# Patient Record
Sex: Female | Born: 1955 | Race: White | Hispanic: No | Marital: Single
Health system: Midwestern US, Community
[De-identification: ages and names within clinical notes are randomized; demographics above are authoritative.]

## PROBLEM LIST (undated history)

## (undated) ENCOUNTER — Emergency Department (HOSPITAL_COMMUNITY)
Admission: EM | Discharge status: Absent without leave | Payer: Self-pay | Attending: Emergency Medicine | Admitting: Emergency Medicine

## (undated) DIAGNOSIS — Z9889 Other specified postprocedural states: Secondary | ICD-10-CM

## (undated) DIAGNOSIS — I8390 Asymptomatic varicose veins of unspecified lower extremity: Secondary | ICD-10-CM

## (undated) DIAGNOSIS — I2699 Other pulmonary embolism without acute cor pulmonale: Secondary | ICD-10-CM

## (undated) DIAGNOSIS — Z8719 Personal history of other diseases of the digestive system: Secondary | ICD-10-CM

## (undated) DIAGNOSIS — M1990 Unspecified osteoarthritis, unspecified site: Secondary | ICD-10-CM

## (undated) DIAGNOSIS — E559 Vitamin D deficiency, unspecified: Secondary | ICD-10-CM

## (undated) DIAGNOSIS — D6862 Lupus anticoagulant syndrome: Secondary | ICD-10-CM

## (undated) DIAGNOSIS — J45909 Unspecified asthma, uncomplicated: Secondary | ICD-10-CM

## (undated) DIAGNOSIS — I10 Essential (primary) hypertension: Secondary | ICD-10-CM

## (undated) DIAGNOSIS — E079 Disorder of thyroid, unspecified: Secondary | ICD-10-CM

## (undated) DIAGNOSIS — I82409 Acute embolism and thrombosis of unspecified deep veins of unspecified lower extremity: Secondary | ICD-10-CM

## (undated) DIAGNOSIS — D649 Anemia, unspecified: Secondary | ICD-10-CM

## (undated) DIAGNOSIS — K219 Gastro-esophageal reflux disease without esophagitis: Secondary | ICD-10-CM

## (undated) DIAGNOSIS — I739 Peripheral vascular disease, unspecified: Secondary | ICD-10-CM

## (undated) DIAGNOSIS — G709 Myoneural disorder, unspecified: Secondary | ICD-10-CM

## (undated) DIAGNOSIS — M533 Sacrococcygeal disorders, not elsewhere classified: Secondary | ICD-10-CM

## (undated) DIAGNOSIS — K439 Ventral hernia without obstruction or gangrene: Secondary | ICD-10-CM

## (undated) DIAGNOSIS — E039 Hypothyroidism, unspecified: Secondary | ICD-10-CM

## (undated) DIAGNOSIS — F419 Anxiety disorder, unspecified: Secondary | ICD-10-CM

## (undated) DIAGNOSIS — G8929 Other chronic pain: Secondary | ICD-10-CM

## (undated) DIAGNOSIS — G4730 Sleep apnea, unspecified: Secondary | ICD-10-CM

## (undated) DIAGNOSIS — M549 Dorsalgia, unspecified: Secondary | ICD-10-CM

## (undated) DIAGNOSIS — C55 Malignant neoplasm of uterus, part unspecified: Secondary | ICD-10-CM

## (undated) DIAGNOSIS — R928 Other abnormal and inconclusive findings on diagnostic imaging of breast: Secondary | ICD-10-CM

## (undated) DIAGNOSIS — R921 Mammographic calcification found on diagnostic imaging of breast: Secondary | ICD-10-CM

## (undated) DIAGNOSIS — Z1239 Encounter for other screening for malignant neoplasm of breast: Secondary | ICD-10-CM

## (undated) HISTORY — PX: ESOPHAGOGASTRODUODENOSCOPY (EGD) WITH ESOPHAGEAL DILATION: SHX5812

## (undated) HISTORY — PX: APPENDECTOMY: SHX54

## (undated) HISTORY — DX: Asymptomatic varicose veins of unspecified lower extremity: I83.90

## (undated) HISTORY — PX: JOINT REPLACEMENT: SHX530

## (undated) HISTORY — PX: DILATION AND CURETTAGE OF UTERUS: SHX78

## (undated) HISTORY — PX: BUNIONECTOMY WITH HAMMERTOE RECONSTRUCTION: SHX5600

## (undated) HISTORY — PX: TONSILLECTOMY: SUR1361

---

## 2003-03-23 ENCOUNTER — Encounter: Admission: RE | Admit: 2003-03-23 | Discharge: 2003-03-23 | Payer: Self-pay | Admitting: Family Medicine

## 2003-03-23 ENCOUNTER — Encounter: Payer: Self-pay | Admitting: Family Medicine

## 2003-05-13 ENCOUNTER — Ambulatory Visit (HOSPITAL_BASED_OUTPATIENT_CLINIC_OR_DEPARTMENT_OTHER): Admission: RE | Admit: 2003-05-13 | Discharge: 2003-05-13 | Payer: Self-pay | Admitting: Otolaryngology

## 2005-01-08 ENCOUNTER — Encounter: Admission: RE | Admit: 2005-01-08 | Discharge: 2005-04-08 | Payer: Self-pay | Admitting: Family Medicine

## 2005-01-13 ENCOUNTER — Ambulatory Visit (HOSPITAL_COMMUNITY): Admission: RE | Admit: 2005-01-13 | Discharge: 2005-01-13 | Payer: Self-pay | Admitting: Family Medicine

## 2005-02-12 ENCOUNTER — Ambulatory Visit: Payer: Self-pay | Admitting: Gastroenterology

## 2005-03-09 ENCOUNTER — Ambulatory Visit: Payer: Self-pay | Admitting: Gastroenterology

## 2005-03-13 ENCOUNTER — Ambulatory Visit: Payer: Self-pay | Admitting: Gastroenterology

## 2005-09-29 ENCOUNTER — Emergency Department (HOSPITAL_COMMUNITY): Admission: EM | Admit: 2005-09-29 | Discharge: 2005-09-29 | Payer: Self-pay | Admitting: Emergency Medicine

## 2006-03-18 ENCOUNTER — Ambulatory Visit (HOSPITAL_COMMUNITY): Admission: RE | Admit: 2006-03-18 | Discharge: 2006-03-18 | Payer: Self-pay | Admitting: Family Medicine

## 2006-03-19 ENCOUNTER — Ambulatory Visit (HOSPITAL_COMMUNITY): Admission: RE | Admit: 2006-03-19 | Discharge: 2006-03-19 | Payer: Self-pay | Admitting: Family Medicine

## 2007-04-28 ENCOUNTER — Encounter: Admission: RE | Admit: 2007-04-28 | Discharge: 2007-04-28 | Payer: Self-pay | Admitting: Family Medicine

## 2010-05-24 ENCOUNTER — Encounter

## 2010-07-03 LAB — COMPREHENSIVE METABOLIC PANEL
ALT: 26 U/L (ref 4–40)
AST: 24 U/L (ref 8–36)
Albumin/Globulin Ratio: 1.8 (ref 1.0–2.7)
Albumin: 4.8 g/dL (ref 3.4–4.8)
Alkaline Phosphatase: 79 U/L (ref 25–100)
Anion Gap: 15 mmol/L (ref 8–16)
BUN: 11 mg/dL (ref 6–20)
CO2: 26 mmol/L (ref 20–31)
Calcium: 9.6 mg/dL (ref 8.6–10.4)
Chloride: 105 mmol/L (ref 98–110)
Creatinine: 0.8 mg/dL (ref 0.4–1.0)
GFR African American: >60 mL/min (ref >60)
GFR Non-African American: >60 mL/min (ref >60)
Glucose: 84 mg/dL (ref 74–106)
Potassium: 4.1 mmol/L (ref 3.5–5.1)
Protein, Total: 7.4 g/dL (ref 6.4–8.3)
Sodium: 141 mmol/L (ref 136–145)
Total Bilirubin: 0.26 mg/dL — ABNORMAL LOW (ref 0.3–1.2)

## 2010-07-03 LAB — CBC
Hematocrit: 41.6 % (ref 36–46)
Hemoglobin: 14 g/dL (ref 12.0–16.0)
MCH: 29 pg (ref 26–34)
MCHC: 33.6 g/dL (ref 31–37)
MCV: 86.3 fL (ref 80–100)
MPV: 9.1 fL (ref 6.0–12.0)
Platelet Count: 388 10*3/uL (ref 140–450)
RBC: 4.82 m/uL (ref 4.0–5.2)
RDW: 16 % — ABNORMAL HIGH (ref 12.5–15.4)
WBC: 9.7 10*3/uL (ref 3.5–11.0)

## 2010-07-03 LAB — HEMOGLOBIN A1C
Estimated Avg Glucose: 134 mg/dL
Hemoglobin A1C: 6.3 % — ABNORMAL HIGH (ref 4.0–6.0)

## 2010-07-03 LAB — STREP A CULTURE, THROAT: Culture: NEGATIVE

## 2010-07-03 LAB — LIPID PANEL
Chol/HDL Ratio: 5.1 — ABNORMAL HIGH (ref <5.0)
Cholesterol: 228 mg/dL — ABNORMAL HIGH (ref <200)
HDL: 45 mg/dL (ref >40)
LDL Cholesterol: 146 mg/dL — ABNORMAL HIGH (ref <100)
Triglycerides: 183 mg/dL — ABNORMAL HIGH (ref <150)

## 2010-07-21 ENCOUNTER — Encounter

## 2010-07-21 MED ORDER — HYDROCODONE-ACETAMINOPHEN 5-500 MG PO TABS
5-500 MG | ORAL_TABLET | ORAL | Status: DC
Start: 2010-07-21 — End: 2010-08-04

## 2010-07-22 NOTE — Telephone Encounter (Signed)
Nurse tried to  Phone  Pt  Home  Phone  And  Number was  D"c    Pt  Has no  Other  Number  Listed   To  Contact  Rx  For  vicodin  Faxed to  [facility]  [phone]

## 2010-08-14 NOTE — Telephone Encounter (Signed)
Paper chart requested check wire basket next appt  08-19-10

## 2010-08-19 MED ORDER — HYDROCODONE-ACETAMINOPHEN 5-500 MG PO TABS
5-500 MG | ORAL_TABLET | ORAL | Status: AC
Start: 2010-08-19 — End: ?

## 2010-08-19 MED ORDER — CLONAZEPAM 1 MG PO TABS
1 MG | ORAL_TABLET | Freq: Every evening | ORAL | Status: AC | PRN
Start: 2010-08-19 — End: 2011-08-14

## 2010-08-21 NOTE — Telephone Encounter (Signed)
Rx konopin written by Dr Otis Dials and called to [facility].

## 2010-08-22 NOTE — Telephone Encounter (Signed)
Rx vicodin written by Dr Otis Dials and called to [facility] message line.

## 2011-06-15 ENCOUNTER — Inpatient Hospital Stay (INDEPENDENT_AMBULATORY_CARE_PROVIDER_SITE_OTHER)
Admission: RE | Admit: 2011-06-15 | Discharge: 2011-06-15 | Disposition: A | Discharge status: Home / Self Care | Payer: Self-pay | Source: Ambulatory Visit | Attending: Family Medicine | Admitting: Family Medicine

## 2011-06-15 DIAGNOSIS — E039 Hypothyroidism, unspecified: Secondary | ICD-10-CM

## 2011-09-12 ENCOUNTER — Emergency Department (INDEPENDENT_AMBULATORY_CARE_PROVIDER_SITE_OTHER)
Admission: EM | Admit: 2011-09-12 | Discharge: 2011-09-12 | Disposition: A | Discharge status: Home / Self Care | Payer: Self-pay | Source: Home / Self Care | Attending: Family Medicine | Admitting: Family Medicine

## 2011-09-12 ENCOUNTER — Encounter: Payer: Self-pay | Admitting: Emergency Medicine

## 2011-09-12 DIAGNOSIS — E039 Hypothyroidism, unspecified: Secondary | ICD-10-CM

## 2011-09-12 HISTORY — DX: Disorder of thyroid, unspecified: E07.9

## 2011-09-12 MED ORDER — LEVOTHYROXINE SODIUM 50 MCG PO TABS: 50.0000 ug | ORAL_TABLET | Freq: Every day | ORAL | Status: DC

## 2011-09-12 NOTE — ED Notes (Signed)
Patient reports no insurance , needing refill of thyroid medicine.

## 2011-09-12 NOTE — ED Notes (Signed)
No pcp, last pcp was [facility]

## 2011-09-12 NOTE — ED Provider Notes (Signed)
History     CSN: [id number]  Arrival date & time 09/12/11  1223   First MD Initiated Contact with Patient 09/12/11 1508      Chief Complaint  Patient presents with  . Medication Refill    (Consider location/radiation/quality/duration/timing/severity/associated sxs/prior treatment) HPI Comments: Pt here for refill of synthroid, ,last seen in oct, states has no ins or doctor for follow-up, works PT.  The history is provided by the patient.    Past Medical History  Diagnosis Date  . Thyroid disease     Past Surgical History  Procedure Date  . Bunionectomy     No family history on file.  History  Substance Use Topics  . Smoking status: Never Smoker   . Smokeless tobacco: Not on file  . Alcohol Use: No    OB History    Grav Para Term Preterm Abortions TAB SAB Ect Mult Living                  Review of Systems  Constitutional: Negative.   HENT: Negative for neck pain and neck stiffness.   Cardiovascular: Negative for palpitations.    Allergies  Food and Penicillins  Home Medications   Current Outpatient Rx  Name Route Sig Dispense Refill  . LEVOTHYROXINE SODIUM 50 MCG PO TABS Oral Take 50 mcg by mouth daily.      Marland Kitchen LEVOTHYROXINE SODIUM 50 MCG PO TABS Oral Take 1 tablet (50 mcg total) by mouth daily. 90 tablet 0    BP 151/83  Pulse 78  Temp(Src) 97.7 F (36.5 C) (Oral)  Resp 18  SpO2 98%  Physical Exam  Nursing note and vitals reviewed. Constitutional: She is oriented to person, place, and time. She appears well-developed and well-nourished.  HENT:  Head: Normocephalic.  Neck: Normal range of motion. Neck supple. No thyromegaly present.  Neurological: She is alert and oriented to person, place, and time.  Skin: Skin is warm and dry.    ED Course  Procedures (including critical care time)  Labs Reviewed - No data to display No results found.   1. Hypothyroidism       MDM  tsh in oct was wnl. For same dose.        Barkley Bruns, MD 09/12/11 [phone]

## 2013-06-13 LAB — COMPREHENSIVE METABOLIC PANEL
ALT: 12 U/L (ref 5–33)
AST: 12 U/L (ref <32)
Albumin/Globulin Ratio: 1.6 (ref 1.0–2.5)
Albumin: 4.6 g/dL (ref 3.5–5.2)
Alkaline Phosphatase: 73 U/L (ref 35–104)
Anion Gap: 13 mmol/L (ref 8–16)
BUN: 14 mg/dL (ref 6–20)
CO2: 28 mmol/L (ref 20–31)
Calcium: 9.4 mg/dL (ref 8.6–10.4)
Chloride: 101 mmol/L (ref 98–107)
Creatinine: 0.73 mg/dL (ref 0.50–0.90)
GFR African American: >60 mL/min (ref >60)
GFR Non-African American: >60 mL/min (ref >60)
Glucose: 91 mg/dL (ref 70–99)
Potassium: 3.3 mmol/L — ABNORMAL LOW (ref 3.5–5.1)
Sodium: 139 mmol/L (ref 133–142)
Total Bilirubin: 0.2 mg/dL — ABNORMAL LOW (ref 0.3–1.2)
Total Protein: 7.5 g/dL (ref 6.4–8.3)

## 2013-06-13 LAB — CBC
Hematocrit: 41.9 % (ref 36–46)
Hemoglobin: 14.4 g/dL (ref 12.0–16.0)
MCH: 30.1 pg (ref 26–34)
MCHC: 34.4 g/dL (ref 31–37)
MCV: 87.5 fL (ref 80–100)
MPV: 9.7 fL (ref 6.0–12.0)
Platelets: 291 10*3/uL (ref 140–450)
RBC: 4.79 m/uL (ref 4.0–5.2)
RDW: 14 % (ref 12.5–15.4)
WBC: 10.8 10*3/uL (ref 3.5–11.0)

## 2013-06-13 LAB — CBC WITH AUTO DIFFERENTIAL
Absolute Eos #: 0.2 10*3/uL (ref 0.0–0.4)
Absolute Lymph #: 4.1 10*3/uL (ref 1.0–4.8)
Absolute Mono #: 0.7 10*3/uL (ref 0.1–1.2)
Basophils Absolute: 0.2 10*3/uL (ref 0.0–0.2)
Basophils: 2 % (ref 0–2)
Eosinophils %: 2 % (ref 1–4)
Hematocrit: 41.9 % (ref 36–46)
Hemoglobin: 14.4 g/dL (ref 12.0–16.0)
Lymphocytes: 38 % (ref 24–44)
MCH: 30.1 pg (ref 26–34)
MCHC: 34.4 g/dL (ref 31–37)
MCV: 87.5 fL (ref 80–100)
MPV: 9.7 fL (ref 6.0–12.0)
Monocytes: 7 % (ref 2–11)
Platelets: 291 10*3/uL (ref 140–450)
RBC: 4.79 m/uL (ref 4.0–5.2)
RDW: 14 % (ref 12.5–15.4)
Seg Neutrophils: 51 % (ref 36–66)
Segs Absolute: 5.6 10*3/uL (ref 1.8–7.7)
WBC: 10.8 10*3/uL (ref 3.5–11.0)

## 2013-06-13 LAB — TSH: TSH: 2.42 mIU/L (ref 0.30–5.00)

## 2013-06-13 LAB — MICROSCOPIC URINALYSIS: WBC, UA: 0 /HPF (ref 0–5)

## 2013-06-13 LAB — LIPID PANEL
Chol/HDL Ratio: 2.8 (ref <5)
Cholesterol: 220 mg/dL — ABNORMAL HIGH (ref <200)
HDL: 80 mg/dL (ref >40)
LDL Cholesterol: 116 mg/dL (ref 0–130)
Triglycerides: 120 mg/dL (ref <150)

## 2013-06-13 LAB — URINALYSIS
Bilirubin Urine: NEGATIVE
Glucose, Ur: NEGATIVE
Ketones, Urine: NEGATIVE
Nitrite, Urine: NEGATIVE
Protein, UA: NEGATIVE
Specific Gravity, UA: 1.022 (ref 1.005–1.030)
Urine Hgb: NEGATIVE
Urobilinogen, Urine: NORMAL
pH, UA: 5.5 (ref 5.0–8.0)

## 2013-06-13 LAB — HEMOGLOBIN A1C
Estimated Avg Glucose: 117 mg/dL
Hemoglobin A1C: 5.7 % (ref 4.0–6.0)

## 2013-06-14 LAB — HIV SCREEN: HIV 1/2 Antibody: NONREACTIVE

## 2013-07-04 ENCOUNTER — Other Ambulatory Visit: Payer: Self-pay | Admitting: Family Medicine

## 2013-07-04 ENCOUNTER — Other Ambulatory Visit (HOSPITAL_COMMUNITY)
Admission: RE | Admit: 2013-07-04 | Discharge: 2013-07-04 | Disposition: A | Discharge status: Home / Self Care | Payer: BC Managed Care – PPO | Source: Ambulatory Visit | Attending: Family Medicine | Admitting: Family Medicine

## 2013-07-04 DIAGNOSIS — Z124 Encounter for screening for malignant neoplasm of cervix: Secondary | ICD-10-CM | POA: Insufficient documentation

## 2013-07-20 NOTE — Discharge Instructions (Signed)
PRE-PROCEDURE INSTRUCTIONS         PROCEDURE _Lumbar Epidural steroid injection x3 2 weeks apart           1.) ON ________________________ ARRIVE _________________        2.) ON ________________________ ARRIVE __________________        3.) ON ________________________ ARRIVE __________________                     FOLLOW UP ________________________ ARRIVE ___________________      1. Shower or bathe day of procedure and wear loose fitting clothing.      2. Do not eat or drink anything for ___4__ hours prior to procedure.      3. Unless otherwise instructed, take medications with a sip of water                                                                          4. If you are a diabetic, check your blood sugar prior to arrival, do not  take diabetic medications but please bring them with you.      5. You must be off the following blood thinners prior to procedure date: Please do not hold any blood thinners until we have directed you to do so.          a. Plavix  7 days ___ Coumadin  5 days ___  Platel  7 days___    b. Adult aspirin 325 mg for 14 days ___    c. Baby aspirin 81 mg for 7 days ___    d. Any anti-inflammatory medications like Ibuprofen, Aleve, Voltaren, and Mobic for 7 days x___    e. Other blood thinner____________        6. You must be in the lab for pre-procedure lab work on _______ at______    Please bring order slip  with you.      7. You must arrange for someone to drive you home after your procedure. The procedure will not be performed unless this arrangement is made.      8. Additional instructions_____________________________________       If any problems call [phone], after hours call [phone] and request Dr Nat Christen be paged through the answering service.    Order given to pt. For physical therapy, behavorial therapy.  Must sign release of information to [facility] for behavioral therapy.

## 2013-07-20 NOTE — Progress Notes (Signed)
[name]  Patient Pain Assessment  Consultation - [name], MD    Primary Care Physician: Joelene Millin, DO    Chief complaint:   Chief Complaint   Patient presents with   ??? Back Pain     bilat. legs   .    HISTORY OF PRESENT ILLNESS:  Sandra Gonzalez is 57 y.o. female with    Back Pain  This is a chronic problem. The current episode started more than 1 year ago. The problem occurs daily. The problem has been gradually worsening since onset. The pain is present in the lumbar spine. The quality of the pain is described as shooting and stabbing. The pain radiates to the right foot and left foot. The pain is at a severity of 10/10. The pain is severe. The symptoms are aggravated by twisting. Associated symptoms include headaches. Pertinent negatives include no bowel incontinence, chest pain, tingling or weakness. Risk factors include sedentary lifestyle and lack of exercise. She has tried analgesics, NSAIDs and home exercises for the symptoms. The treatment provided mild relief.         OARRS compliant? yes  Concern for prescription abuse? Not applicable     Current Pain Assement  Pain Assessment  Pain Assessment: 0-10  Pain Level: 4  Pain Type: Chronic pain  Pain Location: Back, Leg  Pain Orientation: Lower  Pain Radiating Towards:  (bilat. legs, right foot worse)  Pain Descriptors: Constant, Numbness, Dull, Sharp, Shooting (right foot numbness)  Pain Frequency: Continuous  Clinical Progression: Gradually worsening  Patient's Stated Pain Goal: 3  Pain Intervention(s): Medication (see eMar), Rest, Repositioned       Past Medical History      Diagnosis Date   ??? Arthritis    ??? Cancer (HCC)      skin, basal cell above lip   ??? Headache        Surgical History  Past Surgical History   Procedure Laterality Date   ??? Cesarean section     ??? Foot fracture surgery     ??? Tonsillectomy         Medications  Current Outpatient Prescriptions   Medication Sig Dispense Refill   ???  HYDROcodone-acetaminophen (NORCO) 7.5-325 MG per tablet Take 2 tablets by mouth every 8 hours as needed for Pain.       ??? carisoprodol (SOMA) 350 MG tablet Take 350 mg by mouth 4 times daily as needed for Muscle spasms.       ??? clonazepam (KLONOPIN) 1 MG tablet Take 1 mg by mouth 2 times daily as needed.         ??? trazodone (DESYREL) 100 MG tablet Take 100 mg by mouth nightly. May still be using 50 mg        ??? ibuprofen (ADVIL;MOTRIN) 800 MG tablet Take 800 mg by mouth every 8 hours as needed. Not sure if using        ??? hydrocodone-acetaminophen (VICODIN) 5-500 MG per tablet TAKE 1 TABLET BY MOUTH TWICE DAILY AS NEEDED FOR PAIN  50 tablet  0   ??? clonazepam (KLONOPIN) 1 MG tablet Take 1 tablet by mouth nightly as needed for Anxiety.  60 tablet  1   ??? cyclobenzaprine (FLEXERIL) 10 MG tablet Take 10 mg by mouth daily.         ??? verapamil (CALAN) 40 MG tablet Take 40 mg by mouth 3 times daily.         ??? cetirizine (  ZYRTEC) 10 MG tablet Take 10 mg by mouth daily.         ??? hydrochlorothiazide (HYDRODIURIL) 25 MG tablet Take 25 mg by mouth daily. Not sure if using        ??? metformin (GLUCOPHAGE) 850 MG tablet Take 850 mg by mouth 2 times daily (with meals).         ??? omeprazole (PRILOSEC) 20 MG capsule Take 20 mg by mouth daily. Not sure if taking        ??? triamcinolone (KENALOG) 0.1 % cream Apply  topically 2 times daily. Not sure if using        ??? dicyclomine (BENTYL) 10 MG capsule Take 10 mg by mouth 3 times daily as needed.           No current facility-administered medications for this encounter.       Allergies  Review of patient's allergies indicates no known allergies.    Family History  family history includes Heart Attack in her mother.    Social History  History     Social History   ??? Marital Status: Single     Spouse Name: N/A     Number of Children: N/A   ??? Years of Education: N/A     Occupational History   ??? disability      Social History Main Topics   ??? Smoking status: Never Smoker    ??? Smokeless tobacco:  Never Used   ??? Alcohol Use: No   ??? Drug Use: No   ??? Sexual Activity: None     Other Topics Concern   ??? None     Social History Narrative      reports that she does not use illicit drugs.         ADVERSE MEDICATION EFFECTS:   Constipation: yes  Bowel Regimen: Yes  Diet: common adult  Sedation:  no  Urinary Retention: no    FOCUSED PAIN SCALE:  Highest : 10  Lowest :4  Average: Range-7    ACTIVITY/SOCIAL/EMOTIONAL:  Sleep Pattern: 4 hours per night. nightime awakenings and difficulty falling back asleep if awakened  Home Exercises: stretching  daily, uses inversion table  Currently attending Physical Therapy:  No  When and What  was your last procedure:    N/A  Was your procedure effective:  not applicable  Emotional Issues: anxiety/ nervousness and tearfulness.   Currently seeing a Psychiatrist or Psychologist:  No  Appetite:  loss of appetite loss over 70 lbs. Since 2011  Energy Level:  Tired/Fatigued  Mobility:painful w/walking less than 1 block    Mood: depressed, tearful and flat affect, depressed for several months, only receives $189.00 a month to live    ABERRANT BEHAVIORS SINCE LAST VISIT:  Have you ever been treated in another Pain Clinic no  Refills for prescriptions appropriate: yes  Lost rx/pills: no  Taking more medication than prescribed:  Yes, soma occ.  Are you receiving PAIN medications from  other doctors: yes, Norco on evaluation day  Urine Drug Screen or pill count compliant:  not applicable  Brought pill bottles in :not applicable  Recent ER visits: No    REVIEW OF SYSTEMS:  Review of Systems   Constitutional: Negative.    HENT: Positive for sore throat.    Eyes: Positive for blurred vision.   Respiratory: Negative.    Cardiovascular: Negative.  Negative for chest pain.   Gastrointestinal: Negative.  Negative for bowel incontinence.   Musculoskeletal: Positive for back  pain. Negative for falls.   Skin: Positive for itching.   Neurological: Positive for headaches. Negative for dizziness, tingling,  seizures and weakness.   Endo/Heme/Allergies: Negative.    Psychiatric/Behavioral: Positive for depression. Negative for suicidal ideas. The patient is nervous/anxious and has insomnia.             GENERAL PHYSICAL EXAM:  Vitals: Pulse 76   Temp(Src) 97.9 ??F (36.6 ??C) (Oral)   Resp 17   Ht 5\' 6"  (1.676 m)   Wt 140 lb (63.504 kg)   BMI 22.61 kg/m2   SpO2 98%, Body mass index is 22.61 kg/(m^2).  GENERAL APPEARANCE: Appears well nourished and in no acute distress and not in severe pain at this time.   SKIN: Warm, dry, no cyanosis or jaundice.   HEAD: Normocephalic, atraumatic, no swelling or tenderness.   EARS: No discharge, no marked hearing loss.   NOSE: No rhinorrhea, epistaxis.   NECK: No stiffness, trachea central. No palpable masses or L.N.   CHEST: Symmetrical and equal on expansion.   HEART:  No audible murmurs or gallops.   LUNGS: Equal on expansion, normal breath sounds. No adventitious sounds.   ABDOMEN: Soft on palpation. No localized tenderness. No guarding or rigidity.   LYMPHATICS: No palpable cervical lymphadenopathy.   LOCOMOTOR, BACK/ SPINE and EXTREMITIES: Left lumbar facet tenderness, worse with facet loading, unchanged with spine flexion and extension.  Left SI pain with Patrick's test.  Positive straight leg raise bilaterally.  NEUROLOGICAL:  Sensory and motor intact in all extremities.  Romberg negative.  patient gait is minimally antalgic her spinous processes are in the midline without gross deviations alignment of the shoulder scapula and iliac crests are normal.        DATA  No relevant labs.  Urine screen pending.    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  L4/L5:  Broad based disc bulge with mild central canal stenosis and mild right neural foraminal narrowing.    ASSESSMENT  CHARNETTE YOUNKIN is a 57 y.o. female with  1. Lumbar radiculitis    2.  Sacroiliitis (HCC)    3. Depression    4. Chronic pain    5. Lumbar spondylosis    6. Lumbar facet joint pain    7. Low back pain    8. Anxiety state         PLAN  Physical and aquatic therapy ordered.  History of THC use and depression, behavioral health evaluation for potential narcotic therapy.     Patient's   []  x-ray    []  CT scan    [x]  MRI  Were/was  Reviewed.These findings are consistent with the patient's symptoms and physical examination.      []  No x-ray reports are available at this time    [x]  Patient's findings on the MRI were explained to the patient using a bone modal.    Other reports reviewed include    []  Bone scan   []  EMG and nerve conduction studies   []  Referral reports-  I also discussed with him the following treatment options Including advantages and disadvantages of each:    [x]  Physical therapy    [x]  Interventional pain treatment    [x]  Medication management    [x]  Surgical options    Patient's OARRS were reviewed. It is acceptable and appears patient is not receiving prescriptions from multiple prescribers.  Patient is forthcoming regarding prescriptions for pain medication in the past    The following screens were also reviewed  SOAPP- the score is 5. (Values: patient is  <33minimal potential  4-7 Moderate potential  >7 High potential  for drug addiction    The following treatment plan was developed after discussion with patient:    We discussed Lumbar Epidural steroid Injections x 3  at L4 - L5.    Patient tried and failed NSAIDS,Home exercises, Physical Therapy without relief.    Patient has not had prior Lumbar Surgery.    Patient will consider injections and call to schedule an appointment if she elects to proceed with the LESIs.  If she does, we will see the patient in 2 weeks after the procedures and re-evaluate symptoms.  If she declines LESIs, we will reevaluate her after her behavioral health evaluation.       Electronically signed by Landis Gandy, MD on 07/20/2013 at  3:15 PM

## 2013-07-21 LAB — URINE DRUG SCREEN
Amphetamine Screen, Ur: NEGATIVE
Barbiturate Screen, Ur: NEGATIVE
Benzodiazepine Screen, Urine: POSITIVE — AB
Cannabinoid Scrn, Ur: NEGATIVE
Cocaine Metabolite, Urine: NEGATIVE
Methadone Screen, Urine: NEGATIVE
Opiates, Urine: POSITIVE — AB
Oxycodone Screen, Ur: NEGATIVE
Phencyclidine, Urine: NEGATIVE

## 2013-07-24 LAB — OPIATE, QUANTITATIVE, URINE
Codeine, Urine: <20 ng/mL
Hydrocodone, Urine Confirmation: >4000 ng/mL
Hydromorphone, Urine Confirmation: 31 ng/mL
Morphine, Urine Confirmation: <20 ng/mL
Norhydrocodone, Urine: >4000 ng/mL
Noroxycodone, Urine: <20 ng/mL
Noroxymorphone, Urine: <20 ng/mL
Opiate, 6-AM Urine: <20 ng/mL
Opiate, Urine Interpretation: POSITIVE
Oxycodone, Urine Confirmation: <20 ng/mL
Oxymorphone, Urine Confirmation: <20 ng/mL

## 2013-07-25 ENCOUNTER — Encounter (HOSPITAL_COMMUNITY): Payer: Self-pay | Admitting: *Deleted

## 2013-07-25 ENCOUNTER — Encounter (HOSPITAL_COMMUNITY): Payer: Self-pay | Admitting: Pharmacy Technician

## 2013-07-25 LAB — BENZODIAZEPINE, QUANTITATIVE, URINE
2-OHethylfluraz, Urine: <20 ng/mL
Alprazolam: <5 ng/mL
Benzodiazepine Conf Ur: POSITIVE
Chlordiazepoxide: <20 ng/mL
Clonazepam, Urine Screen: 31 ng/mL
DESALKYLFLURAZEPAM: <20 ng/mL
Diazepam UR Quant: <20 ng/mL
Lorazepam: <20 ng/mL
Midazolam, Urine Screen: <20 ng/mL
Midazolam, Urine Screen: <20 ng/mL
Nordiazepam Urine Screen: <20 ng/mL
OH-Alprazolam Screen: <5 ng/mL
Oxazepam Urine Screen: <20 ng/mL
Prazepam,Ur: <20 ng/mL
Temazepam, Urine Screen: <20 ng/mL
Triazolam, Urine Screen: <20 ng/mL
Urine 7-Aminoclonazepam: 997 ng/mL
Zolpidem Level: <20 ng/mL

## 2013-07-25 LAB — CANNABINOID, URINE, CONFIRMATION: Marijuana Confirmation Urine: 12 ng/mL

## 2013-08-01 ENCOUNTER — Other Ambulatory Visit: Payer: Self-pay | Admitting: Gastroenterology

## 2013-08-01 NOTE — Addendum Note (Signed)
Addended by: Willis Modena on: 08/01/2013 10:29 AM   Modules accepted: Orders

## 2013-08-01 NOTE — Telephone Encounter (Signed)
Result of urine drug test collected at last office visit not as anticipated.  Letter from Dr Nat Christen sent certified mail to pt. [employer] fund[employer]

## 2013-08-02 ENCOUNTER — Encounter (HOSPITAL_COMMUNITY): Payer: BC Managed Care – PPO | Admitting: Anesthesiology

## 2013-08-02 ENCOUNTER — Encounter (HOSPITAL_COMMUNITY): Payer: Self-pay | Admitting: Anesthesiology

## 2013-08-02 ENCOUNTER — Encounter (HOSPITAL_COMMUNITY)
Admission: RE | Disposition: A | Discharge status: Home / Self Care | Payer: Self-pay | Source: Ambulatory Visit | Attending: Gastroenterology

## 2013-08-02 ENCOUNTER — Ambulatory Visit (HOSPITAL_COMMUNITY): Payer: BC Managed Care – PPO | Admitting: Anesthesiology

## 2013-08-02 ENCOUNTER — Ambulatory Visit (HOSPITAL_COMMUNITY)
Admission: RE | Admit: 2013-08-02 | Discharge: 2013-08-02 | Disposition: A | Discharge status: Home / Self Care | Payer: BC Managed Care – PPO | Source: Ambulatory Visit | Attending: Gastroenterology | Admitting: Gastroenterology

## 2013-08-02 DIAGNOSIS — G4730 Sleep apnea, unspecified: Secondary | ICD-10-CM | POA: Insufficient documentation

## 2013-08-02 DIAGNOSIS — R195 Other fecal abnormalities: Secondary | ICD-10-CM | POA: Insufficient documentation

## 2013-08-02 DIAGNOSIS — K449 Diaphragmatic hernia without obstruction or gangrene: Secondary | ICD-10-CM | POA: Insufficient documentation

## 2013-08-02 DIAGNOSIS — K5900 Constipation, unspecified: Secondary | ICD-10-CM | POA: Insufficient documentation

## 2013-08-02 DIAGNOSIS — R1310 Dysphagia, unspecified: Secondary | ICD-10-CM | POA: Insufficient documentation

## 2013-08-02 DIAGNOSIS — E039 Hypothyroidism, unspecified: Secondary | ICD-10-CM | POA: Insufficient documentation

## 2013-08-02 DIAGNOSIS — K219 Gastro-esophageal reflux disease without esophagitis: Secondary | ICD-10-CM | POA: Insufficient documentation

## 2013-08-02 DIAGNOSIS — D126 Benign neoplasm of colon, unspecified: Secondary | ICD-10-CM | POA: Insufficient documentation

## 2013-08-02 DIAGNOSIS — K644 Residual hemorrhoidal skin tags: Secondary | ICD-10-CM | POA: Insufficient documentation

## 2013-08-02 DIAGNOSIS — K222 Esophageal obstruction: Secondary | ICD-10-CM | POA: Insufficient documentation

## 2013-08-02 DIAGNOSIS — K648 Other hemorrhoids: Secondary | ICD-10-CM | POA: Insufficient documentation

## 2013-08-02 HISTORY — DX: Gastro-esophageal reflux disease without esophagitis: K21.9

## 2013-08-02 HISTORY — DX: Personal history of other diseases of the digestive system: Z87.19

## 2013-08-02 HISTORY — DX: Sleep apnea, unspecified: G47.30

## 2013-08-02 HISTORY — PX: ESOPHAGOGASTRODUODENOSCOPY (EGD) WITH PROPOFOL: SHX5813

## 2013-08-02 HISTORY — DX: Hypothyroidism, unspecified: E03.9

## 2013-08-02 HISTORY — PX: BALLOON DILATION: SHX5330

## 2013-08-02 HISTORY — PX: COLONOSCOPY WITH PROPOFOL: SHX5780

## 2013-08-02 SURGERY — ESOPHAGOGASTRODUODENOSCOPY (EGD) WITH PROPOFOL
Anesthesia: Monitor Anesthesia Care

## 2013-08-02 MED ORDER — LIDOCAINE HCL (CARDIAC) 20 MG/ML IV SOLN
INTRAVENOUS | Status: AC
  Filled 2013-08-02: qty 5

## 2013-08-02 MED ORDER — PROPOFOL INFUSION 10 MG/ML OPTIME
INTRAVENOUS | Status: DC | PRN
  Administered 2013-08-02: 140 ug/kg/min via INTRAVENOUS

## 2013-08-02 MED ORDER — BUTAMBEN-TETRACAINE-BENZOCAINE 2-2-14 % EX AERO
INHALATION_SPRAY | CUTANEOUS | Status: DC | PRN
  Administered 2013-08-02: 2 via TOPICAL

## 2013-08-02 MED ORDER — FENTANYL CITRATE 0.05 MG/ML IJ SOLN
INTRAMUSCULAR | Status: AC
  Filled 2013-08-02: qty 2

## 2013-08-02 MED ORDER — FENTANYL CITRATE 0.05 MG/ML IJ SOLN
INTRAMUSCULAR | Status: DC | PRN
  Administered 2013-08-02: 50 ug via INTRAVENOUS

## 2013-08-02 MED ORDER — MIDAZOLAM HCL 5 MG/5ML IJ SOLN
INTRAMUSCULAR | Status: DC | PRN
  Administered 2013-08-02 (×2): 1 mg via INTRAVENOUS

## 2013-08-02 MED ORDER — SODIUM CHLORIDE 0.9 % IV SOLN: INTRAVENOUS | Status: DC

## 2013-08-02 MED ORDER — GLYCOPYRROLATE 0.2 MG/ML IJ SOLN
INTRAMUSCULAR | Status: DC | PRN
  Administered 2013-08-02: .2 mg via INTRAVENOUS

## 2013-08-02 MED ORDER — PROPOFOL 10 MG/ML IV BOLUS
INTRAVENOUS | Status: AC
  Filled 2013-08-02: qty 20

## 2013-08-02 MED ORDER — KETAMINE HCL 10 MG/ML IJ SOLN
INTRAMUSCULAR | Status: DC | PRN
  Administered 2013-08-02: 25 mg via INTRAVENOUS

## 2013-08-02 MED ORDER — MIDAZOLAM HCL 2 MG/2ML IJ SOLN
INTRAMUSCULAR | Status: AC
  Filled 2013-08-02: qty 2

## 2013-08-02 MED ORDER — LIDOCAINE HCL (CARDIAC) 20 MG/ML IV SOLN
INTRAVENOUS | Status: DC | PRN
  Administered 2013-08-02: 100 mg via INTRAVENOUS

## 2013-08-02 MED ORDER — KETAMINE HCL 50 MG/ML IJ SOLN
INTRAMUSCULAR | Status: AC
  Filled 2013-08-02: qty 10

## 2013-08-02 MED ORDER — GLYCOPYRROLATE 0.2 MG/ML IJ SOLN
INTRAMUSCULAR | Status: AC
  Filled 2013-08-02: qty 1

## 2013-08-02 MED ORDER — LACTATED RINGERS IV SOLN
INTRAVENOUS | Status: DC
  Administered 2013-08-02: 1000 mL via INTRAVENOUS

## 2013-08-02 SURGICAL SUPPLY — 24 items

## 2013-08-02 NOTE — Transfer of Care (Signed)
Immediate Anesthesia Transfer of Care Note  Patient: Erin Vasquez  Procedure(s) Performed: Procedure(s): ESOPHAGOGASTRODUODENOSCOPY (EGD) WITH PROPOFOL (N/A) BALLOON DILATION (N/A) COLONOSCOPY WITH PROPOFOL (N/A)  Patient Location: PACU  Anesthesia Type:MAC  Level of Consciousness: awake, alert  and oriented  Airway & Oxygen Therapy: Patient Spontanous Breathing and Patient connected to nasal cannula oxygen  Post-op Assessment: Report given to PACU RN and Post -op Vital signs reviewed and stable  Post vital signs: Reviewed and stable  Complications: No apparent anesthesia complications

## 2013-08-02 NOTE — Op Note (Signed)
[facility] [address] [place], [address]   ENDOSCOPY PROCEDURE REPORT  PATIENT: Erin Vasquez, Erin Vasquez  MR#: [id number] BIRTHDATE: 01/06/1956 , 57  yrs. old GENDER: Female ENDOSCOPIST: Willis Modena, MD REFERRED BY:  Juluis Rainier, M.D. PROCEDURE DATE:  08/02/2013 PROCEDURE:  Esophagogastroduodenoscopy with balloon dilation of esophagus ASA CLASS:     Class III INDICATIONS:  GERD, dysphagia. MEDICATIONS: MAC sedation, administered by CRNA TOPICAL ANESTHETIC: Cetacaine Spray  DESCRIPTION OF PROCEDURE: After the risks benefits and alternatives of the procedure were thoroughly explained, informed consent was obtained.  The Pentax Gastroscope [id number] endoscope was introduced through the mouth and advanced to the second portion of the duodenum. Without limitations.  The instrument was slowly withdrawn as the mucosa was fully examined.     Findings: 5cm hiatal hernia.  Schatzki's ring seen, estimated diameter 15mm, dilated serialy from 16.34mm to 18.109mm with TTS balloon dilating catheter; there was mild bloody show post dilatation.  Remainder of endoscopy to second portion of duodenum was normal.        The scope was then withdrawn from the patient and the procedure completed.  ENDOSCOPIC IMPRESSION:     As above.  RECOMMENDATIONS:     1.  Watch for potential complications of procedure. 2.  Continue current medications. 3.  Follow clinical response to dilation. 4.  Proceed with colonoscopy.  eSigned:  Willis Modena, MD 08/02/2013 9:27 AM   CC:

## 2013-08-02 NOTE — H&P (Signed)
Patient interval history reviewed.  Patient examined again.  There has been no change from documented H/P dated 07/21/13 (scanned into chart from our office) except as documented above.  Assessment:  1.  Dysphagia. 2.  GERD. 3.  Constipation. 4.  Hemoccult-positive stool.  Plan:  1.  Endoscopy with possible esophageal balloon dilatation. 2.  Risks (bleeding, infection, bowel perforation that could require surgery, sedation-related changes in cardiopulmonary systems), benefits (identification and possible treatment of source of symptoms, exclusion of certain causes of symptoms), and alternatives (watchful waiting, radiographic imaging studies, empiric medical treatment) of upper endoscopy (EGD) were explained to patient/family in detail and patient wishes to proceed. 3.  Colonoscopy. 4.  Risks (bleeding, infection, bowel perforation that could require surgery, sedation-related changes in cardiopulmonary systems), benefits (identification and possible treatment of source of symptoms, exclusion of certain causes of symptoms), and alternatives (watchful waiting, radiographic imaging studies, empiric medical treatment) of colonoscopy were explained to patient/family in detail and patient wishes to proceed.

## 2013-08-02 NOTE — Anesthesia Preprocedure Evaluation (Addendum)
Anesthesia Evaluation  Patient identified by MRN, date of birth, ID band Patient awake    Reviewed: Allergy & Precautions, H&P , NPO status , Patient's Chart, lab work & pertinent test results  Airway Mallampati: II TM Distance: <3 FB Neck ROM: Full    Dental no notable dental hx.    Pulmonary neg pulmonary ROS, sleep apnea ,  breath sounds clear to auscultation  Pulmonary exam normal       Cardiovascular negative cardio ROS  Rhythm:Regular Rate:Normal     Neuro/Psych negative neurological ROS  negative psych ROS   GI/Hepatic negative GI ROS, Neg liver ROS, GERD-  ,  Endo/Other  negative endocrine ROSHypothyroidism   Renal/GU negative Renal ROS  negative genitourinary   Musculoskeletal negative musculoskeletal ROS (+)   Abdominal   Peds negative pediatric ROS (+)  Hematology negative hematology ROS (+)   Anesthesia Other Findings   Reproductive/Obstetrics negative OB ROS                          Anesthesia Physical Anesthesia Plan  ASA: III  Anesthesia Plan: MAC   Post-op Pain Management:    Induction: Intravenous  Airway Management Planned: Nasal Cannula  Additional Equipment:   Intra-op Plan:   Post-operative Plan:   Informed Consent: I have reviewed the patients History and Physical, chart, labs and discussed the procedure including the risks, benefits and alternatives for the proposed anesthesia with the patient or authorized representative who has indicated his/her understanding and acceptance.   Dental advisory given  Plan Discussed with: CRNA and Surgeon  Anesthesia Plan Comments:         Anesthesia Quick Evaluation

## 2013-08-02 NOTE — Preoperative (Signed)
Beta Blockers   Reason not to administer Beta Blockers:Not Applicable 

## 2013-08-02 NOTE — Op Note (Signed)
[facility] [address] [place], [address]   COLONOSCOPY PROCEDURE REPORT  PATIENT: Erin Vasquez, Erin Vasquez  MR#: [id number] BIRTHDATE: [birth year] , 57  yrs. old GENDER: Female ENDOSCOPIST: Willis Modena, MD REFERRED RS:[id number] Zachery Dauer, M.D. PROCEDURE DATE:  08/02/2013 PROCEDURE:   Colonoscopy with cold biopsy polypectomy ASA CLASS:   Class III INDICATIONS:hemoccult-positive stool, constipation. MEDICATIONS: MAC sedation, administered by CRNA  DESCRIPTION OF PROCEDURE:   After the risks benefits and alternatives of the procedure were thoroughly explained, informed consent was obtained.  A digital rectal exam revealed external hemorrhoids.   The Pentax Adult Colonscope [id number]  endoscope was introduced through the anus and advanced to the terminal ileum which was intubated for a short distance. No adverse events experienced.   The quality of the prep was good.  The instrument was then slowly withdrawn as the colon was fully examined.     Findings:  External hemorrhoids, otherwise normal digital rectal exam.  Prep quality was good.  No diverticula evident.  3mm transverse colon polyp and 4mm sigmoid colon polyp, both removed with cold biopsy forceps.  Distal 5cm of terminal ileum was normal. No other polyps, masses, vascular ectasias, or inflammatory changes.  Retroflexed view of rectum showed mild internal hemorrhoids, otherwise normal.  .  The scope was withdrawn and the procedure completed.  ENDOSCOPIC IMPRESSION:     As above.  Hemorrhoids highly likely source of hemoccult-positive stool.  RECOMMENDATIONS:     1.  Watch for potential complications of procedure. 2.  Await biopsy results. 3.  High fiber diet. 4.  Repeat colonoscopy in 5-10 years, pending polypectomy results. 5.  Follow-up with [facility] in 6-8 weeks for ongoing management of her dysphagia, GERD and constipation.  eSigned:  Willis Modena, MD 08/02/2013 9:34 AM   cc:

## 2013-08-03 ENCOUNTER — Encounter (HOSPITAL_COMMUNITY): Payer: Self-pay | Admitting: Gastroenterology

## 2013-08-06 NOTE — Anesthesia Postprocedure Evaluation (Signed)
  Anesthesia Post-op Note  Patient: Erin Vasquez  Procedure(s) Performed: Procedure(s) (LRB): ESOPHAGOGASTRODUODENOSCOPY (EGD) WITH PROPOFOL (N/A) BALLOON DILATION (N/A) COLONOSCOPY WITH PROPOFOL (N/A)  Patient Location: PACU  Anesthesia Type: General  Level of Consciousness: awake and alert   Airway and Oxygen Therapy: Patient Spontanous Breathing  Post-op Pain: mild  Post-op Assessment: Post-op Vital signs reviewed, Patient's Cardiovascular Status Stable, Respiratory Function Stable, Patent Airway and No signs of Nausea or vomiting  Last Vitals:  Filed Vitals:   08/02/13 1000  BP: 141/97  Pulse:   Temp:   Resp: 17    Post-op Vital Signs: stable   Complications: No apparent anesthesia complications

## 2013-12-13 ENCOUNTER — Other Ambulatory Visit (HOSPITAL_COMMUNITY)
Admission: RE | Admit: 2013-12-13 | Discharge: 2013-12-13 | Disposition: A | Discharge status: Home / Self Care | Payer: No Typology Code available for payment source | Source: Ambulatory Visit | Attending: Family Medicine | Admitting: Family Medicine

## 2013-12-13 ENCOUNTER — Other Ambulatory Visit: Payer: Self-pay | Admitting: Family Medicine

## 2013-12-13 DIAGNOSIS — Z124 Encounter for screening for malignant neoplasm of cervix: Secondary | ICD-10-CM | POA: Insufficient documentation

## 2013-12-22 ENCOUNTER — Other Ambulatory Visit: Payer: Self-pay | Admitting: Obstetrics & Gynecology

## 2014-01-12 DIAGNOSIS — C55 Malignant neoplasm of uterus, part unspecified: Secondary | ICD-10-CM

## 2014-01-12 HISTORY — DX: Malignant neoplasm of uterus, part unspecified: C55

## 2014-01-12 HISTORY — PX: ABDOMINAL HYSTERECTOMY: SHX81

## 2014-01-23 HISTORY — PX: ROBOTIC ASSISTED TOTAL HYSTERECTOMY WITH BILATERAL SALPINGO OOPHERECTOMY: SHX6086

## 2014-03-06 LAB — URINE DRUG SCREEN
Amphetamine Screen, Ur: NEGATIVE
Barbiturate Screen, Ur: NEGATIVE
Benzodiazepine Screen, Urine: POSITIVE — AB
Cannabinoid Scrn, Ur: NEGATIVE
Cocaine Metabolite, Urine: NEGATIVE
Methadone Screen, Urine: NEGATIVE
Opiates, Urine: POSITIVE — AB
Oxycodone Screen, Ur: NEGATIVE
Phencyclidine, Urine: NEGATIVE

## 2014-03-06 LAB — COMPREHENSIVE METABOLIC PANEL
ALT: 13 U/L (ref 5–33)
AST: 18 U/L (ref <32)
Albumin/Globulin Ratio: 1.6 (ref 1.0–2.5)
Albumin: 4.6 g/dL (ref 3.5–5.2)
Alkaline Phosphatase: 68 U/L (ref 35–104)
Anion Gap: 19 mmol/L — ABNORMAL HIGH (ref 8–16)
BUN: 14 mg/dL (ref 6–20)
CO2: 23 mmol/L (ref 20–31)
Calcium: 9.7 mg/dL (ref 8.6–10.4)
Chloride: 101 mmol/L (ref 98–107)
Creatinine: 0.64 mg/dL (ref 0.50–0.90)
GFR African American: >60 mL/min (ref >60)
GFR Non-African American: >60 mL/min (ref >60)
Glucose: 92 mg/dL (ref 70–99)
Potassium: 4.1 mmol/L (ref 3.7–5.3)
Sodium: 139 mmol/L (ref 135–144)
Total Bilirubin: 0.29 mg/dL — ABNORMAL LOW (ref 0.3–1.2)
Total Protein: 7.5 g/dL (ref 6.4–8.3)

## 2014-03-06 LAB — CBC WITH AUTO DIFFERENTIAL
Absolute Eos #: 0.2 10*3/uL (ref 0.0–0.4)
Absolute Lymph #: 4.3 10*3/uL (ref 1.0–4.8)
Absolute Mono #: 0.6 10*3/uL (ref 0.1–1.2)
Basophils Absolute: 0.2 10*3/uL (ref 0.0–0.2)
Basophils: 2 % (ref 0–2)
Eosinophils %: 2 % (ref 1–4)
Hematocrit: 41.8 % (ref 36–46)
Hemoglobin: 13.6 g/dL (ref 12.0–16.0)
Lymphocytes: 44 % (ref 24–44)
MCH: 28 pg (ref 26–34)
MCHC: 32.5 g/dL (ref 31–37)
MCV: 85.9 fL (ref 80–100)
MPV: 8.6 fL (ref 6.0–12.0)
Monocytes: 6 % (ref 2–11)
Platelets: 321 10*3/uL (ref 140–450)
RBC: 4.87 m/uL (ref 4.0–5.2)
RDW: 14.8 % (ref 12.5–15.4)
Seg Neutrophils: 46 % (ref 36–66)
Segs Absolute: 4.6 10*3/uL (ref 1.8–7.7)
WBC: 9.9 10*3/uL (ref 3.5–11.0)

## 2014-03-06 LAB — LIPID PANEL
Chol/HDL Ratio: 3.4 (ref <5)
Cholesterol: 253 mg/dL — ABNORMAL HIGH (ref <200)
HDL: 75 mg/dL (ref >40)
LDL Cholesterol: 158 mg/dL — ABNORMAL HIGH (ref 0–130)
Triglycerides: 98 mg/dL (ref <150)

## 2014-06-05 DIAGNOSIS — G4733 Obstructive sleep apnea (adult) (pediatric): Secondary | ICD-10-CM | POA: Insufficient documentation

## 2014-06-14 DIAGNOSIS — D6859 Other primary thrombophilia: Secondary | ICD-10-CM | POA: Insufficient documentation

## 2014-06-14 DIAGNOSIS — E559 Vitamin D deficiency, unspecified: Secondary | ICD-10-CM | POA: Insufficient documentation

## 2014-06-27 ENCOUNTER — Encounter

## 2014-09-04 ENCOUNTER — Encounter (HOSPITAL_COMMUNITY): Payer: Self-pay

## 2014-09-04 ENCOUNTER — Other Ambulatory Visit (HOSPITAL_COMMUNITY): Payer: Self-pay | Admitting: *Deleted

## 2014-09-04 ENCOUNTER — Encounter (HOSPITAL_COMMUNITY)
Admission: RE | Admit: 2014-09-04 | Discharge: 2014-09-04 | Disposition: A | Discharge status: Home / Self Care | Payer: No Typology Code available for payment source | Source: Ambulatory Visit | Attending: Orthopedic Surgery | Admitting: Orthopedic Surgery

## 2014-09-04 DIAGNOSIS — Z01812 Encounter for preprocedural laboratory examination: Secondary | ICD-10-CM | POA: Diagnosis present

## 2014-09-04 HISTORY — DX: Vitamin D deficiency, unspecified: E55.9

## 2014-09-04 HISTORY — DX: Anemia, unspecified: D64.9

## 2014-09-04 HISTORY — DX: Unspecified osteoarthritis, unspecified site: M19.90

## 2014-09-04 LAB — CBC
HCT: 44.6 % (ref 36.0–46.0)
Hemoglobin: 14.5 g/dL (ref 12.0–15.0)
MCH: 29.2 pg (ref 26.0–34.0)
MCHC: 32.5 g/dL (ref 30.0–36.0)
MCV: 89.7 fL (ref 78.0–100.0)
Platelets: 278 10*3/uL (ref 150–400)
RBC: 4.97 MIL/uL (ref 3.87–5.11)
RDW: 14.2 % (ref 11.5–15.5)
WBC: 6.1 10*3/uL (ref 4.0–10.5)

## 2014-09-04 LAB — BASIC METABOLIC PANEL
Anion gap: 11 (ref 5–15)
BUN: 13 mg/dL (ref 6–23)
CALCIUM: 9.4 mg/dL (ref 8.4–10.5)
CO2: 27 mmol/L (ref 19–32)
Chloride: 105 mEq/L (ref 96–112)
Creatinine, Ser: 0.81 mg/dL (ref 0.50–1.10)
GFR calc Af Amer: >90 mL/min (ref >90)
GFR calc non Af Amer: 78 mL/min — ABNORMAL LOW (ref >90)
GLUCOSE: 125 mg/dL — AB (ref 70–99)
Potassium: 3.6 mmol/L (ref 3.5–5.1)
Sodium: 143 mmol/L (ref 135–145)

## 2014-09-04 NOTE — Progress Notes (Signed)
No pre-op orders in EPIC. Called Dr. Debroah Loop office to request orders, spoke with Judeen Hammans.

## 2014-09-04 NOTE — Pre-Procedure Instructions (Signed)
Erin Vasquez  09/04/2014   Your procedure is scheduled on:  Tuesday, September 11, 2014 at 1:00 PM.   Report to [facility] Entrance "A" Admitting Office at 11:00 AM.   Call this number if you have problems the morning of surgery: [phone]               Any questions prior to day of surgery, please call [phone] between 8 & 4 PM.   Remember:   Do not eat food or drink liquids after midnight Monday, 09/10/14.   Take these medicines the morning of surgery with A SIP OF WATER: levothyroxine (SYNTHROID, LEVOTHROID)   Do not wear jewelry, make-up or nail polish.  Do not wear lotions, powders, or perfumes. You may wear deodorant.  Do not shave 48 hours prior to surgery.   Do not bring valuables to the hospital.  [facility] is not responsible                  for any belongings or valuables.               Contacts, dentures or bridgework may not be worn into surgery.  Leave suitcase in the car. After surgery it may be brought to your room.  For patients admitted to the hospital, discharge time is determined by your                treatment team.              Special Instructions: [facility] - Preparing for Surgery  Before surgery, you can play an important role.  Because skin is not sterile, your skin needs to be as free of germs as possible.  You can reduce the number of germs on you skin by washing with CHG (chlorahexidine gluconate) soap before surgery.  CHG is an antiseptic cleaner which kills germs and bonds with the skin to continue killing germs even after washing.  Please DO NOT use if you have an allergy to CHG or antibacterial soaps.  If your skin becomes reddened/irritated stop using the CHG and inform your nurse when you arrive at Short Stay.  Do not shave (including legs and underarms) for at least 48 hours prior to the first CHG shower.  You may shave your face.  Please follow these instructions carefully:   1.  Shower with CHG Soap the night before  surgery and the                                morning of Surgery.  2.  If you choose to wash your hair, wash your hair first as usual with your       normal shampoo.  3.  After you shampoo, rinse your hair and body thoroughly to remove the                      Shampoo.  4.  Use CHG as you would any other liquid soap.  You can apply chg directly       to the skin and wash gently with scrungie or a clean washcloth.  5.  Apply the CHG Soap to your body ONLY FROM THE NECK DOWN.        Do not use on open wounds or open sores.  Avoid contact with your eyes, ears, mouth and genitals (private parts).  Wash genitals (private parts) with your normal  soap.  6.  Wash thoroughly, paying special attention to the area where your surgery        will be performed.  7.  Thoroughly rinse your body with warm water from the neck down.  8.  DO NOT shower/wash with your normal soap after using and rinsing off       the CHG Soap.  9.  Pat yourself dry with a clean towel.            10.  Wear clean pajamas.            11.  Place clean sheets on your bed the night of your first shower and do not        sleep with pets.  Day of Surgery  Do not apply any lotions the morning of surgery.  Please wear clean clothes to the hospital.     Please read over the following fact sheets that you were given: Pain Booklet, Coughing and Deep Breathing and Surgical Site Infection Prevention

## 2014-09-04 NOTE — Sleep Study (Signed)
Requested sleep study results from [facility]. Pt is planning to have Bariatric Surgery in the new year. All results are in [facility]. Pt denies chest pain, sob or any cardiac history. Pt's PCP is Dr. Edwin Dada with [facility].

## 2014-09-10 MED ORDER — CEFAZOLIN SODIUM 10 G IJ SOLR
3.0000 g | INTRAMUSCULAR | Status: AC
  Administered 2014-09-11: 3 g via INTRAVENOUS
  Filled 2014-09-10: qty 3000

## 2014-09-11 ENCOUNTER — Encounter (HOSPITAL_COMMUNITY): Payer: Self-pay | Admitting: *Deleted

## 2014-09-11 ENCOUNTER — Ambulatory Visit (HOSPITAL_COMMUNITY)
Admission: RE | Admit: 2014-09-11 | Discharge: 2014-09-11 | Disposition: A | Discharge status: Home / Self Care | Payer: No Typology Code available for payment source | Source: Ambulatory Visit | Attending: Orthopedic Surgery | Admitting: Orthopedic Surgery

## 2014-09-11 ENCOUNTER — Ambulatory Visit (HOSPITAL_COMMUNITY): Payer: No Typology Code available for payment source | Admitting: Anesthesiology

## 2014-09-11 ENCOUNTER — Encounter (HOSPITAL_COMMUNITY)
Admission: RE | Disposition: A | Discharge status: Home / Self Care | Payer: Self-pay | Source: Ambulatory Visit | Attending: Orthopedic Surgery

## 2014-09-11 DIAGNOSIS — K219 Gastro-esophageal reflux disease without esophagitis: Secondary | ICD-10-CM | POA: Insufficient documentation

## 2014-09-11 DIAGNOSIS — J45909 Unspecified asthma, uncomplicated: Secondary | ICD-10-CM | POA: Diagnosis not present

## 2014-09-11 DIAGNOSIS — Z91030 Bee allergy status: Secondary | ICD-10-CM | POA: Diagnosis not present

## 2014-09-11 DIAGNOSIS — M75102 Unspecified rotator cuff tear or rupture of left shoulder, not specified as traumatic: Secondary | ICD-10-CM | POA: Insufficient documentation

## 2014-09-11 DIAGNOSIS — Z91018 Allergy to other foods: Secondary | ICD-10-CM | POA: Diagnosis not present

## 2014-09-11 DIAGNOSIS — Z79899 Other long term (current) drug therapy: Secondary | ICD-10-CM | POA: Diagnosis not present

## 2014-09-11 DIAGNOSIS — E039 Hypothyroidism, unspecified: Secondary | ICD-10-CM | POA: Insufficient documentation

## 2014-09-11 DIAGNOSIS — G4730 Sleep apnea, unspecified: Secondary | ICD-10-CM | POA: Insufficient documentation

## 2014-09-11 DIAGNOSIS — Z880 Allergy status to penicillin: Secondary | ICD-10-CM | POA: Insufficient documentation

## 2014-09-11 DIAGNOSIS — E559 Vitamin D deficiency, unspecified: Secondary | ICD-10-CM | POA: Insufficient documentation

## 2014-09-11 DIAGNOSIS — Z8542 Personal history of malignant neoplasm of other parts of uterus: Secondary | ICD-10-CM | POA: Insufficient documentation

## 2014-09-11 HISTORY — PX: SHOULDER ARTHROSCOPY WITH DEBRIDEMENT AND BICEP TENDON REPAIR: SHX5690

## 2014-09-11 HISTORY — PX: SHOULDER ARTHROSCOPY WITH SUBACROMIAL DECOMPRESSION: SHX5684

## 2014-09-11 HISTORY — PX: SHOULDER ARTHROSCOPY WITH DISTAL CLAVICLE RESECTION: SHX5675

## 2014-09-11 SURGERY — SHOULDER ARTHROSCOPY WITH DEBRIDEMENT AND BICEP TENDON REPAIR
Anesthesia: General | Site: Shoulder | Laterality: Left

## 2014-09-11 MED ORDER — PROMETHAZINE HCL 25 MG/ML IJ SOLN: 6.2500 mg | INTRAMUSCULAR | Status: DC | PRN

## 2014-09-11 MED ORDER — METHYLPREDNISOLONE ACETATE 80 MG/ML IJ SUSP
INTRAMUSCULAR | Status: AC
  Filled 2014-09-11: qty 1

## 2014-09-11 MED ORDER — SODIUM CHLORIDE 0.9 % IV SOLN
10.0000 mg | INTRAVENOUS | Status: DC | PRN
  Administered 2014-09-11: 40 ug/min via INTRAVENOUS

## 2014-09-11 MED ORDER — MIDAZOLAM HCL 5 MG/5ML IJ SOLN
INTRAMUSCULAR | Status: DC | PRN
  Administered 2014-09-11: 1 mg via INTRAVENOUS

## 2014-09-11 MED ORDER — MEPERIDINE HCL 25 MG/ML IJ SOLN: 6.2500 mg | INTRAMUSCULAR | Status: DC | PRN

## 2014-09-11 MED ORDER — FENTANYL CITRATE 0.05 MG/ML IJ SOLN
INTRAMUSCULAR | Status: DC | PRN
  Administered 2014-09-11 (×3): 50 ug via INTRAVENOUS

## 2014-09-11 MED ORDER — PHENYLEPHRINE HCL 10 MG/ML IJ SOLN
INTRAMUSCULAR | Status: AC
  Filled 2014-09-11: qty 1

## 2014-09-11 MED ORDER — SODIUM CHLORIDE 0.9 % IR SOLN
Status: DC | PRN
  Administered 2014-09-11: 6000 mL

## 2014-09-11 MED ORDER — PHENYLEPHRINE HCL 10 MG/ML IJ SOLN
INTRAMUSCULAR | Status: DC | PRN
  Administered 2014-09-11 (×2): 160 ug via INTRAVENOUS

## 2014-09-11 MED ORDER — FENTANYL CITRATE 0.05 MG/ML IJ SOLN
INTRAMUSCULAR | Status: AC
  Administered 2014-09-11: 50 ug via INTRAVENOUS
  Filled 2014-09-11: qty 2

## 2014-09-11 MED ORDER — SUCCINYLCHOLINE CHLORIDE 20 MG/ML IJ SOLN
INTRAMUSCULAR | Status: DC | PRN
  Administered 2014-09-11: 120 mg via INTRAVENOUS

## 2014-09-11 MED ORDER — FENTANYL CITRATE 0.05 MG/ML IJ SOLN
INTRAMUSCULAR | Status: AC
Start: 2014-09-11 — End: 2014-09-11
  Filled 2014-09-11: qty 5

## 2014-09-11 MED ORDER — SUCCINYLCHOLINE CHLORIDE 20 MG/ML IJ SOLN
INTRAMUSCULAR | Status: AC
  Filled 2014-09-11: qty 1

## 2014-09-11 MED ORDER — PROPOFOL 10 MG/ML IV BOLUS
INTRAVENOUS | Status: DC | PRN
  Administered 2014-09-11: 150 mg via INTRAVENOUS

## 2014-09-11 MED ORDER — LIDOCAINE HCL (CARDIAC) 20 MG/ML IV SOLN
INTRAVENOUS | Status: DC | PRN
  Administered 2014-09-11: 50 mg via INTRAVENOUS

## 2014-09-11 MED ORDER — ONDANSETRON HCL 4 MG PO TABS: 4.0000 mg | ORAL_TABLET | Freq: Three times a day (TID) | ORAL | Status: DC | PRN

## 2014-09-11 MED ORDER — MIDAZOLAM HCL 2 MG/2ML IJ SOLN
1.0000 mg | INTRAMUSCULAR | Status: DC | PRN
  Administered 2014-09-11: 2 mg via INTRAVENOUS

## 2014-09-11 MED ORDER — ACETAMINOPHEN 500 MG PO TABS
1000.0000 mg | ORAL_TABLET | Freq: Once | ORAL | Status: AC
  Administered 2014-09-11: 1000 mg via ORAL

## 2014-09-11 MED ORDER — BUPIVACAINE-EPINEPHRINE (PF) 0.5% -1:200000 IJ SOLN
INTRAMUSCULAR | Status: DC | PRN
  Administered 2014-09-11: 25 mL via PERINEURAL

## 2014-09-11 MED ORDER — FENTANYL CITRATE 0.05 MG/ML IJ SOLN
INTRAMUSCULAR | Status: AC
  Administered 2014-09-11: 100 ug via INTRAVENOUS
  Filled 2014-09-11: qty 2

## 2014-09-11 MED ORDER — DOCUSATE SODIUM 100 MG PO CAPS: 100.0000 mg | ORAL_CAPSULE | Freq: Two times a day (BID) | ORAL | Status: DC

## 2014-09-11 MED ORDER — ARTIFICIAL TEARS OP OINT
TOPICAL_OINTMENT | OPHTHALMIC | Status: DC | PRN
  Administered 2014-09-11: 1 via OPHTHALMIC

## 2014-09-11 MED ORDER — PHENYLEPHRINE 40 MCG/ML (10ML) SYRINGE FOR IV PUSH (FOR BLOOD PRESSURE SUPPORT)
PREFILLED_SYRINGE | INTRAVENOUS | Status: AC
  Filled 2014-09-11: qty 20

## 2014-09-11 MED ORDER — OXYCODONE HCL 5 MG PO TABS
ORAL_TABLET | ORAL | Status: AC
  Filled 2014-09-11: qty 1

## 2014-09-11 MED ORDER — METHYLPREDNISOLONE ACETATE 80 MG/ML IJ SUSP
INTRAMUSCULAR | Status: DC | PRN
  Administered 2014-09-11: 80 mg

## 2014-09-11 MED ORDER — OXYCODONE-ACETAMINOPHEN 5-325 MG PO TABS: 2.0000 | ORAL_TABLET | ORAL | Status: DC | PRN

## 2014-09-11 MED ORDER — FENTANYL CITRATE 0.05 MG/ML IJ SOLN: 25.0000 ug | INTRAMUSCULAR | Status: DC | PRN

## 2014-09-11 MED ORDER — MIDAZOLAM HCL 2 MG/2ML IJ SOLN
INTRAMUSCULAR | Status: AC
  Administered 2014-09-11: 2 mg via INTRAVENOUS
  Filled 2014-09-11: qty 2

## 2014-09-11 MED ORDER — ONDANSETRON HCL 4 MG/2ML IJ SOLN
INTRAMUSCULAR | Status: DC | PRN
  Administered 2014-09-11: 4 mg via INTRAVENOUS

## 2014-09-11 MED ORDER — NEOSTIGMINE METHYLSULFATE 10 MG/10ML IV SOLN
INTRAVENOUS | Status: DC | PRN
  Administered 2014-09-11: 3 mg via INTRAVENOUS

## 2014-09-11 MED ORDER — LACTATED RINGERS IV SOLN
INTRAVENOUS | Status: DC | PRN
  Administered 2014-09-11 (×2): via INTRAVENOUS

## 2014-09-11 MED ORDER — GLYCOPYRROLATE 0.2 MG/ML IJ SOLN
INTRAMUSCULAR | Status: DC | PRN
  Administered 2014-09-11: 0.4 mg via INTRAVENOUS

## 2014-09-11 MED ORDER — DEXTROSE-NACL 5-0.45 % IV SOLN: 100.0000 mL/h | INTRAVENOUS | Status: DC

## 2014-09-11 MED ORDER — LACTATED RINGERS IV SOLN
500.0000 mL | INTRAVENOUS | Status: DC
  Administered 2014-09-11: 1000 mL via INTRAVENOUS

## 2014-09-11 MED ORDER — ACETAMINOPHEN 500 MG PO TABS
ORAL_TABLET | ORAL | Status: AC
  Administered 2014-09-11: 1000 mg via ORAL
  Filled 2014-09-11: qty 2

## 2014-09-11 MED ORDER — DEXTROSE 5 % IV SOLN
3.0000 g | INTRAVENOUS | Status: DC | PRN
  Administered 2014-09-11: 3 g via INTRAVENOUS

## 2014-09-11 MED ORDER — FENTANYL CITRATE 0.05 MG/ML IJ SOLN
50.0000 ug | INTRAMUSCULAR | Status: DC | PRN
  Administered 2014-09-11: 50 ug via INTRAVENOUS
  Administered 2014-09-11: 100 ug via INTRAVENOUS

## 2014-09-11 MED ORDER — MIDAZOLAM HCL 2 MG/2ML IJ SOLN
INTRAMUSCULAR | Status: AC
  Filled 2014-09-11: qty 2

## 2014-09-11 MED ORDER — OXYCODONE HCL 5 MG PO TABS
5.0000 mg | ORAL_TABLET | Freq: Once | ORAL | Status: AC
  Administered 2014-09-11: 5 mg via ORAL

## 2014-09-11 MED ORDER — ROCURONIUM BROMIDE 100 MG/10ML IV SOLN
INTRAVENOUS | Status: DC | PRN
  Administered 2014-09-11 (×2): 10 mg via INTRAVENOUS

## 2014-09-11 SURGICAL SUPPLY — 49 items
BENZOIN TINCTURE PRP APPL 2/3 (GAUZE/BANDAGES/DRESSINGS) IMPLANT
BLADE CUTTER GATOR 3.5 (BLADE) ×3 IMPLANT
BLADE SURG 11 STRL SS (BLADE) ×3 IMPLANT
BUR OVAL 4.0 (BURR) IMPLANT
CANNULA 5.75X71 LONG (CANNULA) ×3 IMPLANT
CANNULA TWIST IN 8.25X7CM (CANNULA) ×3 IMPLANT
CHLORAPREP W/TINT 26ML (MISCELLANEOUS) ×3 IMPLANT
DRAPE STERI 35X30 U-POUCH (DRAPES) ×3 IMPLANT
DRAPE U-SHAPE 47X51 STRL (DRAPES) ×3 IMPLANT
DRSG EMULSION OIL 3X3 NADH (GAUZE/BANDAGES/DRESSINGS) ×3 IMPLANT
ELECT REM PT RETURN 9FT ADLT (ELECTROSURGICAL) ×3
ELECTRODE REM PT RTRN 9FT ADLT (ELECTROSURGICAL) ×2 IMPLANT
GAUZE SPONGE 4X4 12PLY STRL (GAUZE/BANDAGES/DRESSINGS) ×6 IMPLANT
GAUZE XEROFORM 5X9 LF (GAUZE/BANDAGES/DRESSINGS) ×3 IMPLANT
GLOVE BIO SURGEON STRL SZ7.5 (GLOVE) ×3 IMPLANT
GLOVE BIOGEL PI IND STRL 8 (GLOVE) ×4 IMPLANT
GLOVE BIOGEL PI INDICATOR 8 (GLOVE) ×2
GLOVE BIOGEL PI ORTHO PRO SZ7 (GLOVE) ×1
GLOVE BIOGEL PI ORTHO PRO SZ8 (GLOVE) ×1
GLOVE PI ORTHO PRO STRL SZ7 (GLOVE) ×2 IMPLANT
GLOVE PI ORTHO PRO STRL SZ8 (GLOVE) ×2 IMPLANT
GOWN STRL REUS W/ TWL LRG LVL3 (GOWN DISPOSABLE) ×4 IMPLANT
GOWN STRL REUS W/ TWL XL LVL3 (GOWN DISPOSABLE) ×2 IMPLANT
GOWN STRL REUS W/TWL LRG LVL3 (GOWN DISPOSABLE) ×2
GOWN STRL REUS W/TWL XL LVL3 (GOWN DISPOSABLE) ×1
KIT BASIN OR (CUSTOM PROCEDURE TRAY) ×3 IMPLANT
MANIFOLD NEPTUNE II (INSTRUMENTS) ×3 IMPLANT
NEEDLE SCORPION MULTI FIRE (NEEDLE) IMPLANT
NS IRRIG 1000ML POUR BTL (IV SOLUTION) IMPLANT
PACK ARTHROSCOPY DSU (CUSTOM PROCEDURE TRAY) ×3 IMPLANT
PACK SHOULDER (CUSTOM PROCEDURE TRAY) ×6 IMPLANT
PAD ABD 8X10 STRL (GAUZE/BANDAGES/DRESSINGS) ×3 IMPLANT
SET ARTHROSCOPY TUBING (MISCELLANEOUS) ×1
SET ARTHROSCOPY TUBING LN (MISCELLANEOUS) ×2 IMPLANT
SLING ARM IMMOBILIZER LRG (SOFTGOODS) IMPLANT
SLING ARM IMMOBILIZER MED (SOFTGOODS) IMPLANT
SLING ARM LRG ADULT FOAM STRAP (SOFTGOODS) IMPLANT
SLING ARM MED ADULT FOAM STRAP (SOFTGOODS) IMPLANT
SLING ARM XL FOAM STRAP (SOFTGOODS) IMPLANT
SPONGE GAUZE 4X4 12PLY STER LF (GAUZE/BANDAGES/DRESSINGS) ×3 IMPLANT
SPONGE LAP 4X18 X RAY DECT (DISPOSABLE) IMPLANT
STRIP CLOSURE SKIN 1/2X4 (GAUZE/BANDAGES/DRESSINGS) IMPLANT
SUT ETHILON 3 0 PS 1 (SUTURE) ×3 IMPLANT
SUT TIGER TAPE 7 IN WHITE (SUTURE) IMPLANT
TAPE CLOTH SURG 4X10 WHT LF (GAUZE/BANDAGES/DRESSINGS) ×3 IMPLANT
TAPE FIBER 2MM 7IN #2 BLUE (SUTURE) IMPLANT
TOWEL OR 17X24 6PK STRL BLUE (TOWEL DISPOSABLE) ×3 IMPLANT
WAND HAND CNTRL MULTIVAC 90 (MISCELLANEOUS) ×3 IMPLANT
WATER STERILE IRR 1000ML POUR (IV SOLUTION) ×3 IMPLANT

## 2014-09-11 NOTE — Anesthesia Postprocedure Evaluation (Signed)
  Anesthesia Post-op Note  Patient: Erin Vasquez  Procedure(s) Performed: Procedure(s): SHOULDER ARTHROSCOPY WITH DEBRIDEMENT AND BICEP TENDON REPAIR (Left) SHOULDER ARTHROSCOPY WITH DISTAL CLAVICLE RESECTION (Left) SHOULDER ARTHROSCOPY WITH SUBACROMIAL DECOMPRESSION (Left)  Patient Location: PACU  Anesthesia Type:General and GA combined with regional for post-op pain  Level of Consciousness: awake, alert  and oriented  Airway and Oxygen Therapy: Patient Spontanous Breathing and Patient connected to nasal cannula oxygen  Post-op Pain: none  Post-op Assessment: Post-op Vital signs reviewed, Patient's Cardiovascular Status Stable, Respiratory Function Stable, Patent Airway, No signs of Nausea or vomiting and Pain level controlled  Post-op Vital Signs: stable  Last Vitals:  Filed Vitals:   09/11/14 1700  BP: 131/82  Pulse: 77  Temp:   Resp: 15    Complications: No apparent anesthesia complications

## 2014-09-11 NOTE — Op Note (Signed)
09/11/2014  2:35 PM  PATIENT:  Erin Vasquez    PRE-OPERATIVE DIAGNOSIS:  LEFT SHOULDER ROTATOR CUFF TEAR  POST-OPERATIVE DIAGNOSIS:  Same  PROCEDURE:  SHOULDER ARTHROSCOPY WITH DEBRIDEMENT AND BICEP TENDON REPAIR, SHOULDER ARTHROSCOPY WITH DISTAL CLAVICLE RESECTION, SHOULDER ARTHROSCOPY WITH SUBACROMIAL DECOMPRESSION  SURGEON:  Edmonia Lynch, D, MD  ASSISTANT: Joya Gaskins, OPA. He was vital and necessary throughout the case.  ANESTHESIA:   General  PREOPERATIVE INDICATIONS:  Erin Vasquez is a  58 y.o. female with a diagnosis of Erin Vasquez who failed conservative measures and elected for surgical management.    The risks benefits and alternatives were discussed with the patient preoperatively including but not limited to the risks of infection, bleeding, nerve injury, cardiopulmonary complications, the need for revision surgery, among others, and the patient was willing to proceed.  OPERATIVE IMPLANTS: none  OPERATIVE FINDINGS: irreparable cuff  BLOOD LOSS: minimal  COMPLICATIONS: none  OPERATIVE PROCEDURE:  Patient was identified in the preoperative holding area and site was marked by me He was transported to the operating theater and placed on the table in beach chair position taking care to pad all bony prominences. After a preincinduction time out anesthesia was induced. The left upper extremity was prepped and draped in normal sterile fashion and a pre-incision timeout was performed. Erin Vasquez received ancef for preoperative antibiotics.   Initially made a posterior arthroscopic portal and inserted the arthroscope into the glenohumeral joint. tour of the joint demonstrated the above operative findings  I created an anterior portal just lateral to the coracoid under direct visualization using a spinal needle.  I performed an extensive debridement of the scarred synovial tissue and remaining structures  At this point I examine the rotator  cuff and it was retracted past the superior labrum with very little mobility. The biceps tendon was significantly frayed with synovial inflammation around the sling.   I used a combination of biter and shaver to release the biceps tendon from the superior labrum and then used the shaver to debride the superior labrum to a smooth rim.  I then introduced the arthroscope into the subacromial space and brought the shaver into the anterior portal. I debrided the bursa for appropriate visualization.  I then performed a subacromial decompression using combination of the shaver ArthroCare and burr using a cutting block technique. As happy with the final elevation of the subacromial space on multiple portal views.  Next I turned my attention to the distal clavicle and through the anterior portal using the bur and shaver I was able to perform a distal clavicle excision. I then switched portals and inserted the arthroscope into the anterior portal and was happy with an appropriate resection of the distal clavicle.   I debrided the rotator cuff tear and again examined its mobility. Again it was very poor tissue with little to no mobility.  Next I removed all arthroscopic equipment expressed all fluid and closed the portals with a nylon stitch. A sterile dressing was applied the patient was taken the PACU in stable condition.  POST OPERATIVE PLAN: The patient will be in a sling full-time and keep the dressings clean dry and intact. DVT prophylaxis will consist of early ambulation  This note was generated using a template and dragon dictation system. In light of that, I have reviewed the note and all aspects of it are applicable to this case. Any dictation errors are due to the computerized dictation system.

## 2014-09-11 NOTE — Interval H&P Note (Signed)
History and Physical Interval Note:  09/11/2014 8:06 AM  Erin Vasquez  has presented today for surgery, with the diagnosis of LEFT SHOULDER ROTATOR CUFF TEAR  The various methods of treatment have been discussed with the patient and family. After consideration of risks, benefits and other options for treatment, the patient has consented to  Procedure(s): SHOULDER ARTHROSCOPY WITH ROTATOR CUFF REPAIR AND SUBACROMIAL DECOMPRESSION (Left) SHOULDER ARTHROSCOPY WITH DEBRIDEMENT AND BICEP TENDON REPAIR (Left) SHOULDER ARTHROSCOPY WITH DISTAL CLAVICLE RESECTION (Left) as a surgical intervention .  The patient's history has been reviewed, patient examined, no change in status, stable for surgery.  I have reviewed the patient's chart and labs.  Questions were answered to the patient's satisfaction.     Shaneka Efaw, D

## 2014-09-11 NOTE — Anesthesia Preprocedure Evaluation (Addendum)
Anesthesia Evaluation  Patient identified by MRN, date of birth, ID band Patient awake    Reviewed: Allergy & Precautions, H&P , NPO status , Patient's Chart, lab work & pertinent test results, reviewed documented beta blocker date and time   Airway Mallampati: II  TM Distance: >3 FB Neck ROM: Full    Dental  (+) Teeth Intact, Dental Advisory Given, Caps,    Pulmonary sleep apnea ,  breath sounds clear to auscultation        Cardiovascular negative cardio ROS  Rhythm:Regular     Neuro/Psych    GI/Hepatic Neg liver ROS, GERD-  Medicated,  Endo/Other  Hypothyroidism (replacement)   Renal/GU negative Renal ROS     Musculoskeletal   Abdominal   Peds  Hematology H/H 14/44   Anesthesia Other Findings   Reproductive/Obstetrics                           Anesthesia Physical Anesthesia Plan  ASA: III  Anesthesia Plan: General   Post-op Pain Management: MAC Combined w/ Regional for Post-op pain   Induction: Intravenous  Airway Management Planned: Oral ETT  Additional Equipment:   Intra-op Plan:   Post-operative Plan: Extubation in OR  Informed Consent: I have reviewed the patients History and Physical, chart, labs and discussed the procedure including the risks, benefits and alternatives for the proposed anesthesia with the patient or authorized representative who has indicated his/her understanding and acceptance.   Dental advisory given  Plan Discussed with:   Anesthesia Plan Comments:        Anesthesia Quick Evaluation

## 2014-09-11 NOTE — Transfer of Care (Signed)
Immediate Anesthesia Transfer of Care Note  Patient: Erin Vasquez  Procedure(s) Performed: Procedure(s): SHOULDER ARTHROSCOPY WITH DEBRIDEMENT AND BICEP TENDON REPAIR (Left) SHOULDER ARTHROSCOPY WITH DISTAL CLAVICLE RESECTION (Left) SHOULDER ARTHROSCOPY WITH SUBACROMIAL DECOMPRESSION (Left)  Patient Location: PACU  Anesthesia Type:GA combined with regional for post-op pain  Level of Consciousness: awake  Airway & Oxygen Therapy: Patient Spontanous Breathing and Patient connected to nasal cannula oxygen  Post-op Assessment: Report given to PACU RN and Post -op Vital signs reviewed and stable  Post vital signs: Reviewed and stable  Complications: No apparent anesthesia complications

## 2014-09-11 NOTE — Discharge Instructions (Signed)
Use your sling for comfort but it is ok to be out of the sling if not hurting  You may remove your dressing and shower on Saturday then cover your incisions with bandaids.   What to eat:  For your first meals, you should eat lightly; only small meals initially.  If you do not have nausea, you may eat larger meals.  Avoid spicy, greasy and heavy food.    General Anesthesia, Adult, Care After  Refer to this sheet in the next few weeks. These instructions provide you with information on caring for yourself after your procedure. Your health care provider may also give you more specific instructions. Your treatment has been planned according to current medical practices, but problems sometimes occur. Call your health care provider if you have any problems or questions after your procedure.  WHAT TO EXPECT AFTER THE PROCEDURE  After the procedure, it is typical to experience:  Sleepiness.  Nausea and vomiting. HOME CARE INSTRUCTIONS  For the first 24 hours after general anesthesia:  Have a responsible person with you.  Do not drive a car. If you are alone, do not take public transportation.  Do not drink alcohol.  Do not take medicine that has not been prescribed by your health care provider.  Do not sign important papers or make important decisions.  You may resume a normal diet and activities as directed by your health care provider.  Change bandages (dressings) as directed.  If you have questions or problems that seem related to general anesthesia, call the hospital and ask for the anesthetist or anesthesiologist on call. SEEK MEDICAL CARE IF:  You have nausea and vomiting that continue the day after anesthesia.  You develop a rash. SEEK IMMEDIATE MEDICAL CARE IF:  You have difficulty breathing.  You have chest pain.  You have any allergic problems. Document Released: 12/07/2000 Document Revised: 05/03/2013 Document Reviewed: 03/16/2013  [facility] Patient Information 2014 [place], [place].

## 2014-09-11 NOTE — H&P (Signed)
ORTHOPAEDIC CONSULTATION  REQUESTING PHYSICIAN: Renette Butters, MD  Chief Complaint: left shoulder pain   HPI: Erin Vasquez is a 58 y.o. female who complains of  Chronic left shoulder pain  Past Medical History  Diagnosis Date  . Thyroid disease   . Hypothyroidism   . Asthma     as child- none recently  . GERD (gastroesophageal reflux disease)     tx. Tums  . H/O hiatal hernia     hx. esophageal dilation x1  . Sleep apnea     no cpap- unable to tolerate mask  . Cancer     uterine cancer  . Arthritis     shoulders, hands, hip  . Vitamin D deficiency   . Anemia     during pregnancy   Past Surgical History  Procedure Laterality Date  . Bunionectomy Left   . Tonsillectomy    . Esophagogastroduodenoscopy (egd) with propofol N/A 08/02/2013    Procedure: ESOPHAGOGASTRODUODENOSCOPY (EGD) WITH PROPOFOL;  Surgeon: Arta Silence, MD;  Location: [facility] ENDOSCOPY;  Service: Endoscopy;  Laterality: N/A;  . Balloon dilation N/A 08/02/2013    Procedure: BALLOON DILATION;  Surgeon: Arta Silence, MD;  Location: [facility] ENDOSCOPY;  Service: Endoscopy;  Laterality: N/A;  . Colonoscopy with propofol N/A 08/02/2013    Procedure: COLONOSCOPY WITH PROPOFOL;  Surgeon: Arta Silence, MD;  Location: [facility] ENDOSCOPY;  Service: Endoscopy;  Laterality: N/A;  . Abdominal hysterectomy      robotic surgery for uterine cancer  . Dilation and curettage of uterus     History   Social History  . Marital Status: Divorced    Spouse Name: N/A    Number of Children: N/A  . Years of Education: N/A   Social History Main Topics  . Smoking status: Never Smoker   . Smokeless tobacco: Former Systems developer    Types: Chew  . Alcohol Use: No  . Drug Use: No  . Sexual Activity: Yes    Birth Control/ Protection: None   Other Topics Concern  . Not on file   Social History Narrative   Family History  Problem Relation Age of Onset  . Transient ischemic attack Mother   . Heart attack Father   . Diabetes  type II Other   . Lung cancer Maternal Grandmother    Allergies  Allergen Reactions  . Food Anaphylaxis    Tangerines  . Other Anaphylaxis    [place] smoke  . Bee Venom Swelling  . Penicillins     UNKNOWN: CHILDHOOD ALLERGY    Prior to Admission medications   Medication Sig Start Date End Date Taking? Authorizing Provider  calcium carbonate (TUMS - DOSED IN MG ELEMENTAL CALCIUM) 500 MG chewable tablet Chew 3 tablets by mouth daily as needed for indigestion or heartburn.    Yes Historical Provider, MD  levothyroxine (SYNTHROID, LEVOTHROID) 50 MCG tablet Take 50 mcg by mouth daily before breakfast.    Yes Historical Provider, MD  Cholecalciferol (VITAMIN D3) 2000 UNITS TABS Take 1 capsule by mouth daily.    Historical Provider, MD   No results found.  Positive ROS: All other systems have been reviewed and were otherwise negative with the exception of those mentioned in the HPI and as above.  Labs cbc No results for input(s): WBC, HGB, HCT, PLT in the last 72 hours.  Labs inflam No results for input(s): CRP in the last 72 hours.  Invalid input(s): ESR  Labs coag No results for input(s): INR, PTT in  the last 72 hours.  Invalid input(s): PT  No results for input(s): NA, K, CL, CO2, GLUCOSE, BUN, CREATININE, CALCIUM in the last 72 hours.  Physical Exam: There were no vitals filed for this visit. General: Alert, no acute distress Cardiovascular: No pedal edema Respiratory: No cyanosis, no use of accessory musculature GI: No organomegaly, abdomen is soft and non-tender Skin: No lesions in the area of chief complaint other than those listed below in MSK exam.  Neurologic: Sensation intact distally Psychiatric: Patient is competent for consent with normal mood and affect Lymphatic: No axillary or cervical lymphadenopathy  MUSCULOSKELETAL:  LUE: pain and weekness Other extremities are atraumatic with painless ROM and NVI.  Assessment: Left shoulder pathology  Plan: Left  shoulder scope and possible cuff repair with DCE, SAD, Bi tenotomy, debridement   Edmonia Lynch, D, MD Cell [phone]   09/11/2014 8:04 AM

## 2014-09-11 NOTE — Anesthesia Procedure Notes (Addendum)
Anesthesia Regional Block:  Interscalene brachial plexus block  Pre-Anesthetic Checklist: ,, timeout performed, Correct Patient, Correct Site, Correct Laterality, Correct Procedure, Correct Position, site marked, Risks and benefits discussed,  Surgical consent,  Pre-op evaluation,  At surgeon's request and post-op pain management  Laterality: Left  Prep: Maximum Sterile Barrier Precautions used and chloraprep       Needles:  Injection technique: Single-shot  Needle Type: Echogenic Stimulator Needle     Needle Length: 10cm 10 cm Needle Gauge: 21 and 21 G    Additional Needles:  Procedures: ultrasound guided (picture in chart) and nerve stimulator Interscalene brachial plexus block  Nerve Stimulator or Paresthesia:  Response: 0.5 mA,   Additional Responses:   Narrative:  Anesthesiologist: MANNY, THEODORE  Additional Notes: L IS  Block, 16ml .5% marcaine with epi, multiple asp, talked to patient throughout, no complications, NS down to .16ma   Procedure Name: Intubation Date/Time: 09/11/2014 1:17 PM Performed by: Maude Leriche D Pre-anesthesia Checklist: Patient identified, Emergency Drugs available, Suction available, Patient being monitored and Timeout performed Patient Re-evaluated:Patient Re-evaluated prior to inductionOxygen Delivery Method: Circle system utilized Preoxygenation: Pre-oxygenation with 100% oxygen Intubation Type: IV induction Ventilation: Mask ventilation without difficulty Laryngoscope Size: Mac and 3 Grade View: Grade I Tube type: Oral Tube size: 7.5 mm Number of attempts: 1 Airway Equipment and Method: Stylet Placement Confirmation: ETT inserted through vocal cords under direct vision,  positive ETCO2 and breath sounds checked- equal and bilateral Secured at: 21 cm Tube secured with: Tape Dental Injury: Teeth and Oropharynx as per pre-operative assessment

## 2014-09-12 ENCOUNTER — Encounter (HOSPITAL_COMMUNITY): Payer: Self-pay | Admitting: Orthopedic Surgery

## 2014-10-23 LAB — COMPREHENSIVE METABOLIC PANEL
ALT: 11 U/L (ref 5–33)
AST: 16 U/L (ref <32)
Albumin/Globulin Ratio: 1.6 (ref 1.0–2.5)
Albumin: 4.7 g/dL (ref 3.5–5.2)
Alkaline Phosphatase: 76 U/L (ref 35–104)
Anion Gap: 14 mmol/L (ref 9–17)
BUN: 5 mg/dL — ABNORMAL LOW (ref 6–20)
CO2: 23 mmol/L (ref 20–31)
Calcium: 9.8 mg/dL (ref 8.6–10.4)
Chloride: 103 mmol/L (ref 98–107)
Creatinine: 0.63 mg/dL (ref 0.50–0.90)
GFR African American: >60 mL/min (ref >60)
GFR Non-African American: >60 mL/min (ref >60)
Glucose: 93 mg/dL (ref 70–99)
Potassium: 4.3 mmol/L (ref 3.7–5.3)
Sodium: 140 mmol/L (ref 135–144)
Total Bilirubin: 0.34 mg/dL (ref 0.3–1.2)
Total Protein: 7.7 g/dL (ref 6.4–8.3)

## 2014-10-23 LAB — CBC WITH AUTO DIFFERENTIAL
Absolute Eos #: 0.3 10*3/uL (ref 0.0–0.4)
Absolute Lymph #: 4.4 10*3/uL (ref 1.0–4.8)
Absolute Mono #: 0.5 10*3/uL (ref 0.1–1.2)
Basophils Absolute: 0.1 10*3/uL (ref 0.0–0.2)
Basophils: 1 % (ref 0–2)
Eosinophils %: 2 % (ref 1–4)
Hematocrit: 42.4 % (ref 36–46)
Hemoglobin: 14.1 g/dL (ref 12.0–16.0)
Lymphocytes: 40 % (ref 24–44)
MCH: 28.5 pg (ref 26–34)
MCHC: 33.3 g/dL (ref 31–37)
MCV: 85.7 fL (ref 80–100)
MPV: 9.5 fL (ref 6.0–12.0)
Monocytes: 5 % (ref 2–11)
Platelets: 304 10*3/uL (ref 140–450)
RBC: 4.96 m/uL (ref 4.0–5.2)
RDW: 14.1 % (ref 12.5–15.4)
Seg Neutrophils: 52 % (ref 36–66)
Segs Absolute: 5.6 10*3/uL (ref 1.8–7.7)
WBC: 10.8 10*3/uL (ref 3.5–11.0)

## 2014-10-23 LAB — LIPID PANEL
Chol/HDL Ratio: 3.3 (ref <5)
Cholesterol: 249 mg/dL — ABNORMAL HIGH (ref <200)
HDL: 76 mg/dL (ref >40)
LDL Cholesterol: 156 mg/dL — ABNORMAL HIGH (ref 0–130)
Triglycerides: 84 mg/dL (ref <150)

## 2014-10-25 LAB — DRUGS OF ABUSE, URINE
Alcohol, Urine: NEGATIVE mg/dL
Amphetamine Screen, Ur: NEGATIVE ng/mL
Barbiturate Screen, Ur: NEGATIVE ng/mL
Benzodiazepine Screen, Urine: NEGATIVE ng/mL
Cannabinoid Scrn, Ur: NEGATIVE ng/mL
Cocaine(Metab.)Screen, Urine: NEGATIVE ng/mL
Creatinine, Ur: 37.3 mg/dL (ref 20.0–400.0)
Methadone Screen, Urine: NEGATIVE ng/mL
Opiates, Urine: NEGATIVE ng/mL
Phencyclidine, Urine: NEGATIVE ng/mL
Propoxyphene, Urine: NEGATIVE ng/mL

## 2014-11-22 LAB — URINE DRUG SCREEN
Amphetamine Screen, Ur: NEGATIVE
Barbiturate Screen, Ur: NEGATIVE
Benzodiazepine Screen, Urine: NEGATIVE
Cannabinoid Scrn, Ur: NEGATIVE
Cocaine Metabolite, Urine: NEGATIVE
Methadone Screen, Urine: NEGATIVE
Opiates, Urine: NEGATIVE
Oxycodone Screen, Ur: NEGATIVE
Phencyclidine, Urine: NEGATIVE

## 2014-12-07 LAB — URINE DRUG SCREEN
Amphetamine Screen, Ur: NEGATIVE
Barbiturate Screen, Ur: NEGATIVE
Benzodiazepine Screen, Urine: NEGATIVE
Cannabinoid Scrn, Ur: NEGATIVE
Cocaine Metabolite, Urine: NEGATIVE
Methadone Screen, Urine: NEGATIVE
Opiates, Urine: NEGATIVE
Oxycodone Screen, Ur: NEGATIVE
Phencyclidine, Urine: NEGATIVE

## 2015-10-28 ENCOUNTER — Other Ambulatory Visit (HOSPITAL_COMMUNITY): Payer: Self-pay | Admitting: Orthopedic Surgery

## 2015-10-28 DIAGNOSIS — M7989 Other specified soft tissue disorders: Principal | ICD-10-CM

## 2015-10-28 DIAGNOSIS — M79662 Pain in left lower leg: Secondary | ICD-10-CM

## 2015-10-29 ENCOUNTER — Encounter (HOSPITAL_COMMUNITY): Payer: Self-pay | Admitting: Emergency Medicine

## 2015-10-29 ENCOUNTER — Inpatient Hospital Stay (HOSPITAL_COMMUNITY)
Admission: EM | Admit: 2015-10-29 | Discharge: 2015-10-30 | DRG: 299 | Disposition: A | Discharge status: Home / Self Care | Payer: Medicaid Other | Attending: Family Medicine | Admitting: Family Medicine

## 2015-10-29 ENCOUNTER — Ambulatory Visit (HOSPITAL_BASED_OUTPATIENT_CLINIC_OR_DEPARTMENT_OTHER)
Admission: RE | Admit: 2015-10-29 | Discharge: 2015-10-29 | Disposition: A | Discharge status: Home / Self Care | Payer: Medicaid Other | Source: Ambulatory Visit | Attending: Nurse Practitioner | Admitting: Nurse Practitioner

## 2015-10-29 ENCOUNTER — Emergency Department (HOSPITAL_COMMUNITY): Payer: Medicaid Other

## 2015-10-29 DIAGNOSIS — E559 Vitamin D deficiency, unspecified: Secondary | ICD-10-CM | POA: Diagnosis present

## 2015-10-29 DIAGNOSIS — I2699 Other pulmonary embolism without acute cor pulmonale: Secondary | ICD-10-CM

## 2015-10-29 DIAGNOSIS — Z880 Allergy status to penicillin: Secondary | ICD-10-CM

## 2015-10-29 DIAGNOSIS — I82402 Acute embolism and thrombosis of unspecified deep veins of left lower extremity: Secondary | ICD-10-CM

## 2015-10-29 DIAGNOSIS — I82442 Acute embolism and thrombosis of left tibial vein: Secondary | ICD-10-CM | POA: Diagnosis present

## 2015-10-29 DIAGNOSIS — Z8542 Personal history of malignant neoplasm of other parts of uterus: Secondary | ICD-10-CM | POA: Diagnosis not present

## 2015-10-29 DIAGNOSIS — M19012 Primary osteoarthritis, left shoulder: Secondary | ICD-10-CM | POA: Diagnosis present

## 2015-10-29 DIAGNOSIS — Z90710 Acquired absence of both cervix and uterus: Secondary | ICD-10-CM | POA: Diagnosis not present

## 2015-10-29 DIAGNOSIS — M1990 Unspecified osteoarthritis, unspecified site: Secondary | ICD-10-CM

## 2015-10-29 DIAGNOSIS — M7989 Other specified soft tissue disorders: Secondary | ICD-10-CM | POA: Insufficient documentation

## 2015-10-29 DIAGNOSIS — E669 Obesity, unspecified: Secondary | ICD-10-CM | POA: Diagnosis not present

## 2015-10-29 DIAGNOSIS — Z6839 Body mass index (BMI) 39.0-39.9, adult: Secondary | ICD-10-CM

## 2015-10-29 DIAGNOSIS — M79605 Pain in left leg: Secondary | ICD-10-CM | POA: Diagnosis not present

## 2015-10-29 DIAGNOSIS — G4730 Sleep apnea, unspecified: Secondary | ICD-10-CM | POA: Diagnosis present

## 2015-10-29 DIAGNOSIS — I82409 Acute embolism and thrombosis of unspecified deep veins of unspecified lower extremity: Secondary | ICD-10-CM

## 2015-10-29 DIAGNOSIS — M79662 Pain in left lower leg: Secondary | ICD-10-CM

## 2015-10-29 DIAGNOSIS — E039 Hypothyroidism, unspecified: Secondary | ICD-10-CM

## 2015-10-29 DIAGNOSIS — M25512 Pain in left shoulder: Secondary | ICD-10-CM

## 2015-10-29 DIAGNOSIS — K219 Gastro-esophageal reflux disease without esophagitis: Secondary | ICD-10-CM | POA: Diagnosis present

## 2015-10-29 HISTORY — DX: Unspecified asthma, uncomplicated: J45.909

## 2015-10-29 HISTORY — DX: Other chronic pain: G89.29

## 2015-10-29 HISTORY — DX: Malignant neoplasm of uterus, part unspecified: C55

## 2015-10-29 HISTORY — DX: Acute embolism and thrombosis of unspecified deep veins of unspecified lower extremity: I82.409

## 2015-10-29 HISTORY — DX: Other pulmonary embolism without acute cor pulmonale: I26.99

## 2015-10-29 HISTORY — DX: Other specified postprocedural states: Z98.890

## 2015-10-29 HISTORY — DX: Dorsalgia, unspecified: M54.9

## 2015-10-29 LAB — COMPREHENSIVE METABOLIC PANEL
ALT: 15 U/L (ref 14–54)
AST: 18 U/L (ref 15–41)
Albumin: 3.8 g/dL (ref 3.5–5.0)
Alkaline Phosphatase: 63 U/L (ref 38–126)
Anion gap: 13 (ref 5–15)
BUN: 14 mg/dL (ref 6–20)
CALCIUM: 9.2 mg/dL (ref 8.9–10.3)
CHLORIDE: 107 mmol/L (ref 101–111)
CO2: 20 mmol/L — ABNORMAL LOW (ref 22–32)
CREATININE: 0.83 mg/dL (ref 0.44–1.00)
GFR calc non Af Amer: >60 mL/min (ref >60)
Glucose, Bld: 86 mg/dL (ref 65–99)
Potassium: 4.1 mmol/L (ref 3.5–5.1)
Sodium: 140 mmol/L (ref 135–145)
Total Bilirubin: 0.6 mg/dL (ref 0.3–1.2)
Total Protein: 7.2 g/dL (ref 6.5–8.1)

## 2015-10-29 LAB — CBC WITH DIFFERENTIAL/PLATELET
BASOS ABS: 0 10*3/uL (ref 0.0–0.1)
Basophils Relative: 1 %
EOS PCT: 6 %
Eosinophils Absolute: 0.4 10*3/uL (ref 0.0–0.7)
HCT: 42.1 % (ref 36.0–46.0)
Hemoglobin: 13.5 g/dL (ref 12.0–15.0)
Lymphocytes Relative: 15 %
Lymphs Abs: 0.9 10*3/uL (ref 0.7–4.0)
MCH: 28.5 pg (ref 26.0–34.0)
MCHC: 32.1 g/dL (ref 30.0–36.0)
MCV: 88.8 fL (ref 78.0–100.0)
Monocytes Absolute: 0.5 10*3/uL (ref 0.1–1.0)
Monocytes Relative: 9 %
Neutro Abs: 4.2 10*3/uL (ref 1.7–7.7)
Neutrophils Relative %: 69 %
PLATELETS: 243 10*3/uL (ref 150–400)
RBC: 4.74 MIL/uL (ref 3.87–5.11)
RDW: 13.5 % (ref 11.5–15.5)
WBC: 6.1 10*3/uL (ref 4.0–10.5)

## 2015-10-29 MED ORDER — RANITIDINE HCL 150 MG PO TABS
150.0000 mg | ORAL_TABLET | Freq: Two times a day (BID) | ORAL | Status: DC
  Administered 2015-10-30: 150 mg via ORAL
  Filled 2015-10-29 (×3): qty 1

## 2015-10-29 MED ORDER — HEPARIN (PORCINE) IN NACL 100-0.45 UNIT/ML-% IJ SOLN
1600.0000 [IU]/h | INTRAMUSCULAR | Status: DC
  Administered 2015-10-29: 1600 [IU]/h via INTRAVENOUS
  Filled 2015-10-29: qty 250

## 2015-10-29 MED ORDER — FAMOTIDINE 20 MG PO TABS
20.0000 mg | ORAL_TABLET | Freq: Two times a day (BID) | ORAL | Status: DC
  Filled 2015-10-29 (×2): qty 1

## 2015-10-29 MED ORDER — ACETAMINOPHEN 650 MG RE SUPP: 650.0000 mg | Freq: Four times a day (QID) | RECTAL | Status: DC | PRN

## 2015-10-29 MED ORDER — HEPARIN BOLUS VIA INFUSION
5000.0000 [IU] | Freq: Once | INTRAVENOUS | Status: AC
  Administered 2015-10-29: 5000 [IU] via INTRAVENOUS
  Filled 2015-10-29: qty 5000

## 2015-10-29 MED ORDER — LEVOTHYROXINE SODIUM 50 MCG PO TABS
50.0000 ug | ORAL_TABLET | Freq: Every day | ORAL | Status: DC
  Administered 2015-10-30: 50 ug via ORAL
  Filled 2015-10-29: qty 1

## 2015-10-29 MED ORDER — IOHEXOL 350 MG/ML SOLN
100.0000 mL | Freq: Once | INTRAVENOUS | Status: AC | PRN
  Administered 2015-10-29: 100 mL via INTRAVENOUS

## 2015-10-29 MED ORDER — VITAMIN D 1000 UNITS PO TABS
5000.0000 [IU] | ORAL_TABLET | Freq: Every day | ORAL | Status: DC
  Administered 2015-10-30: 5000 [IU] via ORAL
  Filled 2015-10-29: qty 5

## 2015-10-29 MED ORDER — ACETAMINOPHEN 325 MG PO TABS: 650.0000 mg | ORAL_TABLET | Freq: Four times a day (QID) | ORAL | Status: DC | PRN

## 2015-10-29 MED ORDER — ENOXAPARIN SODIUM 120 MG/0.8ML ~~LOC~~ SOLN
115.0000 mg | Freq: Two times a day (BID) | SUBCUTANEOUS | Status: DC
  Administered 2015-10-29 – 2015-10-30 (×2): 115 mg via SUBCUTANEOUS
  Filled 2015-10-29 (×5): qty 0.8

## 2015-10-29 MED ORDER — MELOXICAM 7.5 MG PO TABS
15.0000 mg | ORAL_TABLET | Freq: Every day | ORAL | Status: DC
  Administered 2015-10-29 – 2015-10-30 (×2): 15 mg via ORAL
  Filled 2015-10-29 (×2): qty 2

## 2015-10-29 NOTE — ED Provider Notes (Signed)
CSN: [id number]     Arrival date & time 10/29/15  [id number] History   First MD Initiated Contact with Patient 10/29/15 [phone]     Chief Complaint  Patient presents with  . Thrombosis    Thrombosis     (Consider location/radiation/quality/duration/timing/severity/associated sxs/prior Treatment) The history is provided by the patient and medical records. No language interpreter was used.     Erin Vasquez is a 60 y.o. female  with a hx of hypothyroid, asthma, GERD, uterine cancer with total hysterectomy in May 2015 (no chemo or radiation), arthritis, presents to the Emergency Department complaining of gradual, persistent, progressively worsening left calf pain onset 1 month described as a pulling sensation in the back of her left leg when walking.  She developed associated medial thigh discomfort in the last 24 hours. She has associated erythema from ankle to mid thigh without pitting edema.  He denies exogenous estrogen, recent immobilization, recent surgery or history of DVT.   Denies night sweats or weight loss.    PCP: [name]  Past Medical History  Diagnosis Date  . Thyroid disease   . Hypothyroidism   . Asthma     as child- none recently  . GERD (gastroesophageal reflux disease)     tx. Tums  . H/O hiatal hernia     hx. esophageal dilation x1  . Sleep apnea     no cpap- unable to tolerate mask  . Cancer [facility])     uterine cancer  . Arthritis     shoulders, hands, hip  . Vitamin D deficiency   . Anemia     during pregnancy   Past Surgical History  Procedure Laterality Date  . Bunionectomy Left   . Tonsillectomy    . Esophagogastroduodenoscopy (egd) with propofol N/A 08/02/2013    Procedure: ESOPHAGOGASTRODUODENOSCOPY (EGD) WITH PROPOFOL;  Surgeon: Arta Silence, MD;  Location: [facility];  Service: Endoscopy;  Laterality: N/A;  . Balloon dilation N/A 08/02/2013    Procedure: BALLOON DILATION;  Surgeon: Arta Silence, MD;  Location: [facility];  Service:  Endoscopy;  Laterality: N/A;  . Colonoscopy with propofol N/A 08/02/2013    Procedure: COLONOSCOPY WITH PROPOFOL;  Surgeon: Arta Silence, MD;  Location: [facility];  Service: Endoscopy;  Laterality: N/A;  . Abdominal hysterectomy      robotic surgery for uterine cancer  . Dilation and curettage of uterus    . Shoulder arthroscopy with debridement and bicep tendon repair Left 09/11/2014    Procedure: SHOULDER ARTHROSCOPY WITH DEBRIDEMENT AND BICEP TENDON REPAIR;  Surgeon: Renette Butters, MD;  Location: [place];  Service: Orthopedics;  Laterality: Left;  . Shoulder arthroscopy with distal clavicle resection Left 09/11/2014    Procedure: SHOULDER ARTHROSCOPY WITH DISTAL CLAVICLE RESECTION;  Surgeon: Renette Butters, MD;  Location: [place];  Service: Orthopedics;  Laterality: Left;  . Shoulder arthroscopy with subacromial decompression Left 09/11/2014    Procedure: SHOULDER ARTHROSCOPY WITH SUBACROMIAL DECOMPRESSION;  Surgeon: Renette Butters, MD;  Location: [place];  Service: Orthopedics;  Laterality: Left;   Family History  Problem Relation Age of Onset  . Transient ischemic attack Mother   . Heart attack Father   . Diabetes type II Other   . Lung cancer Maternal Grandmother    Social History  Substance Use Topics  . Smoking status: Never Smoker   . Smokeless tobacco: Former Systems developer    Types: Chew  . Alcohol Use: No   OB History    No data available  Review of Systems  Constitutional: Negative for fever, diaphoresis, appetite change, fatigue and unexpected weight change.  HENT: Negative for mouth sores.   Eyes: Negative for visual disturbance.  Respiratory: Negative for cough, chest tightness, shortness of breath and wheezing.   Cardiovascular: Negative for chest pain.  Gastrointestinal: Negative for nausea, vomiting, abdominal pain, diarrhea and constipation.  Endocrine: Negative for polydipsia, polyphagia and polyuria.  Genitourinary: Negative for dysuria, urgency, frequency  and hematuria.  Musculoskeletal: Positive for myalgias. Negative for back pain and neck stiffness.  Skin: Positive for color change. Negative for rash.  Allergic/Immunologic: Negative for immunocompromised state.  Neurological: Negative for syncope, light-headedness and headaches.  Hematological: Does not bruise/bleed easily.  Psychiatric/Behavioral: Negative for sleep disturbance. The patient is not nervous/anxious.       Allergies  Food; Other; Bee venom; and Penicillins  Home Medications   Prior to Admission medications   Medication Sig Start Date End Date Taking? Authorizing Provider  calcium carbonate (TUMS - DOSED IN MG ELEMENTAL CALCIUM) 500 MG chewable tablet Chew 3 tablets by mouth daily as needed for indigestion or heartburn.    Yes Historical Provider, MD  Cholecalciferol (VITAMIN D3) 5000 units TABS Take 5,000 Units by mouth daily.   Yes Historical Provider, MD  docusate sodium (COLACE) 100 MG capsule Take 1 capsule (100 mg total) by mouth 2 (two) times daily. Continue this while taking narcotics to help with bowel movements 09/11/14  Yes Renette Butters, MD  ibuprofen (ADVIL,MOTRIN) 200 MG tablet Take 200 mg by mouth every 6 (six) hours as needed.   Yes Historical Provider, MD  levothyroxine (SYNTHROID, LEVOTHROID) 50 MCG tablet Take 50 mcg by mouth daily before breakfast.    Yes Historical Provider, MD  meloxicam (MOBIC) 15 MG tablet Take 15 mg by mouth daily.   Yes Historical Provider, MD  oxyCODONE-acetaminophen (ROXICET) 5-325 MG per tablet Take 2 tablets by mouth every 4 (four) hours as needed. 09/11/14  Yes Renette Butters, MD  ranitidine (ZANTAC) 150 MG tablet Take 150 mg by mouth 2 (two) times daily.   Yes Historical Provider, MD  Cholecalciferol (VITAMIN D3) 2000 UNITS TABS Take 1 capsule by mouth daily.    Historical Provider, MD  ondansetron (ZOFRAN) 4 MG tablet Take 1 tablet (4 mg total) by mouth every 8 (eight) hours as needed for nausea. 09/11/14   Renette Butters, MD   BP 137/79 mmHg  Pulse 86  Temp(Src) 97.6 F (36.4 C) (Oral)  Resp 20  Ht 5\' 8"  (1.727 m)  Wt 116.574 kg  BMI 39.09 kg/m2  SpO2 98% Physical Exam  Constitutional: She appears well-developed and well-nourished. No distress.  Awake, alert, nontoxic appearance  HENT:  Head: Normocephalic and atraumatic.  Mouth/Throat: Oropharynx is clear and moist. No oropharyngeal exudate.  Eyes: Conjunctivae are normal. No scleral icterus.  Neck: Normal range of motion. Neck supple.  Cardiovascular: Normal rate, regular rhythm, normal heart sounds and intact distal pulses.   No murmur heard. Pulmonary/Chest: Effort normal and breath sounds normal. No respiratory distress. She has no wheezes.  Equal chest expansion Clear and equal breath sounds  Abdominal: Soft. Bowel sounds are normal. She exhibits no mass. There is no tenderness. There is no rebound and no guarding.  Musculoskeletal: Normal range of motion. She exhibits no edema.  Left leg: Mild erythema from the left ankle to the distal left thigh without increased warmth or induration; tenderness to palpation of the posterior calf without palpable cord, positive Homans sign Full range  of motion of left toes, ankle, knee and hip  Neurological: She is alert.  Speech is clear and goal oriented Moves extremities without ataxia  Skin: Skin is warm and dry. She is not diaphoretic. There is erythema.  Psychiatric: She has a normal mood and affect.  Nursing note and vitals reviewed.   ED Course  Procedures (including critical care time) Labs Review Labs Reviewed  COMPREHENSIVE METABOLIC PANEL - Abnormal; Notable for the following:    CO2 20 (*)    All other components within normal limits  CBC WITH DIFFERENTIAL/PLATELET    Imaging Review Ct Angio Chest Pe W/cm &/or Wo Cm  10/29/2015  CLINICAL DATA:  Shortness of breath and cough for 1 week EXAM: CT ANGIOGRAPHY CHEST WITH CONTRAST TECHNIQUE: Multidetector CT imaging of the chest  was performed using the standard protocol during bolus administration of intravenous contrast. Multiplanar CT image reconstructions and MIPs were obtained to evaluate the vascular anatomy. CONTRAST:  150mL OMNIPAQUE IOHEXOL 350 MG/ML SOLN COMPARISON:  Chest radiograph and chest CT September 29, 2005 FINDINGS: Mediastinum/Lymph Nodes: There are pulmonary emboli in multiple left and right lower lobe branches. There is pulmonary embolism in the extreme distal aspect of the left main pulmonary artery. The right ventricle to left ventricle diameter ratio is 0.8, slightly below the threshold for right heart strain. There is no demonstrable thoracic aortic aneurysm or dissection. The visualized great vessels appear unremarkable. The pericardium is not thickened. There is calcification in the left anterior descending coronary artery. The thyroid appears unremarkable. There is no appreciable thoracic adenopathy. There is a focal hiatal hernia. Lungs/Pleura: There is a calcified granuloma in the right lower lobe. There is mild bibasilar atelectasis. There is no lung edema or consolidation. Upper abdomen: Visualized upper abdominal structures appear unremarkable. Musculoskeletal: There is degenerative change in the thoracic spine. No blastic or lytic bone lesions. Review of the MIP images confirms the above findings. IMPRESSION: Bilateral pulmonary emboli, without right heart strain demonstrable. No edema or consolidation.  Calcified granuloma right lower lobe. No adenopathy. There are foci of coronary artery calcification. Critical Value/emergent results were called by telephone at the time of interpretation on 10/29/2015 at 12:17 pm to [facility] , who verbally acknowledged these results. Electronically Signed   By: Lowella Grip III M.D.   On: 10/29/2015 12:18   I have personally reviewed and evaluated these images and lab results as part of my medical decision-making.    Venous Duplex Summary:  -  Findings consistent with subacute deep vein thrombosis involving  the left profunda femoris vein, left femoral vein, left popliteal  vein, left posterial tibial vein, left peroneal vein, and left  gastrocnemius vein. - No evidence of deep vein thrombosis involving the right lower  extremity.  MDM   Final diagnoses:  DVT (deep venous thrombosis), left  Pulmonary embolism, bilateral ([place])  Obese    Mignon Pine resents with one month of left leg pain. Venous duplex done this morning shows DVT in the left profunda femoris vein, left femoral vein, left popliteal vein, left posterior tibial vein, left peroneal vein and left gastrocnemius vein.  Patient without chest pain, shortness of breath or tachycardia however with extensive DVT concern for possible PE.  In chest with bilateral PEs.  Labs reassuring. Patient without previous history. Will admit for further management.  Pt given heparin in the ED.    The patient was discussed with and seen by Dr. Venora Maples who agrees with the treatment plan.  Jarrett Soho Khyren Hing, PA-C 10/29/15 [place], MD 10/29/15 [phone]

## 2015-10-29 NOTE — Progress Notes (Addendum)
ANTICOAGULATION CONSULT NOTE - Initial Consult  Pharmacy Consult for Heparin Indication: DVT  Allergies  Allergen Reactions  . Food Anaphylaxis    Tangerines  . Other Anaphylaxis    [place] smoke  . Bee Venom Swelling  . Penicillins     UNKNOWN: CHILDHOOD ALLERGY     Patient Measurements: Height: 5\' 8"  (172.7 cm) Weight: 257 lb (116.574 kg) IBW/kg (Calculated) : 63.9 Heparin Dosing Weight: 91 kg  Vital Signs: Temp: 97.6 F (36.4 C) (02/14 1008) Temp Source: Oral (02/14 1008) BP: 137/79 mmHg (02/14 1215) Pulse Rate: 86 (02/14 1215)  Labs:  Recent Labs  10/29/15 1033  HGB 13.5  HCT 42.1  PLT 243  CREATININE 0.83    Estimated Creatinine Clearance: 96.7 mL/min (by C-G formula based on Cr of 0.83).   Medical History: Past Medical History  Diagnosis Date  . Thyroid disease   . Hypothyroidism   . Asthma     as child- none recently  . GERD (gastroesophageal reflux disease)     tx. Tums  . H/O hiatal hernia     hx. esophageal dilation x1  . Sleep apnea     no cpap- unable to tolerate mask  . Cancer [facility])     uterine cancer  . Arthritis     shoulders, hands, hip  . Vitamin D deficiency   . Anemia     during pregnancy    Medications:   (Not in a hospital admission) Scheduled:  Infusions:   Assessment: 60yo female presents with persistent worsening L calf pain. Pt was found to have DVT in L femoral, profunda femoral, popliteal, posterior tibial, peroneal and gastroc veins. Pharmacy is consulted to dose heparin for DVT.    Goal of Therapy:  Heparin level 0.3-0.7 units/ml Monitor platelets by anticoagulation protocol: Yes   Plan:  Give 5000 units bolus x 1 Start heparin infusion at 1600 units/hr Check anti-Xa level in 6 hours and daily while on heparin Continue to monitor H&H and platelets  Andrey Cota. Diona Foley, PharmD, BCPS Clinical Pharmacist Pager [phone] 10/29/2015,12:38 PM  Addendum: Changing to lovenox  Plan: Lovenox 1mg /kg Q12H F/u plans  for oral anticoagulation  Salome Arnt, PharmD, BCPS Pager # [phone] 10/29/2015 2:56 PM

## 2015-10-29 NOTE — Progress Notes (Signed)
*  PRELIMINARY RESULTS* Vascular Ultrasound Lower extremity venous duplex has been completed.  Preliminary findings: DVT noted in the left femoral, profunda femoral, popliteal, posterior tibial, peroneal, and gastroc veins. No DVT RLE.   Talked to Elnita Maxwell, [place] at Dr. Debroah Loop office. Patient taken to ED to be evaluated and have CTA. Dr. Venora Maples is expecting patient.   Landry Mellow, RDMS, RVT  10/29/2015, 9:06 AM

## 2015-10-29 NOTE — Progress Notes (Signed)
Chief Complaint: Patient was seen in consultation today for LLE DVT and PE at the request of Dr. Jola Schmidt Referring Physician(s): Dr. Jola Schmidt  History of Present Illness: Erin Vasquez is a 60 y.o. female who presented to ER with about 1 month hx of LLE edema. She has noticed more pronounced edema and some redness of her left lower leg over the past few days. She reports ome discomfort as well which has limited her mobility. She denies any recent travel, surgery, or injury. She does not smoke and is on no hormone therapy. She has been trying to eat healthier in recent months, including increasing her water intake. She was sent for a LE venous duplex study this morning, which found subacute DVT from the proximal (L)SFV, profundus vein, popliteal and tibial vein. She was subsequently sent to the ER. She then had a CTA chest which found bilateral lower lung PE without heart strain or considerable clot burden. She has been started on heparin IV drip and will be admitted by the medicine service. IR was called to consider if the pt is a candidate for thrombolytic therapy.  PMHx, meds, labs, allergies reviewed. Pt with morbid obesity, hx of uterine cancer and underwent TAH in [id number], no complications. No prior hx of DVT/PE, no known hypercoaguable conditions.  Past Medical History  Diagnosis Date  . Thyroid disease   . Hypothyroidism   . Asthma     as child- none recently  . GERD (gastroesophageal reflux disease)     tx. Tums  . H/O hiatal hernia     hx. esophageal dilation x1  . Sleep apnea     no cpap- unable to tolerate mask  . Cancer [facility])     uterine cancer  . Arthritis     shoulders, hands, hip  . Vitamin D deficiency   . Anemia     during pregnancy    Past Surgical History  Procedure Laterality Date  . Bunionectomy Left   . Tonsillectomy    . Esophagogastroduodenoscopy (egd) with propofol N/A 08/02/2013    Procedure: ESOPHAGOGASTRODUODENOSCOPY (EGD) WITH  PROPOFOL;  Surgeon: Arta Silence, MD;  Location: [facility];  Service: Endoscopy;  Laterality: N/A;  . Balloon dilation N/A 08/02/2013    Procedure: BALLOON DILATION;  Surgeon: Arta Silence, MD;  [facility];  Service: Endoscopy;  Laterality: N/A;  . Colonoscopy with propofol N/A 08/02/2013    Procedure: COLONOSCOPY WITH PROPOFOL;  Surgeon: Arta Silence, MD;  Location: [facility];  Service: Endoscopy;  Laterality: N/A;  . Abdominal hysterectomy      robotic surgery for uterine cancer  . Dilation and curettage of uterus    . Shoulder arthroscopy with debridement and bicep tendon repair Left 09/11/2014    Procedure: SHOULDER ARTHROSCOPY WITH DEBRIDEMENT AND BICEP TENDON REPAIR;  Surgeon: Renette Butters, MD;  Location: [place];  Service: Orthopedics;  Laterality: Left;  . Shoulder arthroscopy with distal clavicle resection Left 09/11/2014    Procedure: SHOULDER ARTHROSCOPY WITH DISTAL CLAVICLE RESECTION;  Surgeon: Renette Butters, MD;  Location: [place];  Service: Orthopedics;  Laterality: Left;  . Shoulder arthroscopy with subacromial decompression Left 09/11/2014    Procedure: SHOULDER ARTHROSCOPY WITH SUBACROMIAL DECOMPRESSION;  Surgeon: Renette Butters, MD;  Location: [place];  Service: Orthopedics;  Laterality: Left;    Allergies: Food; Other; Bee venom; and Penicillins  Medications: Prior to Admission medications   Medication Sig Start Date End Date Taking? Authorizing Provider  calcium carbonate (TUMS -  DOSED IN MG ELEMENTAL CALCIUM) 500 MG chewable tablet Chew 3 tablets by mouth daily as needed for indigestion or heartburn.    Yes Historical Provider, MD  Cholecalciferol (VITAMIN D3) 5000 units TABS Take 5,000 Units by mouth daily.   Yes Historical Provider, MD  docusate sodium (COLACE) 100 MG capsule Take 1 capsule (100 mg total) by mouth 2 (two) times daily. Continue this while taking narcotics to help with bowel movements 09/11/14  Yes Renette Butters, MD    ibuprofen (ADVIL,MOTRIN) 200 MG tablet Take 200 mg by mouth every 6 (six) hours as needed.   Yes Historical Provider, MD  levothyroxine (SYNTHROID, LEVOTHROID) 50 MCG tablet Take 50 mcg by mouth daily before breakfast.    Yes Historical Provider, MD  meloxicam (MOBIC) 15 MG tablet Take 15 mg by mouth daily.   Yes Historical Provider, MD  oxyCODONE-acetaminophen (ROXICET) 5-325 MG per tablet Take 2 tablets by mouth every 4 (four) hours as needed. 09/11/14  Yes Renette Butters, MD  ranitidine (ZANTAC) 150 MG tablet Take 150 mg by mouth 2 (two) times daily.   Yes Historical Provider, MD  Cholecalciferol (VITAMIN D3) 2000 UNITS TABS Take 1 capsule by mouth daily.    Historical Provider, MD  ondansetron (ZOFRAN) 4 MG tablet Take 1 tablet (4 mg total) by mouth every 8 (eight) hours as needed for nausea. 09/11/14   Renette Butters, MD     Family History  Problem Relation Age of Onset  . Transient ischemic attack Mother   . Heart attack Father   . Diabetes type II Other   . Lung cancer Maternal Grandmother     Social History   Social History  . Marital Status: Divorced    Spouse Name: N/A  . Number of Children: N/A  . Years of Education: N/A   Social History Main Topics  . Smoking status: Never Smoker   . Smokeless tobacco: Former Systems developer    Types: Chew  . Alcohol Use: No  . Drug Use: No  . Sexual Activity: Yes    Birth Control/ Protection: None   Other Topics Concern  . None   Social History Narrative     Review of Systems: A 12 point ROS discussed and pertinent positives are indicated in the HPI above.  All other systems are negative.  Review of Systems  Constitutional: Positive for activity change and fatigue. Negative for fever, chills and diaphoresis.  Respiratory: Negative for cough, chest tightness, shortness of breath and wheezing.   Cardiovascular: Positive for leg swelling. Negative for chest pain and palpitations.  Gastrointestinal: Negative for nausea, vomiting,  abdominal pain, diarrhea, blood in stool and abdominal distention.  Endocrine: Negative.   Genitourinary: Negative.   Musculoskeletal: Negative for myalgias and back pain.  Skin: Negative.   Allergic/Immunologic: Negative.   Neurological: Negative.   Psychiatric/Behavioral: Negative.     Vital Signs: BP 137/79 mmHg  Pulse 86  Temp(Src) 97.6 F (36.4 C) (Oral)  Resp 20  Ht 5\' 8"  (1.727 m)  Wt 257 lb (116.574 kg)  BMI 39.09 kg/m2  SpO2 98%  Physical Exam  Constitutional: She is oriented to person, place, and time. No distress.  Morbidly obese white female, NAD  Neck: No JVD present.  Cardiovascular: Normal rate, regular rhythm, normal heart sounds and intact distal pulses.   Pulmonary/Chest: Effort normal and breath sounds normal. No respiratory distress.  Musculoskeletal:  Bilateral Lower Extremities morbidly obese. Left: Difficult to appreciate significant edema of left leg when compared  to right. It is very soft from proximal thigh down to ankle. Foot warm. NVI  Neurological: She is alert and oriented to person, place, and time.  Skin: She is not diaphoretic.  Psychiatric: She has a normal mood and affect. Judgment normal.     Imaging: Ct Angio Chest Pe W/cm &/or Wo Cm  10/29/2015  CLINICAL DATA:  Shortness of breath and cough for 1 week EXAM: CT ANGIOGRAPHY CHEST WITH CONTRAST TECHNIQUE: Multidetector CT imaging of the chest was performed using the standard protocol during bolus administration of intravenous contrast. Multiplanar CT image reconstructions and MIPs were obtained to evaluate the vascular anatomy. CONTRAST:  178mL OMNIPAQUE IOHEXOL 350 MG/ML SOLN COMPARISON:  Chest radiograph and chest CT September 29, 2005 FINDINGS: Mediastinum/Lymph Nodes: There are pulmonary emboli in multiple left and right lower lobe branches. There is pulmonary embolism in the extreme distal aspect of the left main pulmonary artery. The right ventricle to left ventricle diameter ratio is 0.8,  slightly below the threshold for right heart strain. There is no demonstrable thoracic aortic aneurysm or dissection. The visualized great vessels appear unremarkable. The pericardium is not thickened. There is calcification in the left anterior descending coronary artery. The thyroid appears unremarkable. There is no appreciable thoracic adenopathy. There is a focal hiatal hernia. Lungs/Pleura: There is a calcified granuloma in the right lower lobe. There is mild bibasilar atelectasis. There is no lung edema or consolidation. Upper abdomen: Visualized upper abdominal structures appear unremarkable. Musculoskeletal: There is degenerative change in the thoracic spine. No blastic or lytic bone lesions. Review of the MIP images confirms the above findings. IMPRESSION: Bilateral pulmonary emboli, without right heart strain demonstrable. No edema or consolidation.  Calcified granuloma right lower lobe. No adenopathy. There are foci of coronary artery calcification. Critical Value/emergent results were called by telephone at the time of interpretation on 10/29/2015 at 12:17 pm to [facility] , who verbally acknowledged these results. Electronically Signed   By: Lowella Grip III M.D.   On: 10/29/2015 12:18    Labs:  CBC:  Recent Labs  10/29/15 1033  WBC 6.1  HGB 13.5  HCT 42.1  PLT 243    COAGS: No results for input(s): INR, APTT in the last 8760 hours.  BMP:  Recent Labs  10/29/15 1033  NA 140  K 4.1  CL 107  CO2 20*  GLUCOSE 86  BUN 14  CALCIUM 9.2  CREATININE 0.83  GFRNONAA >60  GFRAA >60    LIVER FUNCTION TESTS:  Recent Labs  10/29/15 1033  BILITOT 0.6  AST 18  ALT 15  ALKPHOS 63  PROT 7.2  ALBUMIN 3.8    TUMOR MARKERS: No results for input(s): AFPTM, CEA, CA199, CHROMGRNA in the last 8760 hours.  Assessment and Plan: LLE DVT which is likely now about 77-64 weeks old. Still appeared subacute on Duplex US.  Doubtful pt has May-Thurner syndrome given age  and presentation. Do not suspect iliac involvement given US findings and lack of phlegmasia. Currently stable on heparin gtt. She has no contraindications to thrombolysis therapy but we would not recommend this at this point. If pt remains stable for a few days on hep gtt and or clinically improves(less pain and swelling), would recommend conversion from heparin to po anticoagulation of choice. If edema and/or pain worsens despite IV heparin, could still consider catheter directed therapy/thrombolysis. Potential placement of temporary IVC filter can also be a consideration if clinically indicated, though with pt able to tolerate anticoagulation therapy  and only mild-moderate PE clot burden, this may not be necessary.  Thank you for this interesting consult.   A copy of this report was sent to the requesting provider on this date. Case discussed and reviewed with IR attending, Dr. Pascal Lux.  Electronically Signed: Ascencion Dike 10/29/2015, 2:17 PM   I spent a total of 40 Minutes  in face to face in clinical consultation, greater than 50% of which was counseling/coordinating care for LLE DVT

## 2015-10-29 NOTE — H&P (Signed)
[facility] Admission History and Physical Service Pager: [phone]  Patient name: Erin Vasquez Medical record number: [id number] Date of birth: 1956-07-09 Age: 60 y.o. Gender: female  Primary Care Provider: Barrie Lyme, FNP Consultants: none Code Status: FULL  Chief Complaint: DVT, PE  Assessment and Plan: Erin Vasquez is a 60 y.o. female presenting with DVT and PE . PMH is significant for hypothyroidism, uterine cancer, arthritis, and L rotator cuff injury.   1. DVT and bilateral PEs: LE venous US with DVT involving L profunda femoris vein, L femoral vein, L popliteal vein, L posterial tibial vein, L peroneal vein, and L gastrocnemius vein. No DVT in RLE. CTA with bilateral pulmonary emboli without demonstrable R heart strain. HR WNL; no supplemental O2 requirement. Started on heparin gtt in ED. Patient with a DVT wells score of 2 and PE wells score of 3. No true risk factors, however, patient does have a remote history of uterine cancer (s/p hysterectomy) and questionable enlarged lymph nodes. Patient hemodynamically stable.  - Admit to floor, attending Dr. Andria Frames - D/c heparin. Lovenox per pharmacy. - Continuous pulse ox - Vitals per floor protocol - Anticipate discharge tomorrow pending transition to PO anticoag  2. Hypothyroidism - Continue home Synthroid  3. Arthritis/chronic left shoulder pain - Continue home Mobic   FEN/GI: heart healthy diet Prophylaxis: Treatment dose Lovenox  Disposition: Admit to floor  History of Present Illness:  Erin Vasquez is a 60 y.o. female presenting with DVT and bilateral PE.   Patient reports that one month ago, her L calf began hurting, especially when walking. She also noticed L ankle swelling beginning at that time. The pain persisted, and one week ago she also noticed redness of the L ankle. She went to her orthopedist yesterday (whom she sees for a rotator cuff injury), who ordered LE doppler US  out of concern for DVT. [place] was performed this morning, and patient was found to have a substantial DVT in the L leg. Given the size of the DVT, CTA was also performed, which showed bilateral PE. Patient was subsequently admitted for further management.   Patient endorses some SOB, although she attributes this to nasal congestion present for the past week. Denies fevers, tachycardia, or palpitations. No personal history of clots, however her mother did have a DVT requiring 2-3 months of pharmacological treatment around 40 years ago. No recent surgeries, long trips, or immobilization otherwise.    Review Of Systems: Per HPI with the following additions: endorses cough and nasal congestion x1 wk Otherwise the remainder of the systems were negative.  Patient Active Problem List   Diagnosis Date Noted  . Pulmonary embolism ([place]) 10/29/2015    Past Medical History: Past Medical History  Diagnosis Date  . Thyroid disease   . Hypothyroidism   . Asthma     as child- none recently  . GERD (gastroesophageal reflux disease)     tx. Tums  . H/O hiatal hernia     hx. esophageal dilation x1  . Sleep apnea     no cpap- unable to tolerate mask  . Cancer [facility])     uterine cancer  . Arthritis     shoulders, hands, hip  . Vitamin D deficiency   . Anemia     during pregnancy    Past Surgical History: Past Surgical History  Procedure Laterality Date  . Bunionectomy Left   . Tonsillectomy    . Esophagogastroduodenoscopy (egd) with propofol N/A 08/02/2013  Procedure: ESOPHAGOGASTRODUODENOSCOPY (EGD) WITH PROPOFOL;  Surgeon: Arta Silence, MD;  [facility];  Service: Endoscopy;  Laterality: N/A;  . Balloon dilation N/A 08/02/2013    Procedure: BALLOON DILATION;  Surgeon: Arta Silence, MD;  [facility];  Service: Endoscopy;  Laterality: N/A;  . Colonoscopy with propofol N/A 08/02/2013    Procedure: COLONOSCOPY WITH PROPOFOL;  Surgeon: Arta Silence, MD;  [facility];  Service: Endoscopy;  Laterality: N/A;  . Abdominal hysterectomy      robotic surgery for uterine cancer  . Dilation and curettage of uterus    . Shoulder arthroscopy with debridement and bicep tendon repair Left 09/11/2014    Procedure: SHOULDER ARTHROSCOPY WITH DEBRIDEMENT AND BICEP TENDON REPAIR;  Surgeon: Renette Butters, MD;  Location: [place];  Service: Orthopedics;  Laterality: Left;  . Shoulder arthroscopy with distal clavicle resection Left 09/11/2014    Procedure: SHOULDER ARTHROSCOPY WITH DISTAL CLAVICLE RESECTION;  Surgeon: Renette Butters, MD;  Location: [place];  Service: Orthopedics;  Laterality: Left;  . Shoulder arthroscopy with subacromial decompression Left 09/11/2014    Procedure: SHOULDER ARTHROSCOPY WITH SUBACROMIAL DECOMPRESSION;  Surgeon: Renette Butters, MD;  Location: [place];  Service: Orthopedics;  Laterality: Left;   Disability for shoulder and bunions  Social History: Social History  Substance Use Topics  . Smoking status: Never Smoker   . Smokeless tobacco: Former Systems developer    Types: Chew  . Alcohol Use: No   Intermittent chewing tobacco use for 5 months at age 40   Additional social history: lives with mother Please also refer to relevant sections of EMR.  Family History: Family History  Problem Relation Age of Onset  . Transient ischemic attack Mother   . Heart attack Father   . Diabetes type II Other   . Lung cancer Maternal Grandmother    Mother - LE DVT 40 years ago  Allergies and Medications: Allergies  Allergen Reactions  . Food Anaphylaxis    Tangerines  . Other Anaphylaxis    [place] smoke  . Bee Venom Swelling  . Penicillins     UNKNOWN: CHILDHOOD ALLERGY    No current facility-administered medications on file prior to encounter.   Current Outpatient Prescriptions on File Prior to Encounter  Medication Sig Dispense Refill  . calcium carbonate (TUMS - DOSED IN MG ELEMENTAL CALCIUM) 500 MG chewable tablet Chew 3 tablets by  mouth daily as needed for indigestion or heartburn.     . docusate sodium (COLACE) 100 MG capsule Take 1 capsule (100 mg total) by mouth 2 (two) times daily. Continue this while taking narcotics to help with bowel movements 30 capsule 1  . ibuprofen (ADVIL,MOTRIN) 200 MG tablet Take 200 mg by mouth every 6 (six) hours as needed.    Marland Kitchen levothyroxine (SYNTHROID, LEVOTHROID) 50 MCG tablet Take 50 mcg by mouth daily before breakfast.     . meloxicam (MOBIC) 15 MG tablet Take 15 mg by mouth daily.    Marland Kitchen oxyCODONE-acetaminophen (ROXICET) 5-325 MG per tablet Take 2 tablets by mouth every 4 (four) hours as needed. 60 tablet 0  . Cholecalciferol (VITAMIN D3) 2000 UNITS TABS Take 1 capsule by mouth daily.    . ondansetron (ZOFRAN) 4 MG tablet Take 1 tablet (4 mg total) by mouth every 8 (eight) hours as needed for nausea. 60 tablet 0    Objective: BP 137/79 mmHg  Pulse 86  Temp(Src) 97.6 F (36.4 C) (Oral)  Resp 20  Ht 5\' 8"  (1.727 m)  Wt 257 lb (116.574 kg)  BMI 39.09 kg/m2  SpO2 98% Exam: General: lying in bed, well-appearing, well-nourished, in NAD Eyes: PERRLA ENTM: MMM, no oropharyngeal erythema or exudates Neck: supple, no lymphadenopathy Cardiovascular: RRR, no murmurs appreciated Respiratory: CTAB, good air movement, no wheezes or rhonchi Abdomen: obese, soft, non-tender, +BS MSK: L calf TTP extending above popliteal fossa; no palpable cords; Homan's sign negative Skin: mild erythema of L lateral ankle Neuro: A&Ox3, no focal deficits Psych: Appropriate mood and affect   Labs and Imaging: CBC BMET   Recent Labs Lab 10/29/15 1033  WBC 6.1  HGB 13.5  HCT 42.1  PLT 243    Recent Labs Lab 10/29/15 1033  NA 140  K 4.1  CL 107  CO2 20*  BUN 14  CREATININE 0.83  GLUCOSE 86  CALCIUM 9.2     Ct Angio Chest Pe W/cm &/or Wo Cm 10/29/2015 IMPRESSION: Bilateral pulmonary emboli, without right heart strain demonstrable. No edema or consolidation.  Calcified granuloma right lower  lobe. No adenopathy. There are foci of coronary artery calcification.   VAS [place] LOWER EXTREMITY VENOUS (DVT) BILAT 10/29/2015 SUMMARY: Findings consistent with subacute deep vein thrombosis involving the left profunda femoris vein, left femoral vein, left popliteal vein, left posterial tibial vein, left peroneal vein, and left gastrocnemius vein. No evidence of deep vein thrombosis involving the right lower extremity.  Verner Mould, MD 10/29/2015, 2:49 PM PGY-1, [facility] pager: [phone], text pages welcome  I have seen and examined the patient. I have read and agree with the above note. My changes are noted in blue.  Cordelia Poche, MD PGY-3, [facility] 10/29/2015, 3:07 PM

## 2015-10-29 NOTE — ED Notes (Signed)
Spoke with pharmacist in the ED. Stop Heparin and administer lovenox one hour after stopping heparin.

## 2015-10-29 NOTE — ED Notes (Signed)
Doctor at bedside.

## 2015-10-29 NOTE — ED Notes (Signed)
Sent from vascular lab for + DVT. Seen by PCP yesterday for left leg pain. Left leg with swelling and erythema from ankle to upper thigh. NO increased pain. ambulatory

## 2015-10-30 LAB — CBC
HEMATOCRIT: 41.3 % (ref 36.0–46.0)
HEMOGLOBIN: 13.4 g/dL (ref 12.0–15.0)
MCH: 29.1 pg (ref 26.0–34.0)
MCHC: 32.4 g/dL (ref 30.0–36.0)
MCV: 89.8 fL (ref 78.0–100.0)
Platelets: 262 10*3/uL (ref 150–400)
RBC: 4.6 MIL/uL (ref 3.87–5.11)
RDW: 13.7 % (ref 11.5–15.5)
WBC: 5.5 10*3/uL (ref 4.0–10.5)

## 2015-10-30 MED ORDER — RIVAROXABAN 15 MG PO TABS
15.0000 mg | ORAL_TABLET | Freq: Two times a day (BID) | ORAL | Status: DC
  Administered 2015-10-30: 15 mg via ORAL
  Filled 2015-10-30: qty 1

## 2015-10-30 MED ORDER — RIVAROXABAN 15 MG PO TABS: 15.0000 mg | ORAL_TABLET | Freq: Two times a day (BID) | ORAL | Status: DC

## 2015-10-30 MED ORDER — RIVAROXABAN 20 MG PO TABS: 20.0000 mg | ORAL_TABLET | Freq: Every day | ORAL | Status: DC

## 2015-10-30 NOTE — Progress Notes (Signed)
Pt discharged home in stable condition. Discharge teaching provided and all concerns addressed. Pt left unit in wheelchair together with her mom. No issues of concern to report

## 2015-10-30 NOTE — Discharge Instructions (Signed)
You were admitted to the hospital for blood clots. We have given you blood thinner medications and you are now stable to go home. The new blood thinner medicine you will be started on is called Xarelto. This prescription has been sent to your pharmacy. You will take Xarelto 15 mg twice daily with meals for 21 days, then afterwards you will take 20 mg once daily with dinner for the next 6 months. For your pain, you can take Tylenol.   If you develop increased chest pain or shortness of breath, worsening pain or swelling in your leg, please seek medical attention.  Please schedule a hospital follow up with your PCP.   It was a pleasure caring for you, take care!  Deep Vein Thrombosis A deep vein thrombosis (DVT) is a blood clot (thrombus) that usually occurs in a deep, larger vein of the lower leg or the pelvis, or in an upper extremity such as the arm. These are dangerous and can lead to serious and even life-threatening complications if the clot travels to the lungs. A DVT can damage the valves in your leg veins so that instead of flowing upward, the blood pools in the lower leg. This is called post-thrombotic syndrome, and it can result in pain, swelling, discoloration, and sores on the leg. CAUSES A DVT is caused by the formation of a blood clot in your leg, pelvis, or arm. Usually, several things contribute to the formation of blood clots. A clot may develop when:  Your blood flow slows down.  Your vein becomes damaged in some way.  You have a condition that makes your blood clot more easily. RISK FACTORS A DVT is more likely to develop in:  People who are older, especially over 46 years of age.  People who are overweight (obese).  People who sit or lie still for a long time, such as during long-distance travel (over 4 hours), bed rest, hospitalization, or during recovery from certain medical conditions like a stroke.  People who do not engage in much physical activity (sedentary  lifestyle).  People who have chronic breathing disorders.  People who have a personal or family history of blood clots or blood clotting disease.  People who have peripheral vascular disease (PVD), diabetes, or some types of cancer.  People who have heart disease, especially if the person had a recent heart attack or has congestive heart failure.  People who have neurological diseases that affect the legs (leg paresis).  People who have had a traumatic injury, such as breaking a hip or leg.  People who have recently had major or lengthy surgery, especially on the hip, knee, or abdomen.  People who have had a central line placed inside a large vein.  People who take medicines that contain the hormone estrogen. These include birth control pills and hormone replacement therapy.  Pregnancy or during childbirth or the postpartum period.  Long plane flights (over 8 hours). SIGNS AND SYMPTOMS Symptoms of a DVT can include:   Swelling of your leg or arm, especially if one side is much worse.  Warmth and redness of your leg or arm, especially if one side is much worse.  Pain in your arm or leg. If the clot is in your leg, symptoms may be more noticeable or worse when you stand or walk.  A feeling of pins and needles, if the clot is in the arm. The symptoms of a DVT that has traveled to the lungs (pulmonary embolism, PE) usually start suddenly  and include:  Shortness of breath while active or at rest.  Coughing or coughing up blood or blood-tinged mucus.  Chest pain that is often worse with deep breaths.  Rapid or irregular heartbeat.  Feeling light-headed or dizzy.  Fainting.  Feeling anxious.  Sweating. There may also be pain and swelling in a leg if that is where the blood clot started. These symptoms may represent a serious problem that is an emergency. Do not wait to see if the symptoms will go away. Get medical help right away. Call your local emergency services (911 in  the [place] Do not drive yourself to the hospital. DIAGNOSIS Your health care provider will take a medical history and perform a physical exam. You may also have other tests, including:  Blood tests to assess the clotting properties of your blood.  Imaging tests, such as CT, ultrasound, MRI, X-ray, and other tests to see if you have clots anywhere in your body. TREATMENT After a DVT is identified, it can be treated. The type of treatment that you receive depends on many factors, such as the cause of your DVT, your risk for bleeding or developing more clots, and other medical conditions that you have. Sometimes, a combination of treatments is necessary. Treatment options may be combined and include:  Monitoring the blood clot with ultrasound.  Taking medicines by mouth, such as newer blood thinners (anticoagulants), thrombolytics, or warfarin.  Taking anticoagulant medicine by injection or through an IV tube.  Wearing compression stockings or using different types ofdevices.  Surgery (rare) to remove the blood clot or to place a filter in your abdomen to stop the blood clot from traveling to your lungs. Treatments for a DVT are often divided into immediate treatment and long-term treatment (up to 3 months after DVT). You can work with your health care provider to choose the treatment program that is best for you. HOME CARE INSTRUCTIONS If you are taking a newer oral anticoagulant:  Take the medicine every single day at the same time each day.  Understand what foods and drugs interact with this medicine.  Understand that there are no regular blood tests required when using this medicine.  Understand the side effects of this medicine, including excessive bruising or bleeding. Ask your health care provider or pharmacist about other possible side effects. If you are taking warfarin:  Understand how to take warfarin and know which foods can affect how warfarin works in Insurance claims handler.  Understand that it is dangerous to take too much or too little warfarin. Too much warfarin increases the risk of bleeding. Too little warfarin continues to allow the risk for blood clots.  Follow your PT and INR blood testing schedule. The PT and INR results allow your health care provider to adjust your dose of warfarin. It is very important that you have your PT and INR tested as often as told by your health care provider.  Avoid major changes in your diet, or tell your health care provider before you change your diet. Arrange a visit with a registered dietitian to answer your questions. Many foods, especially foods that are high in vitamin K, can interfere with warfarin and affect the PT and INR results. Eat a consistent amount of foods that are high in vitamin K, such as:  Spinach, kale, broccoli, cabbage, collard greens, turnip greens, Brussels sprouts, peas, cauliflower, seaweed, and parsley.  Beef liver and pork liver.  Green tea.  Soybean oil.  Tell your health care provider about  any and all medicines, vitamins, and supplements that you take, including aspirin and other over-the-counter anti-inflammatory medicines. Be especially cautious with aspirin and anti-inflammatory medicines. Do not take those before you ask your health care provider if it is safe to do so. This is important because many medicines can interfere with warfarin and affect the PT and INR results.  Do not start or stop taking any over-the-counter or prescription medicine unless your health care provider or pharmacist tells you to do so. If you take warfarin, you will also need to do these things:  Hold pressure over cuts for longer than usual.  Tell your dentist and other health care providers that you are taking warfarin before you have any procedures in which bleeding may occur.  Avoid alcohol or drink very small amounts. Tell your health care provider if you change your alcohol intake.  Do not use  tobacco products, including cigarettes, chewing tobacco, and e-cigarettes. If you need help quitting, ask your health care provider.  Avoid contact sports. General Instructions  Take over-the-counter and prescription medicines only as told by your health care provider. Anticoagulant medicines can have side effects, including easy bruising and difficulty stopping bleeding. If you are prescribed an anticoagulant, you will also need to do these things:  Hold pressure over cuts for longer than usual.  Tell your dentist and other health care providers that you are taking anticoagulants before you have any procedures in which bleeding may occur.  Avoid contact sports.  Wear a medical alert bracelet or carry a medical alert card that says you have had a PE.  Ask your health care provider how soon you can go back to your normal activities. Stay active to prevent new blood clots from forming.  Make sure to exercise while traveling or when you have been sitting or standing for a long period of time. It is very important to exercise. Exercise your legs by walking or by tightening and relaxing your leg muscles often. Take frequent walks.  Wear compression stockings as told by your health care provider to help prevent more blood clots from forming.  Do not use tobacco products, including cigarettes, chewing tobacco, and e-cigarettes. If you need help quitting, ask your health care provider.  Keep all follow-up appointments with your health care provider. This is important. PREVENTION Take these actions to decrease your risk of developing another DVT:  Exercise regularly. For at least 30 minutes every day, engage in:  Activity that involves moving your arms and legs.  Activity that encourages good blood flow through your body by increasing your heart rate.  Exercise your arms and legs every hour during long-distance travel (over 4 hours). Drink plenty of water and avoid drinking alcohol while  traveling.  Avoid sitting or lying in bed for long periods of time without moving your legs.  Maintain a weight that is appropriate for your height. Ask your health care provider what weight is healthy for you.  If you are a woman who is over 96 years of age, avoid unnecessary use of medicines that contain estrogen. These include birth control pills.  Do not smoke, especially if you take estrogen medicines. If you need help quitting, ask your health care provider. If you are hospitalized, prevention measures may include:  Early walking after surgery, as soon as your health care provider says that it is safe.  Receiving anticoagulants to prevent blood clots.If you cannot take anticoagulants, other options may be available, such as wearing compression stockings or  using different types of devices. SEEK IMMEDIATE MEDICAL CARE IF:  You have new or increased pain, swelling, or redness in an arm or leg.  You have numbness or tingling in an arm or leg.  You have shortness of breath while active or at rest.  You have chest pain.  You have a rapid or irregular heartbeat.  You feel light-headed or dizzy.  You cough up blood.  You notice blood in your vomit, bowel movement, or urine. These symptoms may represent a serious problem that is an emergency. Do not wait to see if the symptoms will go away. Get medical help right away. Call your local emergency services (911 in the [place] Do not drive yourself to the hospital.   This information is not intended to replace advice given to you by your health care provider. Make sure you discuss any questions you have with your health care provider.   Document Released: 08/31/2005 Document Revised: 05/22/2015 Document Reviewed: 12/26/2014 Elsevier Interactive Patient Education 2016 [employer] Pulmonary Embolism A pulmonary embolism (PE) is a sudden blockage or decrease of blood flow in one lung or both lungs. Most blockages come from a blood  clot that travels from the legs or the pelvis to the lungs. PE is a dangerous and potentially life-threatening condition if it is not treated right away. CAUSES A pulmonary embolism occurs most commonly when a blood clot travels from one of your veins to your lungs. Rarely, PE is caused by air, fat, amniotic fluid, or part of a tumor traveling through your veins to your lungs. RISK FACTORS A PE is more likely to develop in:  People who smoke.  People who areolder, especially over 6 years of age.  People who are overweight (obese).  People who sit or lie still for a long time, such as during long-distance travel (over 4 hours), bed rest, hospitalization, or during recovery from certain medical conditions like a stroke.  People who do not engage in much physical activity (sedentary lifestyle).  People who have chronic breathing disorders.  People whohave a personal or family history of blood clots or blood clotting disease.  People whohave peripheral vascular disease (PVD), diabetes, or some types of cancer.  People who haveheart disease, especially if the person had a recent heart attack or has congestive heart failure.  People who have neurological diseases that affect the legs (leg paresis).  People who have had a traumatic injury, such as breaking a hip or leg.  People whohave recently had major or lengthy surgery, especially on the hip, knee, or abdomen.  People who have hada central line placed inside a large vein.  People who takemedicines that contain the hormone estrogen. These include birth control pills and hormone replacement therapy.  Pregnancy or during childbirth or the postpartum period. SIGNS AND SYMPTOMS  The symptoms of a PE usually start suddenly and include:  Shortness of breath while active or at rest.  Coughing or coughing up blood or blood-tinged mucus.  Chest pain that is often worse with deep breaths.  Rapid or irregular  heartbeat.  Feeling light-headed or dizzy.  Fainting.  Feelinganxious.  Sweating. There may also be pain and swelling in a leg if that is where the blood clot started. These symptoms may represent a serious problem that is an emergency. Do not wait to see if the symptoms will go away. Get medical help right away. Call your local emergency services (911 in the [place] Do not drive yourself to the hospital.  DIAGNOSIS Your health care provider will take a medical history and perform a physical exam. You may also have other tests, including:  Blood tests to assess the clotting properties of your blood, assess oxygen levels in your blood, and find blood clots.  Imaging tests, such as CT, ultrasound, MRI, X-ray, and other tests to see if you have clots anywhere in your body.  An electrocardiogram (ECG) to look for heart strain from blood clots in the lungs. TREATMENT The main goals of PE treatment are:  To stop a blood clot from growing larger.  To stop new blood clots from forming. The type of treatment that you receive depends on many factors, such as the cause of your PE, your risk for bleeding or developing more clots, and other medical conditions that you have. Sometimes, a combination of treatments is necessary. This condition may be treated with:  Medicines, including newer oral blood thinners (anticoagulants), warfarin, low molecular weight heparins, thrombolytics, or heparins.  Wearing compression stockings or using different types of devices.  Surgery (rare) to remove the blood clot or to place a filter in your abdomen to stop the blood clot from traveling to your lungs. Treatments for a PE are often divided into immediate treatment, long-term treatment (up to 3 months after PE), and extended treatment (more than 3 months after PE). Your treatment may continue for several months. This is called maintenance therapy, and it is used to prevent the forming of new blood clots. You can  work with your health care provider to choose the treatment program that is best for you. What are anticoagulants? Anticoagulants are medicines that treat PEs. They can stop current blood clots from growing and stop new clots from forming. They cannot dissolve existing clots. Your body dissolves clots by itself over time. Anticoagulants are given by mouth, by injection, or through an IV tube. What are thrombolytics? Thrombolytics are clot-dissolving medicines that are used to dissolve a PE. They carry a high risk of bleeding, so they tend to be used only in severe cases or if you have very low blood pressure. HOME CARE INSTRUCTIONS If you are taking a newer oral anticoagulant:  Take the medicine every single day at the same time each day.  Understand what foods and drugs interact with this medicine.  Understand that there are no regular blood tests required when using this medicine.  Understandthe side effects of this medicine, including excessive bruising or bleeding. Ask your health care provider or pharmacist about other possible side effects. If you are taking warfarin:  Understand how to take warfarin and know which foods can affect how warfarin works in Veterinary surgeon.  Understand that it is dangerous to taketoo much or too little warfarin. Too much warfarin increases the risk of bleeding. Too little warfarin continues to allow the risk for blood clots.  Follow your PT and INR blood testing schedule. The PT and INR results allow your health care provider to adjust your dose of warfarin. It is very important that you have your PT and INR tested as often as told by your health care provider.  Avoid major changes in your diet, or tell your health care provider before you change your diet. Arrange a visit with a registered dietitian to answer your questions. Many foods, especially foods that are high in vitamin K, can interfere with warfarin and affect the PT and INR results. Eat a  consistent amount of foods that are high in vitamin K, such as:  Spinach,  kale, broccoli, cabbage, collard greens, turnip greens, Brussels sprouts, peas, cauliflower, seaweed, and parsley.  Beef liver and pork liver.  Green tea.  Soybean oil.  Tell your health care provider about any and all medicines, vitamins, and supplements that you take, including aspirin and other over-the-counter anti-inflammatory medicines. Be especially cautious with aspirin and anti-inflammatory medicines. Do not take those before you ask your health care provider if it is safe to do so. This is important because many medicines can interfere with warfarin and affect the PT and INR results.  Do not start or stop taking any over-the-counter or prescription medicine unless your health care provider or pharmacist tells you to do so. If you take warfarin, you will also need to do these things:  Hold pressure over cuts for longer than usual.  Tell your dentist and other health care providers that you are taking warfarin before you have any procedures in which bleeding may occur.  Avoid alcohol or drink very small amounts. Tell your health care provider if you change your alcohol intake.  Do not use tobacco products, including cigarettes, chewing tobacco, and e-cigarettes. If you need help quitting, ask your health care provider.  Avoid contact sports. General Instructions  Take over-the-counter and prescription medicines only as told by your health care provider. Anticoagulant medicines can have side effects, including easy bruising and difficulty stopping bleeding. If you are prescribed an anticoagulant, you will also need to do these things:  Hold pressure over cuts for longer than usual.  Tell your dentist and other health care providers that you are taking anticoagulants before you have any procedures in which bleeding may occur.  Avoid contact sports.  Wear a medical alert bracelet or carry a medical alert  card that says you have had a PE.  Ask your health care provider how soon you can go back to your normal activities. Stay active to prevent new blood clots from forming.  Make sure to exercise while traveling or when you have been sitting or standing for a long period of time. It is very important to exercise. Exercise your legs by walking or by tightening and relaxing your leg muscles often. Take frequent walks.  Wear compression stockings as told by your health care provider to help prevent more blood clots from forming.  Do not use tobacco products, including cigarettes, chewing tobacco, and e-cigarettes. If you need help quitting, ask your health care provider.  Keep all follow-up appointments with your health care provider. This is important. PREVENTION Take these actions to decrease your risk of developing another PE:  Exercise regularly. For at least 30 minutes every day, engage in:  Activity that involves moving your arms and legs.  Activity that encourages good blood flow through your body by increasing your heart rate.  Exercise your arms and legs every hour during long-distance travel (over 4 hours). Drink plenty of water and avoid drinking alcohol while traveling.  Avoid sitting or lying in bed for long periods of time without moving your legs.  Maintain a weight that is appropriate for your height. Ask your health care provider what weight is healthy for you.  If you are a woman who is over 61 years of age, avoid unnecessary use of medicines that contain estrogen. These include birth control pills.  Do not smoke, especially if you take estrogen medicines. If you need help quitting, ask your health care provider.  If you are at very high risk for PE, wear compression stockings.  If  you recently had a PE, have regularly scheduled ultrasound testing on your legs to check for new blood clots. If you are hospitalized, prevention measures may include:  Early walking after  surgery, as soon as your health care provider says that it is safe.  Receiving anticoagulants to prevent blood clots. If you cannot take anticoagulants, other options may be available, such as wearing compression stockings or using different types of devices. SEEK IMMEDIATE MEDICAL CARE IF:  You have new or increased pain, swelling, or redness in an arm or leg.  You have numbness or tingling in an arm or leg.  You have shortness of breath while active or at rest.  You have chest pain.  You have a rapid or irregular heartbeat.  You feel light-headed or dizzy.  You cough up blood.  You notice blood in your vomit, bowel movement, or urine.  You have a fever. These symptoms may represent a serious problem that is an emergency. Do not wait to see if the symptoms will go away. Get medical help right away. Call your local emergency services (911 in the [place] Do not drive yourself to the hospital.   This information is not intended to replace advice given to you by your health care provider. Make sure you discuss any questions you have with your health care provider.   Document Released: 08/28/2000 Document Revised: 05/22/2015 Document Reviewed: 12/26/2014 Elsevier Interactive Patient Education [employer].

## 2015-10-30 NOTE — Discharge Summary (Signed)
[facility] Discharge Summary  Patient name: Erin Vasquez Medical record number: [id number] Date of birth: 08/22/1956 Age: 60 y.o. Gender: female Date of Admission: 10/29/2015  Date of Discharge: 10/30/15 Admitting Physician: Zenia Resides, MD  Primary Care Provider: Barrie Lyme, FNP Consultants: none  Indication for Hospitalization: DVT and PE  Discharge Diagnoses/Problem List:  Patient Active Problem List   Diagnosis Date Noted  . Pulmonary embolism ([place]) 10/29/2015  . Obese   . Pulmonary embolism, bilateral ([place])   . Thyroid activity decreased   . Arthritis   . Left shoulder pain    Disposition: Home  Discharge Condition: Stable  Discharge Exam: see previous progress note  Brief Hospital Course:  Erin Vasquez is a 60 y.o. female who presented with DVT and PE . PMH is significant for hypothyroidism, uterine cancer, arthritis, and L rotator cuff injury.   DVT and bilateral PEs:  LE venous US with DVT involving L profunda femoris vein, L femoral vein, L popliteal vein, L posterial tibial vein, L peroneal vein, and L gastrocnemius vein. No DVT in RLE. CTA with bilateral pulmonary emboli without demonstrable R heart strain. HR WNL; no supplemental O2 was required. Started on heparin gtt in ED. Patient remained hemodynamically stable. Was placed on Lovenox after Heparin. Prior to discharge, patient was transitioned to [place].   All other chronic medical conditions stable throughout admission and managed with home regimens.   Issues for Follow Up:  1. Anticoagulation: Will take Xarelto 15 mg BID for next 21 days (Until 11/20/15). Starting on 11/21/15, patient will take Xarelto 20 mg daily for next 6 months.  Significant Procedures: none  Significant Labs and Imaging:   Recent Labs Lab 10/29/15 1033 10/30/15 0554  WBC 6.1 5.5  HGB 13.5 13.4  HCT 42.1 41.3  PLT 243 262    Recent Labs Lab 10/29/15 1033  NA 140  K 4.1  CL 107   CO2 20*  GLUCOSE 86  BUN 14  CREATININE 0.83  CALCIUM 9.2  ALKPHOS 63  AST 18  ALT 15  ALBUMIN 3.8    Ct Angio Chest Pe W/cm &/or Wo Cm  10/29/2015  CLINICAL DATA:  Shortness of breath and cough for 1 week EXAM: CT ANGIOGRAPHY CHEST WITH CONTRAST TECHNIQUE: Multidetector CT imaging of the chest was performed using the standard protocol during bolus administration of intravenous contrast. Multiplanar CT image reconstructions and MIPs were obtained to evaluate the vascular anatomy. CONTRAST:  116mL OMNIPAQUE IOHEXOL 350 MG/ML SOLN COMPARISON:  Chest radiograph and chest CT September 29, 2005 FINDINGS: Mediastinum/Lymph Nodes: There are pulmonary emboli in multiple left and right lower lobe branches. There is pulmonary embolism in the extreme distal aspect of the left main pulmonary artery. The right ventricle to left ventricle diameter ratio is 0.8, slightly below the threshold for right heart strain. There is no demonstrable thoracic aortic aneurysm or dissection. The visualized great vessels appear unremarkable. The pericardium is not thickened. There is calcification in the left anterior descending coronary artery. The thyroid appears unremarkable. There is no appreciable thoracic adenopathy. There is a focal hiatal hernia. Lungs/Pleura: There is a calcified granuloma in the right lower lobe. There is mild bibasilar atelectasis. There is no lung edema or consolidation. Upper abdomen: Visualized upper abdominal structures appear unremarkable. Musculoskeletal: There is degenerative change in the thoracic spine. No blastic or lytic bone lesions. Review of the MIP images confirms the above findings. IMPRESSION: Bilateral pulmonary emboli, without right heart strain demonstrable.  No edema or consolidation.  Calcified granuloma right lower lobe. No adenopathy. There are foci of coronary artery calcification. Critical Value/emergent results were called by telephone at the time of interpretation on 10/29/2015 at  12:17 pm to [facility] , who verbally acknowledged these results. Electronically Signed   By: Lowella Grip III M.D.   On: 10/29/2015 12:18     Results/Tests Pending at Time of Discharge: none  Discharge Medications:    Medication List    STOP taking these medications        ibuprofen 200 MG tablet  Commonly known as:  ADVIL,MOTRIN      TAKE these medications        calcium carbonate 500 MG chewable tablet  Commonly known as:  TUMS - dosed in mg elemental calcium  Chew 3 tablets by mouth daily as needed for indigestion or heartburn.     docusate sodium 100 MG capsule  Commonly known as:  COLACE  Take 1 capsule (100 mg total) by mouth 2 (two) times daily. Continue this while taking narcotics to help with bowel movements     levothyroxine 50 MCG tablet  Commonly known as:  SYNTHROID, LEVOTHROID  Take 50 mcg by mouth daily before breakfast.     meloxicam 15 MG tablet  Commonly known as:  MOBIC  Take 15 mg by mouth daily.     ondansetron 4 MG tablet  Commonly known as:  ZOFRAN  Take 1 tablet (4 mg total) by mouth every 8 (eight) hours as needed for nausea.     oxyCODONE-acetaminophen 5-325 MG tablet  Commonly known as:  ROXICET  Take 2 tablets by mouth every 4 (four) hours as needed.     ranitidine 150 MG tablet  Commonly known as:  ZANTAC  Take 150 mg by mouth 2 (two) times daily.     Rivaroxaban 15 MG Tabs tablet  Commonly known as:  XARELTO  Take 1 tablet (15 mg total) by mouth 2 (two) times daily with a meal.     rivaroxaban 20 MG Tabs tablet  Commonly known as:  XARELTO  Take 1 tablet (20 mg total) by mouth daily with supper.  Start taking on:  11/21/2015     Vitamin D3 5000 units Tabs  Take 5,000 Units by mouth daily.        Discharge Instructions: Please refer to Patient Instructions section of EMR for full details.  Patient was counseled important signs and symptoms that should prompt return to medical care, changes in medications,  dietary instructions, activity restrictions, and follow up appointments.   Follow-Up Appointments: Follow-up Information    Schedule an appointment as soon as possible for a visit with Barrie Lyme, FNP.   Specialty:  Nurse Practitioner   Contact information:   [place] [address] [phone]       Carlyle Dolly, MD 10/30/2015, 5:28 PM PGY-1, [place]

## 2015-10-30 NOTE — Progress Notes (Signed)
Family Medicine Teaching Service Daily Progress Note Intern Pager: [phone]  Patient name: Erin Vasquez Medical record number: [id number] Date of birth: March 18, 1956 Age: 60 y.o. Gender: female  Primary Care Provider: Barrie Lyme, FNP Consultants: none  Code Status: full  Assessment and Plan: Erin Vasquez is a 60 y.o. female presenting with DVT and PE . PMH is significant for hypothyroidism, uterine cancer, arthritis, and L rotator cuff injury.   1. DVT and bilateral PEs: LE venous US with DVT involving L profunda femoris vein, L femoral vein, L popliteal vein, L posterial tibial vein, L peroneal vein, and L gastrocnemius vein. No DVT in RLE. CTA with bilateral pulmonary emboli without demonstrable R heart strain. HR WNL; no supplemental O2 requirement. Started on heparin gtt in ED. Patient with a DVT wells score of 2 and PE wells score of 3. No true risk factors, however, patient does have a remote history of uterine cancer (s/p hysterectomy) and questionable enlarged lymph nodes. Patient hemodynamically stable.  - Transition from Lovenox to Xarelto  - Continuous pulse ox - Vitals per floor protocol - Anticipate discharge today  2. Hypothyroidism - Continue home Synthroid  3. Arthritis/chronic left shoulder pain - Continue home Mobic   FEN/GI: heart healthy diet Prophylaxis: Treatment dose Lovenox  Disposition: Home  Subjective:  - No acute events overnight - Patient still having pain in left leg and has to walk on heel - Still has mild pleuritic chest pain  Objective: Temp:  [97.5 F (36.4 C)-98.2 F (36.8 C)] 97.5 F (36.4 C) (02/15 0445) Pulse Rate:  [76-96] 76 (02/15 0445) Resp:  [15-20] 16 (02/15 0445) BP: (135-154)/(70-95) 146/83 mmHg (02/15 0445) SpO2:  [92 %-98 %] 95 % (02/15 0445) Weight:  [257 lb (116.574 kg)] 257 lb (116.574 kg) (02/14 0911) Physical Exam: General: In NAD, initially ambulating in room Cardiovascular: RRR, normal s1 and s2, no  murmurs Respiratory: normal work of breathing, CTAB Abdomen: soft, non tender, non distended, no masses, normal bowel sounds Extremities: no edema  Laboratory:  Recent Labs Lab 10/29/15 1033 10/30/15 0554  WBC 6.1 5.5  HGB 13.5 13.4  HCT 42.1 41.3  PLT 243 262    Recent Labs Lab 10/29/15 1033  NA 140  K 4.1  CL 107  CO2 20*  BUN 14  CREATININE 0.83  CALCIUM 9.2  PROT 7.2  BILITOT 0.6  ALKPHOS 63  ALT 15  AST 18  GLUCOSE 86     Imaging/Diagnostic Tests: Ct Angio Chest Pe W/cm &/or Wo Cm  10/29/2015  CLINICAL DATA:  Shortness of breath and cough for 1 week EXAM: CT ANGIOGRAPHY CHEST WITH CONTRAST TECHNIQUE: Multidetector CT imaging of the chest was performed using the standard protocol during bolus administration of intravenous contrast. Multiplanar CT image reconstructions and MIPs were obtained to evaluate the vascular anatomy. CONTRAST:  164mL OMNIPAQUE IOHEXOL 350 MG/ML SOLN COMPARISON:  Chest radiograph and chest CT September 29, 2005 FINDINGS: Mediastinum/Lymph Nodes: There are pulmonary emboli in multiple left and right lower lobe branches. There is pulmonary embolism in the extreme distal aspect of the left main pulmonary artery. The right ventricle to left ventricle diameter ratio is 0.8, slightly below the threshold for right heart strain. There is no demonstrable thoracic aortic aneurysm or dissection. The visualized great vessels appear unremarkable. The pericardium is not thickened. There is calcification in the left anterior descending coronary artery. The thyroid appears unremarkable. There is no appreciable thoracic adenopathy. There is a focal hiatal hernia. Lungs/Pleura: There  is a calcified granuloma in the right lower lobe. There is mild bibasilar atelectasis. There is no lung edema or consolidation. Upper abdomen: Visualized upper abdominal structures appear unremarkable. Musculoskeletal: There is degenerative change in the thoracic spine. No blastic or lytic  bone lesions. Review of the MIP images confirms the above findings. IMPRESSION: Bilateral pulmonary emboli, without right heart strain demonstrable. No edema or consolidation.  Calcified granuloma right lower lobe. No adenopathy. There are foci of coronary artery calcification. Critical Value/emergent results were called by telephone at the time of interpretation on 10/29/2015 at 12:17 pm to [facility] , who verbally acknowledged these results. Electronically Signed   By: Lowella Grip III M.D.   On: 10/29/2015 12:18     Carlyle Dolly, MD 10/30/2015, 7:14 AM PGY-1, [place] Intern pager: [phone], text pages welcome

## 2015-11-03 ENCOUNTER — Emergency Department (HOSPITAL_COMMUNITY)
Admission: EM | Admit: 2015-11-03 | Discharge: 2015-11-03 | Disposition: A | Discharge status: Home / Self Care | Payer: Medicaid Other | Attending: Emergency Medicine | Admitting: Emergency Medicine

## 2015-11-03 ENCOUNTER — Encounter (HOSPITAL_COMMUNITY): Payer: Self-pay | Admitting: *Deleted

## 2015-11-03 DIAGNOSIS — K219 Gastro-esophageal reflux disease without esophagitis: Secondary | ICD-10-CM | POA: Diagnosis not present

## 2015-11-03 DIAGNOSIS — Z791 Long term (current) use of non-steroidal anti-inflammatories (NSAID): Secondary | ICD-10-CM | POA: Diagnosis not present

## 2015-11-03 DIAGNOSIS — I8290 Acute embolism and thrombosis of unspecified vein: Secondary | ICD-10-CM | POA: Insufficient documentation

## 2015-11-03 DIAGNOSIS — G8929 Other chronic pain: Secondary | ICD-10-CM | POA: Insufficient documentation

## 2015-11-03 DIAGNOSIS — Z86711 Personal history of pulmonary embolism: Secondary | ICD-10-CM | POA: Insufficient documentation

## 2015-11-03 DIAGNOSIS — M1990 Unspecified osteoarthritis, unspecified site: Secondary | ICD-10-CM | POA: Diagnosis not present

## 2015-11-03 DIAGNOSIS — Z79899 Other long term (current) drug therapy: Secondary | ICD-10-CM | POA: Diagnosis not present

## 2015-11-03 DIAGNOSIS — J45909 Unspecified asthma, uncomplicated: Secondary | ICD-10-CM | POA: Insufficient documentation

## 2015-11-03 DIAGNOSIS — E039 Hypothyroidism, unspecified: Secondary | ICD-10-CM | POA: Insufficient documentation

## 2015-11-03 DIAGNOSIS — Z9981 Dependence on supplemental oxygen: Secondary | ICD-10-CM | POA: Insufficient documentation

## 2015-11-03 DIAGNOSIS — Z8542 Personal history of malignant neoplasm of other parts of uterus: Secondary | ICD-10-CM | POA: Insufficient documentation

## 2015-11-03 DIAGNOSIS — M79662 Pain in left lower leg: Secondary | ICD-10-CM | POA: Diagnosis present

## 2015-11-03 DIAGNOSIS — G4730 Sleep apnea, unspecified: Secondary | ICD-10-CM | POA: Diagnosis not present

## 2015-11-03 DIAGNOSIS — Z880 Allergy status to penicillin: Secondary | ICD-10-CM | POA: Insufficient documentation

## 2015-11-03 NOTE — ED Provider Notes (Signed)
CSN: [id number]     Arrival date & time 11/03/15  1107 History   First MD Initiated Contact with Patient 11/03/15 1208     Chief Complaint  Patient presents with  . Leg Pain     (Consider location/radiation/quality/duration/timing/severity/associated sxs/prior Treatment) HPI   Erin Vasquez is a 60 y.o. female who presents for evaluation of pain, swelling and redness in her left lower leg. This concerned her for worsening blood clot so she came here for evaluation. She was discharged from the hospital, 3 days ago after treatment for left leg DVT associated with pulmonary emboli. She is taking xarelto, as prescribed. She denies persistent chest pain, shortness of breath, weakness or dizziness at this time. She has been fairly ambulatory over the last 2 days, but taking time to elevate her legs. Been no fever, chills, nausea or vomiting. There are no other known modifying factors.   Past Medical History  Diagnosis Date  . Thyroid disease   . Hypothyroidism   . GERD (gastroesophageal reflux disease)     tx. Tums  . H/O hiatal hernia     hx. esophageal dilation x1  . Vitamin D deficiency   . Anemia     during pregnancy  . DVT (deep venous thrombosis) ([place]) 10/29/2015    LLE  . Bilateral pulmonary embolism ([place]) 10/29/2015  . Childhood asthma   . Sleep apnea     "CPAP ordered; insurance wouldn't cover" (10/29/2015)  . History of esophageal dilatation   . Arthritis     "all over"  . Chronic back pain   . Uterine cancer [facility])     uterine cancer   Past Surgical History  Procedure Laterality Date  . Bunionectomy with hammertoe reconstruction Left ~ 2005  . Tonsillectomy    . Esophagogastroduodenoscopy (egd) with propofol N/A 08/02/2013    Procedure: ESOPHAGOGASTRODUODENOSCOPY (EGD) WITH PROPOFOL;  Surgeon: Arta Silence, MD;  Location: [facility] ENDOSCOPY;  Service: Endoscopy;  Laterality: N/A;  . Balloon dilation N/A 08/02/2013    Procedure: BALLOON DILATION;  Surgeon: Arta Silence,  MD;  Location: [facility] ENDOSCOPY;  Service: Endoscopy;  Laterality: N/A;  . Colonoscopy with propofol N/A 08/02/2013    Procedure: COLONOSCOPY WITH PROPOFOL;  Surgeon: Arta Silence, MD;  Location: [facility] ENDOSCOPY;  Service: Endoscopy;  Laterality: N/A;  . Shoulder arthroscopy with debridement and bicep tendon repair Left 09/11/2014    Procedure: SHOULDER ARTHROSCOPY WITH DEBRIDEMENT AND BICEP TENDON REPAIR;  Surgeon: Renette Butters, MD;  Location: [facility];  Service: Orthopedics;  Laterality: Left;  . Shoulder arthroscopy with distal clavicle resection Left 09/11/2014    Procedure: SHOULDER ARTHROSCOPY WITH DISTAL CLAVICLE RESECTION;  Surgeon: Renette Butters, MD;  Location: [facility];  Service: Orthopedics;  Laterality: Left;  . Shoulder arthroscopy with subacromial decompression Left 09/11/2014    Procedure: SHOULDER ARTHROSCOPY WITH SUBACROMIAL DECOMPRESSION;  Surgeon: Renette Butters, MD;  Location: [facility];  Service: Orthopedics;  Laterality: Left;  . Abdominal hysterectomy  01/23/2014    robotic surgery for uterine cancer  . Dilation and curettage of uterus  X 2    S/P miscarriage  . Esophagogastroduodenoscopy (egd) with esophageal dilation  ~ 2003   Family History  Problem Relation Age of Onset  . Transient ischemic attack Mother   . Heart attack Father   . Diabetes type II Other   . Lung cancer Maternal Grandmother    Social History  Substance Use Topics  . Smoking status: Never Smoker   . Smokeless tobacco: Former Systems developer  Types: Chew     Comment: "chewed as a teen  . Alcohol Use: Yes     Comment: "quit drinking by age 69; social drinker only"   OB History    No data available     Review of Systems  All other systems reviewed and are negative.     Allergies  Food; Other; Bee venom; and Penicillins  Home Medications   Prior to Admission medications   Medication Sig Start Date End Date Taking? Authorizing Provider  levothyroxine (SYNTHROID, LEVOTHROID) 50 MCG tablet Take 50  mcg by mouth daily before breakfast.    Yes Historical Provider, MD  meloxicam (MOBIC) 15 MG tablet Take 15 mg by mouth daily.   Yes Historical Provider, MD  ranitidine (ZANTAC) 150 MG tablet Take 150 mg by mouth 2 (two) times daily.   Yes Historical Provider, MD  Rivaroxaban (XARELTO) 15 MG TABS tablet Take 1 tablet (15 mg total) by mouth 2 (two) times daily with a meal. 10/30/15 11/20/15 Yes Carlyle Dolly, MD   BP 123/68 mmHg  Pulse 81  Temp(Src) 98.3 F (36.8 C) (Oral)  Resp 20  SpO2 95% Physical Exam  Constitutional: She is oriented to person, place, and time. She appears well-developed and well-nourished.  HENT:  Head: Normocephalic and atraumatic.  Right Ear: External ear normal.  Left Ear: External ear normal.  Eyes: Conjunctivae and EOM are normal. Pupils are equal, round, and reactive to light.  Neck: Normal range of motion and phonation normal. Neck supple.  Cardiovascular: Normal rate, regular rhythm and normal heart sounds.   Pulmonary/Chest: Effort normal and breath sounds normal. She exhibits no bony tenderness.  Abdominal: Soft. There is no tenderness.  Musculoskeletal: Normal range of motion.  Mild swelling left lower leg. Very minimal light red discoloration of the anterior left lower leg. No calf or popliteal tenderness, or cord. Normal pulse left dorsal pedis. Normal capillary refill toes of left foot.  Neurological: She is alert and oriented to person, place, and time. No cranial nerve deficit or sensory deficit. She exhibits normal muscle tone. Coordination normal.  Skin: Skin is warm, dry and intact.  Psychiatric: She has a normal mood and affect. Her behavior is normal. Judgment and thought content normal.  Nursing note and vitals reviewed.   ED Course  Procedures (including critical care time)  Medications - No data to display  Patient Vitals for the past 24 hrs:  BP Temp Temp src Pulse Resp SpO2  11/03/15 1500 123/68 mmHg - - 81 20 95 %  11/03/15 1336  156/97 mmHg - - 80 11 93 %  11/03/15 1125 142/89 mmHg 98.3 F (36.8 C) Oral 92 18 93 %    At discharge- Reevaluation with update and discussion. After initial assessment and treatment, an updated evaluation reveals no additional complaints. No change in status of left leg. Findings discussed with patient and mother, all questions answered. [place] Review Labs Reviewed - No data to display  Imaging Review No results found. I have personally reviewed and evaluated these images and lab results as part of my medical decision-making.   EKG Interpretation   Date/Time:  Sunday November 03 2015 13:35:18 EST Ventricular Rate:  80 PR Interval:  156 QRS Duration: 99 QT Interval:  371 QTC Calculation: 428 R Axis:   52 Text Interpretation:  Sinus rhythm since last tracing no significant  change Confirmed by Eulis Foster  MD, Paytin Ramakrishnan [id number]) on 11/03/2015 2:04:48 PM  MDM   Final diagnoses:  Venous thromboembolism (VTE)     DVT left leg, and PE, currently under treatment appropriately with xarelto. No evidence for complicated features at this time.  Nursing Notes Reviewed/ Care Coordinated Applicable Imaging Reviewed Interpretation of Laboratory Data incorporated into ED treatment  The patient appears reasonably screened and/or stabilized for discharge and I doubt any other medical condition or other [facility] requiring further screening, evaluation, or treatment in the ED at this time prior to discharge.  Plan: Home Medications- usual; Home Treatments- rest, with periodic exercise; return here if the recommended treatment, does not improve the symptoms; Recommended follow up- PCP 1-2 weeks     Daleen Bo, MD 11/03/15 1810

## 2015-11-03 NOTE — Discharge Instructions (Signed)
Continue to do your daily activities as usual. For pain, swelling or redness in your leg, elevate, until it improves. Return here if needed, for problems.  Deep Vein Thrombosis A deep vein thrombosis (DVT) is a blood clot (thrombus) that usually occurs in a deep, larger vein of the lower leg or the pelvis, or in an upper extremity such as the arm. These are dangerous and can lead to serious and even life-threatening complications if the clot travels to the lungs. A DVT can damage the valves in your leg veins so that instead of flowing upward, the blood pools in the lower leg. This is called post-thrombotic syndrome, and it can result in pain, swelling, discoloration, and sores on the leg. CAUSES A DVT is caused by the formation of a blood clot in your leg, pelvis, or arm. Usually, several things contribute to the formation of blood clots. A clot may develop when:  Your blood flow slows down.  Your vein becomes damaged in some way.  You have a condition that makes your blood clot more easily. RISK FACTORS A DVT is more likely to develop in:  People who are older, especially over 51 years of age.  People who are overweight (obese).  People who sit or lie still for a long time, such as during long-distance travel (over 4 hours), bed rest, hospitalization, or during recovery from certain medical conditions like a stroke.  People who do not engage in much physical activity (sedentary lifestyle).  People who have chronic breathing disorders.  People who have a personal or family history of blood clots or blood clotting disease.  People who have peripheral vascular disease (PVD), diabetes, or some types of cancer.  People who have heart disease, especially if the person had a recent heart attack or has congestive heart failure.  People who have neurological diseases that affect the legs (leg paresis).  People who have had a traumatic injury, such as breaking a hip or leg.  People who  have recently had major or lengthy surgery, especially on the hip, knee, or abdomen.  People who have had a central line placed inside a large vein.  People who take medicines that contain the hormone estrogen. These include birth control pills and hormone replacement therapy.  Pregnancy or during childbirth or the postpartum period.  Long plane flights (over 8 hours). SIGNS AND SYMPTOMS Symptoms of a DVT can include:   Swelling of your leg or arm, especially if one side is much worse.  Warmth and redness of your leg or arm, especially if one side is much worse.  Pain in your arm or leg. If the clot is in your leg, symptoms may be more noticeable or worse when you stand or walk.  A feeling of pins and needles, if the clot is in the arm. The symptoms of a DVT that has traveled to the lungs (pulmonary embolism, PE) usually start suddenly and include:  Shortness of breath while active or at rest.  Coughing or coughing up blood or blood-tinged mucus.  Chest pain that is often worse with deep breaths.  Rapid or irregular heartbeat.  Feeling light-headed or dizzy.  Fainting.  Feeling anxious.  Sweating. There may also be pain and swelling in a leg if that is where the blood clot started. These symptoms may represent a serious problem that is an emergency. Do not wait to see if the symptoms will go away. Get medical help right away. Call your local emergency services (911 in  the [place] Do not drive yourself to the hospital. DIAGNOSIS Your health care provider will take a medical history and perform a physical exam. You may also have other tests, including:  Blood tests to assess the clotting properties of your blood.  Imaging tests, such as CT, ultrasound, MRI, X-ray, and other tests to see if you have clots anywhere in your body. TREATMENT After a DVT is identified, it can be treated. The type of treatment that you receive depends on many factors, such as the cause of your DVT,  your risk for bleeding or developing more clots, and other medical conditions that you have. Sometimes, a combination of treatments is necessary. Treatment options may be combined and include:  Monitoring the blood clot with ultrasound.  Taking medicines by mouth, such as newer blood thinners (anticoagulants), thrombolytics, or warfarin.  Taking anticoagulant medicine by injection or through an IV tube.  Wearing compression stockings or using different types ofdevices.  Surgery (rare) to remove the blood clot or to place a filter in your abdomen to stop the blood clot from traveling to your lungs. Treatments for a DVT are often divided into immediate treatment and long-term treatment (up to 3 months after DVT). You can work with your health care provider to choose the treatment program that is best for you. HOME CARE INSTRUCTIONS If you are taking a newer oral anticoagulant:  Take the medicine every single day at the same time each day.  Understand what foods and drugs interact with this medicine.  Understand that there are no regular blood tests required when using this medicine.  Understand the side effects of this medicine, including excessive bruising or bleeding. Ask your health care provider or pharmacist about other possible side effects. If you are taking warfarin:  Understand how to take warfarin and know which foods can affect how warfarin works in Veterinary surgeon.  Understand that it is dangerous to take too much or too little warfarin. Too much warfarin increases the risk of bleeding. Too little warfarin continues to allow the risk for blood clots.  Follow your PT and INR blood testing schedule. The PT and INR results allow your health care provider to adjust your dose of warfarin. It is very important that you have your PT and INR tested as often as told by your health care provider.  Avoid major changes in your diet, or tell your health care provider before you change your  diet. Arrange a visit with a registered dietitian to answer your questions. Many foods, especially foods that are high in vitamin K, can interfere with warfarin and affect the PT and INR results. Eat a consistent amount of foods that are high in vitamin K, such as:  Spinach, kale, broccoli, cabbage, collard greens, turnip greens, Brussels sprouts, peas, cauliflower, seaweed, and parsley.  Beef liver and pork liver.  Green tea.  Soybean oil.  Tell your health care provider about any and all medicines, vitamins, and supplements that you take, including aspirin and other over-the-counter anti-inflammatory medicines. Be especially cautious with aspirin and anti-inflammatory medicines. Do not take those before you ask your health care provider if it is safe to do so. This is important because many medicines can interfere with warfarin and affect the PT and INR results.  Do not start or stop taking any over-the-counter or prescription medicine unless your health care provider or pharmacist tells you to do so. If you take warfarin, you will also need to do these things:  Hold pressure  over cuts for longer than usual.  Tell your dentist and other health care providers that you are taking warfarin before you have any procedures in which bleeding may occur.  Avoid alcohol or drink very small amounts. Tell your health care provider if you change your alcohol intake.  Do not use tobacco products, including cigarettes, chewing tobacco, and e-cigarettes. If you need help quitting, ask your health care provider.  Avoid contact sports. General Instructions  Take over-the-counter and prescription medicines only as told by your health care provider. Anticoagulant medicines can have side effects, including easy bruising and difficulty stopping bleeding. If you are prescribed an anticoagulant, you will also need to do these things:  Hold pressure over cuts for longer than usual.  Tell your dentist and  other health care providers that you are taking anticoagulants before you have any procedures in which bleeding may occur.  Avoid contact sports.  Wear a medical alert bracelet or carry a medical alert card that says you have had a PE.  Ask your health care provider how soon you can go back to your normal activities. Stay active to prevent new blood clots from forming.  Make sure to exercise while traveling or when you have been sitting or standing for a long period of time. It is very important to exercise. Exercise your legs by walking or by tightening and relaxing your leg muscles often. Take frequent walks.  Wear compression stockings as told by your health care provider to help prevent more blood clots from forming.  Do not use tobacco products, including cigarettes, chewing tobacco, and e-cigarettes. If you need help quitting, ask your health care provider.  Keep all follow-up appointments with your health care provider. This is important. PREVENTION Take these actions to decrease your risk of developing another DVT:  Exercise regularly. For at least 30 minutes every day, engage in:  Activity that involves moving your arms and legs.  Activity that encourages good blood flow through your body by increasing your heart rate.  Exercise your arms and legs every hour during long-distance travel (over 4 hours). Drink plenty of water and avoid drinking alcohol while traveling.  Avoid sitting or lying in bed for long periods of time without moving your legs.  Maintain a weight that is appropriate for your height. Ask your health care provider what weight is healthy for you.  If you are a woman who is over 4 years of age, avoid unnecessary use of medicines that contain estrogen. These include birth control pills.  Do not smoke, especially if you take estrogen medicines. If you need help quitting, ask your health care provider. If you are hospitalized, prevention measures may  include:  Early walking after surgery, as soon as your health care provider says that it is safe.  Receiving anticoagulants to prevent blood clots.If you cannot take anticoagulants, other options may be available, such as wearing compression stockings or using different types of devices. SEEK IMMEDIATE MEDICAL CARE IF:  You have new or increased pain, swelling, or redness in an arm or leg.  You have numbness or tingling in an arm or leg.  You have shortness of breath while active or at rest.  You have chest pain.  You have a rapid or irregular heartbeat.  You feel light-headed or dizzy.  You cough up blood.  You notice blood in your vomit, bowel movement, or urine. These symptoms may represent a serious problem that is an emergency. Do not wait to see if  the symptoms will go away. Get medical help right away. Call your local emergency services (911 in the [place] Do not drive yourself to the hospital.   This information is not intended to replace advice given to you by your health care provider. Make sure you discuss any questions you have with your health care provider.   Document Released: 08/31/2005 Document Revised: 05/22/2015 Document Reviewed: 12/26/2014 Elsevier Interactive Patient Education [employer].

## 2015-11-03 NOTE — ED Notes (Signed)
Pt states that she was told to return to the hospital if she began having pain or swelling. Pt reports pain swelling, and pink coloration to left leg. Pt is on xerelto.

## 2015-11-04 ENCOUNTER — Other Ambulatory Visit: Payer: Self-pay | Admitting: *Deleted

## 2015-11-04 DIAGNOSIS — I83893 Varicose veins of bilateral lower extremities with other complications: Secondary | ICD-10-CM

## 2015-11-22 ENCOUNTER — Telehealth: Payer: Self-pay | Admitting: Oncology

## 2015-11-22 ENCOUNTER — Encounter: Payer: Self-pay | Admitting: Oncology

## 2015-11-22 NOTE — Telephone Encounter (Signed)
Pt called in to confirm appt 12/03/15

## 2015-11-28 ENCOUNTER — Ambulatory Visit: Payer: Medicaid Other | Admitting: Oncology

## 2015-12-03 ENCOUNTER — Telehealth: Payer: Self-pay | Admitting: Oncology

## 2015-12-03 ENCOUNTER — Ambulatory Visit (HOSPITAL_BASED_OUTPATIENT_CLINIC_OR_DEPARTMENT_OTHER): Payer: Medicaid Other

## 2015-12-03 ENCOUNTER — Ambulatory Visit (HOSPITAL_BASED_OUTPATIENT_CLINIC_OR_DEPARTMENT_OTHER): Payer: Medicaid Other | Admitting: Oncology

## 2015-12-03 VITALS — BP 150/87 | HR 75 | Temp 98.6°F | Resp 18 | Ht 68.0 in | Wt 297.3 lb

## 2015-12-03 DIAGNOSIS — I82432 Acute embolism and thrombosis of left popliteal vein: Secondary | ICD-10-CM

## 2015-12-03 DIAGNOSIS — I82890 Acute embolism and thrombosis of other specified veins: Secondary | ICD-10-CM

## 2015-12-03 DIAGNOSIS — I2699 Other pulmonary embolism without acute cor pulmonale: Secondary | ICD-10-CM

## 2015-12-03 DIAGNOSIS — I82412 Acute embolism and thrombosis of left femoral vein: Secondary | ICD-10-CM | POA: Diagnosis not present

## 2015-12-03 DIAGNOSIS — Z801 Family history of malignant neoplasm of trachea, bronchus and lung: Secondary | ICD-10-CM

## 2015-12-03 DIAGNOSIS — I82442 Acute embolism and thrombosis of left tibial vein: Secondary | ICD-10-CM | POA: Diagnosis not present

## 2015-12-03 NOTE — Telephone Encounter (Signed)
per pof to sch pt appt-gave pt copy of avs °

## 2015-12-03 NOTE — Telephone Encounter (Signed)
per pof to sch pt appt-sent pt back to lab-gave avs

## 2015-12-03 NOTE — Consult Note (Signed)
Reason for Referral: Pulmonary embolism.   HPI: 60 year old woman currently of [place] where she have lived for the last 30 years. She is a pleasant woman with history of obesity, osteoarthritis and endometrial cancer. She had multiple surgeries in the past including shoulder operation as well as a foot operation. The most recent of which was in December 2015. She presented in February 2017 with left leg edema as well as thigh pain and lower extremity Dopplers on 10/29/2015 showed left femoral, profundus femoral, popliteal, posterior tibial and peroneal vein thrombosis. She also underwent a CT scan of the chest showed bilateral pulmonary emboli without right heart strain. She was treated with Lovenox and transitioned into Xarelto as an outpatient. She has been on it for the last month without any major complications. She denied any bleeding complication related to that. She never had any thrombosis in the past including no DVT, PE or superficial phlebitis. She never had any pregnancy complications she had one pregnancy and have a 109 year old son. She denied any prolonged time of immobility prior to her recent episode. She does describe being in a sitting position for 2 hours of time and potential could have contributed to compression of her leg veins. She denied any long travel or any obvious trauma. She did notice to the lower extremity edema have decreased but still have some residual swelling. She is trying to be more active including walking inside and outside the house.  She denied any headaches blurred vision, syncope or seizures. She denied any fevers, chills, sweats or weight loss. She denies any chest pain, difficulty breathing, cough or wheezing. She denied any hemoptysis or hematemesis. She does have residual lower extremity edema but no palpitation, orthopnea or dyspnea exertion. She does not report any nausea, vomiting or abdominal pain. She does not report any constipation or diarrhea. She  does not report any frequency urgency or hesitancy. Remaining review of systems unremarkable.   Past Medical History  Diagnosis Date  . Thyroid disease   . Hypothyroidism   . GERD (gastroesophageal reflux disease)     tx. Tums  . H/O hiatal hernia     hx. esophageal dilation x1  . Vitamin D deficiency   . Anemia     during pregnancy  . DVT (deep venous thrombosis) ([place]) 10/29/2015    LLE  . Bilateral pulmonary embolism ([place]) 10/29/2015  . Childhood asthma   . Sleep apnea     "CPAP ordered; insurance wouldn't cover" (10/29/2015)  . History of esophageal dilatation   . Arthritis     "all over"  . Chronic back pain   . Uterine cancer [facility])     uterine cancer  :  Past Surgical History  Procedure Laterality Date  . Bunionectomy with hammertoe reconstruction Left ~ 2005  . Tonsillectomy    . Esophagogastroduodenoscopy (egd) with propofol N/A 08/02/2013    Procedure: ESOPHAGOGASTRODUODENOSCOPY (EGD) WITH PROPOFOL;  Surgeon: Arta Silence, MD;  Location: [facility];  Service: Endoscopy;  Laterality: N/A;  . Balloon dilation N/A 08/02/2013    Procedure: BALLOON DILATION;  Surgeon: Arta Silence, MD;  Location: [facility];  Service: Endoscopy;  Laterality: N/A;  . Colonoscopy with propofol N/A 08/02/2013    Procedure: COLONOSCOPY WITH PROPOFOL;  Surgeon: Arta Silence, MD;  Location: [facility];  Service: Endoscopy;  Laterality: N/A;  . Shoulder arthroscopy with debridement and bicep tendon repair Left 09/11/2014    Procedure: SHOULDER ARTHROSCOPY WITH DEBRIDEMENT AND BICEP TENDON REPAIR;  Surgeon: Renette Butters, MD;  Location:  [place] OR;  Service: Orthopedics;  Laterality: Left;  . Shoulder arthroscopy with distal clavicle resection Left 09/11/2014    Procedure: SHOULDER ARTHROSCOPY WITH DISTAL CLAVICLE RESECTION;  Surgeon: Renette Butters, MD;  Location: [place];  Service: Orthopedics;  Laterality: Left;  . Shoulder arthroscopy with subacromial decompression Left 09/11/2014     Procedure: SHOULDER ARTHROSCOPY WITH SUBACROMIAL DECOMPRESSION;  Surgeon: Renette Butters, MD;  Location: [place];  Service: Orthopedics;  Laterality: Left;  . Abdominal hysterectomy  01/23/2014    robotic surgery for uterine cancer  . Dilation and curettage of uterus  X 2    S/P miscarriage  . Esophagogastroduodenoscopy (egd) with esophageal dilation  ~ 2003  :   Current outpatient prescriptions:  .  Cholecalciferol (VITAMIN D3) 5000 units TABS, Take 1 tablet by mouth daily., Disp: , Rfl:  .  clobetasol cream (TEMOVATE) [id number] %, Apply 1 application topically as directed., Disp: , Rfl:  .  levothyroxine (SYNTHROID, LEVOTHROID) 50 MCG tablet, Take 50 mcg by mouth daily before breakfast. , Disp: , Rfl:  .  meloxicam (MOBIC) 15 MG tablet, Take 15 mg by mouth daily., Disp: , Rfl:  .  ranitidine (ZANTAC) 150 MG tablet, Take 150 mg by mouth 2 (two) times daily., Disp: , Rfl:  .  Rivaroxaban (XARELTO) 15 MG TABS tablet, Take 1 tablet (15 mg total) by mouth 2 (two) times daily with a meal., Disp: 42 tablet, Rfl: 0:  Allergies  Allergen Reactions  . Bee Venom Swelling and Other (See Comments)    Patient is allergic to bee venom, mosquitos, wasps, yellow jackets and bees. Wherever she gets stung that area swells, turns hot and has fevers.    . Other Anaphylaxis and Shortness Of Breath    [place] smoke  . Food Other (See Comments)    Tangerines. Unknown childhood allergy.   . Penicillins Other (See Comments)    UNKNOWN: CHILDHOOD ALLERGY   :  Family History  Problem Relation Age of Onset  . Transient ischemic attack Mother   . Heart attack Father   . Diabetes type II Other   . Lung cancer Maternal Grandmother   :  Social History   Social History  . Marital Status: Divorced    Spouse Name: N/A  . Number of Children: N/A  . Years of Education: N/A   Occupational History  . Not on file.   Social History Main Topics  . Smoking status: Never Smoker   . Smokeless tobacco: Former Systems developer     Types: Chew     Comment: "chewed as a teen  . Alcohol Use: Yes     Comment: "quit drinking by age 42; social drinker only"  . Drug Use: No  . Sexual Activity: Not Currently    Birth Control/ Protection: None   Other Topics Concern  . Not on file   Social History Narrative  :  Pertinent items are noted in HPI.  Exam: ECOG 1 Blood pressure 150/87, pulse 75, temperature 98.6 F (37 C), temperature source Oral, resp. rate 18, height 5\' 8"  (1.727 m), weight 297 lb 4.8 oz (134.854 kg), SpO2 96 %. General appearance: alert and cooperative appeared without distress. Head: Normocephalic, without obvious abnormality Throat: lips, mucosa, and tongue normal; teeth and gums normal no oral ulcers or lesions. Neck: no adenopathy Back: negative Resp: clear to auscultation bilaterally Chest wall: no tenderness Cardio: regular rate and rhythm, S1, S2 normal, no murmur, click, rub or gallop GI: soft, non-tender; bowel sounds normal;  no masses,  no organomegaly Extremities: Slight edema noted on the left more than the right. Pulses: 2+ and symmetric Skin: Skin color, texture, turgor normal. No rashes or lesions Lymph nodes: Cervical, supraclavicular, and axillary nodes normal.  CBC    Component Value Date/Time   WBC 5.5 10/30/2015 0554   RBC 4.60 10/30/2015 0554   HGB 13.4 10/30/2015 0554   HCT 41.3 10/30/2015 0554   PLT 262 10/30/2015 0554   MCV 89.8 10/30/2015 0554   MCH 29.1 10/30/2015 0554   MCHC 32.4 10/30/2015 0554   RDW 13.7 10/30/2015 0554   LYMPHSABS 0.9 10/29/2015 1033   MONOABS 0.5 10/29/2015 1033   EOSABS 0.4 10/29/2015 1033   BASOSABS 0.0 10/29/2015 1033      Chemistry      Component Value Date/Time   NA 140 10/29/2015 1033   K 4.1 10/29/2015 1033   CL 107 10/29/2015 1033   CO2 20* 10/29/2015 1033   BUN 14 10/29/2015 1033   CREATININE 0.83 10/29/2015 1033      Component Value Date/Time   CALCIUM 9.2 10/29/2015 1033   ALKPHOS 63 10/29/2015 1033   AST 18  10/29/2015 1033   ALT 15 10/29/2015 1033   BILITOT 0.6 10/29/2015 1033     EXAM: CT ANGIOGRAPHY CHEST WITH CONTRAST  TECHNIQUE: Multidetector CT imaging of the chest was performed using the standard protocol during bolus administration of intravenous contrast. Multiplanar CT image reconstructions and MIPs were obtained to evaluate the vascular anatomy.  CONTRAST: 146mL OMNIPAQUE IOHEXOL 350 MG/ML SOLN  COMPARISON: Chest radiograph and chest CT September 29, 2005  FINDINGS: Mediastinum/Lymph Nodes: There are pulmonary emboli in multiple left and right lower lobe branches. There is pulmonary embolism in the extreme distal aspect of the left main pulmonary artery. The right ventricle to left ventricle diameter ratio is 0.8, slightly below the threshold for right heart strain. There is no demonstrable thoracic aortic aneurysm or dissection. The visualized great vessels appear unremarkable. The pericardium is not thickened. There is calcification in the left anterior descending coronary artery. The thyroid appears unremarkable. There is no appreciable thoracic adenopathy. There is a focal hiatal hernia.  Lungs/Pleura: There is a calcified granuloma in the right lower lobe. There is mild bibasilar atelectasis. There is no lung edema or consolidation.  Upper abdomen: Visualized upper abdominal structures appear unremarkable.  Musculoskeletal: There is degenerative change in the thoracic spine. No blastic or lytic bone lesions.  Review of the MIP images confirms the above findings.  IMPRESSION: Bilateral pulmonary emboli, without right heart strain demonstrable.  No edema or consolidation. Calcified granuloma right lower lobe.  No adenopathy.  There are foci of coronary artery calcification.  Critical Value/emergent results were called by telephone at the time of interpretation on 10/29/2015 at 12:17 pm to [facility] , who verbally acknowledged  these results.     Assessment and Plan:    60 year old woman with the following issues:  1. Left lower extremity deep vein thrombosis involving multiple veins as well as bilateral pulmonary emboli diagnosed in February 2017. There is no clear-cut provoking episode that her risk factors include obesity and arthritis and slight decrease in her mobility. She denied any personal history or family history of thrombosis. She denied any pregnancy complications. She had multiple surgeries in the past including hysterectomy without any previous postoperative thrombosis.  The natural course of thrombophilia was discussed with the patient as well as those risk factors. Inherited the thrombophilia although unlikely it warrants investigation. Acquired  thrombophilia such as a lupus anticoagulant would also be a possibility. Obtaining a hypercoagulable panel would be reasonable in this setting and she is agreeable to proceed with that.  The duration of anticoagulation would be at least 6 months and possibly indefinite period she had a lot of risk factors for developing current thrombosis including this unprovoked episode, obesity and possible mobility. It is possible to consider stopping Xarelto after 6 months is her thrombophilia profile is negative as well as she makes steps to decrease some of these risk factors.  The plan is to return in 6 months and discuss discontinuation therapy at that time.  2. History of endometrial cancer: It is unlikely this is contributed to her cancer. Although I did encourage age-appropriate cancer screening as well as follow-up with her GYN oncology for surveillance examination.  3. Follow-up: Will be in 6 months sooner if needed to.

## 2015-12-03 NOTE — Progress Notes (Signed)
Please see consult note.  

## 2015-12-05 LAB — CARDIOLIPIN ANTIBODIES, IGG, IGM, IGA: Anticardiolipin Ab,IgM,Qn: <9 MPL U/mL (ref 0–12)

## 2015-12-05 LAB — LUPUS ANTICOAGULANT PANEL
DRVVT CONFIRM: 1.3 ratio — AB (ref 0.8–1.2)
DRVVT MIX: 53.7 s — AB (ref 0.0–44.0)
DRVVT: 68.3 s — AB (ref 0.0–44.0)
Hexagonal Phase Phospholipid: 5 s (ref 0–11)
PTT-LA Mix: 43.9 s — ABNORMAL HIGH (ref 0.0–40.6)
PTT-LA: 48.9 s — AB (ref 0.0–43.6)

## 2015-12-05 LAB — BETA-2-GLYCOPROTEIN I ABS, IGG/M/A
Beta-2 Glyco 1 IgA: <9 GPI IgA units (ref 0–25)
Beta-2 Glyco 1 IgM: <9 GPI IgM units (ref 0–32)
Beta-2 Glycoprotein I Ab, IgG: <9 GPI IgG units (ref 0–20)

## 2015-12-05 LAB — ANTITHROMBIN III: ANTITHROMBIN ACTIVITY: 131 % (ref 75–135)

## 2015-12-05 LAB — PROTEIN S ACTIVITY: PROTEIN S ACTIVITY: 197 % — AB (ref 63–140)

## 2015-12-05 LAB — PROTEIN C ACTIVITY: PROTEIN C ACTIVITY: 116 % (ref 73–180)

## 2015-12-05 LAB — PROTEIN S, TOTAL: Protein S, Total: 139 % (ref 60–150)

## 2015-12-06 LAB — PROTEIN C, TOTAL: Protein C Antigen: 71 % (ref 60–150)

## 2015-12-09 LAB — FACTOR 5 LEIDEN

## 2015-12-09 LAB — PROTHROMBIN GENE MUTATION

## 2015-12-16 ENCOUNTER — Other Ambulatory Visit: Payer: Self-pay | Admitting: Nurse Practitioner

## 2015-12-16 DIAGNOSIS — Z1231 Encounter for screening mammogram for malignant neoplasm of breast: Secondary | ICD-10-CM

## 2015-12-17 ENCOUNTER — Encounter: Payer: No Typology Code available for payment source | Admitting: Vascular Surgery

## 2015-12-17 ENCOUNTER — Encounter (HOSPITAL_COMMUNITY): Payer: No Typology Code available for payment source

## 2015-12-31 ENCOUNTER — Ambulatory Visit
Admission: RE | Admit: 2015-12-31 | Discharge: 2015-12-31 | Disposition: A | Discharge status: Home / Self Care | Payer: Medicaid Other | Source: Ambulatory Visit | Attending: Nurse Practitioner | Admitting: Nurse Practitioner

## 2015-12-31 DIAGNOSIS — Z1231 Encounter for screening mammogram for malignant neoplasm of breast: Secondary | ICD-10-CM

## 2016-01-03 ENCOUNTER — Encounter: Payer: Self-pay | Admitting: Vascular Surgery

## 2016-02-06 ENCOUNTER — Emergency Department (HOSPITAL_COMMUNITY): Payer: Medicaid Other

## 2016-02-06 ENCOUNTER — Encounter (HOSPITAL_COMMUNITY): Payer: Self-pay | Admitting: Nurse Practitioner

## 2016-02-06 ENCOUNTER — Inpatient Hospital Stay (HOSPITAL_COMMUNITY)
Admission: EM | Admit: 2016-02-06 | Discharge: 2016-02-14 | DRG: 853 | Disposition: A | Discharge status: Home / Self Care | Payer: Medicaid Other | Attending: General Surgery | Admitting: General Surgery

## 2016-02-06 DIAGNOSIS — Z720 Tobacco use: Secondary | ICD-10-CM | POA: Diagnosis not present

## 2016-02-06 DIAGNOSIS — Z79899 Other long term (current) drug therapy: Secondary | ICD-10-CM | POA: Diagnosis not present

## 2016-02-06 DIAGNOSIS — E860 Dehydration: Secondary | ICD-10-CM | POA: Diagnosis present

## 2016-02-06 DIAGNOSIS — E878 Other disorders of electrolyte and fluid balance, not elsewhere classified: Secondary | ICD-10-CM | POA: Diagnosis present

## 2016-02-06 DIAGNOSIS — K3532 Acute appendicitis with perforation and localized peritonitis, without abscess: Secondary | ICD-10-CM | POA: Insufficient documentation

## 2016-02-06 DIAGNOSIS — Z8542 Personal history of malignant neoplasm of other parts of uterus: Secondary | ICD-10-CM | POA: Diagnosis not present

## 2016-02-06 DIAGNOSIS — R1110 Vomiting, unspecified: Secondary | ICD-10-CM

## 2016-02-06 DIAGNOSIS — I8390 Asymptomatic varicose veins of unspecified lower extremity: Secondary | ICD-10-CM | POA: Diagnosis present

## 2016-02-06 DIAGNOSIS — Z6841 Body Mass Index (BMI) 40.0 and over, adult: Secondary | ICD-10-CM | POA: Diagnosis not present

## 2016-02-06 DIAGNOSIS — Z880 Allergy status to penicillin: Secondary | ICD-10-CM

## 2016-02-06 DIAGNOSIS — D72829 Elevated white blood cell count, unspecified: Secondary | ICD-10-CM

## 2016-02-06 DIAGNOSIS — E871 Hypo-osmolality and hyponatremia: Secondary | ICD-10-CM | POA: Diagnosis not present

## 2016-02-06 DIAGNOSIS — N179 Acute kidney failure, unspecified: Secondary | ICD-10-CM

## 2016-02-06 DIAGNOSIS — D6862 Lupus anticoagulant syndrome: Secondary | ICD-10-CM | POA: Diagnosis present

## 2016-02-06 DIAGNOSIS — E039 Hypothyroidism, unspecified: Secondary | ICD-10-CM | POA: Diagnosis present

## 2016-02-06 DIAGNOSIS — M1990 Unspecified osteoarthritis, unspecified site: Secondary | ICD-10-CM | POA: Diagnosis present

## 2016-02-06 DIAGNOSIS — M549 Dorsalgia, unspecified: Secondary | ICD-10-CM | POA: Diagnosis present

## 2016-02-06 DIAGNOSIS — A419 Sepsis, unspecified organism: Secondary | ICD-10-CM | POA: Diagnosis present

## 2016-02-06 DIAGNOSIS — Z86711 Personal history of pulmonary embolism: Secondary | ICD-10-CM

## 2016-02-06 DIAGNOSIS — R030 Elevated blood-pressure reading, without diagnosis of hypertension: Secondary | ICD-10-CM | POA: Diagnosis present

## 2016-02-06 DIAGNOSIS — K353 Acute appendicitis with localized peritonitis: Secondary | ICD-10-CM | POA: Diagnosis present

## 2016-02-06 DIAGNOSIS — Z885 Allergy status to narcotic agent status: Secondary | ICD-10-CM | POA: Diagnosis not present

## 2016-02-06 DIAGNOSIS — I2699 Other pulmonary embolism without acute cor pulmonale: Secondary | ICD-10-CM

## 2016-02-06 DIAGNOSIS — E038 Other specified hypothyroidism: Secondary | ICD-10-CM

## 2016-02-06 DIAGNOSIS — Z7901 Long term (current) use of anticoagulants: Secondary | ICD-10-CM | POA: Diagnosis not present

## 2016-02-06 DIAGNOSIS — K219 Gastro-esophageal reflux disease without esophagitis: Secondary | ICD-10-CM | POA: Diagnosis present

## 2016-02-06 DIAGNOSIS — G8929 Other chronic pain: Secondary | ICD-10-CM | POA: Diagnosis present

## 2016-02-06 DIAGNOSIS — K439 Ventral hernia without obstruction or gangrene: Secondary | ICD-10-CM | POA: Diagnosis present

## 2016-02-06 DIAGNOSIS — K449 Diaphragmatic hernia without obstruction or gangrene: Secondary | ICD-10-CM | POA: Diagnosis present

## 2016-02-06 DIAGNOSIS — G4733 Obstructive sleep apnea (adult) (pediatric): Secondary | ICD-10-CM | POA: Diagnosis present

## 2016-02-06 DIAGNOSIS — Z86718 Personal history of other venous thrombosis and embolism: Secondary | ICD-10-CM | POA: Diagnosis not present

## 2016-02-06 DIAGNOSIS — R109 Unspecified abdominal pain: Secondary | ICD-10-CM | POA: Diagnosis present

## 2016-02-06 DIAGNOSIS — K352 Acute appendicitis with generalized peritonitis: Secondary | ICD-10-CM | POA: Diagnosis not present

## 2016-02-06 LAB — CBC WITH DIFFERENTIAL/PLATELET
Basophils Absolute: 0 10*3/uL (ref 0.0–0.1)
Basophils Relative: 0 %
Eosinophils Absolute: 0.2 10*3/uL (ref 0.0–0.7)
Eosinophils Relative: 1 %
HEMATOCRIT: 38.3 % (ref 36.0–46.0)
Hemoglobin: 12.8 g/dL (ref 12.0–15.0)
LYMPHS PCT: 3 %
Lymphs Abs: 0.7 10*3/uL (ref 0.7–4.0)
MCH: 29 pg (ref 26.0–34.0)
MCHC: 33.4 g/dL (ref 30.0–36.0)
MCV: 86.7 fL (ref 78.0–100.0)
MONO ABS: 0.5 10*3/uL (ref 0.1–1.0)
MONOS PCT: 2 %
NEUTROS ABS: 21.9 10*3/uL — AB (ref 1.7–7.7)
Neutrophils Relative %: 94 %
Platelets: 380 10*3/uL (ref 150–400)
RBC: 4.42 MIL/uL (ref 3.87–5.11)
RDW: 14.2 % (ref 11.5–15.5)
WBC: 23.3 10*3/uL — ABNORMAL HIGH (ref 4.0–10.5)

## 2016-02-06 LAB — COMPREHENSIVE METABOLIC PANEL
ALBUMIN: 2.9 g/dL — AB (ref 3.5–5.0)
ALT: 36 U/L (ref 14–54)
AST: 27 U/L (ref 15–41)
Alkaline Phosphatase: 105 U/L (ref 38–126)
Anion gap: 14 (ref 5–15)
BILIRUBIN TOTAL: 0.5 mg/dL (ref 0.3–1.2)
BUN: 24 mg/dL — AB (ref 6–20)
CHLORIDE: 100 mmol/L — AB (ref 101–111)
CO2: 19 mmol/L — ABNORMAL LOW (ref 22–32)
Calcium: 9 mg/dL (ref 8.9–10.3)
Creatinine, Ser: 0.38 mg/dL — ABNORMAL LOW (ref 0.44–1.00)
GFR calc Af Amer: >60 mL/min (ref >60)
GFR calc non Af Amer: >60 mL/min (ref >60)
GLUCOSE: 110 mg/dL — AB (ref 65–99)
POTASSIUM: 4.1 mmol/L (ref 3.5–5.1)
Sodium: 133 mmol/L — ABNORMAL LOW (ref 135–145)
Total Protein: 7.6 g/dL (ref 6.5–8.1)

## 2016-02-06 LAB — I-STAT CG4 LACTIC ACID, ED
LACTIC ACID, VENOUS: 2.05 mmol/L — AB (ref 0.5–2.0)
Lactic Acid, Venous: 1.01 mmol/L (ref 0.5–2.0)

## 2016-02-06 LAB — URINALYSIS, ROUTINE W REFLEX MICROSCOPIC
Glucose, UA: NEGATIVE mg/dL
KETONES UR: 15 mg/dL — AB
LEUKOCYTES UA: NEGATIVE
NITRITE: NEGATIVE
PH: 5.5 (ref 5.0–8.0)
Protein, ur: 30 mg/dL — AB
SPECIFIC GRAVITY, URINE: 1.01 (ref 1.005–1.030)

## 2016-02-06 LAB — LACTIC ACID, PLASMA: LACTIC ACID, VENOUS: 1.7 mmol/L (ref 0.5–2.0)

## 2016-02-06 LAB — URINE MICROSCOPIC-ADD ON

## 2016-02-06 LAB — PROCALCITONIN: Procalcitonin: 1.88 ng/mL

## 2016-02-06 MED ORDER — DIPHENHYDRAMINE HCL 50 MG/ML IJ SOLN: 12.5000 mg | Freq: Four times a day (QID) | INTRAMUSCULAR | Status: DC | PRN

## 2016-02-06 MED ORDER — SODIUM CHLORIDE 0.9 % IV SOLN
Freq: Once | INTRAVENOUS | Status: AC
  Administered 2016-02-06: 17:00:00 via INTRAVENOUS

## 2016-02-06 MED ORDER — KCL IN DEXTROSE-NACL 20-5-0.45 MEQ/L-%-% IV SOLN
INTRAVENOUS | Status: DC
  Administered 2016-02-06 – 2016-02-09 (×6): via INTRAVENOUS
  Filled 2016-02-06 (×6): qty 1000

## 2016-02-06 MED ORDER — SODIUM CHLORIDE 0.9 % IV SOLN
500.0000 mg | Freq: Four times a day (QID) | INTRAVENOUS | Status: DC
  Filled 2016-02-06: qty 500

## 2016-02-06 MED ORDER — SODIUM CHLORIDE 0.9 % IV BOLUS (SEPSIS)
500.0000 mL | Freq: Once | INTRAVENOUS | Status: AC
  Administered 2016-02-06: 500 mL via INTRAVENOUS

## 2016-02-06 MED ORDER — CEFEPIME HCL 2 G IJ SOLR
2.0000 g | Freq: Once | INTRAMUSCULAR | Status: AC
  Administered 2016-02-06: 2 g via INTRAVENOUS
  Filled 2016-02-06: qty 2

## 2016-02-06 MED ORDER — METRONIDAZOLE IN NACL 5-0.79 MG/ML-% IV SOLN
500.0000 mg | Freq: Once | INTRAVENOUS | Status: AC
Start: 2016-02-06 — End: 2016-02-06
  Administered 2016-02-06: 500 mg via INTRAVENOUS
  Filled 2016-02-06: qty 100

## 2016-02-06 MED ORDER — DIPHENHYDRAMINE HCL 12.5 MG/5ML PO ELIX
12.5000 mg | ORAL_SOLUTION | Freq: Four times a day (QID) | ORAL | Status: DC | PRN
  Administered 2016-02-08 – 2016-02-12 (×2): 12.5 mg via ORAL
  Filled 2016-02-06: qty 10
  Filled 2016-02-06: qty 5

## 2016-02-06 MED ORDER — ONDANSETRON 4 MG PO TBDP
4.0000 mg | ORAL_TABLET | Freq: Four times a day (QID) | ORAL | Status: DC | PRN
  Filled 2016-02-06: qty 1

## 2016-02-06 MED ORDER — MORPHINE SULFATE (PF) 2 MG/ML IV SOLN
2.0000 mg | INTRAVENOUS | Status: DC | PRN
  Administered 2016-02-07: 2 mg via INTRAVENOUS
  Filled 2016-02-06: qty 1

## 2016-02-06 MED ORDER — ONDANSETRON HCL 4 MG/2ML IJ SOLN
4.0000 mg | Freq: Once | INTRAMUSCULAR | Status: AC
Start: 2016-02-06 — End: 2016-02-06
  Administered 2016-02-06: 4 mg via INTRAVENOUS
  Filled 2016-02-06: qty 2

## 2016-02-06 MED ORDER — ONDANSETRON HCL 4 MG/2ML IJ SOLN
4.0000 mg | Freq: Four times a day (QID) | INTRAMUSCULAR | Status: DC | PRN
  Administered 2016-02-13 – 2016-02-14 (×2): 4 mg via INTRAVENOUS
  Filled 2016-02-06 (×2): qty 2

## 2016-02-06 MED ORDER — PANTOPRAZOLE SODIUM 40 MG IV SOLR
40.0000 mg | Freq: Every day | INTRAVENOUS | Status: DC
  Administered 2016-02-06 – 2016-02-11 (×6): 40 mg via INTRAVENOUS
  Filled 2016-02-06 (×6): qty 40

## 2016-02-06 MED ORDER — IOPAMIDOL (ISOVUE-300) INJECTION 61%
INTRAVENOUS | Status: AC
  Administered 2016-02-06: 100 mL
  Filled 2016-02-06: qty 100

## 2016-02-06 MED ORDER — LEVOTHYROXINE SODIUM 50 MCG PO TABS
50.0000 ug | ORAL_TABLET | Freq: Every day | ORAL | Status: DC
  Administered 2016-02-07 – 2016-02-14 (×8): 50 ug via ORAL
  Filled 2016-02-06 (×8): qty 1

## 2016-02-06 NOTE — Consult Note (Signed)
Medical Consultation    Erin Vasquez [id number] DOB: December 10, 1955 DOA: 02/06/2016  PCP: Barrie Lyme, FNP   Consult requested by: Georganna Skeans, M.D.  Reason for consult: Medical management  Patient coming from: Home   Chief Complaint: Fevers, chills, abdominal pain   HPI: Erin Vasquez is a 60 y.o. 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 She remained in her usual state of health until approximately 10 days ago when she developed insidious onset of fevers, chills, and crampy lower abdominal pain. Symptoms had continued to worsen over the past 10 days, eventually becoming unbearable and prompting her presentation to the ED this evening.she has not taken Xarelto today and reports that the last dose was on 02/05/2016.  ED Course: Upon arrival to the ED, patient is found to be afebrile, saturating adequately on room air, tachycardic low 100s, and with vitals otherwise stable. Basic labs were drawn and CMP returns notable for a mild hyponatremia and hypochloremia, mildly suppressed bicarbonate,and markedly elevated BUN to creatinine ratio. CBC features a leukocytosis to [id number] with neutrophilic predominance. Initial lactic acid was 1.01, increasing to 2.05 on repeat. Chest x-ray was negative for acute cardiopulmonary disease and KUB featured mildly distended gas filled small bowel loops suggestive of a partial SBO or ileus. This was followed with CT of the abdomen and pelvis,, demonstrating extensive right lower quadrant inflammatory process and extraluminal fluid collection adjacent to the cecal  tip consistent with a perforated appendix. The patient was given a 500 cc normal saline bolus started on empiric cefepime and Flagyl, and surgery was ccontacted by the EDP. Patient was evaluated by surgery in the ED, admitted to the hospital, and medicine consultation was requested with tentative plans for operative management the afternoon of 02/07/2016.  On behalf of [facility], we sincerely appreciate the opportunity to be involved in the care of this fine lady.  Review of Systems:  All other systems reviewed and apart from HPI, are negative.  Past Medical History  Diagnosis Date  . Thyroid disease   . Hypothyroidism   . GERD (gastroesophageal reflux disease)     tx. Tums  . H/O hiatal hernia     hx. esophageal dilation x1  . Vitamin D deficiency   . Anemia     during pregnancy  . DVT (deep venous thrombosis) ([place]) 10/29/2015    LLE  . Bilateral pulmonary embolism ([place]) 10/29/2015  . Childhood asthma   . Sleep apnea     "CPAP ordered; insurance wouldn't cover" (10/29/2015)  . History of esophageal dilatation   . Arthritis     "all over"  . Chronic back pain   . Uterine cancer ([place])     uterine cancer  . Varicose veins     Past Surgical History  Procedure Laterality Date  . Bunionectomy with hammertoe reconstruction Left ~ 2005  . Tonsillectomy    . Esophagogastroduodenoscopy (egd) with propofol N/A 08/02/2013    Procedure: ESOPHAGOGASTRODUODENOSCOPY (EGD) WITH PROPOFOL;  Surgeon: Arta Silence, MD;  Location: [facility];  Service: Endoscopy;  Laterality: N/A;  . Balloon dilation N/A 08/02/2013    Procedure: BALLOON DILATION;  Surgeon: Gwyndolyn Saxon  Paulita Fujita, MD;  Location: [facility];  Service: Endoscopy;  Laterality: N/A;  . Colonoscopy with propofol N/A 08/02/2013    Procedure: COLONOSCOPY WITH PROPOFOL;  Surgeon: Arta Silence, MD;  Location: [facility];  Service: Endoscopy;  Laterality: N/A;  . Shoulder arthroscopy with debridement and bicep tendon repair Left  09/11/2014    Procedure: SHOULDER ARTHROSCOPY WITH DEBRIDEMENT AND BICEP TENDON REPAIR;  Surgeon: Renette Butters, MD;  Location: [place];  Service: Orthopedics;  Laterality: Left;  . Shoulder arthroscopy with distal clavicle resection Left 09/11/2014    Procedure: SHOULDER ARTHROSCOPY WITH DISTAL CLAVICLE RESECTION;  Surgeon: Renette Butters, MD;  Location: [place];  Service: Orthopedics;  Laterality: Left;  . Shoulder arthroscopy with subacromial decompression Left 09/11/2014    Procedure: SHOULDER ARTHROSCOPY WITH SUBACROMIAL DECOMPRESSION;  Surgeon: Renette Butters, MD;  Location: [place];  Service: Orthopedics;  Laterality: Left;  . Abdominal hysterectomy  01/23/2014    robotic surgery for uterine cancer  . Dilation and curettage of uterus  X 2    S/P miscarriage  . Esophagogastroduodenoscopy (egd) with esophageal dilation  ~ 2003     reports that she has never smoked. She has quit using smokeless tobacco. Her smokeless tobacco use included Chew. She reports that she drinks alcohol. She reports that she does not use illicit drugs.  Allergies  Allergen Reactions  . Bee Venom Swelling and Other (See Comments)    Patient is allergic to bee venom, mosquitos, wasps, yellow jackets and bees. Wherever she gets stung that area swells, turns hot and has fevers.    . Other Anaphylaxis and Shortness Of Breath    Hickory smoke  . Food Other (See Comments)    Tangerines. Unknown childhood allergy.   . Penicillins Other (See Comments)    UNKNOWN: CHILDHOOD ALLERGY     Family History  Problem Relation Age of Onset  . Transient ischemic attack Mother   . Heart attack Father   . Diabetes type II Other   . Lung cancer Maternal Grandmother      Prior to Admission medications   Medication Sig Start Date End Date Taking? Authorizing Provider  Cholecalciferol (VITAMIN D3) 5000 units TABS Take 1 tablet by mouth daily. 12/03/15  Yes Historical Provider, MD  clobetasol cream (TEMOVATE) [id number] % Apply 1  application topically as directed. 12/03/15  Yes Historical Provider, MD  levothyroxine (SYNTHROID, LEVOTHROID) 50 MCG tablet Take 50 mcg by mouth daily before breakfast.    Yes Historical Provider, MD  meloxicam (MOBIC) 15 MG tablet Take 15 mg by mouth daily.   Yes Historical Provider, MD  ranitidine (ZANTAC) 150 MG tablet Take 150 mg by mouth 2 (two) times daily.   Yes Historical Provider, MD  Rivaroxaban (XARELTO) 15 MG TABS tablet Take 1 tablet (15 mg total) by mouth 2 (two) times daily with a meal. 10/30/15 02/06/16 Yes Carlyle Dolly, MD    Physical Exam: Filed Vitals:   02/06/16 1830 02/06/16 1900 02/06/16 1918 02/06/16 1930  BP: 130/72 135/75  124/70  Pulse: 102 108  106  Temp:   98.8 F (37.1 C)   TempSrc:   Oral   Resp: 22 21  21   Weight:      SpO2: 94% 93%  93%      Constitutional: no apparent respiratory distress, in apparent discomfort Eyes: PERTLA, lids and conjunctivae normal ENMT: Mucous membranes are dry. Posterior pharynx clear of any exudate or lesions.   Neck: normal, supple, no masses, no thyromegaly Respiratory:  clear to auscultation bilaterally, no wheezing, no crackles. Normal respiratory effort.   Cardiovascular: Rate ~110 and regular, no significant murmurs / rubs / gallops. No carotid bruits. No significant JVD. Abdomen: No distension, tender throughout but mostly in lower quadrants, no masses palpated. No rebound pain or guarding.   Musculoskeletal: no clubbing / cyanosis. No joint deformity upper and lower extremities. Normal muscle tone.  Skin: no significant rashes, lesions, ulcers. Warm, dry, well-perfused. Neurologic: CN 2-12 grossly intact. Sensation intact, DTR normal. Strength 5/5 in all 4 limbs.  Psychiatric: Normal judgment and insight. Alert and oriented x 3. Normal mood and affect.     Labs on Admission: I have personally reviewed following labs and imaging studies  CBC:  Recent Labs Lab 02/06/16 1223  WBC 23.3*  NEUTROABS 21.9*    HGB 12.8  HCT 38.3  MCV 86.7  PLT 123XX123   Basic Metabolic Panel:  Recent Labs Lab 02/06/16 1223  NA 133*  K 4.1  CL 100*  CO2 19*  GLUCOSE 110*  BUN 24*  CREATININE 0.38*  CALCIUM 9.0   GFR: Estimated Creatinine Clearance: 106.7 mL/min (by C-G formula based on Cr of 0.38). Liver Function Tests:  Recent Labs Lab 02/06/16 1223  AST 27  ALT 36  ALKPHOS 105  BILITOT 0.5  PROT 7.6  ALBUMIN 2.9*   No results for input(s): LIPASE, AMYLASE in the last 168 hours. No results for input(s): AMMONIA in the last 168 hours. Coagulation Profile: No results for input(s): INR, PROTIME in the last 168 hours. Cardiac Enzymes: No results for input(s): CKTOTAL, CKMB, CKMBINDEX, TROPONINI in the last 168 hours. BNP (last 3 results) No results for input(s): PROBNP in the last 8760 hours. HbA1C: No results for input(s): HGBA1C in the last 72 hours. CBG: No results for input(s): GLUCAP in the last 168 hours. Lipid Profile: No results for input(s): CHOL, HDL, LDLCALC, TRIG, CHOLHDL, LDLDIRECT in the last 72 hours. Thyroid Function Tests: No results for input(s): TSH, T4TOTAL, FREET4, T3FREE, THYROIDAB in the last 72 hours. Anemia Panel: No results for input(s): VITAMINB12, FOLATE, FERRITIN, TIBC, IRON, RETICCTPCT in the last 72 hours. Urine analysis:    Component Value Date/Time   COLORURINE AMBER* 02/06/2016 1650   APPEARANCEUR HAZY* 02/06/2016 1650   LABSPEC 1.010 02/06/2016 1650   PHURINE 5.5 02/06/2016 1650   GLUCOSEU NEGATIVE 02/06/2016 1650   HGBUR LARGE* 02/06/2016 1650   BILIRUBINUR MODERATE* 02/06/2016 1650   KETONESUR 15* 02/06/2016 1650   PROTEINUR 30* 02/06/2016 1650   NITRITE NEGATIVE 02/06/2016 1650   LEUKOCYTESUR NEGATIVE 02/06/2016 1650   Sepsis Labs: @LABRCNTIP (procalcitonin:4,lacticidven:4) )No results found for this or any previous visit (from the past 240 hour(s)).   Radiological Exams on Admission: Ct Abdomen Pelvis W Contrast  02/06/2016  CLINICAL  DATA:  Left lower quadrant abdominal pain for 10 days with some constipation, diarrhea and vomiting. Leukocytosis. Initial encounter. EXAM: CT ABDOMEN AND PELVIS WITH CONTRAST TECHNIQUE: Multidetector CT imaging of the abdomen and pelvis was performed using the standard protocol following bolus administration of intravenous contrast. CONTRAST:  163mL ISOVUE-300 IOPAMIDOL (ISOVUE-300) INJECTION 61% COMPARISON:  Limited correlation made with chest CT 10/29/2015. Acute abdominal series done today. FINDINGS: Lower chest: There is a calcified right lower lobe granuloma and calcified right hilar lymph nodes. The lungs are otherwise clear. There is a small to moderate hiatal hernia and mild cardiomegaly. Hepatobiliary: The liver is normal in density without focal abnormality. No evidence of gallstones, gallbladder wall thickening or biliary dilatation. Pancreas: Unremarkable. No  pancreatic ductal dilatation or surrounding inflammatory changes. Spleen: Normal in size without focal abnormality. Adrenals/Urinary Tract: Both adrenal glands appear normal. Both kidneys appear normal. There is no evidence of urinary tract calculus or hydronephrosis. The bladder is incompletely distended, although demonstrates probable mild wall thickening. Stomach/Bowel: As above, there is a small to moderate hiatal hernia. The stomach and small bowel demonstrate no significant findings. There is a small amount of fluid throughout the small bowel which is not significantly dilated. There is a right lower quadrant inflammatory process inferior to the cecum within extraluminal fluid collection measuring 4.5 x 2.2 cm on image 81. The appendix is not clearly visualized. There is a calcification on image 71 which could reflect an at appendicolith. There are inflammatory changes throughout the right lower quadrant. The colon is decompressed. There is no focal colonic wall thickening or pericolonic inflammation. Vascular/Lymphatic: There are no enlarged  abdominal or pelvic lymph nodes. Mild aortic and branch vessel atherosclerosis. Reproductive: Hysterectomy. No evidence of adnexal mass. There is some free pelvic fluid. Other: Tiny umbilical hernia containing only fat. 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 Musculoskeletal: No acute or significant osseous findings. Mild lumbar spondylosis. IMPRESSION: 1. Extensive right lower quadrant inflammatory process with diffuse inflammatory changes and an extraluminal fluid collection adjacent to the cecal tip, most consistent with perforated appendicitis. 2. No evidence of diverticulitis or bowel obstruction. 3. Small abdominal wall hernias containing only fat. 4. These results were called by telephone at the time of interpretation on 02/06/2016 at 4:22 pm to Dr. Lonia Skinner, who verbally acknowledged these results. Electronically Signed   By: Richardean Sale M.D.   On: 02/06/2016 16:23   Dg Abd Acute W/chest  02/06/2016  CLINICAL DATA:  Shakes, fever, chills, decreased appetite, abdominal cramps, nausea and diarrhea. Symptoms onset 2 days ago. EXAM: DG ABDOMEN ACUTE W/ 1V CHEST COMPARISON:  Chest x-ray dated 09/29/2005. FINDINGS: Single view of the chest: Heart size is normal. Overall cardiomediastinal silhouette is stable in size and configuration. Benign calcified granuloma noted at the right lung base. Lungs otherwise clear. No evidence of pneumonia. No pleural effusion or pneumothorax seen. Osseous structures about the chest are unremarkable. Supine and upright views of the abdomen: Mildly distended gas-filled loops of small bowel within the central abdomen and left lower quadrant, with associated air-fluid levels. No evidence of soft tissue mass or free intraperitoneal air. Degenerative changes noted within the thoracolumbar spine. No acute - appearing osseous abnormality. IMPRESSION: 1. Mildly distended gas-filled loops of small bowel within the central abdomen and left lower quadrant,  with associated air-fluid levels. Findings suggest either partial small bowel obstruction or ileus. CT abdomen/pelvis may be helpful for further characterization. 2. Heart size is normal. No evidence of acute cardiopulmonary abnormality. Electronically Signed   By: Franki Cabot M.D.   On: 02/06/2016 14:09    EKG: Ordered and pending.   Assessment/Plan  1. Ruptured appendicitis - Meets sepsis criteria on admission - Received empiric cefepime and Flagyl in ED prior to blood cxs being drawn  - Empiric abx switched to Primaxin by primary team  - 500 cc NS bolus given in ED, will repeat bolus given persistent sinus tachycardia, increasing lactate, and dehydration with BUN:SCr ratio >60  - Trend lactate, procalcitonin - Tentative plans for surgery the afternoon of 02/07/16  - Hold Xarelto, last dose 02/05/16 - NPO, pain-control and antiemetics prn   2. DVT, b/l PE  - Diagnosed in February 2017; treated with Lovenox and  transitioned to Xarelto  - Evaluated by hematology with thrombophilia work-up notable for lupus anticoagulant on initial testing - Has tolerated Xarelto well thus far, currently held for planned surgery, last dose 02/05/16; can likely be resumed when hemostasis achived  - Discussed with pt the increased risk of DVT/PE propagation/recurrence off of Xarelto; benefit felt to outweigh risk   3. Hyponatremia, dehydration - Serum sodium 133 on admission in setting of dehydration  - [id number] ratio >60, mucous membranes dry, sinus tachycardia  - 500 cc NS bolus given in ED, repeating bolus on admission d/t persistent tachycardia  - Follow I/Os, repeat chem panel in am    4. Hypothyroidism  - Appears stable  - Continue current-dose Synthroid   5. GERD - Stable - EGD (November 2014) with Schatzki ring (dilated), but no Barrett's or esophagitis - Managed with Zantac 150 mg BID at home - Continue acid-suppression with Protonix while in hospital    6. OSA  - CPAP qHS      DVT prophylaxis: SCD contraindicated by DVT; sq heparin recommended   Code Status: Full   Family Communication: Mother at the bedside   Disposition Plan: Admitted to surgical service Admission status: Inpatient   Vianne Bulls, MD [facility] Pager [phone]  If 7PM-7AM, please contact night-coverage [email or web address] Password [facility]  02/06/2016, 7:53 PM

## 2016-02-06 NOTE — ED Notes (Signed)
Patient assisted to bathroom via wheelchair by EMT at bedside.

## 2016-02-06 NOTE — ED Notes (Signed)
Attempted report to floor RN

## 2016-02-06 NOTE — Progress Notes (Signed)
Pharmacy Antibiotic Note  Erin Vasquez is a 60 y.o. female admitted on 02/06/2016 with intra-abdominal infection. Pharmacy has been consulted for imipenem dosing. Was given a dose of cefepime and Flagyl in the ED.   Day #1 of abx for intra-abdominal infection. Afebrile, WBC elevated at 23.3. SCr low at 0.38, normalized CrCl ~107ml/min.  Plan: Start imipenem 500mg  IV Q6 Monitor clinical picture, renal function F/U C&S, abx deescalation / LOT   Weight: 287 lb (130.182 kg)  Temp (24hrs), Avg:98.5 F (36.9 C), Min:98.2 F (36.8 C), Max:98.8 F (37.1 C)   Recent Labs Lab 02/06/16 1223 02/06/16 1234 02/06/16 1711 02/06/16 2048  WBC 23.3*  --   --   --   CREATININE 0.38*  --   --   --   LATICACIDVEN  --  1.01 2.05* 1.7    Estimated Creatinine Clearance: 106.7 mL/min (by C-G formula based on Cr of 0.38).    Allergies  Allergen Reactions  . Bee Venom Swelling and Other (See Comments)    Patient is allergic to bee venom, mosquitos, wasps, yellow jackets and bees. Wherever she gets stung that area swells, turns hot and has fevers.    . Other Anaphylaxis and Shortness Of Breath    Hickory smoke  . Food Other (See Comments)    Tangerines. Unknown childhood allergy.   . Penicillins Other (See Comments)    UNKNOWN: CHILDHOOD ALLERGY     Antimicrobials this admission: Cefepime 5/25 >> 5/25 Flagyl 5/25 >> 5/25 Imipenem 5/25 >>  Dose adjustments this admission: n/a  Microbiology results: n/a  Thank you for allowing pharmacy to be a part of this patient's care.  Reginia Naas 02/06/2016 9:57 PM

## 2016-02-06 NOTE — ED Provider Notes (Addendum)
CSN: [id number]     Arrival date & time 02/06/16  1206 History   First MD Initiated Contact with Patient 02/06/16 1245     Chief Complaint  Patient presents with  . Chills      HPI Patient presents with multiple complaints. States the last 10 days she's felt bad. She's had nausea vomiting diarrhea abdominal pain she states she threw up once. States she also felt something tear loose in  her abdomen.She is on blood thinners for DVTs and pulmonary embolisms. States she's felt bad. SomeDecreased appetite and chills also. Pain is dull and constant. Has been going for the last 10 days with various points in various stages. She has kept track on a list.       Past Medical History  Diagnosis Date  . Thyroid disease   . Hypothyroidism   . GERD (gastroesophageal reflux disease)     tx. Tums  . H/O hiatal hernia     hx. esophageal dilation x1  . Vitamin D deficiency   . Anemia     during pregnancy  . DVT (deep venous thrombosis) ([place]) 10/29/2015    LLE  . Bilateral pulmonary embolism ([place]) 10/29/2015  . Childhood asthma   . Sleep apnea     "CPAP ordered; insurance wouldn't cover" (10/29/2015)  . History of esophageal dilatation   . Arthritis     "all over"  . Chronic back pain   . Uterine cancer ([place])     uterine cancer  . Varicose veins    Past Surgical History  Procedure Laterality Date  . Bunionectomy with hammertoe reconstruction Left ~ 2005  . Tonsillectomy    . Esophagogastroduodenoscopy (egd) with propofol N/A 08/02/2013    Procedure: ESOPHAGOGASTRODUODENOSCOPY (EGD) WITH PROPOFOL;  Surgeon: Arta Silence, MD;  Location: [facility];  Service: Endoscopy;  Laterality: N/A;  . Balloon dilation N/A 08/02/2013    Procedure: BALLOON DILATION;  Surgeon: Arta Silence, MD;  Location: [facility];  Service: Endoscopy;  Laterality: N/A;  . Colonoscopy with propofol N/A 08/02/2013    Procedure: COLONOSCOPY WITH PROPOFOL;  Surgeon: Arta Silence, MD;  Location: [facility];   Service: Endoscopy;  Laterality: N/A;  . Shoulder arthroscopy with debridement and bicep tendon repair Left 09/11/2014    Procedure: SHOULDER ARTHROSCOPY WITH DEBRIDEMENT AND BICEP TENDON REPAIR;  Surgeon: Renette Butters, MD;  Location: [place];  Service: Orthopedics;  Laterality: Left;  . Shoulder arthroscopy with distal clavicle resection Left 09/11/2014    Procedure: SHOULDER ARTHROSCOPY WITH DISTAL CLAVICLE RESECTION;  Surgeon: Renette Butters, MD;  Location: [place];  Service: Orthopedics;  Laterality: Left;  . Shoulder arthroscopy with subacromial decompression Left 09/11/2014    Procedure: SHOULDER ARTHROSCOPY WITH SUBACROMIAL DECOMPRESSION;  Surgeon: Renette Butters, MD;  Location: [place];  Service: Orthopedics;  Laterality: Left;  . Abdominal hysterectomy  01/23/2014    robotic surgery for uterine cancer  . Dilation and curettage of uterus  X 2    S/P miscarriage  . Esophagogastroduodenoscopy (egd) with esophageal dilation  ~ 2003   Family History  Problem Relation Age of Onset  . Transient ischemic attack Mother   . Heart attack Father   . Diabetes type II Other   . Lung cancer Maternal Grandmother    Social History  Substance Use Topics  . Smoking status: Never Smoker   . Smokeless tobacco: Former Systems developer    Types: Chew     Comment: "chewed as a teen  . Alcohol Use: Yes  Comment: "quit drinking by age 39; social drinker only"   OB History    No data available     Review of Systems  Constitutional: Positive for fever, appetite change and fatigue.  Respiratory: Positive for shortness of breath.   Gastrointestinal: Positive for nausea, vomiting, abdominal pain, diarrhea and constipation.  Genitourinary: Positive for flank pain. Negative for urgency and genital sores.  Musculoskeletal: Positive for myalgias and back pain.  Neurological: Positive for dizziness, weakness, light-headedness and headaches.      Allergies  Bee venom; Other; Food; and Penicillins  Home  Medications   Prior to Admission medications   Medication Sig Start Date End Date Taking? Authorizing Provider  Cholecalciferol (VITAMIN D3) 5000 units TABS Take 1 tablet by mouth daily. 12/03/15  Yes Historical Provider, MD  clobetasol cream (TEMOVATE) [id number] % Apply 1 application topically as directed. 12/03/15  Yes Historical Provider, MD  levothyroxine (SYNTHROID, LEVOTHROID) 50 MCG tablet Take 50 mcg by mouth daily before breakfast.    Yes Historical Provider, MD  meloxicam (MOBIC) 15 MG tablet Take 15 mg by mouth daily.   Yes Historical Provider, MD  ranitidine (ZANTAC) 150 MG tablet Take 150 mg by mouth 2 (two) times daily.   Yes Historical Provider, MD  Rivaroxaban (XARELTO) 15 MG TABS tablet Take 1 tablet (15 mg total) by mouth 2 (two) times daily with a meal. 10/30/15 02/06/16 Yes Carlyle Dolly, MD   BP 134/90 mmHg  Pulse 94  Temp(Src) 98.2 F (36.8 C) (Oral)  Resp 19  SpO2 99% Physical Exam  Constitutional: She appears well-developed.  HENT:  Head: Atraumatic.  Neck: Neck supple.  Cardiovascular: Normal rate.   Pulmonary/Chest: Effort normal.  Abdominal: There is tenderness.  Tenderness and upper abdomen without mass.  Neurological: She is alert.  Skin: Skin is warm.    ED Course  Procedures (including critical care time) Labs Review Labs Reviewed  COMPREHENSIVE METABOLIC PANEL - Abnormal; Notable for the following:    Sodium 133 (*)    Chloride 100 (*)    CO2 19 (*)    Glucose, Bld 110 (*)    BUN 24 (*)    Creatinine, Ser 0.38 (*)    Albumin 2.9 (*)    All other components within normal limits  CBC WITH DIFFERENTIAL/PLATELET - Abnormal; Notable for the following:    WBC 23.3 (*)    Neutro Abs 21.9 (*)    All other components within normal limits  URINALYSIS, ROUTINE W REFLEX MICROSCOPIC (NOT AT [facility])  I-STAT CG4 LACTIC ACID, ED  I-STAT CG4 LACTIC ACID, ED    Imaging Review Dg Abd Acute W/chest  02/06/2016  CLINICAL DATA:  Shakes, fever, chills,  decreased appetite, abdominal cramps, nausea and diarrhea. Symptoms onset 2 days ago. EXAM: DG ABDOMEN ACUTE W/ 1V CHEST COMPARISON:  Chest x-ray dated 09/29/2005. FINDINGS: Single view of the chest: Heart size is normal. Overall cardiomediastinal silhouette is stable in size and configuration. Benign calcified granuloma noted at the right lung base. Lungs otherwise clear. No evidence of pneumonia. No pleural effusion or pneumothorax seen. Osseous structures about the chest are unremarkable. Supine and upright views of the abdomen: Mildly distended gas-filled loops of small bowel within the central abdomen and left lower quadrant, with associated air-fluid levels. No evidence of soft tissue mass or free intraperitoneal air. Degenerative changes noted within the thoracolumbar spine. No acute - appearing osseous abnormality. IMPRESSION: 1. Mildly distended gas-filled loops of small bowel within the central abdomen and left lower  quadrant, with associated air-fluid levels. Findings suggest either partial small bowel obstruction or ileus. CT abdomen/pelvis may be helpful for further characterization. 2. Heart size is normal. No evidence of acute cardiopulmonary abnormality. Electronically Signed   By: Franki Cabot M.D.   On: 02/06/2016 14:09   I have personally reviewed and evaluated these images and lab results as part of my medical decision-making.   EKG Interpretation None      MDM   Final diagnoses:  None    Patient with abdominal pain multiple complaints. Reported fevers. White count is elevated and x-ray shows possible bowel obstruction. CT scan pending at this time.    Davonna Belling, MD 02/06/16 1453  Davonna Belling, MD 02/06/16 [phone]

## 2016-02-06 NOTE — ED Provider Notes (Signed)
Patient was accepted in sign out pending CT abdomen and pelvis. I was called by the radiologist as there is concern for perforated appendicitis on her CT. On my examination the patient continues to be most tender in the left lower quadrant. Patient was significantly elevated white blood cell count and mild tachycardia. IV fluids, Zofran, Zosyn were ordered. Gen. surgery was consulted. Gen surg said somebody would be cut down to see and evaluate the patient at bedside. Patient was updated on all results and plan of care at this time.  Patient was seen at bedside by Dr. Grandville Silos and admitted under the care of general surgery.  Harvel Quale, MD 02/06/16 [phone]

## 2016-02-06 NOTE — ED Notes (Signed)
She c/o 10 day history of shakes, fevers, chills, decreased appetite, abd cramps, nausea, diarrhea. She is currently on xarelto for blood clots in legs and lungs. She is alert and breathing easily now

## 2016-02-06 NOTE — ED Notes (Signed)
Pt told to let staff know if she was able to void for urine analysis

## 2016-02-06 NOTE — H&P (Addendum)
Erin Vasquez is an 60 y.o. female.   Chief Complaint: Lower abdominal pain HPI: Erin Vasquez presented to the emergency department complaining of lower abdominal pain and fever. This is been going on for 10 days. She has had associated nausea and vomiting. She claims she has lost 17 pounds. She tried to go to her primary care physician yesterday but was directed to urgent care. Once she was seen there she was advised to come to the emergency department. Additionally, she was admitted in February of this year with left lower extremity DVT and bilateral PE. She is currently on Xarelto. She has been having some ongoing shortness of breath at home. Last month, she felt she strained her abdominal wall pulling weeds. That led to some pain in the left lower quadrant. Workup in the emergency department today includes lab work demonstrating leukocytosis of 23,300. 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 I was asked to see her for management.  Past Medical History  Diagnosis Date  . Thyroid disease   . Hypothyroidism   . GERD (gastroesophageal reflux disease)     tx. Tums  . H/O hiatal hernia     hx. esophageal dilation x1  . Vitamin D deficiency   . Anemia     during pregnancy  . DVT (deep venous thrombosis) (Erin Vasquez) 10/29/2015    LLE  . Bilateral pulmonary embolism ([place]) 10/29/2015  . Childhood asthma   . Sleep apnea     "CPAP ordered; insurance wouldn't cover" (10/29/2015)  . History of esophageal dilatation   . Arthritis     "all over"  . Chronic back pain   . Uterine cancer (Erin Vasquez)     uterine cancer  . Varicose veins     Past Surgical History  Procedure Laterality Date  . Bunionectomy with hammertoe reconstruction Left ~ 2005  . Tonsillectomy    . Esophagogastroduodenoscopy (egd) with propofol N/A 08/02/2013    Procedure: ESOPHAGOGASTRODUODENOSCOPY (EGD) WITH PROPOFOL;  Surgeon: Arta Silence, MD;  [facility];  Service: Endoscopy;  Laterality: N/A;  . Balloon dilation N/A 08/02/2013    Procedure: BALLOON DILATION;  Surgeon: Arta Silence, MD;  [facility];  Service: Endoscopy;  Laterality: N/A;  . Colonoscopy with propofol N/A 08/02/2013    Procedure: COLONOSCOPY WITH PROPOFOL;  Surgeon: Arta Silence, MD;  [facility];  Service: Endoscopy;  Laterality: N/A;  . Shoulder arthroscopy with debridement and bicep tendon repair Left 09/11/2014    Procedure: SHOULDER ARTHROSCOPY WITH DEBRIDEMENT AND BICEP TENDON REPAIR;  Surgeon: Renette Butters, MD;  Location: [place];  Service: Orthopedics;  Laterality: Left;  . Shoulder arthroscopy with distal clavicle resection Left 09/11/2014    Procedure: SHOULDER ARTHROSCOPY WITH DISTAL CLAVICLE RESECTION;  Surgeon: Renette Butters, MD;  Location: [place];  Service: Orthopedics;  Laterality: Left;  . Shoulder arthroscopy with subacromial decompression Left 09/11/2014    Procedure: SHOULDER ARTHROSCOPY WITH SUBACROMIAL DECOMPRESSION;  Surgeon: Renette Butters, MD;  Location: [place];  Service: Orthopedics;  Laterality: Left;  . Abdominal hysterectomy  01/23/2014    robotic surgery for uterine cancer  . Dilation and curettage of uterus  X 2    S/P miscarriage  . Esophagogastroduodenoscopy (egd) with esophageal dilation  ~ 2003    Family History  Problem Relation Age of Onset  . Transient ischemic attack Mother   . Heart attack Father   . Diabetes  type II Other   . Lung cancer Maternal Grandmother    Social History:  reports that she has never smoked. She has quit using smokeless tobacco. Her smokeless tobacco use included Chew. She reports that she drinks alcohol. She reports that she does not use illicit drugs.  Allergies:  Allergies  Allergen Reactions  . Bee Venom Swelling and Other (See Comments)    Patient is allergic to bee venom, mosquitos, wasps, yellow jackets and bees. Wherever she gets stung  that area swells, turns hot and has fevers.    . Other Anaphylaxis and Shortness Of Breath    Hickory smoke  . Food Other (See Comments)    Tangerines. Unknown childhood allergy.   . Penicillins Other (See Comments)    UNKNOWN: CHILDHOOD ALLERGY      (Not in a hospital admission)  Results for orders placed or performed during the hospital encounter of 02/06/16 (from the past 48 hour(s))  Comprehensive metabolic panel     Status: Abnormal   Collection Time: 02/06/16 12:23 PM  Result Value Ref Range   Sodium 133 (L) 135 - 145 mmol/L   Potassium 4.1 3.5 - 5.1 mmol/L   Chloride 100 (L) 101 - 111 mmol/L   CO2 19 (L) 22 - 32 mmol/L   Glucose, Bld 110 (H) 65 - 99 mg/dL   BUN 24 (H) 6 - 20 mg/dL   Creatinine, Ser 0.38 (L) 0.44 - 1.00 mg/dL   Calcium 9.0 8.9 - 10.3 mg/dL   Total Protein 7.6 6.5 - 8.1 g/dL   Albumin 2.9 (L) 3.5 - 5.0 g/dL   AST 27 15 - 41 U/L   ALT 36 14 - 54 U/L   Alkaline Phosphatase 105 38 - 126 U/L   Total Bilirubin 0.5 0.3 - 1.2 mg/dL   GFR calc non Af Amer >60 >60 mL/min   GFR calc Af Amer >60 >60 mL/min    Comment: (NOTE) The eGFR has been calculated using the CKD EPI equation. This calculation has not been validated in all clinical situations. eGFR's persistently <60 mL/min signify possible Chronic Kidney Disease.    Anion gap 14 5 - 15  CBC with Differential     Status: Abnormal   Collection Time: 02/06/16 12:23 PM  Result Value Ref Range   WBC 23.3 (H) 4.0 - 10.5 K/uL   RBC 4.42 3.87 - 5.11 MIL/uL   Hemoglobin 12.8 12.0 - 15.0 g/dL   HCT 38.3 36.0 - 46.0 %   MCV 86.7 78.0 - 100.0 fL   MCH 29.0 26.0 - 34.0 pg   MCHC 33.4 30.0 - 36.0 g/dL   RDW 14.2 11.5 - 15.5 %   Platelets 380 150 - 400 K/uL   Neutrophils Relative % 94 %   Neutro Abs 21.9 (H) 1.7 - 7.7 K/uL   Lymphocytes Relative 3 %   Lymphs Abs 0.7 0.7 - 4.0 K/uL   Monocytes Relative 2 %   Monocytes Absolute 0.5 0.1 - 1.0 K/uL   Eosinophils Relative 1 %   Eosinophils Absolute 0.2 0.0 - 0.7  K/uL   Basophils Relative 0 %   Basophils Absolute 0.0 0.0 - 0.1 K/uL  I-Stat CG4 Lactic Acid, ED     Status: None   Collection Time: 02/06/16 12:34 PM  Result Value Ref Range   Lactic Acid, Venous 1.01 0.5 - 2.0 mmol/L  Urinalysis, Routine w reflex microscopic (not at [facility])     Status: Abnormal   Collection Time: 02/06/16  4:50 PM  Result Value Ref Range   Color, Urine AMBER (A) YELLOW    Comment: BIOCHEMICALS MAY BE AFFECTED BY COLOR   APPearance HAZY (A) CLEAR   Specific Gravity, Urine 1.010 1.005 - 1.030   pH 5.5 5.0 - 8.0   Glucose, UA NEGATIVE NEGATIVE mg/dL   Hgb urine dipstick LARGE (A) NEGATIVE   Bilirubin Urine MODERATE (A) NEGATIVE   Ketones, ur 15 (A) NEGATIVE mg/dL   Protein, ur 30 (A) NEGATIVE mg/dL   Nitrite NEGATIVE NEGATIVE   Leukocytes, UA NEGATIVE NEGATIVE  Urine microscopic-add on     Status: Abnormal   Collection Time: 02/06/16  4:50 PM  Result Value Ref Range   Squamous Epithelial / LPF 0-5 (A) NONE SEEN   WBC, UA 0-5 0 - 5 WBC/hpf   RBC / HPF 6-30 0 - 5 RBC/hpf   Bacteria, UA FEW (A) NONE SEEN   Casts GRANULAR CAST (A) NEGATIVE    Comment: HYALINE CASTS   Urine-Other MUCOUS PRESENT   I-Stat CG4 Lactic Acid, ED     Status: Abnormal   Collection Time: 02/06/16  5:11 PM  Result Value Ref Range   Lactic Acid, Venous 2.05 (HH) 0.5 - 2.0 mmol/L   Comment NOTIFIED PHYSICIAN    Ct Abdomen Pelvis W Contrast  02/06/2016  CLINICAL DATA:  Left lower quadrant abdominal pain for 10 days with some constipation, diarrhea and vomiting. Leukocytosis. Initial encounter. EXAM: CT ABDOMEN AND PELVIS WITH CONTRAST TECHNIQUE: Multidetector CT imaging of the abdomen and pelvis was performed using the standard protocol following bolus administration of intravenous contrast. CONTRAST:  175m ISOVUE-300 IOPAMIDOL (ISOVUE-300) INJECTION 61% COMPARISON:  Limited correlation made with chest CT 10/29/2015. Acute abdominal series done today. FINDINGS: Lower chest: There is a calcified  right lower lobe granuloma and calcified right hilar lymph nodes. The lungs are otherwise clear. There is a small to moderate hiatal hernia and mild cardiomegaly. Hepatobiliary: The liver is normal in density without focal abnormality. No evidence of gallstones, gallbladder wall thickening or biliary dilatation. Pancreas: Unremarkable. No pancreatic ductal dilatation or surrounding inflammatory changes. Spleen: Normal in size without focal abnormality. Adrenals/Urinary Tract: Both adrenal glands appear normal. Both kidneys appear normal. There is no evidence of urinary tract calculus or hydronephrosis. The bladder is incompletely distended, although demonstrates probable mild wall thickening. Stomach/Bowel: As above, there is a small to moderate hiatal hernia. The stomach and small bowel demonstrate no significant findings. There is a small amount of fluid throughout the small bowel which is not significantly dilated. There is a right lower quadrant inflammatory process inferior to the cecum within extraluminal fluid collection measuring 4.5 x 2.2 cm on image 81. The appendix is not clearly visualized. There is a calcification on image 71 which could reflect an at appendicolith. There are inflammatory changes throughout the right lower quadrant. The colon is decompressed. There is no focal colonic wall thickening or pericolonic inflammation. Vascular/Lymphatic: There are no enlarged abdominal or pelvic lymph nodes. Mild aortic and branch vessel atherosclerosis. Reproductive: Hysterectomy. No evidence of adnexal mass. There is some free pelvic fluid. Other: Tiny umbilical hernia containing only fat. 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 Musculoskeletal: No acute or significant osseous findings. Mild lumbar spondylosis. IMPRESSION: 1. Extensive right lower quadrant inflammatory process with diffuse inflammatory changes and an extraluminal fluid collection adjacent to the cecal tip, most  consistent with perforated appendicitis. 2. No evidence of diverticulitis or bowel obstruction. 3. Small abdominal wall hernias containing only fat. 4. These  results were called by telephone at the time of interpretation on 02/06/2016 at 4:22 pm to Dr. Lonia Skinner, who verbally acknowledged these results. Electronically Signed   By: Richardean Sale M.D.   On: 02/06/2016 16:23   Dg Abd Acute W/chest  02/06/2016  CLINICAL DATA:  Shakes, fever, chills, decreased appetite, abdominal cramps, nausea and diarrhea. Symptoms onset 2 days ago. EXAM: DG ABDOMEN ACUTE W/ 1V CHEST COMPARISON:  Chest x-ray dated 09/29/2005. FINDINGS: Single view of the chest: Heart size is normal. Overall cardiomediastinal silhouette is stable in size and configuration. Benign calcified granuloma noted at the right lung base. Lungs otherwise clear. No evidence of pneumonia. No pleural effusion or pneumothorax seen. Osseous structures about the chest are unremarkable. Supine and upright views of the abdomen: Mildly distended gas-filled loops of small bowel within the central abdomen and left lower quadrant, with associated air-fluid levels. No evidence of soft tissue mass or free intraperitoneal air. Degenerative changes noted within the thoracolumbar spine. No acute - appearing osseous abnormality. IMPRESSION: 1. Mildly distended gas-filled loops of small bowel within the central abdomen and left lower quadrant, with associated air-fluid levels. Findings suggest either partial small bowel obstruction or ileus. CT abdomen/pelvis may be helpful for further characterization. 2. Heart size is normal. No evidence of acute cardiopulmonary abnormality. Electronically Signed   By: Franki Cabot M.D.   On: 02/06/2016 14:09    Review of Systems  Constitutional: Positive for fever, chills and weight loss.  HENT: Negative.   Eyes: Negative for blurred vision.  Respiratory: Positive for shortness of breath.   Cardiovascular: Negative for chest  pain.  Gastrointestinal: Positive for nausea, vomiting and abdominal pain.  Genitourinary: Negative.   Musculoskeletal: Negative.   Skin: Negative.   Neurological: Negative.   Endo/Heme/Allergies: Bruises/bleeds easily.  Psychiatric/Behavioral: Negative.     Blood pressure 130/72, pulse 102, temperature 98.2 F (36.8 C), temperature source Oral, resp. rate 22, weight 130.182 kg (287 lb), SpO2 94 %. Physical Exam  Constitutional: She is oriented to person, place, and time. She appears well-developed and well-nourished.  HENT:  Head: Normocephalic.  Right Ear: External ear normal.  Left Ear: External ear normal.  Nose: Nose normal.  Mouth/Throat: Oropharynx is clear and moist.  Eyes: EOM are normal. Pupils are equal, round, and reactive to light.  Neck: Neck supple. No tracheal deviation present.  Cardiovascular: Normal rate, regular rhythm and normal heart sounds.   Respiratory: Effort normal and breath sounds normal. No stridor. No respiratory distress. She has no wheezes. She has no rales.  GI: Soft. She exhibits no distension. There is tenderness. There is no rebound and no guarding.  Tender in the right lower quadrant without guarding, but Galey and hernia palpable left lower quadrant with minimal tenderness, no generalized tenderness  Musculoskeletal: Normal range of motion.  Neurological: She is alert and oriented to person, place, and time. She exhibits normal muscle tone.  Skin: Skin is warm.  Psychiatric: She has a normal mood and affect.     Assessment/Plan Acute perforated appendicitis - IV Primaxin (PCN allergy), hold Xarelto, plan laparoscopic appendectomy possibly tomorrow afternoon once this wears off. I will discuss her case with my partner, Dr. Ninfa Linden. LLQ Spigelian hernia - observe for now Bilateral pulmonary emboli - holding Xarelto as above, will consult hospitalist service to assist with management of this and her other medical problems  Admit to stepdown I  spoke with her mother and fianc  Zenovia Jarred, MD 02/06/2016, 7:12 PM

## 2016-02-07 ENCOUNTER — Encounter (HOSPITAL_COMMUNITY): Payer: Self-pay | Admitting: General Surgery

## 2016-02-07 ENCOUNTER — Inpatient Hospital Stay (HOSPITAL_COMMUNITY): Payer: Medicaid Other | Admitting: Anesthesiology

## 2016-02-07 ENCOUNTER — Encounter (HOSPITAL_COMMUNITY): Admission: EM | Disposition: A | Discharge status: Home / Self Care | Payer: Self-pay | Source: Home / Self Care

## 2016-02-07 HISTORY — PX: LAPAROSCOPIC APPENDECTOMY: SHX408

## 2016-02-07 LAB — BASIC METABOLIC PANEL
ANION GAP: 13 (ref 5–15)
BUN: 27 mg/dL — ABNORMAL HIGH (ref 6–20)
CALCIUM: 8.2 mg/dL — AB (ref 8.9–10.3)
CO2: 20 mmol/L — ABNORMAL LOW (ref 22–32)
CREATININE: 1.43 mg/dL — AB (ref 0.44–1.00)
Chloride: 102 mmol/L (ref 101–111)
GFR, EST AFRICAN AMERICAN: 45 mL/min — AB (ref >60)
GFR, EST NON AFRICAN AMERICAN: 39 mL/min — AB (ref >60)
Glucose, Bld: 99 mg/dL (ref 65–99)
Potassium: 3.7 mmol/L (ref 3.5–5.1)
SODIUM: 135 mmol/L (ref 135–145)

## 2016-02-07 LAB — PROTIME-INR
INR: 1.84 — AB (ref 0.00–1.49)
PROTHROMBIN TIME: 21.2 s — AB (ref 11.6–15.2)

## 2016-02-07 LAB — MRSA PCR SCREENING: MRSA BY PCR: NEGATIVE

## 2016-02-07 LAB — CBC
HCT: 33.2 % — ABNORMAL LOW (ref 36.0–46.0)
HEMOGLOBIN: 10.6 g/dL — AB (ref 12.0–15.0)
MCH: 28 pg (ref 26.0–34.0)
MCHC: 31.9 g/dL (ref 30.0–36.0)
MCV: 87.6 fL (ref 78.0–100.0)
PLATELETS: 353 10*3/uL (ref 150–400)
RBC: 3.79 MIL/uL — AB (ref 3.87–5.11)
RDW: 14.5 % (ref 11.5–15.5)
WBC: 21.8 10*3/uL — AB (ref 4.0–10.5)

## 2016-02-07 LAB — LACTIC ACID, PLASMA: Lactic Acid, Venous: 0.5 mmol/L (ref 0.5–2.0)

## 2016-02-07 SURGERY — APPENDECTOMY, LAPAROSCOPIC
Anesthesia: General | Site: Abdomen

## 2016-02-07 MED ORDER — BUPIVACAINE-EPINEPHRINE 0.5% -1:200000 IJ SOLN
INTRAMUSCULAR | Status: DC | PRN
  Administered 2016-02-07: 20 mL

## 2016-02-07 MED ORDER — LACTATED RINGERS IV SOLN
INTRAVENOUS | Status: DC
  Administered 2016-02-07 (×2): via INTRAVENOUS

## 2016-02-07 MED ORDER — LIDOCAINE 2% (20 MG/ML) 5 ML SYRINGE
INTRAMUSCULAR | Status: AC
  Filled 2016-02-07: qty 5

## 2016-02-07 MED ORDER — ONDANSETRON HCL 4 MG/2ML IJ SOLN
INTRAMUSCULAR | Status: AC
  Filled 2016-02-07: qty 2

## 2016-02-07 MED ORDER — HYDROMORPHONE HCL 1 MG/ML IJ SOLN
INTRAMUSCULAR | Status: AC
  Filled 2016-02-07: qty 1

## 2016-02-07 MED ORDER — SUCCINYLCHOLINE CHLORIDE 20 MG/ML IJ SOLN
INTRAMUSCULAR | Status: DC | PRN
  Administered 2016-02-07: 60 mg via INTRAVENOUS

## 2016-02-07 MED ORDER — 0.9 % SODIUM CHLORIDE (POUR BTL) OPTIME
TOPICAL | Status: DC | PRN
  Administered 2016-02-07: 1000 mL

## 2016-02-07 MED ORDER — PROPOFOL 10 MG/ML IV BOLUS
INTRAVENOUS | Status: AC
  Filled 2016-02-07: qty 20

## 2016-02-07 MED ORDER — MORPHINE SULFATE (PF) 2 MG/ML IV SOLN
2.0000 mg | INTRAVENOUS | Status: DC | PRN
  Administered 2016-02-07 (×3): 2 mg via INTRAVENOUS
  Administered 2016-02-08: 4 mg via INTRAVENOUS
  Administered 2016-02-08: 2 mg via INTRAVENOUS
  Filled 2016-02-07: qty 1
  Filled 2016-02-07: qty 2
  Filled 2016-02-07 (×3): qty 1

## 2016-02-07 MED ORDER — BUPIVACAINE-EPINEPHRINE (PF) 0.25% -1:200000 IJ SOLN
INTRAMUSCULAR | Status: AC
  Filled 2016-02-07: qty 30

## 2016-02-07 MED ORDER — PROCHLORPERAZINE EDISYLATE 5 MG/ML IJ SOLN: 10.0000 mg | Freq: Once | INTRAMUSCULAR | Status: DC

## 2016-02-07 MED ORDER — SODIUM CHLORIDE 0.9 % IV SOLN
500.0000 mg | Freq: Three times a day (TID) | INTRAVENOUS | Status: DC
  Administered 2016-02-07 – 2016-02-09 (×5): 500 mg via INTRAVENOUS
  Filled 2016-02-07 (×6): qty 500

## 2016-02-07 MED ORDER — FENTANYL CITRATE (PF) 100 MCG/2ML IJ SOLN
INTRAMUSCULAR | Status: DC | PRN
  Administered 2016-02-07: 150 ug via INTRAVENOUS
  Administered 2016-02-07 (×2): 50 ug via INTRAVENOUS

## 2016-02-07 MED ORDER — ROCURONIUM BROMIDE 100 MG/10ML IV SOLN
INTRAVENOUS | Status: DC | PRN
  Administered 2016-02-07: 30 mg via INTRAVENOUS

## 2016-02-07 MED ORDER — SUGAMMADEX SODIUM 200 MG/2ML IV SOLN
INTRAVENOUS | Status: DC | PRN
  Administered 2016-02-07: 200 mg via INTRAVENOUS

## 2016-02-07 MED ORDER — LIDOCAINE HCL (CARDIAC) 20 MG/ML IV SOLN
INTRAVENOUS | Status: DC | PRN
  Administered 2016-02-07: 60 mg via INTRAVENOUS

## 2016-02-07 MED ORDER — FENTANYL CITRATE (PF) 250 MCG/5ML IJ SOLN
INTRAMUSCULAR | Status: AC
  Filled 2016-02-07: qty 5

## 2016-02-07 MED ORDER — KETOROLAC TROMETHAMINE 30 MG/ML IJ SOLN: 30.0000 mg | Freq: Once | INTRAMUSCULAR | Status: DC | PRN

## 2016-02-07 MED ORDER — SUCCINYLCHOLINE CHLORIDE 200 MG/10ML IV SOSY
PREFILLED_SYRINGE | INTRAVENOUS | Status: AC
  Filled 2016-02-07: qty 10

## 2016-02-07 MED ORDER — PROPOFOL 10 MG/ML IV BOLUS
INTRAVENOUS | Status: DC | PRN
  Administered 2016-02-07: 100 mg via INTRAVENOUS
  Administered 2016-02-07: 40 mg via INTRAVENOUS

## 2016-02-07 MED ORDER — DEXAMETHASONE SODIUM PHOSPHATE 4 MG/ML IJ SOLN
INTRAMUSCULAR | Status: DC | PRN
  Administered 2016-02-07: 4 mg via INTRAVENOUS

## 2016-02-07 MED ORDER — MIDAZOLAM HCL 2 MG/2ML IJ SOLN
INTRAMUSCULAR | Status: AC
  Filled 2016-02-07: qty 2

## 2016-02-07 MED ORDER — SODIUM CHLORIDE 0.9 % IV SOLN
500.0000 mg | Freq: Four times a day (QID) | INTRAVENOUS | Status: DC
  Administered 2016-02-07: 500 mg via INTRAVENOUS
  Filled 2016-02-07 (×3): qty 500

## 2016-02-07 MED ORDER — ONDANSETRON HCL 4 MG/2ML IJ SOLN
INTRAMUSCULAR | Status: DC | PRN
  Administered 2016-02-07: 4 mg via INTRAVENOUS

## 2016-02-07 MED ORDER — SODIUM CHLORIDE 0.9 % IR SOLN
Status: DC | PRN
  Administered 2016-02-07: 1000 mL

## 2016-02-07 MED ORDER — MIDAZOLAM HCL 5 MG/5ML IJ SOLN
INTRAMUSCULAR | Status: DC | PRN
  Administered 2016-02-07: 2 mg via INTRAVENOUS

## 2016-02-07 MED ORDER — HYDROMORPHONE HCL 1 MG/ML IJ SOLN
0.2500 mg | INTRAMUSCULAR | Status: DC | PRN
  Administered 2016-02-07 (×2): 0.5 mg via INTRAVENOUS

## 2016-02-07 MED ORDER — OXYCODONE-ACETAMINOPHEN 5-325 MG PO TABS
1.0000 | ORAL_TABLET | ORAL | Status: DC | PRN
  Administered 2016-02-08 – 2016-02-10 (×4): 2 via ORAL
  Filled 2016-02-07 (×5): qty 2

## 2016-02-07 SURGICAL SUPPLY — 49 items
APPLIER CLIP 5 13 M/L LIGAMAX5 (MISCELLANEOUS)
APPLIER CLIP ROT 10 11.4 M/L (STAPLE)
BIOPATCH RED 1 DISK 7.0 (GAUZE/BANDAGES/DRESSINGS) ×2 IMPLANT
BIOPATCH RED 1IN DISK 7.0MM (GAUZE/BANDAGES/DRESSINGS) ×1
CANISTER SUCTION 2500CC (MISCELLANEOUS) ×3 IMPLANT
CHLORAPREP W/TINT 26ML (MISCELLANEOUS) ×3 IMPLANT
CLIP APPLIE 5 13 M/L LIGAMAX5 (MISCELLANEOUS) IMPLANT
CLIP APPLIE ROT 10 11.4 M/L (STAPLE) IMPLANT
COVER SURGICAL LIGHT HANDLE (MISCELLANEOUS) ×3 IMPLANT
CUTTER FLEX LINEAR 45M (STAPLE) ×3 IMPLANT
DRAIN CHANNEL 19F RND (DRAIN) ×3 IMPLANT
DRSG TEGADERM 4X4.75 (GAUZE/BANDAGES/DRESSINGS) ×3 IMPLANT
ELECT REM PT RETURN 9FT ADLT (ELECTROSURGICAL) ×3
ELECTRODE REM PT RTRN 9FT ADLT (ELECTROSURGICAL) ×1 IMPLANT
ENDOLOOP SUT PDS II  0 18 (SUTURE)
ENDOLOOP SUT PDS II 0 18 (SUTURE) IMPLANT
EVACUATOR SILICONE 100CC (DRAIN) ×3 IMPLANT
GLOVE BIO SURGEON STRL SZ8 (GLOVE) ×3 IMPLANT
GLOVE BIOGEL PI IND STRL 7.5 (GLOVE) ×2 IMPLANT
GLOVE BIOGEL PI IND STRL 8.5 (GLOVE) ×1 IMPLANT
GLOVE BIOGEL PI INDICATOR 7.5 (GLOVE) ×4
GLOVE BIOGEL PI INDICATOR 8.5 (GLOVE) ×2
GLOVE ECLIPSE 7.5 STRL STRAW (GLOVE) ×3 IMPLANT
GLOVE SURG SIGNA 7.5 PF LTX (GLOVE) ×3 IMPLANT
GOWN STRL REUS W/ TWL LRG LVL3 (GOWN DISPOSABLE) ×2 IMPLANT
GOWN STRL REUS W/ TWL XL LVL3 (GOWN DISPOSABLE) ×2 IMPLANT
GOWN STRL REUS W/TWL LRG LVL3 (GOWN DISPOSABLE) ×4
GOWN STRL REUS W/TWL XL LVL3 (GOWN DISPOSABLE) ×4
KIT BASIN OR (CUSTOM PROCEDURE TRAY) ×3 IMPLANT
KIT ROOM TURNOVER OR (KITS) ×3 IMPLANT
LIQUID BAND (GAUZE/BANDAGES/DRESSINGS) ×3 IMPLANT
NS IRRIG 1000ML POUR BTL (IV SOLUTION) ×3 IMPLANT
PAD ARMBOARD 7.5X6 YLW CONV (MISCELLANEOUS) ×6 IMPLANT
POUCH SPECIMEN RETRIEVAL 10MM (ENDOMECHANICALS) ×3 IMPLANT
RELOAD CUTTER ETS 35MM STAND (ENDOMECHANICALS) IMPLANT
RELOAD STAPLE TA45 3.5 REG BLU (ENDOMECHANICALS) ×3 IMPLANT
SCALPEL HARMONIC ACE (MISCELLANEOUS) ×3 IMPLANT
SET IRRIG TUBING LAPAROSCOPIC (IRRIGATION / IRRIGATOR) ×3 IMPLANT
SLEEVE ENDOPATH XCEL 5M (ENDOMECHANICALS) ×3 IMPLANT
SPECIMEN JAR SMALL (MISCELLANEOUS) ×3 IMPLANT
SUT ETHILON 2 0 FS 18 (SUTURE) ×3 IMPLANT
SUT MON AB 4-0 PC3 18 (SUTURE) ×3 IMPLANT
SUT VICRYL 0 UR6 27IN ABS (SUTURE) ×3 IMPLANT
TOWEL OR 17X24 6PK STRL BLUE (TOWEL DISPOSABLE) ×3 IMPLANT
TOWEL OR 17X26 10 PK STRL BLUE (TOWEL DISPOSABLE) IMPLANT
TRAY LAPAROSCOPIC MC (CUSTOM PROCEDURE TRAY) ×3 IMPLANT
TROCAR XCEL BLUNT TIP 100MML (ENDOMECHANICALS) ×3 IMPLANT
TROCAR XCEL NON-BLD 5MMX100MML (ENDOMECHANICALS) ×3 IMPLANT
TUBING INSUFFLATION (TUBING) ×3 IMPLANT

## 2016-02-07 NOTE — Transfer of Care (Signed)
Immediate Anesthesia Transfer of Care Note  Patient: Erin Vasquez  Procedure(s) Performed: Procedure(s): LAPAROSCOPIC APPENDECTOMY (N/A)  Patient Location: PACU  Anesthesia Type:General  Level of Consciousness: awake, oriented and patient cooperative  Airway & Oxygen Therapy: Patient Spontanous Breathing and Patient connected to face mask oxygen  Post-op Assessment: Report given to RN and Post -op Vital signs reviewed and stable  Post vital signs: Reviewed  Last Vitals:  Filed Vitals:   02/07/16 0747 02/07/16 1412  BP: 106/96   Pulse: 100   Temp: 37.3 C 36.3 C  Resp: 20     Last Pain:  Filed Vitals:   02/07/16 1413  PainSc: 0-No pain      Patients Stated Pain Goal: 2 ([id number])  Complications: No apparent anesthesia complications

## 2016-02-07 NOTE — Anesthesia Preprocedure Evaluation (Addendum)
Anesthesia Evaluation  Patient identified by MRN, date of birth, ID band Patient awake    Reviewed: Allergy & Precautions, NPO status , Patient's Chart, lab work & pertinent test results  Airway Mallampati: II  TM Distance: <3 FB Neck ROM: Full    Dental no notable dental hx. (+) Teeth Intact, Dental Advisory Given   Pulmonary sleep apnea ,  Bilateral pulmonary embolism ([place]) 10/29/2015     Pulmonary exam normal breath sounds clear to auscultation       Cardiovascular negative cardio ROS Normal cardiovascular exam Rhythm:Regular Rate:Normal     Neuro/Psych negative neurological ROS  negative psych ROS   GI/Hepatic Neg liver ROS, GERD  ,  Endo/Other  Hypothyroidism Morbid obesity  Renal/GU negative Renal ROS  negative genitourinary   Musculoskeletal negative musculoskeletal ROS (+)   Abdominal (+) + obese,   Peds negative pediatric ROS (+)  Hematology negative hematology ROS (+)   Anesthesia Other Findings   Reproductive/Obstetrics negative OB ROS                            Anesthesia Physical Anesthesia Plan  ASA: III  Anesthesia Plan: General   Post-op Pain Management:    Induction: Intravenous  Airway Management Planned: Oral ETT  Additional Equipment:   Intra-op Plan:   Post-operative Plan: Extubation in OR  Informed Consent: I have reviewed the patients History and Physical, chart, labs and discussed the procedure including the risks, benefits and alternatives for the proposed anesthesia with the patient or authorized representative who has indicated his/her understanding and acceptance.   Dental advisory given  Plan Discussed with: CRNA and Surgeon  Anesthesia Plan Comments:         Anesthesia Quick Evaluation

## 2016-02-07 NOTE — Progress Notes (Signed)
Patient ID: Erin Vasquez, female   DOB: 25-Aug-1956, 60 y.o.   MRN: [id number]     [facility]      [address] [place], [place] [address]    Phone: [phone] FAX: [phone]     Subjective: sCr increased to 1.43.  WBC down from 23 to 21.8k. Afebrile.  INR 1.8. Pain is improved. No n/v.    Objective:  Vital signs:  Filed Vitals:   02/06/16 2250 02/06/16 2352 02/07/16 0454 02/07/16 0747  BP: 137/72 132/71 129/76 106/96  Pulse: 99 100 90 100  Temp: 99 F (37.2 C) 98.4 F (36.9 C) 98 F (36.7 C)   TempSrc: Oral Oral Oral   Resp: _0 Height: _1  (1.6 m)     Weight: 130.1 kg (286 lb 13.1 oz)     SpO2: 94% 92% 91% 96%       Intake/Output   Yesterday:  05/25 0701 - 05/26 0700 In: 850 [I.V.:700; IV Piggyback:150] Out: 80 [Urine:80] This shift:  Total I/O In: 1010 [P.O.:60; I.V.:950] Out: -    Physical Exam: General: Pt awake/alert/oriented x4 in no acute distress Chest: cta.  No chest wall pain w good excursion CV:  Pulses intact.  Regular rhythm MS: Normal AROM mjr joints.  No obvious deformity Abdomen: Soft.  Nondistended. TTP RLQ.   No evidence of peritonitis.  No incarcerated hernias. Ext:  SCDs BLE.  No mjr edema.  No cyanosis Skin: No petechiae / purpura   Problem List:   Principal Problem:   Ruptured appendicitis Active Problems:   Pulmonary embolism, bilateral (HCC)   Thyroid activity decreased   Hyponatremia   Sepsis, unspecified organism ([place])   Dehydration   GERD (gastroesophageal reflux disease)    Results:   Labs: Results for orders placed or performed during the hospital encounter of 02/06/16 (from the past 48 hour(s))  Lactic acid, plasma     Status: None   Collection Time: 02/06/16 12:12 AM  Result Value Ref Range   Lactic Acid, Venous 0.5 0.5 - 2.0 mmol/L  Comprehensive metabolic panel     Status: Abnormal   Collection Time: 02/06/16 12:23 PM  Result Value Ref Range    Sodium 133 (L) 135 - 145 mmol/L   Potassium 4.1 3.5 - 5.1 mmol/L   Chloride 100 (L) 101 - 111 mmol/L   CO2 19 (L) 22 - 32 mmol/L   Glucose, Bld 110 (H) 65 - 99 mg/dL   BUN 24 (H) 6 - 20 mg/dL   Creatinine, Ser 0.38 (L) 0.44 - 1.00 mg/dL   Calcium 9.0 8.9 - 10.3 mg/dL   Total Protein 7.6 6.5 - 8.1 g/dL   Albumin 2.9 (L) 3.5 - 5.0 g/dL   AST 27 15 - 41 U/L   ALT 36 14 - 54 U/L   Alkaline Phosphatase 105 38 - 126 U/L   Total Bilirubin 0.5 0.3 - 1.2 mg/dL   GFR calc non Af Amer >60 >60 mL/min   GFR calc Af Amer >60 >60 mL/min    Comment: (NOTE) The eGFR has been calculated using the CKD EPI equation. This calculation has not been validated in all clinical situations. eGFR's persistently <60 mL/min signify possible Chronic Kidney Disease.    Anion gap 14 5 - 15  CBC with Differential     Status: Abnormal   Collection Time: 02/06/16 12:23 PM  Result Value Ref Range   WBC 23.3 (H) 4.0 -  10.5 K/uL   RBC 4.42 3.87 - 5.11 MIL/uL   Hemoglobin 12.8 12.0 - 15.0 g/dL   HCT 38.3 36.0 - 46.0 %   MCV 86.7 78.0 - 100.0 fL   MCH 29.0 26.0 - 34.0 pg   MCHC 33.4 30.0 - 36.0 g/dL   RDW 14.2 11.5 - 15.5 %   Platelets 380 150 - 400 K/uL   Neutrophils Relative % 94 %   Neutro Abs 21.9 (H) 1.7 - 7.7 K/uL   Lymphocytes Relative 3 %   Lymphs Abs 0.7 0.7 - 4.0 K/uL   Monocytes Relative 2 %   Monocytes Absolute 0.5 0.1 - 1.0 K/uL   Eosinophils Relative 1 %   Eosinophils Absolute 0.2 0.0 - 0.7 K/uL   Basophils Relative 0 %   Basophils Absolute 0.0 0.0 - 0.1 K/uL  I-Stat CG4 Lactic Acid, ED     Status: None   Collection Time: 02/06/16 12:34 PM  Result Value Ref Range   Lactic Acid, Venous 1.01 0.5 - 2.0 mmol/L  Urinalysis, Routine w reflex microscopic (not at [facility])     Status: Abnormal   Collection Time: 02/06/16  4:50 PM  Result Value Ref Range   Color, Urine AMBER (A) YELLOW    Comment: BIOCHEMICALS MAY BE AFFECTED BY COLOR   APPearance HAZY (A) CLEAR   Specific Gravity, Urine 1.010 1.005  - 1.030   pH 5.5 5.0 - 8.0   Glucose, UA NEGATIVE NEGATIVE mg/dL   Hgb urine dipstick LARGE (A) NEGATIVE   Bilirubin Urine MODERATE (A) NEGATIVE   Ketones, ur 15 (A) NEGATIVE mg/dL   Protein, ur 30 (A) NEGATIVE mg/dL   Nitrite NEGATIVE NEGATIVE   Leukocytes, UA NEGATIVE NEGATIVE  Urine microscopic-add on     Status: Abnormal   Collection Time: 02/06/16  4:50 PM  Result Value Ref Range   Squamous Epithelial / LPF 0-5 (A) NONE SEEN   WBC, UA 0-5 0 - 5 WBC/hpf   RBC / HPF 6-30 0 - 5 RBC/hpf   Bacteria, UA FEW (A) NONE SEEN   Casts GRANULAR CAST (A) NEGATIVE    Comment: HYALINE CASTS   Urine-Other MUCOUS PRESENT   I-Stat CG4 Lactic Acid, ED     Status: Abnormal   Collection Time: 02/06/16  5:11 PM  Result Value Ref Range   Lactic Acid, Venous 2.05 (HH) 0.5 - 2.0 mmol/L   Comment NOTIFIED PHYSICIAN   Lactic acid, plasma     Status: None   Collection Time: 02/06/16  8:48 PM  Result Value Ref Range   Lactic Acid, Venous 1.7 0.5 - 2.0 mmol/L  Procalcitonin - Baseline     Status: None   Collection Time: 02/06/16  8:48 PM  Result Value Ref Range   Procalcitonin 1.88 ng/mL    Comment:        Interpretation: PCT > 0.5 ng/mL and <= 2 ng/mL: Systemic infection (sepsis) is possible, but other conditions are known to elevate PCT as well. (NOTE)         ICU PCT Algorithm               Non ICU PCT Algorithm    ----------------------------     ------------------------------         PCT < 0.25 ng/mL                 PCT < 0.1 ng/mL     Stopping of antibiotics  Stopping of antibiotics       strongly encouraged.               strongly encouraged.    ----------------------------     ------------------------------       PCT level decrease by               PCT < 0.25 ng/mL       >= 80% from peak PCT       OR PCT 0.25 - 0.5 ng/mL          Stopping of antibiotics                                             encouraged.     Stopping of antibiotics           encouraged.     ----------------------------     ------------------------------       PCT level decrease by              PCT >= 0.25 ng/mL       < 80% from peak PCT        AND PCT >= 0.5 ng/mL             Continuing antibiotics                                              encouraged.       Continuing antibiotics            encouraged.    ----------------------------     ------------------------------     PCT level increase compared          PCT > 0.5 ng/mL         with peak PCT AND          PCT >= 0.5 ng/mL             Escalation of antibiotics                                          strongly encouraged.      Escalation of antibiotics        strongly encouraged.   MRSA PCR Screening     Status: None   Collection Time: 02/06/16 10:39 PM  Result Value Ref Range   MRSA by PCR NEGATIVE NEGATIVE    Comment:        The GeneXpert MRSA Assay (FDA approved for NASAL specimens only), is one component of a comprehensive MRSA colonization surveillance program. It is not intended to diagnose MRSA infection nor to guide or monitor treatment for MRSA infections.   Protime-INR     Status: Abnormal   Collection Time: 02/07/16 12:01 AM  Result Value Ref Range   Prothrombin Time 21.2 (H) 11.6 - 15.2 seconds   INR 1.84 (H) 0.00 - 4.70  Basic metabolic panel     Status: Abnormal   Collection Time: 02/07/16 12:01 AM  Result Value Ref Range   Sodium 135 135 - 145 mmol/L   Potassium 3.7 3.5 - 5.1 mmol/L   Chloride 102 101 - 111 mmol/L   CO2 20 (L)  22 - 32 mmol/L   Glucose, Bld 99 65 - 99 mg/dL   BUN 27 (H) 6 - 20 mg/dL   Creatinine, Ser 1.43 (H) 0.44 - 1.00 mg/dL    Comment: DELTA CHECK NOTED   Calcium 8.2 (L) 8.9 - 10.3 mg/dL   GFR calc non Af Amer 39 (L) >60 mL/min   GFR calc Af Amer 45 (L) >60 mL/min    Comment: (NOTE) The eGFR has been calculated using the CKD EPI equation. This calculation has not been validated in all clinical situations. eGFR's persistently <60 mL/min signify possible Chronic  Kidney Disease.    Anion gap 13 5 - 15  CBC     Status: Abnormal   Collection Time: 02/07/16 12:01 AM  Result Value Ref Range   WBC 21.8 (H) 4.0 - 10.5 K/uL   RBC 3.79 (L) 3.87 - 5.11 MIL/uL   Hemoglobin 10.6 (L) 12.0 - 15.0 g/dL   HCT 33.2 (L) 36.0 - 46.0 %   MCV 87.6 78.0 - 100.0 fL   MCH 28.0 26.0 - 34.0 pg   MCHC 31.9 30.0 - 36.0 g/dL   RDW 14.5 11.5 - 15.5 %   Platelets 353 150 - 400 K/uL    Imaging / Studies: Ct Abdomen Pelvis W Contrast  02/06/2016  CLINICAL DATA:  Left lower quadrant abdominal pain for 10 days with some constipation, diarrhea and vomiting. Leukocytosis. Initial encounter. EXAM: CT ABDOMEN AND PELVIS WITH CONTRAST TECHNIQUE: Multidetector CT imaging of the abdomen and pelvis was performed using the standard protocol following bolus administration of intravenous contrast. CONTRAST:  158m ISOVUE-300 IOPAMIDOL (ISOVUE-300) INJECTION 61% COMPARISON:  Limited correlation made with chest CT 10/29/2015. Acute abdominal series done today. FINDINGS: Lower chest: There is a calcified right lower lobe granuloma and calcified right hilar lymph nodes. The lungs are otherwise clear. There is a small to moderate hiatal hernia and mild cardiomegaly. Hepatobiliary: The liver is normal in density without focal abnormality. No evidence of gallstones, gallbladder wall thickening or biliary dilatation. Pancreas: Unremarkable. No pancreatic ductal dilatation or surrounding inflammatory changes. Spleen: Normal in size without focal abnormality. Adrenals/Urinary Tract: Both adrenal glands appear normal. Both kidneys appear normal. There is no evidence of urinary tract calculus or hydronephrosis. The bladder is incompletely distended, although demonstrates probable mild wall thickening. Stomach/Bowel: As above, there is a small to moderate hiatal hernia. The stomach and small bowel demonstrate no significant findings. There is a small amount of fluid throughout the small bowel which is not  significantly dilated. There is a right lower quadrant inflammatory process inferior to the cecum within extraluminal fluid collection measuring 4.5 x 2.2 cm on image 81. The appendix is not clearly visualized. There is a calcification on image 71 which could reflect an at appendicolith. There are inflammatory changes throughout the right lower quadrant. The colon is decompressed. There is no focal colonic wall thickening or pericolonic inflammation. Vascular/Lymphatic: There are no enlarged abdominal or pelvic lymph nodes. Mild aortic and branch vessel atherosclerosis. Reproductive: Hysterectomy. No evidence of adnexal mass. There is some free pelvic fluid. Other: Tiny umbilical hernia containing only fat. 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 Musculoskeletal: No acute or significant osseous findings. Mild lumbar spondylosis. IMPRESSION: 1. Extensive right lower quadrant inflammatory process with diffuse inflammatory changes and an extraluminal fluid collection adjacent to the cecal tip, most consistent with perforated appendicitis. 2. No evidence of diverticulitis or bowel obstruction. 3. Small abdominal wall hernias containing only fat. 4. These results  were called by telephone at the time of interpretation on 02/06/2016 at 4:22 pm to Dr. Lonia Skinner, who verbally acknowledged these results. Electronically Signed   By: Richardean Sale M.D.   On: 02/06/2016 16:23   Dg Abd Acute W/chest  02/06/2016  CLINICAL DATA:  Shakes, fever, chills, decreased appetite, abdominal cramps, nausea and diarrhea. Symptoms onset 2 days ago. EXAM: DG ABDOMEN ACUTE W/ 1V CHEST COMPARISON:  Chest x-ray dated 09/29/2005. FINDINGS: Single view of the chest: Heart size is normal. Overall cardiomediastinal silhouette is stable in size and configuration. Benign calcified granuloma noted at the right lung base. Lungs otherwise clear. No evidence of pneumonia. No pleural effusion or pneumothorax seen. Osseous  structures about the chest are unremarkable. Supine and upright views of the abdomen: Mildly distended gas-filled loops of small bowel within the central abdomen and left lower quadrant, with associated air-fluid levels. No evidence of soft tissue mass or free intraperitoneal air. Degenerative changes noted within the thoracolumbar spine. No acute - appearing osseous abnormality. IMPRESSION: 1. Mildly distended gas-filled loops of small bowel within the central abdomen and left lower quadrant, with associated air-fluid levels. Findings suggest either partial small bowel obstruction or ileus. CT abdomen/pelvis may be helpful for further characterization. 2. Heart size is normal. No evidence of acute cardiopulmonary abnormality. Electronically Signed   By: Franki Cabot M.D.   On: 02/06/2016 14:09    Medications / Allergies:  Scheduled Meds: . imipenem-cilastatin  500 mg Intravenous Q6H  . levothyroxine  50 mcg Oral QAC breakfast  . pantoprazole (PROTONIX) IV  40 mg Intravenous QHS   Continuous Infusions: . dextrose 5 % and 0.45 % NaCl with KCl 20 mEq/L 100 mL/hr at 02/07/16 0830   PRN Meds:.diphenhydrAMINE **OR** diphenhydrAMINE, morphine injection, ondansetron **OR** ondansetron (ZOFRAN) IV  Antibiotics: Anti-infectives    Start     Dose/Rate Route Frequency Ordered Stop   02/07/16 2300  imipenem-cilastatin (PRIMAXIN) 500 mg in sodium chloride 0.9 % 100 mL IVPB  Status:  Discontinued     500 mg 200 mL/hr over 30 Minutes Intravenous Every 6 hours 02/06/16 2157 02/07/16 0832   02/07/16 1000  imipenem-cilastatin (PRIMAXIN) 500 mg in sodium chloride 0.9 % 100 mL IVPB     500 mg 200 mL/hr over 30 Minutes Intravenous Every 6 hours 02/07/16 0832     02/06/16 1645  ceFEPIme (MAXIPIME) 2 g in dextrose 5 % 50 mL IVPB     2 g 100 mL/hr over 30 Minutes Intravenous  Once 02/06/16 1641 02/06/16 1740   02/06/16 1645  metroNIDAZOLE (FLAGYL) IVPB 500 mg     500 mg 100 mL/hr over 60 Minutes Intravenous   Once 02/06/16 1641 02/06/16 1823        Assessment/Plan Acute perforated appendicitis-to OR today for a laparoscopic appendectomy today Acute renal insufficiency-continue IV hydration, avoid nephrotoxins, repeat labs in AM.  Appreciate medicine input DVT/PE-Xarelto on hold.  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  Followed by Dr. Cindee Salt, followed by Dr. Carloyn Jaeger spigelian Naaman Plummer  ID-primaxin D#1 Evon Slack, IVF VTE prophylaxis-will address post op  Dispo-SDU/OR  Erby Pian, [facility] Pager [phone](7A-4:30P) For consults and floor pages call [phone](7A-4:30P)  02/07/2016 9:26 AM

## 2016-02-07 NOTE — Anesthesia Postprocedure Evaluation (Signed)
Anesthesia Post Note  Patient: Erin Vasquez  Procedure(s) Performed: Procedure(s) (LRB): LAPAROSCOPIC APPENDECTOMY (N/A)  Patient location during evaluation: PACU Anesthesia Type: General Level of consciousness: awake and alert Pain management: pain level controlled Vital Signs Assessment: post-procedure vital signs reviewed and stable Respiratory status: spontaneous breathing, nonlabored ventilation, respiratory function stable and patient connected to nasal cannula oxygen Cardiovascular status: blood pressure returned to baseline and stable Postop Assessment: no signs of nausea or vomiting Anesthetic complications: no    Last Vitals:  Filed Vitals:   02/07/16 1427 02/07/16 1430  BP: 143/71   Pulse: 97 99  Temp:    Resp: 26 19    Last Pain:  Filed Vitals:   02/07/16 1434  PainSc: 8                  Maccoy Haubner S

## 2016-02-07 NOTE — Progress Notes (Signed)
Pt refuseng CPAP for the night stated she did not wear one. Informed pt that if she felt she needed CPAP during the night have her RN notify this RT.

## 2016-02-07 NOTE — Progress Notes (Signed)
Nutrition Brief Note  Patient identified on the Malnutrition Screening Tool (MST) Report. Patient with variable weight over the past 3 months, ? related to fluid status. Per RN assessment, patient with edema this AM. 4% weight loss over the past 2 months is insignificant.   Wt Readings from Last 15 Encounters:  02/06/16 286 lb 13.1 oz (130.1 kg)  12/03/15 297 lb 4.8 oz (134.854 kg)  10/29/15 257 lb (116.574 kg)  09/11/14 279 lb (126.554 kg)  09/04/14 279 lb 9.6 oz (126.826 kg)  08/02/13 300 lb (136.079 kg)    Body mass index is 50.82 kg/(m^2). Patient meets criteria for class 3, extreme/morbid obesity based on current BMI.   Current diet order is NPO, patient going for appendectomy today. Labs and medications reviewed.   No nutrition interventions warranted at this time. If nutrition issues arise, please consult RD.   Molli Barrows, RD, LDN, [place] Pager [phone] After Hours Pager [phone]

## 2016-02-07 NOTE — Anesthesia Procedure Notes (Signed)
Procedure Name: Intubation Date/Time: 02/07/2016 1:07 PM Performed by: Jenne Campus Pre-anesthesia Checklist: Patient identified, Emergency Drugs available, Suction available and Patient being monitored Patient Re-evaluated:Patient Re-evaluated prior to inductionOxygen Delivery Method: Circle System Utilized Preoxygenation: Pre-oxygenation with 100% oxygen Intubation Type: IV induction Ventilation: Mask ventilation without difficulty Laryngoscope Size: Miller and 2 Grade View: Grade I Tube type: Oral Tube size: 7.0 mm Number of attempts: 1 Airway Equipment and Method: Stylet and Oral airway Placement Confirmation: ETT inserted through vocal cords under direct vision,  positive ETCO2 and breath sounds checked- equal and bilateral Secured at: 21 cm Tube secured with: Tape Dental Injury: Teeth and Oropharynx as per pre-operative assessment

## 2016-02-07 NOTE — Op Note (Signed)
LAPAROSCOPIC APPENDECTOMY  Procedure Note  KEIDY MCKESSON 02/06/2016 - 02/07/2016   Pre-op Diagnosis: PERFORATED APPENDICITIS     Post-op Diagnosis: PERFORATED APPENDICITIS  Procedure(s): LAPAROSCOPIC APPENDECTOMY  Surgeon(s): Coralie Keens, MD  Anesthesia: General  Staff:  Circulator: Milas Kocher, RN Scrub Person: Adella Hare; Cyd Silence, RN Circulator Assistant: Cyd Silence, RN  Estimated Blood Loss: Minimal               Specimens: sent to path          [facility] A   Date: 02/07/2016  Time: 1:50 PM

## 2016-02-07 NOTE — Progress Notes (Signed)
PROGRESS NOTE  Erin Vasquez [id number] DOB: Nov 01, 1955 DOA: 02/06/2016 PCP: Barrie Lyme, FNP  Brief History:  60 year old female with a history of DVT/PE and underlying lupus anticoagulant, hypothyroidism, GERD with Schatzki's ring presented with 10 day history of abdominal pain, fevers, chills.  The patient was diagnosed with bilateral pulmonary emboli on 10/29/2015 with DVT of the left lower extremity during that time. The patient was started on rivaroxaban. Hematologic evaluation revealed lupus anticoagulant. She has been compliant with the medications.  Because of her worsening abdominal pain she presented to emergency Department. CT of the abdomen and pelvis revealed right lower quadrant diffuse inflammation with extraluminal fluid at the cecal tip consistent with perforated appendicitis.  The patient was initially started on cefepime and metronidazole. This was switched to imipenem. Initial WBC 23.3 with lactic acid 1.7.  The patient was admitted under the surgical team, and internal medicine consultation was obtained  Assessment/Plan: Perforated appendicitis -Surgical intervention is planned 02/07/2016 -Remain off rivaroxaban -Continue imipenem -Continue intravenous fluids -Lactic acid 1.7, procalcitonin 1.88 -Pain control  -Follow blood cultures -The patient is a low to moderate risk for surgery, but no further intervention will change her risk at this time  Bilateral PE/left lower extremity DVT--hypercoagulable state -Diagnosed 10/29/2015 -Further workup revealed lupus anticoagulant -Last dose rivaroxaban 02/05/2016 at [facility] rivaroxaban when okay with surgical service -If additional surgery/intervention is needed--plan to start the patient on intravenous heparin  Acute kidney injury -Likely secondary to hemodynamic changes and infectious process -Serum creatinine increased from 0.38--> 1.43 -The patient did receive intravenous dye on  02/06/2016  Hyponatremia -Secondary to volume depletion/dehydration -Continue intravenous fluids -Improving with fluids  Hypothyroidism -Continue present dose of levothyroxine  GERD -Continue ranitidine -November 2014 EGD revealed Schatzki's ring which was dilated   Disposition Plan:   Not stable for d/c--remain in stepdown Family Communication:   Mother updated at beside  Code Status:  FULL     Subjective: Patient complains of some nausea without emesis. Abdominal pain is essentially unchanged. Denies fevers, chills, headache, neck pain, hematochezia, melena. No dysuria or hematuria.  Objective: Filed Vitals:   02/06/16 2116 02/06/16 2250 02/06/16 2352 02/07/16 0454  BP: 114/71 137/72 132/71 129/76  Pulse: 107 99 100 90  Temp:  99 F (37.2 C) 98.4 F (36.9 C) 98 F (36.7 C)  TempSrc:  Oral Oral Oral  Resp: 21 18 15 17   Height:  5\' 3"  (1.6 m)    Weight:  130.1 kg (286 lb 13.1 oz)    SpO2: 93% 94% 92% 91%    Intake/Output Summary (Last 24 hours) at 02/07/16 [id number] Last data filed at 02/06/16 2300  Gross per 24 hour  Intake    850 ml  Output     80 ml  Net    770 ml   Weight change:  Exam:   General:  Pt is alert, follows commands appropriately, not in acute distress  HEENT: No icterus, No thrush, No neck mass, /AT  Cardiovascular: RRR, S1/S2, no rubs, no gallops  Respiratory: Bibasilar crackles. No wheezes.  Abdomen: Soft/+BS, RLQ without rebound, non distended, no guarding  Extremities: No edema, No lymphangitis, No petechiae, No rashes, no synovitis   Data Reviewed: I have personally reviewed following labs and imaging studies Basic Metabolic Panel:  Recent Labs Lab 02/06/16 1223 02/07/16 0001  NA 133* 135  K 4.1 3.7  CL 100* 102  CO2 19* 20*  GLUCOSE 110* 99  BUN 24* 27*  CREATININE 0.38* 1.43*  CALCIUM 9.0 8.2*   Liver Function Tests:  Recent Labs Lab 02/06/16 1223  AST 27  ALT 36  ALKPHOS 105  BILITOT 0.5  PROT 7.6   ALBUMIN 2.9*   No results for input(s): LIPASE, AMYLASE in the last 168 hours. No results for input(s): AMMONIA in the last 168 hours. Coagulation Profile:  Recent Labs Lab 02/07/16 0001  INR 1.84*   CBC:  Recent Labs Lab 02/06/16 1223 02/07/16 0001  WBC 23.3* 21.8*  NEUTROABS 21.9*  --   HGB 12.8 10.6*  HCT 38.3 33.2*  MCV 86.7 87.6  PLT 380 353   Cardiac Enzymes: No results for input(s): CKTOTAL, CKMB, CKMBINDEX, TROPONINI in the last 168 hours. BNP: Invalid input(s): POCBNP CBG: No results for input(s): GLUCAP in the last 168 hours. HbA1C: No results for input(s): HGBA1C in the last 72 hours. Urine analysis:    Component Value Date/Time   COLORURINE AMBER* 02/06/2016 1650   APPEARANCEUR HAZY* 02/06/2016 1650   LABSPEC 1.010 02/06/2016 1650   PHURINE 5.5 02/06/2016 1650   GLUCOSEU NEGATIVE 02/06/2016 1650   HGBUR LARGE* 02/06/2016 1650   BILIRUBINUR MODERATE* 02/06/2016 1650   KETONESUR 15* 02/06/2016 1650   PROTEINUR 30* 02/06/2016 1650   NITRITE NEGATIVE 02/06/2016 1650   LEUKOCYTESUR NEGATIVE 02/06/2016 1650   Sepsis Labs: @LABRCNTIP (procalcitonin:4,lacticidven:4) ) Recent Results (from the past 240 hour(s))  MRSA PCR Screening     Status: None   Collection Time: 02/06/16 10:39 PM  Result Value Ref Range Status   MRSA by PCR NEGATIVE NEGATIVE Final    Comment:        The GeneXpert MRSA Assay (FDA approved for NASAL specimens only), is one component of a comprehensive MRSA colonization surveillance program. It is not intended to diagnose MRSA infection nor to guide or monitor treatment for MRSA infections.      Scheduled Meds: . imipenem-cilastatin  500 mg Intravenous Q6H  . levothyroxine  50 mcg Oral QAC breakfast  . pantoprazole (PROTONIX) IV  40 mg Intravenous QHS   Continuous Infusions: . dextrose 5 % and 0.45 % NaCl with KCl 20 mEq/L 100 mL/hr at 02/06/16 2244    Procedures/Studies: Ct Abdomen Pelvis W Contrast  02/06/2016   CLINICAL DATA:  Left lower quadrant abdominal pain for 10 days with some constipation, diarrhea and vomiting. Leukocytosis. Initial encounter. EXAM: CT ABDOMEN AND PELVIS WITH CONTRAST TECHNIQUE: Multidetector CT imaging of the abdomen and pelvis was performed using the standard protocol following bolus administration of intravenous contrast. CONTRAST:  177mL ISOVUE-300 IOPAMIDOL (ISOVUE-300) INJECTION 61% COMPARISON:  Limited correlation made with chest CT 10/29/2015. Acute abdominal series done today. FINDINGS: Lower chest: There is a calcified right lower lobe granuloma and calcified right hilar lymph nodes. The lungs are otherwise clear. There is a small to moderate hiatal hernia and mild cardiomegaly. Hepatobiliary: The liver is normal in density without focal abnormality. No evidence of gallstones, gallbladder wall thickening or biliary dilatation. Pancreas: Unremarkable. No pancreatic ductal dilatation or surrounding inflammatory changes. Spleen: Normal in size without focal abnormality. Adrenals/Urinary Tract: Both adrenal glands appear normal. Both kidneys appear normal. There is no evidence of urinary tract calculus or hydronephrosis. The bladder is incompletely distended, although demonstrates probable mild wall thickening. Stomach/Bowel: As above, there is a small to moderate hiatal hernia. The stomach and small bowel demonstrate no significant findings. There is a small amount of fluid throughout the small bowel which is not significantly dilated. There is a right  lower quadrant inflammatory process inferior to the cecum within extraluminal fluid collection measuring 4.5 x 2.2 cm on image 81. The appendix is not clearly visualized. There is a calcification on image 71 which could reflect an at appendicolith. There are inflammatory changes throughout the right lower quadrant. The colon is decompressed. There is no focal colonic wall thickening or pericolonic inflammation. Vascular/Lymphatic: There are no  enlarged abdominal or pelvic lymph nodes. Mild aortic and branch vessel atherosclerosis. Reproductive: Hysterectomy. No evidence of adnexal mass. There is some free pelvic fluid. Other: Tiny umbilical hernia containing only fat. 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 Musculoskeletal: No acute or significant osseous findings. Mild lumbar spondylosis. IMPRESSION: 1. Extensive right lower quadrant inflammatory process with diffuse inflammatory changes and an extraluminal fluid collection adjacent to the cecal tip, most consistent with perforated appendicitis. 2. No evidence of diverticulitis or bowel obstruction. 3. Small abdominal wall hernias containing only fat. 4. These results were called by telephone at the time of interpretation on 02/06/2016 at 4:22 pm to Dr. Lonia Skinner, who verbally acknowledged these results. Electronically Signed   By: Richardean Sale M.D.   On: 02/06/2016 16:23   Dg Abd Acute W/chest  02/06/2016  CLINICAL DATA:  Shakes, fever, chills, decreased appetite, abdominal cramps, nausea and diarrhea. Symptoms onset 2 days ago. EXAM: DG ABDOMEN ACUTE W/ 1V CHEST COMPARISON:  Chest x-ray dated 09/29/2005. FINDINGS: Single view of the chest: Heart size is normal. Overall cardiomediastinal silhouette is stable in size and configuration. Benign calcified granuloma noted at the right lung base. Lungs otherwise clear. No evidence of pneumonia. No pleural effusion or pneumothorax seen. Osseous structures about the chest are unremarkable. Supine and upright views of the abdomen: Mildly distended gas-filled loops of small bowel within the central abdomen and left lower quadrant, with associated air-fluid levels. No evidence of soft tissue mass or free intraperitoneal air. Degenerative changes noted within the thoracolumbar spine. No acute - appearing osseous abnormality. IMPRESSION: 1. Mildly distended gas-filled loops of small bowel within the central abdomen and left lower  quadrant, with associated air-fluid levels. Findings suggest either partial small bowel obstruction or ileus. CT abdomen/pelvis may be helpful for further characterization. 2. Heart size is normal. No evidence of acute cardiopulmonary abnormality. Electronically Signed   By: Franki Cabot M.D.   On: 02/06/2016 14:09    Izaha Shughart, DO  [facility] Pager [phone]  If 7PM-7AM, please contact night-coverage [email or web address] Password [email or web address] 02/07/2016, 8:21 AM   LOS: 1 day

## 2016-02-08 DIAGNOSIS — N179 Acute kidney failure, unspecified: Secondary | ICD-10-CM

## 2016-02-08 LAB — CBC
HCT: 33.3 % — ABNORMAL LOW (ref 36.0–46.0)
HEMOGLOBIN: 10.3 g/dL — AB (ref 12.0–15.0)
MCH: 27.5 pg (ref 26.0–34.0)
MCHC: 30.9 g/dL (ref 30.0–36.0)
MCV: 89 fL (ref 78.0–100.0)
Platelets: 348 10*3/uL (ref 150–400)
RBC: 3.74 MIL/uL — AB (ref 3.87–5.11)
RDW: 14.7 % (ref 11.5–15.5)
WBC: 16.3 10*3/uL — AB (ref 4.0–10.5)

## 2016-02-08 LAB — BASIC METABOLIC PANEL
ANION GAP: 9 (ref 5–15)
BUN: 22 mg/dL — ABNORMAL HIGH (ref 6–20)
CALCIUM: 8.2 mg/dL — AB (ref 8.9–10.3)
CO2: 21 mmol/L — AB (ref 22–32)
Chloride: 104 mmol/L (ref 101–111)
Creatinine, Ser: 1.07 mg/dL — ABNORMAL HIGH (ref 0.44–1.00)
GFR, EST NON AFRICAN AMERICAN: 55 mL/min — AB (ref >60)
Glucose, Bld: 146 mg/dL — ABNORMAL HIGH (ref 65–99)
Potassium: 4.7 mmol/L (ref 3.5–5.1)
SODIUM: 134 mmol/L — AB (ref 135–145)

## 2016-02-08 LAB — PROCALCITONIN: PROCALCITONIN: 0.83 ng/mL

## 2016-02-08 LAB — HEPARIN LEVEL (UNFRACTIONATED)

## 2016-02-08 MED ORDER — HYDROMORPHONE HCL 1 MG/ML IJ SOLN: 0.3000 mg | INTRAMUSCULAR | Status: DC | PRN

## 2016-02-08 MED ORDER — HEPARIN (PORCINE) IN NACL 100-0.45 UNIT/ML-% IJ SOLN
2400.0000 [IU]/h | INTRAMUSCULAR | Status: AC
  Administered 2016-02-08: 1300 [IU]/h via INTRAVENOUS
  Administered 2016-02-09: 1550 [IU]/h via INTRAVENOUS
  Administered 2016-02-10 (×2): 2000 [IU]/h via INTRAVENOUS
  Administered 2016-02-11: 2250 [IU]/h via INTRAVENOUS
  Administered 2016-02-11: 2000 [IU]/h via INTRAVENOUS
  Administered 2016-02-12: 2250 [IU]/h via INTRAVENOUS
  Filled 2016-02-08 (×10): qty 250

## 2016-02-08 NOTE — Care Management Note (Signed)
Case Management Note  Patient Details  Name: Erin Vasquez MRN: [id number] Date of Birth: 1956/03/11  Subjective/Objective:  Patient is from home,  s/p perforated appendicitis,  NCM will cont to follow for dc needs.                 Action/Plan:   Expected Discharge Date:                  Expected Discharge Plan:  Home/Self Care  In-House Referral:     Discharge planning Services  CM Consult  Post Acute Care Choice:    Choice offered to:     DME Arranged:    DME Agency:     HH Arranged:    HH Agency:     Status of Service:  In process, will continue to follow  Medicare Important Message Given:    Date Medicare IM Given:    Medicare IM give by:    Date Additional Medicare IM Given:    Additional Medicare Important Message give by:     If discussed at [place] of Stay Meetings, dates discussed:    Additional Comments:  Ollivia, Faubert, RN 02/08/2016, 8:28 AM

## 2016-02-08 NOTE — Op Note (Signed)
NAMEDEJAHNAE, HOLSTER NO.:  [id number]  MEDICAL RECORD NO.:  [id number]  LOCATION:  3S05C                        FACILITY:  [facility]  PHYSICIAN:  Coralie Keens, M.D. DATE OF BIRTH:  06/06/1956  DATE OF PROCEDURE:  02/07/2016 DATE OF DISCHARGE:                              OPERATIVE REPORT   PREOPERATIVE DIAGNOSIS:  Perforated appendicitis.  POSTOPERATIVE DIAGNOSIS:  Perforated appendicitis.  PROCEDURE:  Laparoscopic appendectomy.  SURGEON:  Coralie Keens, M.D.  ANESTHESIA:  General with 0.25% Marcaine.  ESTIMATED BLOOD LOSS:  Minimal.  INDICATIONS:  This is a 60 year old female who has had a previous hysterectomy from endometrial cancer and now presents with at least a week-long history of abdominal pain.  She had a CAT scan showing findings consistent with perforated appendicitis.  She has been on Xarelto secondary to DVTs.  She was admitted and the Xarelto was held and now she presents to the operating room for appendectomy.  FINDINGS:  The patient was found to have perforated appendicitis with large amount of purulence in the right lower quadrant and a lot of fibrinous exudate with interloop small bowel abscesses.  No other intraabdominal pathology was identified.  PROCEDURE IN DETAIL:  The patient was brought to the operating room, identified as Erin Vasquez.  She has placed supine on the operating table and general anesthesia was induced.  Her abdomen was prepped and draped in usual sterile fashion.  I made a small vertical incision below the umbilicus with a scalpel.  I took this down to the fascia, which was then opened with scalpel.  A hemostat was then used to pass into the peritoneal cavity under direct vision.  A 0 Vicryl pursestring suture was placed around the fascial opening.  The Hasson port was placed through the opening and insufflation of the abdomen was begun.  I then placed a 5-mm port in the patient's right upper quadrant and  another in the left lower quadrant both under direct vision.  The patient had looped a small bowel stuck to the abdominal wall, which I was able to peel away.  I then identified multiple loops of small bowel with fibrinous exudate attaching them and purulent fluid in the pelvis.  I then identified the cecum and found the appendix.  When I peeled it away from the abdominal sidewall, there was a large abscess that I encountered for purulence.  It was apparent that this was from perforated appendicitis.  I was actually able to identify the base of the appendix and then transected with laparoscopic GIA stapler.  I then identified the mesoappendix and taken down with the Harmonic scalpel.  I then was able to pull the rest of the appendix out of the retroperitoneum bluntly.  Again, the appendix appeared perforated.  I then placed an Endosac and removed it through the incision at the umbilicus.  I then copiously irrigated the abdomen with normal saline. I then made a separate skin incision in the patient's right lower quadrant.  I placed a 19-French Blake drain through the umbilical port and then under direct vision, used a Kelly clamp to begin entrance into the abdominal cavity and pulled the [place] drain tubing out through this  hole.  I then placed the drain into the right lower quadrant going down into the pelvis.  This was sutured in place with a nylon suture.  At this point, hemostasis appeared to be achieved.  I removed the umbilical trocar and tied off the pursestring 0 Vicryl suture.  I placed another figure-of-eight 0 Vicryl suture in the umbilical fascia as well.  All ports were removed under direct vision and the abdomen was deflated. All incisions were then anesthetized with Marcaine and closed with 4-0 Monocryl subcuticular sutures.  Skin glue was then applied.  The patient tolerated the procedure well.  All the counts were correct at the end of the procedure.  The patient was then  extubated in the operating room and taken in a stable condition to the recovery room.     Coralie Keens, M.D.     DB/MEDQ  D:  02/07/2016  T:  02/08/2016  Job:  [id number]

## 2016-02-08 NOTE — Progress Notes (Signed)
ANTICOAGULATION CONSULT NOTE - Initial Consult  Pharmacy Consult for Heparin Indication: pulmonary embolus  Allergies  Allergen Reactions  . Bee Venom Swelling and Other (See Comments)    Patient is allergic to bee venom, mosquitos, wasps, yellow jackets and bees. Wherever she gets stung that area swells, turns hot and has fevers.    . Other Anaphylaxis and Shortness Of Breath    Hickory smoke  . Food Other (See Comments)    Tangerines. Unknown childhood allergy.   . Morphine And Related Itching  . Penicillins Other (See Comments)    UNKNOWN: CHILDHOOD ALLERGY     Patient Measurements: Height: 5\' 3"  (160 cm) Weight: 286 lb 13.1 oz (130.1 kg) IBW/kg (Calculated) : 52.4 Heparin Dosing Weight: 85 kg  Vital Signs: Temp: 97.6 F (36.4 C) (05/27 0734) Temp Source: Oral (05/27 0734) BP: 136/76 mmHg (05/27 1242) Pulse Rate: 98 (05/27 1242)  Labs:  Recent Labs  02/06/16 1223 02/07/16 0001 02/08/16 0520  HGB 12.8 10.6* 10.3*  HCT 38.3 33.2* 33.3*  PLT 380 353 348  LABPROT  --  21.2*  --   INR  --  1.84*  --   CREATININE 0.38* 1.43* 1.07*    Estimated Creatinine Clearance: 73.7 mL/min (by C-G formula based on Cr of 1.07).   Medical History: Past Medical History  Diagnosis Date  . Thyroid disease   . Hypothyroidism   . GERD (gastroesophageal reflux disease)     tx. Tums  . H/O hiatal hernia     hx. esophageal dilation x1  . Vitamin D deficiency   . Anemia     during pregnancy  . DVT (deep venous thrombosis) ([place]) 10/29/2015    LLE  . Bilateral pulmonary embolism ([place]) 10/29/2015  . Childhood asthma   . Sleep apnea     "CPAP ordered; insurance wouldn't cover" (10/29/2015)  . History of esophageal dilatation   . Arthritis     "all over"  . Chronic back pain   . Uterine cancer ([place])     uterine cancer  . Varicose veins    Assessment:  60 y/o F s/p surgery for ruptured appendicitis. Pharmacy consulted to start heparin gtt with no bolus for pulmonary embolism.  CBC stable.   Goal of Therapy:  Heparin level 0.3-0.7 units/ml Monitor platelets by anticoagulation protocol: Yes   Plan:  Heparin gtt 1300 units/hr 6hr HL Daily HL, CBC Monitor for S&S of bleed  Angela Burke, PharmD Pharmacy Resident Pager: [phone] 02/08/2016,12:59 PM

## 2016-02-08 NOTE — Progress Notes (Signed)
1 Day Post-Op  Subjective: Comfortable.  States she had a localized reaction to a Morphine injection.  States the Morphine syringe looked different than the previous syringes.  Objective: Vital signs in last 24 hours: Temp:  [97.3 F (36.3 C)-98.3 F (36.8 C)] 97.6 F (36.4 C) (05/27 0734) Pulse Rate:  [71-100] 71 (05/27 0734) Resp:  [12-26] 20 (05/27 0734) BP: (119-153)/(71-93) 119/83 mmHg (05/27 0734) SpO2:  [88 %-98 %] 95 % (05/27 0734)    Intake/Output from previous day: 05/26 0701 - 05/27 0700 In: 6335.8 [P.O.:1020; I.V.:5015.8; IV Piggyback:300] Out: 575 [Urine:300; Drains:245; Blood:30] Intake/Output this shift: Total I/O In: 416.7 [P.O.:360; I.V.:56.7] Out: 300 [Urine:300]  PE: General- In NAD Abdomen-soft, incisions clean, serous drain output  Lab Results:   Recent Labs  02/07/16 0001 02/08/16 0520  WBC 21.8* 16.3*  HGB 10.6* 10.3*  HCT 33.2* 33.3*  PLT 353 348   BMET  Recent Labs  02/07/16 0001 02/08/16 0520  NA 135 134*  K 3.7 4.7  CL 102 104  CO2 20* 21*  GLUCOSE 99 146*  BUN 27* 22*  CREATININE 1.43* 1.07*  CALCIUM 8.2* 8.2*   PT/INR  Recent Labs  02/07/16 0001  LABPROT 21.2*  INR 1.84*   Comprehensive Metabolic Panel:    Component Value Date/Time   NA 134* 02/08/2016 0520   NA 135 02/07/2016 0001   K 4.7 02/08/2016 0520   K 3.7 02/07/2016 0001   CL 104 02/08/2016 0520   CL 102 02/07/2016 0001   CO2 21* 02/08/2016 0520   CO2 20* 02/07/2016 0001   BUN 22* 02/08/2016 0520   BUN 27* 02/07/2016 0001   CREATININE 1.07* 02/08/2016 0520   CREATININE 1.43* 02/07/2016 0001   GLUCOSE 146* 02/08/2016 0520   GLUCOSE 99 02/07/2016 0001   CALCIUM 8.2* 02/08/2016 0520   CALCIUM 8.2* 02/07/2016 0001   AST 27 02/06/2016 1223   AST 18 10/29/2015 1033   ALT 36 02/06/2016 1223   ALT 15 10/29/2015 1033   ALKPHOS 105 02/06/2016 1223   ALKPHOS 63 10/29/2015 1033   BILITOT 0.5 02/06/2016 1223   BILITOT 0.6 10/29/2015 1033   PROT 7.6  02/06/2016 1223   PROT 7.2 10/29/2015 1033   ALBUMIN 2.9* 02/06/2016 1223   ALBUMIN 3.8 10/29/2015 1033     Studies/Results: Ct Abdomen Pelvis W Contrast  02/06/2016  CLINICAL DATA:  Left lower quadrant abdominal pain for 10 days with some constipation, diarrhea and vomiting. Leukocytosis. Initial encounter. EXAM: CT ABDOMEN AND PELVIS WITH CONTRAST TECHNIQUE: Multidetector CT imaging of the abdomen and pelvis was performed using the standard protocol following bolus administration of intravenous contrast. CONTRAST:  126mL ISOVUE-300 IOPAMIDOL (ISOVUE-300) INJECTION 61% COMPARISON:  Limited correlation made with chest CT 10/29/2015. Acute abdominal series done today. FINDINGS: Lower chest: There is a calcified right lower lobe granuloma and calcified right hilar lymph nodes. The lungs are otherwise clear. There is a small to moderate hiatal hernia and mild cardiomegaly. Hepatobiliary: The liver is normal in density without focal abnormality. No evidence of gallstones, gallbladder wall thickening or biliary dilatation. Pancreas: Unremarkable. No pancreatic ductal dilatation or surrounding inflammatory changes. Spleen: Normal in size without focal abnormality. Adrenals/Urinary Tract: Both adrenal glands appear normal. Both kidneys appear normal. There is no evidence of urinary tract calculus or hydronephrosis. The bladder is incompletely distended, although demonstrates probable mild wall thickening. Stomach/Bowel: As above, there is a small to moderate hiatal hernia. The stomach and small bowel demonstrate no significant findings. There is a small  amount of fluid throughout the small bowel which is not significantly dilated. There is a right lower quadrant inflammatory process inferior to the cecum within extraluminal fluid collection measuring 4.5 x 2.2 cm on image 81. The appendix is not clearly visualized. There is a calcification on image 71 which could reflect an at appendicolith. There are inflammatory  changes throughout the right lower quadrant. The colon is decompressed. There is no focal colonic wall thickening or pericolonic inflammation. Vascular/Lymphatic: There are no enlarged abdominal or pelvic lymph nodes. Mild aortic and branch vessel atherosclerosis. Reproductive: Hysterectomy. No evidence of adnexal mass. There is some free pelvic fluid. Other: Tiny umbilical hernia containing only fat. 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 Musculoskeletal: No acute or significant osseous findings. Mild lumbar spondylosis. IMPRESSION: 1. Extensive right lower quadrant inflammatory process with diffuse inflammatory changes and an extraluminal fluid collection adjacent to the cecal tip, most consistent with perforated appendicitis. 2. No evidence of diverticulitis or bowel obstruction. 3. Small abdominal wall hernias containing only fat. 4. These results were called by telephone at the time of interpretation on 02/06/2016 at 4:22 pm to Dr. Lonia Skinner, who verbally acknowledged these results. Electronically Signed   By: Richardean Sale M.D.   On: 02/06/2016 16:23   Dg Abd Acute W/chest  02/06/2016  CLINICAL DATA:  Shakes, fever, chills, decreased appetite, abdominal cramps, nausea and diarrhea. Symptoms onset 2 days ago. EXAM: DG ABDOMEN ACUTE W/ 1V CHEST COMPARISON:  Chest x-ray dated 09/29/2005. FINDINGS: Single view of the chest: Heart size is normal. Overall cardiomediastinal silhouette is stable in size and configuration. Benign calcified granuloma noted at the right lung base. Lungs otherwise clear. No evidence of pneumonia. No pleural effusion or pneumothorax seen. Osseous structures about the chest are unremarkable. Supine and upright views of the abdomen: Mildly distended gas-filled loops of small bowel within the central abdomen and left lower quadrant, with associated air-fluid levels. No evidence of soft tissue mass or free intraperitoneal air. Degenerative changes noted within  the thoracolumbar spine. No acute - appearing osseous abnormality. IMPRESSION: 1. Mildly distended gas-filled loops of small bowel within the central abdomen and left lower quadrant, with associated air-fluid levels. Findings suggest either partial small bowel obstruction or ileus. CT abdomen/pelvis may be helpful for further characterization. 2. Heart size is normal. No evidence of acute cardiopulmonary abnormality. Electronically Signed   By: Franki Cabot M.D.   On: 02/06/2016 14:09    Anti-infectives: Anti-infectives    Start     Dose/Rate Route Frequency Ordered Stop   02/07/16 2300  imipenem-cilastatin (PRIMAXIN) 500 mg in sodium chloride 0.9 % 100 mL IVPB  Status:  Discontinued     500 mg 200 mL/hr over 30 Minutes Intravenous Every 6 hours 02/06/16 2157 02/07/16 0832   02/07/16 1800  imipenem-cilastatin (PRIMAXIN) 500 mg in sodium chloride 0.9 % 100 mL IVPB     500 mg 200 mL/hr over 30 Minutes Intravenous Every 8 hours 02/07/16 1036     02/07/16 1000  imipenem-cilastatin (PRIMAXIN) 500 mg in sodium chloride 0.9 % 100 mL IVPB  Status:  Discontinued     500 mg 200 mL/hr over 30 Minutes Intravenous Every 6 hours 02/07/16 0832 02/07/16 1036   02/06/16 1645  ceFEPIme (MAXIPIME) 2 g in dextrose 5 % 50 mL IVPB     2 g 100 mL/hr over 30 Minutes Intravenous  Once 02/06/16 1641 02/06/16 1740   02/06/16 1645  metroNIDAZOLE (FLAGYL) IVPB 500 mg  500 mg 100 mL/hr over 60 Minutes Intravenous  Once 02/06/16 1641 02/06/16 1823      Assessment Principal Problem:   Ruptured appendicitis s/p laparoscopic appendectomy Active Problems:   Pulmonary embolism, bilateral ([place])      Dehydration with AKI-improved.    Allergic reaction to Morphine-verified with RN       LOS: 2 days   Plan: Oral analgesic and Dilaudid if needed.  Full liquids.  Start IV Heparin-no boluses.   Erin Vasquez Lenna Sciara 02/08/2016

## 2016-02-08 NOTE — Progress Notes (Signed)
Pt given morphine IV at 1000. Less than 20 seconds passed and patient complained of itching all over. Pt given benadryl elixir and got relief from itching. Will continue to monitor.

## 2016-02-08 NOTE — Progress Notes (Signed)
ANTICOAGULATION CONSULT NOTE - Initial Consult  Pharmacy Consult for Heparin Indication: pulmonary embolus  Allergies  Allergen Reactions  . Bee Venom Swelling and Other (See Comments)    Patient is allergic to bee venom, mosquitos, wasps, yellow jackets and bees. Wherever she gets stung that area swells, turns hot and has fevers.    . Other Anaphylaxis and Shortness Of Breath    [place] smoke  . Food Other (See Comments)    Tangerines. Unknown childhood allergy.   . Morphine And Related Itching  . Penicillins Other (See Comments)    UNKNOWN: CHILDHOOD ALLERGY     Patient Measurements: Height: 5\' 3"  (160 cm) Weight: 286 lb 13.1 oz (130.1 kg) IBW/kg (Calculated) : 52.4 Heparin Dosing Weight: 85 kg  Vital Signs: Temp: 97.9 F (36.6 C) (05/27 1938) Temp Source: Oral (05/27 1938) BP: 138/84 mmHg (05/27 1938) Pulse Rate: 90 (05/27 1938)  Labs:  Recent Labs  02/06/16 1223 02/07/16 0001 02/08/16 0520 02/08/16 1943  HGB 12.8 10.6* 10.3*  --   HCT 38.3 33.2* 33.3*  --   PLT 380 353 348  --   LABPROT  --  21.2*  --   --   INR  --  1.84*  --   --   HEPARINUNFRC  --   --   --  <0.10*  CREATININE 0.38* 1.43* 1.07*  --     Estimated Creatinine Clearance: 73.7 mL/min (by C-G formula based on Cr of 1.07).   Medical History: Past Medical History  Diagnosis Date  . Thyroid disease   . Hypothyroidism   . GERD (gastroesophageal reflux disease)     tx. Tums  . H/O hiatal hernia     hx. esophageal dilation x1  . Vitamin D deficiency   . Anemia     during pregnancy  . DVT (deep venous thrombosis) ([place]) 10/29/2015    LLE  . Bilateral pulmonary embolism ([place]) 10/29/2015  . Childhood asthma   . Sleep apnea     "CPAP ordered; insurance wouldn't cover" (10/29/2015)  . History of esophageal dilatation   . Arthritis     "all over"  . Chronic back pain   . Uterine cancer ([place])     uterine cancer  . Varicose veins    Assessment:  60 y/o F s/p surgery for ruptured  appendicitis. On heparin for history of bilateral PE, LLE DVT in setting of hypercoaguable state. Pt is on rivaroxaban PTA, last dose was 5/24. Heparin level is SUBtherapeutic. CBC wnl.  Goal of Therapy:  Heparin level 0.3-0.7 units/ml Monitor platelets by anticoagulation protocol: Yes   Plan:  Heparin gtt 1550 units/hr Daily HL, CBC Monitor for S&S of bleed    Hughes Better, PharmD, BCPS Clinical Pharmacist 02/08/2016 8:16 PM

## 2016-02-08 NOTE — Progress Notes (Signed)
PROGRESS NOTE  Erin Vasquez [id number] DOB: [birth year] DOA: 02/06/2016 PCP: Barrie Lyme, FNP Brief History:  60 year old female with a history of DVT/PE and underlying lupus anticoagulant, hypothyroidism, GERD with Schatzki's ring presented with 10 day history of abdominal pain, fevers, chills. The patient was diagnosed with bilateral pulmonary emboli on 10/29/2015 with DVT of the left lower extremity during that time. The patient was started on rivaroxaban. Hematologic evaluation revealed lupus anticoagulant. She has been compliant with the medications. Because of her worsening abdominal pain she presented to emergency Department. CT of the abdomen and pelvis revealed right lower quadrant diffuse inflammation with extraluminal fluid at the cecal tip consistent with perforated appendicitis. The patient was initially started on cefepime and metronidazole. This was switched to imipenem. Initial WBC 23.3 with lactic acid 1.7. The patient was admitted under the surgical team, and internal medicine consultation was obtained  Assessment/Plan: Perforated appendicitis -02/07/2016--lap appendectomy--Dr. Ninfa Linden -Remain off rivaroxaban -Continue imipenem for now -Continue intravenous fluids -Lactic acid 1.7, procalcitonin 1.88-->0.83 -Pain control  -Follow blood cultures--neg to date -po abx when tolerates diet  Bilateral PE/left lower extremity DVT--hypercoagulable state -Diagnosed 10/29/2015 -Further workup revealed lupus anticoagulant -Last dose rivaroxaban 02/05/2016 at 1930 -heparin drip for now  -transition back to po rivaroxaban when cleared by surgery -diet advancement per general surgery  Acute kidney injury -Likely secondary to hemodynamic changes and infectious process -Serum creatinine increased from 0.38--> 1.43 -The patient did receive intravenous dye on 02/06/2016 -improving  Hyponatremia -Secondary to volume depletion/dehydration -Continue  intravenous fluids -Improving with fluids  Hypothyroidism -Continue present dose of levothyroxine  GERD -Continue ranitidine -November 2014 EGD revealed Schatzki's ring which was dilated   Disposition Plan: Not stable for d/c--transfer to floor when ok with surgery Family Communication: Mother updated at beside 5/27  Code Status: FULL Subjective: Patient complains of abdominal pain but denies any nausea, vomiting, diarrhea. Not passing flatus nor having bowel movement. Denies any fevers, chills, headache, neck pain, chest pain, short of breath.  Objective: Filed Vitals:   02/08/16 0000 02/08/16 0100 02/08/16 0400 02/08/16 0734  BP: 134/79 153/93 129/74 119/83  Pulse: 79 87 71 71  Temp:   98.3 F (36.8 C) 97.6 F (36.4 C)  TempSrc:   Oral Oral  Resp: 19 18 15 20   Height:      Weight:      SpO2: 95% 94% 94% 95%    Intake/Output Summary (Last 24 hours) at 02/08/16 1243 Last data filed at 02/08/16 0947  Gross per 24 hour  Intake 5780.85 ml  Output    875 ml  Net 4905.85 ml   Weight change:  Exam:   General:  Pt is alert, follows commands appropriately, not in acute distress  HEENT: No icterus, No thrush, No neck mass, [place]/AT  Cardiovascular: RRR, S1/S2, no rubs, no gallops  Respiratory: Bibasilar crackles without wheezing.  Abdomen: Soft/+BS, Diffusely tender without any rebound, non distended, no guarding  Extremities: No edema, No lymphangitis, No petechiae, No rashes, no synovitis   Data Reviewed: I have personally reviewed following labs and imaging studies Basic Metabolic Panel:  Recent Labs Lab 02/06/16 1223 02/07/16 0001 02/08/16 0520  NA 133* 135 134*  K 4.1 3.7 4.7  CL 100* 102 104  CO2 19* 20* 21*  GLUCOSE 110* 99 146*  BUN 24* 27* 22*  CREATININE 0.38* 1.43* 1.07*  CALCIUM 9.0 8.2* 8.2*   Liver Function Tests:  Recent Labs Lab 02/06/16  1223  AST 27  ALT 36  ALKPHOS 105  BILITOT 0.5  PROT 7.6  ALBUMIN 2.9*   No results  for input(s): LIPASE, AMYLASE in the last 168 hours. No results for input(s): AMMONIA in the last 168 hours. Coagulation Profile:  Recent Labs Lab 02/07/16 0001  INR 1.84*   CBC:  Recent Labs Lab 02/06/16 1223 02/07/16 0001 02/08/16 0520  WBC 23.3* 21.8* 16.3*  NEUTROABS 21.9*  --   --   HGB 12.8 10.6* 10.3*  HCT 38.3 33.2* 33.3*  MCV 86.7 87.6 89.0  PLT 380 353 348   Cardiac Enzymes: No results for input(s): CKTOTAL, CKMB, CKMBINDEX, TROPONINI in the last 168 hours. BNP: Invalid input(s): POCBNP CBG: No results for input(s): GLUCAP in the last 168 hours. HbA1C: No results for input(s): HGBA1C in the last 72 hours. Urine analysis:    Component Value Date/Time   COLORURINE AMBER* 02/06/2016 1650   APPEARANCEUR HAZY* 02/06/2016 1650   LABSPEC 1.010 02/06/2016 1650   PHURINE 5.5 02/06/2016 1650   GLUCOSEU NEGATIVE 02/06/2016 1650   HGBUR LARGE* 02/06/2016 1650   BILIRUBINUR MODERATE* 02/06/2016 1650   KETONESUR 15* 02/06/2016 1650   PROTEINUR 30* 02/06/2016 1650   NITRITE NEGATIVE 02/06/2016 1650   LEUKOCYTESUR NEGATIVE 02/06/2016 1650   Sepsis Labs: @LABRCNTIP (procalcitonin:4,lacticidven:4) ) Recent Results (from the past 240 hour(s))  Culture, blood (routine x 2)     Status: None (Preliminary result)   Collection Time: 02/06/16  8:39 PM  Result Value Ref Range Status   Specimen Description BLOOD LEFT HAND  Final   Special Requests BOTTLES DRAWN AEROBIC ONLY 4CC  Final   Culture NO GROWTH 2 DAYS  Final   Report Status PENDING  Incomplete  Culture, blood (routine x 2)     Status: None (Preliminary result)   Collection Time: 02/06/16  8:47 PM  Result Value Ref Range Status   Specimen Description BLOOD LEFT HAND  Final   Special Requests BOTTLES DRAWN AEROBIC ONLY 4CC  Final   Culture NO GROWTH 2 DAYS  Final   Report Status PENDING  Incomplete  MRSA PCR Screening     Status: None   Collection Time: 02/06/16 10:39 PM  Result Value Ref Range Status   MRSA  by PCR NEGATIVE NEGATIVE Final    Comment:        The GeneXpert MRSA Assay (FDA approved for NASAL specimens only), is one component of a comprehensive MRSA colonization surveillance program. It is not intended to diagnose MRSA infection nor to guide or monitor treatment for MRSA infections.      Scheduled Meds: . imipenem-cilastatin  500 mg Intravenous Q8H  . levothyroxine  50 mcg Oral QAC breakfast  . pantoprazole (PROTONIX) IV  40 mg Intravenous QHS   Continuous Infusions: . dextrose 5 % and 0.45 % NaCl with KCl 20 mEq/L 100 mL/hr at 02/08/16 0947  . lactated ringers Stopped (02/07/16 1931)    Procedures/Studies: Ct Abdomen Pelvis W Contrast  02/06/2016  CLINICAL DATA:  Left lower quadrant abdominal pain for 10 days with some constipation, diarrhea and vomiting. Leukocytosis. Initial encounter. EXAM: CT ABDOMEN AND PELVIS WITH CONTRAST TECHNIQUE: Multidetector CT imaging of the abdomen and pelvis was performed using the standard protocol following bolus administration of intravenous contrast. CONTRAST:  174mL ISOVUE-300 IOPAMIDOL (ISOVUE-300) INJECTION 61% COMPARISON:  Limited correlation made with chest CT 10/29/2015. Acute abdominal series done today. FINDINGS: Lower chest: There is a calcified right lower lobe granuloma and calcified right hilar lymph nodes. The  lungs are otherwise clear. There is a small to moderate hiatal hernia and mild cardiomegaly. Hepatobiliary: The liver is normal in density without focal abnormality. No evidence of gallstones, gallbladder wall thickening or biliary dilatation. Pancreas: Unremarkable. No pancreatic ductal dilatation or surrounding inflammatory changes. Spleen: Normal in size without focal abnormality. Adrenals/Urinary Tract: Both adrenal glands appear normal. Both kidneys appear normal. There is no evidence of urinary tract calculus or hydronephrosis. The bladder is incompletely distended, although demonstrates probable mild wall thickening.  Stomach/Bowel: As above, there is a small to moderate hiatal hernia. The stomach and small bowel demonstrate no significant findings. There is a small amount of fluid throughout the small bowel which is not significantly dilated. There is a right lower quadrant inflammatory process inferior to the cecum within extraluminal fluid collection measuring 4.5 x 2.2 cm on image 81. The appendix is not clearly visualized. There is a calcification on image 71 which could reflect an at appendicolith. There are inflammatory changes throughout the right lower quadrant. The colon is decompressed. There is no focal colonic wall thickening or pericolonic inflammation. Vascular/Lymphatic: There are no enlarged abdominal or pelvic lymph nodes. Mild aortic and branch vessel atherosclerosis. Reproductive: Hysterectomy. No evidence of adnexal mass. There is some free pelvic fluid. Other: Tiny umbilical hernia containing only fat. 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 Musculoskeletal: No acute or significant osseous findings. Mild lumbar spondylosis. IMPRESSION: 1. Extensive right lower quadrant inflammatory process with diffuse inflammatory changes and an extraluminal fluid collection adjacent to the cecal tip, most consistent with perforated appendicitis. 2. No evidence of diverticulitis or bowel obstruction. 3. Small abdominal wall hernias containing only fat. 4. These results were called by telephone at the time of interpretation on 02/06/2016 at 4:22 pm to Dr. Lonia Skinner, who verbally acknowledged these results. Electronically Signed   By: Richardean Sale M.D.   On: 02/06/2016 16:23   Dg Abd Acute W/chest  02/06/2016  CLINICAL DATA:  Shakes, fever, chills, decreased appetite, abdominal cramps, nausea and diarrhea. Symptoms onset 2 days ago. EXAM: DG ABDOMEN ACUTE W/ 1V CHEST COMPARISON:  Chest x-ray dated 09/29/2005. FINDINGS: Single view of the chest: Heart size is normal. Overall cardiomediastinal  silhouette is stable in size and configuration. Benign calcified granuloma noted at the right lung base. Lungs otherwise clear. No evidence of pneumonia. No pleural effusion or pneumothorax seen. Osseous structures about the chest are unremarkable. Supine and upright views of the abdomen: Mildly distended gas-filled loops of small bowel within the central abdomen and left lower quadrant, with associated air-fluid levels. No evidence of soft tissue mass or free intraperitoneal air. Degenerative changes noted within the thoracolumbar spine. No acute - appearing osseous abnormality. IMPRESSION: 1. Mildly distended gas-filled loops of small bowel within the central abdomen and left lower quadrant, with associated air-fluid levels. Findings suggest either partial small bowel obstruction or ileus. CT abdomen/pelvis may be helpful for further characterization. 2. Heart size is normal. No evidence of acute cardiopulmonary abnormality. Electronically Signed   By: Franki Cabot M.D.   On: 02/06/2016 14:09    Debbie Bellucci, DO  [facility] Pager [phone]  If 7PM-7AM, please contact night-coverage [email or web address] Password [email or web address] 02/08/2016, 12:43 PM   LOS: 2 days

## 2016-02-09 LAB — CBC
HCT: 34.1 % — ABNORMAL LOW (ref 36.0–46.0)
Hemoglobin: 10.5 g/dL — ABNORMAL LOW (ref 12.0–15.0)
MCH: 27.8 pg (ref 26.0–34.0)
MCHC: 30.8 g/dL (ref 30.0–36.0)
MCV: 90.2 fL (ref 78.0–100.0)
PLATELETS: 347 10*3/uL (ref 150–400)
RBC: 3.78 MIL/uL — AB (ref 3.87–5.11)
RDW: 14.8 % (ref 11.5–15.5)
WBC: 14.7 10*3/uL — ABNORMAL HIGH (ref 4.0–10.5)

## 2016-02-09 LAB — HEPARIN LEVEL (UNFRACTIONATED)
Heparin Unfractionated: 0.13 [IU]/mL — ABNORMAL LOW (ref 0.30–0.70)
Heparin Unfractionated: 0.24 [IU]/mL — ABNORMAL LOW (ref 0.30–0.70)
Heparin Unfractionated: 0.4 IU/mL (ref 0.30–0.70)

## 2016-02-09 LAB — BASIC METABOLIC PANEL WITH GFR
Anion gap: 7 (ref 5–15)
BUN: 16 mg/dL (ref 6–20)
CO2: 23 mmol/L (ref 22–32)
Calcium: 8.4 mg/dL — ABNORMAL LOW (ref 8.9–10.3)
Chloride: 106 mmol/L (ref 101–111)
Creatinine, Ser: 1.01 mg/dL — ABNORMAL HIGH (ref 0.44–1.00)
GFR calc Af Amer: >60 mL/min
GFR calc non Af Amer: 59 mL/min — ABNORMAL LOW
Glucose, Bld: 99 mg/dL (ref 65–99)
Potassium: 4.8 mmol/L (ref 3.5–5.1)
Sodium: 136 mmol/L (ref 135–145)

## 2016-02-09 MED ORDER — SODIUM CHLORIDE 0.9 % IV SOLN
INTRAVENOUS | Status: DC
  Filled 2016-02-09: qty 1000

## 2016-02-09 MED ORDER — HEPARIN BOLUS VIA INFUSION
2500.0000 [IU] | Freq: Once | INTRAVENOUS | Status: AC
  Administered 2016-02-09: 2500 [IU] via INTRAVENOUS
  Filled 2016-02-09: qty 2500

## 2016-02-09 MED ORDER — DEXTROSE-NACL 5-0.45 % IV SOLN
INTRAVENOUS | Status: DC
  Administered 2016-02-09 – 2016-02-10 (×2): via INTRAVENOUS

## 2016-02-09 MED ORDER — DEXTROSE-NACL 5-0.45 % IV SOLN
INTRAVENOUS | Status: DC
  Filled 2016-02-09 (×2): qty 1000

## 2016-02-09 MED ORDER — SODIUM CHLORIDE 0.9 % IV SOLN
500.0000 mg | Freq: Four times a day (QID) | INTRAVENOUS | Status: DC
  Administered 2016-02-09 – 2016-02-12 (×12): 500 mg via INTRAVENOUS
  Filled 2016-02-09 (×15): qty 500

## 2016-02-09 NOTE — Progress Notes (Signed)
2 Days Post-Op  Subjective: Started to have flatus this am but having some burping too; no more allergic issues to pain meds. Didn't get out of bed. Some LLQ pain  Objective: Vital signs in last 24 hours: Temp:  [97.9 F (36.6 C)-98.4 F (36.9 C)] 97.9 F (36.6 C) (05/28 0329) Pulse Rate:  [84-98] 95 (05/28 0655) Resp:  [17-22] 18 (05/28 0655) BP: (100-143)/(63-98) 121/88 mmHg (05/28 0655) SpO2:  [94 %-99 %] 96 % (05/28 0655)    Intake/Output from previous day: 05/27 0701 - 05/28 0700 In: 3618.5 [P.O.:840; I.V.:2478.5; IV Piggyback:300] Out: 2000 [id number]; Drains:50] Intake/Output this shift: Total I/O In: 805 [P.O.:480; I.V.:300; Other:25] Out: 450 [Urine:450]  Alert, mom at bs cta  Reg Soft, obese, drain -serous, incisions c/d/i No edema  Lab Results:   Recent Labs  02/08/16 0520 02/09/16 0542  WBC 16.3* 14.7*  HGB 10.3* 10.5*  HCT 33.3* 34.1*  PLT 348 347   BMET  Recent Labs  02/08/16 0520 02/09/16 0542  NA 134* 136  K 4.7 4.8  CL 104 106  CO2 21* 23  GLUCOSE 146* 99  BUN 22* 16  CREATININE 1.07* 1.01*  CALCIUM 8.2* 8.4*   PT/INR  Recent Labs  02/07/16 0001  LABPROT 21.2*  INR 1.84*   ABG No results for input(s): PHART, HCO3 in the last 72 hours.  Invalid input(s): PCO2, PO2  Studies/Results: No results found.  Anti-infectives: Anti-infectives    Start     Dose/Rate Route Frequency Ordered Stop   02/09/16 1200  imipenem-cilastatin (PRIMAXIN) 500 mg in sodium chloride 0.9 % 100 mL IVPB     500 mg 200 mL/hr over 30 Minutes Intravenous Every 6 hours 02/09/16 0746     02/07/16 2300  imipenem-cilastatin (PRIMAXIN) 500 mg in sodium chloride 0.9 % 100 mL IVPB  Status:  Discontinued     500 mg 200 mL/hr over 30 Minutes Intravenous Every 6 hours 02/06/16 2157 02/07/16 0832   02/07/16 1800  imipenem-cilastatin (PRIMAXIN) 500 mg in sodium chloride 0.9 % 100 mL IVPB  Status:  Discontinued     500 mg 200 mL/hr over 30 Minutes Intravenous  Every 8 hours 02/07/16 1036 02/09/16 0746   02/07/16 1000  imipenem-cilastatin (PRIMAXIN) 500 mg in sodium chloride 0.9 % 100 mL IVPB  Status:  Discontinued     500 mg 200 mL/hr over 30 Minutes Intravenous Every 6 hours 02/07/16 0832 02/07/16 1036   02/06/16 1645  ceFEPIme (MAXIPIME) 2 g in dextrose 5 % 50 mL IVPB     2 g 100 mL/hr over 30 Minutes Intravenous  Once 02/06/16 1641 02/06/16 1740   02/06/16 1645  metroNIDAZOLE (FLAGYL) IVPB 500 mg     500 mg 100 mL/hr over 60 Minutes Intravenous  Once 02/06/16 1641 02/06/16 1823      Assessment/Plan: s/p Procedure(s): LAPAROSCOPIC APPENDECTOMY (N/A)  Principal Problem:  Ruptured appendicitis s/p laparoscopic appendectomy - cont IV abx for now Active Problems:  Pulmonary embolism, bilateral ([facility]) - cont hep gtt per pharm, not ready for oral anticoag yet    Dehydration with AKI-improved. Cr down to 1; uop ok, decrease IVF   Allergic reaction to Morphine-verified with RN  Cont pulm toilet, OOB, AMBULATE!  tx to floor  Leighton Ruff. Redmond Pulling, MD, FACS General, Bariatric, & Minimally Invasive Surgery [facility], [place]   LOS: 3 days    Gayland Curry 02/09/2016

## 2016-02-09 NOTE — Progress Notes (Signed)
Pt refusing CPAP at this time. I told her to call if she changes her mind

## 2016-02-09 NOTE — Progress Notes (Signed)
[place] for Heparin Indication: pulmonary embolus  Allergies  Allergen Reactions  . Bee Venom Swelling and Other (See Comments)    Patient is allergic to bee venom, mosquitos, wasps, yellow jackets and bees. Wherever she gets stung that area swells, turns hot and has fevers.    . Other Anaphylaxis and Shortness Of Breath    Hickory smoke  . Food Other (See Comments)    Tangerines. Unknown childhood allergy.   . Morphine And Related Itching  . Penicillins Other (See Comments)    UNKNOWN: CHILDHOOD ALLERGY     Patient Measurements: Height: 5\' 3"  (160 cm) Weight: 286 lb 13.1 oz (130.1 kg) IBW/kg (Calculated) : 52.4 Heparin Dosing Weight: 85 kg  Vital Signs: Temp: 97.9 F (36.6 C) (05/28 0329) Temp Source: Oral (05/28 0329) BP: 143/98 mmHg (05/28 0329) Pulse Rate: 89 (05/28 0329)  Labs:  Recent Labs  02/07/16 0001 02/08/16 0520 02/08/16 1943 02/09/16 0542  HGB 10.6* 10.3*  --  10.5*  HCT 33.2* 33.3*  --  34.1*  PLT 353 348  --  347  LABPROT 21.2*  --   --   --   INR 1.84*  --   --   --   HEPARINUNFRC  --   --  <0.10* 0.13*  CREATININE 1.43* 1.07*  --  1.01*    Estimated Creatinine Clearance: 78.1 mL/min (by C-G formula based on Cr of 1.01).   Medical History: Past Medical History  Diagnosis Date  . Thyroid disease   . Hypothyroidism   . GERD (gastroesophageal reflux disease)     tx. Tums  . H/O hiatal hernia     hx. esophageal dilation x1  . Vitamin D deficiency   . Anemia     during pregnancy  . DVT (deep venous thrombosis) ([place]) 10/29/2015    LLE  . Bilateral pulmonary embolism ([place]) 10/29/2015  . Childhood asthma   . Sleep apnea     "CPAP ordered; insurance wouldn't cover" (10/29/2015)  . History of esophageal dilatation   . Arthritis     "all over"  . Chronic back pain   . Uterine cancer ([place])     uterine cancer  . Varicose veins    Assessment:  60 y/o F s/p surgery for ruptured appendicitis. On heparin  for history of bilateral PE, LLE DVT in setting of hypercoaguable state. Pt is on rivaroxaban PTA, last dose was 5/24. Heparin level is SUBtherapeutic. CBC wnl.  This morning's HL remains subtherapeutic at 0.13 on heparin 1550 units/hr. Nurse reports no issues with infusion or bleeding.   Goal of Therapy:  Heparin level 0.3-0.7 units/ml Monitor platelets by anticoagulation protocol: Yes   Plan:  Bolus heparin 2500 units and increase rate to 1850 units/hr 6h HL Daily HL, CBC Monitor for S&S of bleed  Andrey Cota. Diona Foley, PharmD, [place] Clinical Pharmacist Pager [phone]  02/09/2016 6:33 AM

## 2016-02-09 NOTE — Progress Notes (Signed)
PROGRESS NOTE  Erin Vasquez [id number] DOB: 05-Feb-1956 DOA: 02/06/2016 PCP: Barrie Lyme, FNP Brief History:  60 year old female with a history of DVT/PE and underlying lupus anticoagulant, hypothyroidism, GERD with Schatzki's ring presented with 10 day history of abdominal pain, fevers, chills. The patient was diagnosed with bilateral pulmonary emboli on 10/29/2015 with DVT of the left lower extremity during that time. The patient was started on rivaroxaban. Hematologic evaluation revealed lupus anticoagulant. She has been compliant with the medications. Because of her worsening abdominal pain she presented to emergency Department. CT of the abdomen and pelvis revealed right lower quadrant diffuse inflammation with extraluminal fluid at the cecal tip consistent with perforated appendicitis. The patient was initially started on cefepime and metronidazole. This was switched to imipenem. Initial WBC 23.3 with lactic acid 1.7. The patient was admitted under the surgical team, and internal medicine consultation was obtained for medical management. The patient was taken to surgery for laparoscopic appendectomy on 02/07/2016.  Postoperatively, the patient was placed on intravenous heparin.  Assessment/Plan: Perforated appendicitis -02/07/2016--lap appendectomy--Dr. Ninfa Linden -Continue imipenem for now D#3 -Continue intravenous fluids-->remove K from IVF as K is trending up -Lactic acid 1.7, procalcitonin 1.88-->0.83 -Pain control  -Follow blood cultures--neg to date -increase activity-->ambulate hall -po abx when surgery feels appropriate  Bilateral PE/left lower extremity DVT--hypercoagulable state -Diagnosed 10/29/2015 -Further workup revealed lupus anticoagulant -Last dose rivaroxaban 02/05/2016 at 1930 -heparin drip for now  -transition back to po rivaroxaban when cleared by surgery -diet advancement per general surgery  Acute kidney injury -Likely secondary to  hemodynamic changes and infectious process -Serum creatinine increased from 0.38--> 1.43-->1.01 -The patient did receive intravenous dye on 02/06/2016 -improving  Hyponatremia -Secondary to volume depletion/dehydration -Continue intravenous fluids -Improving with fluids  Hypothyroidism -Continue present dose of levothyroxine  GERD -Continue ranitidine -November 2014 EGD revealed Schatzki's ring which was dilated   Disposition Plan: transfer to [facility] Family Communication: Mother updated at beside 5/28   Subjective: Patient having intermittent abdominal pain. She is passing flatus. No bowel movement yet. Denies any fevers, chills, chest pain, nausea, vomiting, diarrhea. Denies headache or neck pain. Has some dyspnea on exertion.  Objective: Filed Vitals:   02/09/16 0655 02/09/16 1009 02/09/16 1134 02/09/16 1203  BP: 121/88 134/95 154/90 144/75  Pulse: 95 88 101 90  Temp:   97.6 F (36.4 C) 97.9 F (36.6 C)  TempSrc:   Oral Oral  Resp: 18 12 24 20   Height:      Weight:      SpO2: 96% 95% 84% 96%    Intake/Output Summary (Last 24 hours) at 02/09/16 1349 Last data filed at 02/09/16 1300  Gross per 24 hour  Intake 4640.17 ml  Output   3080 ml  Net 1560.17 ml   Weight change:  Exam:   General:  Pt is alert, follows commands appropriately, not in acute distress  HEENT: No icterus, No thrush, No neck mass, [place]/AT  Cardiovascular: RRR, S1/S2, no rubs, no gallops  Respiratory: Bibasilar crackles. No wheezing  Abdomen: Soft/+BS, LLQ tender without rebound, non distended, no guarding  Extremities: 1+ edema, No lymphangitis, No petechiae, No rashes, no synovitis   Data Reviewed: I have personally reviewed following labs and imaging studies Basic Metabolic Panel:  Recent Labs Lab 02/06/16 1223 02/07/16 0001 02/08/16 0520 02/09/16 0542  NA 133* 135 134* 136  K 4.1 3.7 4.7 4.8  CL 100* 102 104 106  CO2 19* 20* 21* 23  GLUCOSE 110* 99 146* 99  BUN 24*  27* 22* 16  CREATININE 0.38* 1.43* 1.07* 1.01*  CALCIUM 9.0 8.2* 8.2* 8.4*   Liver Function Tests:  Recent Labs Lab 02/06/16 1223  AST 27  ALT 36  ALKPHOS 105  BILITOT 0.5  PROT 7.6  ALBUMIN 2.9*   No results for input(s): LIPASE, AMYLASE in the last 168 hours. No results for input(s): AMMONIA in the last 168 hours. Coagulation Profile:  Recent Labs Lab 02/07/16 0001  INR 1.84*   CBC:  Recent Labs Lab 02/06/16 1223 02/07/16 0001 02/08/16 0520 02/09/16 0542  WBC 23.3* 21.8* 16.3* 14.7*  NEUTROABS 21.9*  --   --   --   HGB 12.8 10.6* 10.3* 10.5*  HCT 38.3 33.2* 33.3* 34.1*  MCV 86.7 87.6 89.0 90.2  PLT 380 353 348 347   Cardiac Enzymes: No results for input(s): CKTOTAL, CKMB, CKMBINDEX, TROPONINI in the last 168 hours. BNP: Invalid input(s): POCBNP CBG: No results for input(s): GLUCAP in the last 168 hours. HbA1C: No results for input(s): HGBA1C in the last 72 hours. Urine analysis:    Component Value Date/Time   COLORURINE AMBER* 02/06/2016 1650   APPEARANCEUR HAZY* 02/06/2016 1650   LABSPEC 1.010 02/06/2016 1650   PHURINE 5.5 02/06/2016 1650   GLUCOSEU NEGATIVE 02/06/2016 1650   HGBUR LARGE* 02/06/2016 1650   BILIRUBINUR MODERATE* 02/06/2016 1650   KETONESUR 15* 02/06/2016 1650   PROTEINUR 30* 02/06/2016 1650   NITRITE NEGATIVE 02/06/2016 1650   LEUKOCYTESUR NEGATIVE 02/06/2016 1650   Sepsis Labs: @LABRCNTIP (procalcitonin:4,lacticidven:4) ) Recent Results (from the past 240 hour(s))  Culture, blood (routine x 2)     Status: None (Preliminary result)   Collection Time: 02/06/16  8:39 PM  Result Value Ref Range Status   Specimen Description BLOOD LEFT HAND  Final   Special Requests BOTTLES DRAWN AEROBIC ONLY 4CC  Final   Culture NO GROWTH 2 DAYS  Final   Report Status PENDING  Incomplete  Culture, blood (routine x 2)     Status: None (Preliminary result)   Collection Time: 02/06/16  8:47 PM  Result Value Ref Range Status   Specimen  Description BLOOD LEFT HAND  Final   Special Requests BOTTLES DRAWN AEROBIC ONLY 4CC  Final   Culture NO GROWTH 2 DAYS  Final   Report Status PENDING  Incomplete  MRSA PCR Screening     Status: None   Collection Time: 02/06/16 10:39 PM  Result Value Ref Range Status   MRSA by PCR NEGATIVE NEGATIVE Final    Comment:        The GeneXpert MRSA Assay (FDA approved for NASAL specimens only), is one component of a comprehensive MRSA colonization surveillance program. It is not intended to diagnose MRSA infection nor to guide or monitor treatment for MRSA infections.      Scheduled Meds: . imipenem-cilastatin  500 mg Intravenous Q6H  . levothyroxine  50 mcg Oral QAC breakfast  . pantoprazole (PROTONIX) IV  40 mg Intravenous QHS   Continuous Infusions: . dextrose 5 % and 0.45 % NaCl with KCl 20 mEq/L 75 mL/hr at 02/09/16 1300  . heparin 1,850 Units/hr (02/09/16 0646)    Procedures/Studies: Ct Abdomen Pelvis W Contrast  02/06/2016  CLINICAL DATA:  Left lower quadrant abdominal pain for 10 days with some constipation, diarrhea and vomiting. Leukocytosis. Initial encounter. EXAM: CT ABDOMEN AND PELVIS WITH CONTRAST TECHNIQUE: Multidetector CT imaging of the abdomen and pelvis was performed using the standard protocol following bolus administration  of intravenous contrast. CONTRAST:  11mL ISOVUE-300 IOPAMIDOL (ISOVUE-300) INJECTION 61% COMPARISON:  Limited correlation made with chest CT 10/29/2015. Acute abdominal series done today. FINDINGS: Lower chest: There is a calcified right lower lobe granuloma and calcified right hilar lymph nodes. The lungs are otherwise clear. There is a small to moderate hiatal hernia and mild cardiomegaly. Hepatobiliary: The liver is normal in density without focal abnormality. No evidence of gallstones, gallbladder wall thickening or biliary dilatation. Pancreas: Unremarkable. No pancreatic ductal dilatation or surrounding inflammatory changes. Spleen: Normal in  size without focal abnormality. Adrenals/Urinary Tract: Both adrenal glands appear normal. Both kidneys appear normal. There is no evidence of urinary tract calculus or hydronephrosis. The bladder is incompletely distended, although demonstrates probable mild wall thickening. Stomach/Bowel: As above, there is a small to moderate hiatal hernia. The stomach and small bowel demonstrate no significant findings. There is a small amount of fluid throughout the small bowel which is not significantly dilated. There is a right lower quadrant inflammatory process inferior to the cecum within extraluminal fluid collection measuring 4.5 x 2.2 cm on image 81. The appendix is not clearly visualized. There is a calcification on image 71 which could reflect an at appendicolith. There are inflammatory changes throughout the right lower quadrant. The colon is decompressed. There is no focal colonic wall thickening or pericolonic inflammation. Vascular/Lymphatic: There are no enlarged abdominal or pelvic lymph nodes. Mild aortic and branch vessel atherosclerosis. Reproductive: Hysterectomy. No evidence of adnexal mass. There is some free pelvic fluid. Other: Tiny umbilical hernia containing only fat. 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 Musculoskeletal: No acute or significant osseous findings. Mild lumbar spondylosis. IMPRESSION: 1. Extensive right lower quadrant inflammatory process with diffuse inflammatory changes and an extraluminal fluid collection adjacent to the cecal tip, most consistent with perforated appendicitis. 2. No evidence of diverticulitis or bowel obstruction. 3. Small abdominal wall hernias containing only fat. 4. These results were called by telephone at the time of interpretation on 02/06/2016 at 4:22 pm to Dr. Lonia Skinner, who verbally acknowledged these results. Electronically Signed   By: Richardean Sale M.D.   On: 02/06/2016 16:23   Dg Abd Acute W/chest  02/06/2016  CLINICAL  DATA:  Shakes, fever, chills, decreased appetite, abdominal cramps, nausea and diarrhea. Symptoms onset 2 days ago. EXAM: DG ABDOMEN ACUTE W/ 1V CHEST COMPARISON:  Chest x-ray dated 09/29/2005. FINDINGS: Single view of the chest: Heart size is normal. Overall cardiomediastinal silhouette is stable in size and configuration. Benign calcified granuloma noted at the right lung base. Lungs otherwise clear. No evidence of pneumonia. No pleural effusion or pneumothorax seen. Osseous structures about the chest are unremarkable. Supine and upright views of the abdomen: Mildly distended gas-filled loops of small bowel within the central abdomen and left lower quadrant, with associated air-fluid levels. No evidence of soft tissue mass or free intraperitoneal air. Degenerative changes noted within the thoracolumbar spine. No acute - appearing osseous abnormality. IMPRESSION: 1. Mildly distended gas-filled loops of small bowel within the central abdomen and left lower quadrant, with associated air-fluid levels. Findings suggest either partial small bowel obstruction or ileus. CT abdomen/pelvis may be helpful for further characterization. 2. Heart size is normal. No evidence of acute cardiopulmonary abnormality. Electronically Signed   By: Franki Cabot M.D.   On: 02/06/2016 14:09    Jaisean Monteforte, DO  [facility] Pager [phone]  If 7PM-7AM, please contact night-coverage [email or web address] Password [email or web address] 02/09/2016, 1:49 PM   LOS: 3 days

## 2016-02-09 NOTE — Progress Notes (Signed)
ANTICOAGULATION CONSULT NOTE - Follow Up Consult  Pharmacy Consult for Heparin Indication: pulmonary embolus  Allergies  Allergen Reactions  . Bee Venom Swelling and Other (See Comments)    Patient is allergic to bee venom, mosquitos, wasps, yellow jackets and bees. Wherever she gets stung that area swells, turns hot and has fevers.    . Other Anaphylaxis and Shortness Of Breath    Hickory smoke  . Food Other (See Comments)    Tangerines. Unknown childhood allergy.   . Morphine And Related Itching  . Penicillins Other (See Comments)    UNKNOWN: CHILDHOOD ALLERGY     Patient Measurements: Height: 5\' 3"  (160 cm) Weight: 286 lb 13.1 oz (130.1 kg) IBW/kg (Calculated) : 52.4 Heparin Dosing Weight: 85 kg  Vital Signs: Temp: 97.8 F (36.6 C) (05/28 1434) Temp Source: Oral (05/28 1434) BP: 144/93 mmHg (05/28 1434) Pulse Rate: 100 (05/28 1434)  Labs:  Recent Labs  02/07/16 0001 02/08/16 0520  02/09/16 0542 02/09/16 1154 02/09/16 1944  HGB 10.6* 10.3*  --  10.5*  --   --   HCT 33.2* 33.3*  --  34.1*  --   --   PLT 353 348  --  347  --   --   LABPROT 21.2*  --   --   --   --   --   INR 1.84*  --   --   --   --   --   HEPARINUNFRC  --   --   < > 0.13* 0.24* 0.40  CREATININE 1.43* 1.07*  --  1.01*  --   --   < > = values in this interval not displayed.  Estimated Creatinine Clearance: 78.1 mL/min (by C-G formula based on Cr of 1.01).  Assessment:  60 y/o F s/p surgery for ruptured appendicitis. On heparin for history of bilateral PE, LLE DVT in setting of hypercoaguable state. Pt is on rivaroxaban PTA, last dose was 5/24. HL therapeutic at 0.4. CBC stable.   Goal of Therapy:  Heparin level 0.3-0.7 units/ml Monitor platelets by anticoagulation protocol: Yes   Plan:  Continue heparin gtt 2000 units/hr Daily HL, CBC Monitor for S&S of bleed  Levester Fresh, PharmD, BCPS, [facility] Clinical Pharmacist Pager [phone] 02/09/2016 8:49 PM

## 2016-02-09 NOTE — Progress Notes (Signed)
Pharmacy Antibiotic Note  CYNTHIS PIET is a 60 y.o. female admitted on 02/06/2016 with intra-abdominal infection. Pharmacy has been consulted for imipenem dosing. Was given a dose of cefepime and Flagyl in the ED.   Day #4 of abx for intra-abdominal infection. Afebrile, WBC elevated at 14.7.  Plan: Inc imipenem 500mg  IV Q6 due to renal function improvement Monitor clinical picture, renal function F/U C&S, abx deescalation / LOT   Height: 5\' 3"  (160 cm) Weight: 286 lb 13.1 oz (130.1 kg) IBW/kg (Calculated) : 52.4  Temp (24hrs), Avg:98.1 F (36.7 C), Min:97.9 F (36.6 C), Max:98.4 F (36.9 C)   Recent Labs Lab 02/06/16 0012 02/06/16 1223 02/06/16 1234 02/06/16 1711 02/06/16 2048 02/07/16 0001 02/08/16 0520 02/09/16 0542  WBC  --  23.3*  --   --   --  21.8* 16.3* 14.7*  CREATININE  --  0.38*  --   --   --  1.43* 1.07* 1.01*  LATICACIDVEN 0.5  --  1.01 2.05* 1.7  --   --   --     Estimated Creatinine Clearance: 78.1 mL/min (by C-G formula based on Cr of 1.01).    Allergies  Allergen Reactions  . Bee Venom Swelling and Other (See Comments)    Patient is allergic to bee venom, mosquitos, wasps, yellow jackets and bees. Wherever she gets stung that area swells, turns hot and has fevers.    . Other Anaphylaxis and Shortness Of Breath    Hickory smoke  . Food Other (See Comments)    Tangerines. Unknown childhood allergy.   . Morphine And Related Itching  . Penicillins Other (See Comments)    UNKNOWN: CHILDHOOD ALLERGY     Antimicrobials this admission: Cefepime 5/25 >> 5/25 Flagyl 5/25 >> 5/25 Imipenem 5/25 >>  Dose adjustments this admission: n/a  Microbiology results: 5/25 blood cx x 2 >> ngtd  Thank you for allowing pharmacy to be a part of this patient's care.  Angela Burke, PharmD Pharmacy Resident Pager: [phone] 02/09/2016 8:12 AM

## 2016-02-09 NOTE — Progress Notes (Signed)
ANTICOAGULATION CONSULT NOTE - Follow Up Consult  Pharmacy Consult for Heparin Indication: pulmonary embolus  Allergies  Allergen Reactions  . Bee Venom Swelling and Other (See Comments)    Patient is allergic to bee venom, mosquitos, wasps, yellow jackets and bees. Wherever she gets stung that area swells, turns hot and has fevers.    . Other Anaphylaxis and Shortness Of Breath    Hickory smoke  . Food Other (See Comments)    Tangerines. Unknown childhood allergy.   . Morphine And Related Itching  . Penicillins Other (See Comments)    UNKNOWN: CHILDHOOD ALLERGY     Patient Measurements: Height: 5\' 3"  (160 cm) Weight: 286 lb 13.1 oz (130.1 kg) IBW/kg (Calculated) : 52.4 Heparin Dosing Weight: 85 kg  Vital Signs: Temp: 97.9 F (36.6 C) (05/28 1203) Temp Source: Oral (05/28 1203) BP: 144/75 mmHg (05/28 1203) Pulse Rate: 90 (05/28 1203)  Labs:  Recent Labs  02/07/16 0001 02/08/16 0520 02/08/16 1943 02/09/16 0542 02/09/16 1154  HGB 10.6* 10.3*  --  10.5*  --   HCT 33.2* 33.3*  --  34.1*  --   PLT 353 348  --  347  --   LABPROT 21.2*  --   --   --   --   INR 1.84*  --   --   --   --   HEPARINUNFRC  --   --  <0.10* 0.13* 0.24*  CREATININE 1.43* 1.07*  --  1.01*  --     Estimated Creatinine Clearance: 78.1 mL/min (by C-G formula based on Cr of 1.01).  Assessment:  60 y/o F s/p surgery for ruptured appendicitis. On heparin for history of bilateral PE, LLE DVT in setting of hypercoaguable state. Pt is on rivaroxaban PTA, last dose was 5/24. HL remains sub therapeutic at 0.24. CBC stable.   Goal of Therapy:  Heparin level 0.3-0.7 units/ml Monitor platelets by anticoagulation protocol: Yes   Plan:  Inc heparin gtt to 2000 units/hr 6hr HL Daily HL, CBC Monitor for S&S of bleed  Angela Burke, PharmD Pharmacy Resident Pager: [phone] 02/09/2016,1:38 PM

## 2016-02-09 NOTE — Progress Notes (Signed)
Attempted report. Left name and phone number with nurse to call me back.

## 2016-02-10 ENCOUNTER — Encounter (HOSPITAL_COMMUNITY): Payer: Self-pay | Admitting: Surgery

## 2016-02-10 DIAGNOSIS — K3532 Acute appendicitis with perforation and localized peritonitis, without abscess: Secondary | ICD-10-CM | POA: Insufficient documentation

## 2016-02-10 LAB — CBC
HCT: 34.3 % — ABNORMAL LOW (ref 36.0–46.0)
Hemoglobin: 10.5 g/dL — ABNORMAL LOW (ref 12.0–15.0)
MCH: 27.4 pg (ref 26.0–34.0)
MCHC: 30.6 g/dL (ref 30.0–36.0)
MCV: 89.6 fL (ref 78.0–100.0)
PLATELETS: 364 10*3/uL (ref 150–400)
RBC: 3.83 MIL/uL — ABNORMAL LOW (ref 3.87–5.11)
RDW: 14.9 % (ref 11.5–15.5)
WBC: 14.3 10*3/uL — AB (ref 4.0–10.5)

## 2016-02-10 LAB — BASIC METABOLIC PANEL
ANION GAP: 8 (ref 5–15)
BUN: 7 mg/dL (ref 6–20)
CALCIUM: 8.5 mg/dL — AB (ref 8.9–10.3)
CO2: 22 mmol/L (ref 22–32)
Chloride: 108 mmol/L (ref 101–111)
Creatinine, Ser: 0.86 mg/dL (ref 0.44–1.00)
GFR calc Af Amer: >60 mL/min (ref >60)
GLUCOSE: 99 mg/dL (ref 65–99)
POTASSIUM: 4.8 mmol/L (ref 3.5–5.1)
SODIUM: 138 mmol/L (ref 135–145)

## 2016-02-10 LAB — HEPARIN LEVEL (UNFRACTIONATED): Heparin Unfractionated: 0.42 IU/mL (ref 0.30–0.70)

## 2016-02-10 LAB — PROCALCITONIN: PROCALCITONIN: 0.29 ng/mL

## 2016-02-10 NOTE — Progress Notes (Signed)
ANTICOAGULATION CONSULT NOTE - Follow Up Consult  Pharmacy Consult for heparin Indication: DVT/PE  Allergies  Allergen Reactions  . Bee Venom Swelling and Other (See Comments)    Patient is allergic to bee venom, mosquitos, wasps, yellow jackets and bees. Wherever she gets stung that area swells, turns hot and has fevers.    . Other Anaphylaxis and Shortness Of Breath    Hickory smoke  . Food Other (See Comments)    Tangerines. Unknown childhood allergy.   . Morphine And Related Itching  . Penicillins Other (See Comments)    UNKNOWN: CHILDHOOD ALLERGY     Patient Measurements: Height: 5\' 3"  (160 cm) Weight: 286 lb 13.1 oz (130.1 kg) IBW/kg (Calculated) : 52.4   Vital Signs: Temp: 98.4 F (36.9 C) (05/29 0400) Temp Source: Oral (05/29 0400) BP: 149/77 mmHg (05/29 0400) Pulse Rate: 96 (05/29 0400)  Labs:  Recent Labs  02/08/16 0520  02/09/16 0542 02/09/16 1154 02/09/16 1944 02/10/16 0427  HGB 10.3*  --  10.5*  --   --  10.5*  HCT 33.3*  --  34.1*  --   --  34.3*  PLT 348  --  347  --   --  364  HEPARINUNFRC  --   < > 0.13* 0.24* 0.40 0.42  CREATININE 1.07*  --  1.01*  --   --  0.86  < > = values in this interval not displayed.  Estimated Creatinine Clearance: 91.7 mL/min (by C-G formula based on Cr of 0.86).   Medications:  Scheduled:  . imipenem-cilastatin  500 mg Intravenous Q6H  . levothyroxine  50 mcg Oral QAC breakfast  . pantoprazole (PROTONIX) IV  40 mg Intravenous QHS    Assessment: 60yo female on Heparin bridge while off Xarelto, for DVT/PE in Feb 2017.  Heparin level is within goal range, Hg & pltc stable.  No bleeding noted.  Goal of Therapy:  Heparin level 0.3-0.7 units/ml Monitor platelets by anticoagulation protocol: Yes   Plan:  Continue Heparin 2000 units/hr Daily HL, CBC Watch for s/s of bleeding F/U plans to resume Xarelto  Gracy Bruins, PharmD Clinical Pharmacist [facility]

## 2016-02-10 NOTE — Care Management Note (Signed)
Case Management Note  Patient Details  Name: Erin Vasquez MRN: [id number] Date of Birth: [birth year]  Subjective/Objective:                    Action/Plan:  Ruptured appendicitis s/p laparoscopic appendectomy-still signs of inflammatory response with elevated WBC; on Primaxin.   Pulmonary embolism, bilateral (HCC)-on IV Heparin  Will continue to follow  Expected Discharge Date:                  Expected Discharge Plan:  Home/Self Care  In-House Referral:     Discharge planning Services     Post Acute Care Choice:    Choice offered to:     DME Arranged:    DME Agency:     HH Arranged:    HH Agency:     Status of Service:  In process, will continue to follow  Medicare Important Message Given:    Date Medicare IM Given:    Medicare IM give by:    Date Additional Medicare IM Given:    Additional Medicare Important Message give by:     If discussed at [place] of Stay Meetings, dates discussed:    Additional Comments:  Marilu Favre, RN 02/10/2016, 10:10 AM

## 2016-02-10 NOTE — Progress Notes (Signed)
3 Days Post-Op  Subjective: Up on floor now.  Passing a little gas.  Not having much pain.  Objective: Vital signs in last 24 hours: Temp:  [97.6 F (36.4 C)-98.4 F (36.9 C)] 98.4 F (36.9 C) (05/29 0400) Pulse Rate:  [88-101] 96 (05/29 0400) Resp:  [12-80] 80 (05/29 0400) BP: (134-154)/(75-99) 149/77 mmHg (05/29 0400) SpO2:  [84 %-97 %] 97 % (05/29 0400) Last BM Date: 02/06/16  Intake/Output from previous day: 05/28 0701 - 05/29 0700 In: 3300 [P.O.:1200; I.V.:1975; IV Piggyback:100] Out: 2655 [Urine:2600; Drains:55] Intake/Output this shift: Total I/O In: -  Out: 450 [Urine:450]  PE: General- In NAD Abdomen-soft, incisions clean, cloudy drain output, active bowel sounds  Lab Results:   Recent Labs  02/09/16 0542 02/10/16 0427  WBC 14.7* 14.3*  HGB 10.5* 10.5*  HCT 34.1* 34.3*  PLT 347 364   BMET  Recent Labs  02/09/16 0542 02/10/16 0427  NA 136 138  K 4.8 4.8  CL 106 108  CO2 23 22  GLUCOSE 99 99  BUN 16 7  CREATININE 1.01* 0.86  CALCIUM 8.4* 8.5*   PT/INR No results for input(s): LABPROT, INR in the last 72 hours. Comprehensive Metabolic Panel:    Component Value Date/Time   NA 138 02/10/2016 0427   NA 136 02/09/2016 0542   K 4.8 02/10/2016 0427   K 4.8 02/09/2016 0542   CL 108 02/10/2016 0427   CL 106 02/09/2016 0542   CO2 22 02/10/2016 0427   CO2 23 02/09/2016 0542   BUN 7 02/10/2016 0427   BUN 16 02/09/2016 0542   CREATININE 0.86 02/10/2016 0427   CREATININE 1.01* 02/09/2016 0542   GLUCOSE 99 02/10/2016 0427   GLUCOSE 99 02/09/2016 0542   CALCIUM 8.5* 02/10/2016 0427   CALCIUM 8.4* 02/09/2016 0542   AST 27 02/06/2016 1223   AST 18 10/29/2015 1033   ALT 36 02/06/2016 1223   ALT 15 10/29/2015 1033   ALKPHOS 105 02/06/2016 1223   ALKPHOS 63 10/29/2015 1033   BILITOT 0.5 02/06/2016 1223   BILITOT 0.6 10/29/2015 1033   PROT 7.6 02/06/2016 1223   PROT 7.2 10/29/2015 1033   ALBUMIN 2.9* 02/06/2016 1223   ALBUMIN 3.8 10/29/2015 1033      Studies/Results: No results found.  Anti-infectives: Anti-infectives    Start     Dose/Rate Route Frequency Ordered Stop   02/09/16 1200  imipenem-cilastatin (PRIMAXIN) 500 mg in sodium chloride 0.9 % 100 mL IVPB     500 mg 200 mL/hr over 30 Minutes Intravenous Every 6 hours 02/09/16 0746     02/07/16 2300  imipenem-cilastatin (PRIMAXIN) 500 mg in sodium chloride 0.9 % 100 mL IVPB  Status:  Discontinued     500 mg 200 mL/hr over 30 Minutes Intravenous Every 6 hours 02/06/16 2157 02/07/16 0832   02/07/16 1800  imipenem-cilastatin (PRIMAXIN) 500 mg in sodium chloride 0.9 % 100 mL IVPB  Status:  Discontinued     500 mg 200 mL/hr over 30 Minutes Intravenous Every 8 hours 02/07/16 1036 02/09/16 0746   02/07/16 1000  imipenem-cilastatin (PRIMAXIN) 500 mg in sodium chloride 0.9 % 100 mL IVPB  Status:  Discontinued     500 mg 200 mL/hr over 30 Minutes Intravenous Every 6 hours 02/07/16 0832 02/07/16 1036   02/06/16 1645  ceFEPIme (MAXIPIME) 2 g in dextrose 5 % 50 mL IVPB     2 g 100 mL/hr over 30 Minutes Intravenous  Once 02/06/16 1641 02/06/16 1740   02/06/16 1645  metroNIDAZOLE (FLAGYL) IVPB 500 mg     500 mg 100 mL/hr over 60 Minutes Intravenous  Once 02/06/16 1641 02/06/16 1823      Assessment Principal Problem:   Ruptured appendicitis s/p laparoscopic appendectomy-still signs of inflammatory response with elevated WBC; on Primaxin. Active Problems:   Pulmonary embolism, bilateral (HCC)-on IV Heparin, hemoglobin stable         LOS: 4 days   Plan: Continue current diet and IV Primaxin. Check CBC tomorrow.   Teiana Hajduk J 02/10/2016

## 2016-02-10 NOTE — Progress Notes (Signed)
PROGRESS NOTE  SOMA GENTLE [id number] DOB: [birth year] DOA: 02/06/2016 PCP: Barrie Lyme, FNP Brief History:  61 year old female with a history of DVT/PE and underlying lupus anticoagulant, hypothyroidism, GERD with Schatzki's ring presented with 10 day history of abdominal pain, fevers, chills. The patient was diagnosed with bilateral pulmonary emboli on 10/29/2015 with DVT of the left lower extremity during that time. The patient was started on rivaroxaban. Hematologic evaluation revealed lupus anticoagulant. She has been compliant with the medications. Because of her worsening abdominal pain she presented to emergency Department. CT of the abdomen and pelvis revealed right lower quadrant diffuse inflammation with extraluminal fluid at the cecal tip consistent with perforated appendicitis. The patient was initially started on cefepime and metronidazole. This was switched to imipenem. Initial WBC 23.3 with lactic acid 1.7. The patient was admitted under the surgical team, and internal medicine consultation was obtained for medical management. The patient was taken to surgery for laparoscopic appendectomy on 02/07/2016. Postoperatively, the patient was placed on intravenous heparin.  Assessment/Plan: Perforated appendicitis -02/07/2016--lap appendectomy--Dr. Ninfa Linden -Continue imipenem for now D#4 -Continue intravenous fluids-->removed K from IVF as K is trending up -Lactic acid 1.7, procalcitonin 1.88-->0.83 -Pain control  -Follow blood cultures--neg to date -increase activity-->ambulate hall -po abx when surgery feels appropriate -diet advancement per general surgery  Bilateral PE/left lower extremity DVT--hypercoagulable state -Diagnosed 10/29/2015 -Further workup revealed lupus anticoagulant -Last dose rivaroxaban 02/05/2016 at 1930 -heparin drip for now  -transition back to po rivaroxaban when cleared by surgery  Acute kidney injury -Likely secondary to  hemodynamic changes and infectious process -Serum creatinine increased from 0.38--> 1.43-->1.01-->0.86 -The patient did receive intravenous dye on 02/06/2016 -improved  Elevated BP -no formal dx of HTN -?related to pain -monitor off meds for now  Hyponatremia -Secondary to volume depletion/dehydration -Continue intravenous fluids -Improving with fluids  Hypothyroidism -Continue present dose of levothyroxine  GERD -Continue PPI -November 2014 EGD revealed Schatzki's ring which was dilated   Disposition Plan: transfer to [facility] Family Communication: Mother updated at beside 5/28   Subjective: Patient complains of intermittent abdominal pain but better than the time of admission. She is able to ablate the halls is complaining of dyspnea on exertion. There is no dyspnea at rest. Denies any vomiting, diarrhea. She is passing flatus but no bowel movement yet. Denies any headache or neck pain. No chest pain.  Objective: Filed Vitals:   02/09/16 1434 02/09/16 2134 02/10/16 0400 02/10/16 1229  BP: 144/93 145/99 149/77 164/84  Pulse: 100 97 96 86  Temp: 97.8 F (36.6 C) 98.1 F (36.7 C) 98.4 F (36.9 C) 98.4 F (36.9 C)  TempSrc: Oral Oral Oral Oral  Resp: 20 19 80 19  Height:      Weight:      SpO2: 95% 97% 97% 96%    Intake/Output Summary (Last 24 hours) at 02/10/16 1621 Last data filed at 02/10/16 1416  Gross per 24 hour  Intake   2020 ml  Output   1875 ml  Net    145 ml   Weight change:  Exam:   General:  Pt is alert, follows commands appropriately, not in acute distress  HEENT: No icterus, No thrush, No neck mass, [place]/AT  Cardiovascular: RRR, S1/S2, no rubs, no gallops  Respiratory: Fine bibasilar crackles. No wheezing.  Abdomen: Soft/+BS, mild left lower quadrant tenderness. No rebound. Nondistended., no guarding  Extremities: No edema, No lymphangitis, No petechiae, No rashes, no synovitis  Data Reviewed: I have personally reviewed following  labs and imaging studies Basic Metabolic Panel:  Recent Labs Lab 02/06/16 1223 02/07/16 0001 02/08/16 0520 02/09/16 0542 02/10/16 0427  NA 133* 135 134* 136 138  K 4.1 3.7 4.7 4.8 4.8  CL 100* 102 104 106 108  CO2 19* 20* 21* 23 22  GLUCOSE 110* 99 146* 99 99  BUN 24* 27* 22* 16 7  CREATININE 0.38* 1.43* 1.07* 1.01* 0.86  CALCIUM 9.0 8.2* 8.2* 8.4* 8.5*   Liver Function Tests:  Recent Labs Lab 02/06/16 1223  AST 27  ALT 36  ALKPHOS 105  BILITOT 0.5  PROT 7.6  ALBUMIN 2.9*   No results for input(s): LIPASE, AMYLASE in the last 168 hours. No results for input(s): AMMONIA in the last 168 hours. Coagulation Profile:  Recent Labs Lab 02/07/16 0001  INR 1.84*   CBC:  Recent Labs Lab 02/06/16 1223 02/07/16 0001 02/08/16 0520 02/09/16 0542 02/10/16 0427  WBC 23.3* 21.8* 16.3* 14.7* 14.3*  NEUTROABS 21.9*  --   --   --   --   HGB 12.8 10.6* 10.3* 10.5* 10.5*  HCT 38.3 33.2* 33.3* 34.1* 34.3*  MCV 86.7 87.6 89.0 90.2 89.6  PLT 380 353 348 347 364   Cardiac Enzymes: No results for input(s): CKTOTAL, CKMB, CKMBINDEX, TROPONINI in the last 168 hours. BNP: Invalid input(s): POCBNP CBG: No results for input(s): GLUCAP in the last 168 hours. HbA1C: No results for input(s): HGBA1C in the last 72 hours. Urine analysis:    Component Value Date/Time   COLORURINE AMBER* 02/06/2016 1650   APPEARANCEUR HAZY* 02/06/2016 1650   LABSPEC 1.010 02/06/2016 1650   PHURINE 5.5 02/06/2016 1650   GLUCOSEU NEGATIVE 02/06/2016 1650   HGBUR LARGE* 02/06/2016 1650   BILIRUBINUR MODERATE* 02/06/2016 1650   KETONESUR 15* 02/06/2016 1650   PROTEINUR 30* 02/06/2016 1650   NITRITE NEGATIVE 02/06/2016 1650   LEUKOCYTESUR NEGATIVE 02/06/2016 1650   Sepsis Labs: @LABRCNTIP (procalcitonin:4,lacticidven:4) ) Recent Results (from the past 240 hour(s))  Culture, blood (routine x 2)     Status: None (Preliminary result)   Collection Time: 02/06/16  8:39 PM  Result Value Ref Range  Status   Specimen Description BLOOD LEFT HAND  Final   Special Requests BOTTLES DRAWN AEROBIC ONLY 4CC  Final   Culture NO GROWTH 4 DAYS  Final   Report Status PENDING  Incomplete  Culture, blood (routine x 2)     Status: None (Preliminary result)   Collection Time: 02/06/16  8:47 PM  Result Value Ref Range Status   Specimen Description BLOOD LEFT HAND  Final   Special Requests BOTTLES DRAWN AEROBIC ONLY 4CC  Final   Culture NO GROWTH 4 DAYS  Final   Report Status PENDING  Incomplete  MRSA PCR Screening     Status: None   Collection Time: 02/06/16 10:39 PM  Result Value Ref Range Status   MRSA by PCR NEGATIVE NEGATIVE Final    Comment:        The GeneXpert MRSA Assay (FDA approved for NASAL specimens only), is one component of a comprehensive MRSA colonization surveillance program. It is not intended to diagnose MRSA infection nor to guide or monitor treatment for MRSA infections.      Scheduled Meds: . imipenem-cilastatin  500 mg Intravenous Q6H  . levothyroxine  50 mcg Oral QAC breakfast  . pantoprazole (PROTONIX) IV  40 mg Intravenous QHS   Continuous Infusions: . dextrose 5 % and 0.45% NaCl 30 mL/hr at  02/10/16 1416  . heparin 2,000 Units/hr (02/10/16 1617)    Procedures/Studies: Ct Abdomen Pelvis W Contrast  02/06/2016  CLINICAL DATA:  Left lower quadrant abdominal pain for 10 days with some constipation, diarrhea and vomiting. Leukocytosis. Initial encounter. EXAM: CT ABDOMEN AND PELVIS WITH CONTRAST TECHNIQUE: Multidetector CT imaging of the abdomen and pelvis was performed using the standard protocol following bolus administration of intravenous contrast. CONTRAST:  185mL ISOVUE-300 IOPAMIDOL (ISOVUE-300) INJECTION 61% COMPARISON:  Limited correlation made with chest CT 10/29/2015. Acute abdominal series done today. FINDINGS: Lower chest: There is a calcified right lower lobe granuloma and calcified right hilar lymph nodes. The lungs are otherwise clear. There is a  small to moderate hiatal hernia and mild cardiomegaly. Hepatobiliary: The liver is normal in density without focal abnormality. No evidence of gallstones, gallbladder wall thickening or biliary dilatation. Pancreas: Unremarkable. No pancreatic ductal dilatation or surrounding inflammatory changes. Spleen: Normal in size without focal abnormality. Adrenals/Urinary Tract: Both adrenal glands appear normal. Both kidneys appear normal. There is no evidence of urinary tract calculus or hydronephrosis. The bladder is incompletely distended, although demonstrates probable mild wall thickening. Stomach/Bowel: As above, there is a small to moderate hiatal hernia. The stomach and small bowel demonstrate no significant findings. There is a small amount of fluid throughout the small bowel which is not significantly dilated. There is a right lower quadrant inflammatory process inferior to the cecum within extraluminal fluid collection measuring 4.5 x 2.2 cm on image 81. The appendix is not clearly visualized. There is a calcification on image 71 which could reflect an at appendicolith. There are inflammatory changes throughout the right lower quadrant. The colon is decompressed. There is no focal colonic wall thickening or pericolonic inflammation. Vascular/Lymphatic: There are no enlarged abdominal or pelvic lymph nodes. Mild aortic and branch vessel atherosclerosis. Reproductive: Hysterectomy. No evidence of adnexal mass. There is some free pelvic fluid. Other: Tiny umbilical hernia containing only fat. 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 Musculoskeletal: No acute or significant osseous findings. Mild lumbar spondylosis. IMPRESSION: 1. Extensive right lower quadrant inflammatory process with diffuse inflammatory changes and an extraluminal fluid collection adjacent to the cecal tip, most consistent with perforated appendicitis. 2. No evidence of diverticulitis or bowel obstruction. 3. Small  abdominal wall hernias containing only fat. 4. These results were called by telephone at the time of interpretation on 02/06/2016 at 4:22 pm to Dr. Lonia Skinner, who verbally acknowledged these results. Electronically Signed   By: Richardean Sale M.D.   On: 02/06/2016 16:23   Dg Abd Acute W/chest  02/06/2016  CLINICAL DATA:  Shakes, fever, chills, decreased appetite, abdominal cramps, nausea and diarrhea. Symptoms onset 2 days ago. EXAM: DG ABDOMEN ACUTE W/ 1V CHEST COMPARISON:  Chest x-ray dated 09/29/2005. FINDINGS: Single view of the chest: Heart size is normal. Overall cardiomediastinal silhouette is stable in size and configuration. Benign calcified granuloma noted at the right lung base. Lungs otherwise clear. No evidence of pneumonia. No pleural effusion or pneumothorax seen. Osseous structures about the chest are unremarkable. Supine and upright views of the abdomen: Mildly distended gas-filled loops of small bowel within the central abdomen and left lower quadrant, with associated air-fluid levels. No evidence of soft tissue mass or free intraperitoneal air. Degenerative changes noted within the thoracolumbar spine. No acute - appearing osseous abnormality. IMPRESSION: 1. Mildly distended gas-filled loops of small bowel within the central abdomen and left lower quadrant, with associated air-fluid levels. Findings suggest either partial small bowel  obstruction or ileus. CT abdomen/pelvis may be helpful for further characterization. 2. Heart size is normal. No evidence of acute cardiopulmonary abnormality. Electronically Signed   By: Franki Cabot M.D.   On: 02/06/2016 14:09    Sha Burling, DO  [facility] Pager [phone]  If 7PM-7AM, please contact night-coverage [email or web address] Password [email or web address] 02/10/2016, 4:21 PM   LOS: 4 days

## 2016-02-11 ENCOUNTER — Inpatient Hospital Stay (HOSPITAL_COMMUNITY): Payer: Medicaid Other

## 2016-02-11 LAB — BASIC METABOLIC PANEL
ANION GAP: 11 (ref 5–15)
BUN: <5 mg/dL — ABNORMAL LOW (ref 6–20)
CHLORIDE: 105 mmol/L (ref 101–111)
CO2: 21 mmol/L — AB (ref 22–32)
Calcium: 8.8 mg/dL — ABNORMAL LOW (ref 8.9–10.3)
Creatinine, Ser: 0.77 mg/dL (ref 0.44–1.00)
GFR calc non Af Amer: >60 mL/min (ref >60)
Glucose, Bld: 97 mg/dL (ref 65–99)
POTASSIUM: 4.5 mmol/L (ref 3.5–5.1)
Sodium: 137 mmol/L (ref 135–145)

## 2016-02-11 LAB — CBC
HEMATOCRIT: 32.9 % — AB (ref 36.0–46.0)
HEMOGLOBIN: 10.1 g/dL — AB (ref 12.0–15.0)
MCH: 27.6 pg (ref 26.0–34.0)
MCHC: 30.7 g/dL (ref 30.0–36.0)
MCV: 89.9 fL (ref 78.0–100.0)
Platelets: 381 10*3/uL (ref 150–400)
RBC: 3.66 MIL/uL — ABNORMAL LOW (ref 3.87–5.11)
RDW: 14.9 % (ref 11.5–15.5)
WBC: 15.4 10*3/uL — AB (ref 4.0–10.5)

## 2016-02-11 LAB — CULTURE, BLOOD (ROUTINE X 2)
CULTURE: NO GROWTH
Culture: NO GROWTH

## 2016-02-11 LAB — URINALYSIS, ROUTINE W REFLEX MICROSCOPIC
Bilirubin Urine: NEGATIVE
Glucose, UA: NEGATIVE mg/dL
Hgb urine dipstick: NEGATIVE
Ketones, ur: NEGATIVE mg/dL
Leukocytes, UA: NEGATIVE
NITRITE: NEGATIVE
Protein, ur: NEGATIVE mg/dL
SPECIFIC GRAVITY, URINE: 1.007 (ref 1.005–1.030)
pH: 5.5 (ref 5.0–8.0)

## 2016-02-11 LAB — HEPARIN LEVEL (UNFRACTIONATED)
HEPARIN UNFRACTIONATED: 0.24 [IU]/mL — AB (ref 0.30–0.70)
HEPARIN UNFRACTIONATED: 0.54 [IU]/mL (ref 0.30–0.70)
Heparin Unfractionated: 0.58 IU/mL (ref 0.30–0.70)

## 2016-02-11 NOTE — Progress Notes (Signed)
PROGRESS NOTE  Erin Vasquez [id number] DOB: September 13, 1956 DOA: 02/06/2016 PCP: Barrie Lyme, FNP  Brief History:  60 year old female with a history of DVT/PE and underlying lupus anticoagulant, hypothyroidism, GERD with Schatzki's ring presented with 10 day history of abdominal pain, fevers, chills. The patient was diagnosed with bilateral pulmonary emboli on 10/29/2015 with DVT of the left lower extremity during that time. The patient was started on rivaroxaban. Hematologic evaluation revealed lupus anticoagulant. She has been compliant with the medications. Because of her worsening abdominal pain she presented to emergency Department. CT of the abdomen and pelvis revealed right lower quadrant diffuse inflammation with extraluminal fluid at the cecal tip consistent with perforated appendicitis. The patient was initially started on cefepime and metronidazole. This was switched to imipenem. Initial WBC 23.3 with lactic acid 1.7. The patient was admitted under the surgical team, and internal medicine consultation was obtained for medical management. The patient was taken to surgery for laparoscopic appendectomy on 02/07/2016. Postoperatively, the patient was placed on intravenous heparin.  Assessment/Plan: Perforated appendicitis -02/07/2016--lap appendectomy--Dr. Ninfa Linden -Continue imipenem for now D#5 -Continue intravenous fluids-->removed K from IVF as K is trending up -Lactic acid 1.7, procalcitonin 1.88-->0.83 -Follow blood cultures--neg to date -increase activity-->ambulate hall -po abx when surgery feels appropriate -diet advancement per general surgery -02/11/16 WBC trending up-->may need CT abdomen if up again 5/31  Bilateral PE/left lower extremity DVT--hypercoagulable state -Diagnosed 10/29/2015 -Further workup revealed lupus anticoagulant -Last dose rivaroxaban 02/05/2016 at 1930 -heparin drip for now  -transition back to po rivaroxaban when cleared by  surgery--will likely need to restart 15 mg bid (when ready) due to prolonged time off  Leukocytosis -as above -repeat UA--no pyuria -5/31 CXR--no infiltrates -continue imipenem per general surgery  Acute kidney injury -Likely secondary to hemodynamic changes and infectious process -Serum creatinine increased from 0.38--> 1.43-->1.01-->0.86 -The patient did receive intravenous dye on 02/06/2016 -improved  Elevated BP -no formal dx of HTN -?related to pain -monitor off meds for now  Hyponatremia -Secondary to volume depletion/dehydration -Continue intravenous fluids -Improved with fluids  Hypothyroidism -Continue present dose of levothyroxine  GERD -Continue PPI -November 2014 EGD revealed Schatzki's ring which was dilated   Disposition Plan: per primary Family Communication: Mother updated at beside 5/29  Subjective: Overall abdominal pain is controlled. Denies any fevers, chills, chest pain, shortness breath, nausea, vomiting, diarrhea, dysuria, hematuria. No hematochezia or melena.  Objective: Filed Vitals:   02/10/16 2239 02/11/16 0428 02/11/16 1424 02/11/16 1633  BP: 143/82 134/78 82/68 153/81  Pulse: 97 89 89   Temp: 98.7 F (37.1 C) 98.4 F (36.9 C) 97.8 F (36.6 C)   TempSrc: Oral Oral Oral   Resp:   19   Height:      Weight:      SpO2: 97% 96% 97%     Intake/Output Summary (Last 24 hours) at 02/11/16 1856 Last data filed at 02/11/16 1803  Gross per 24 hour  Intake 2995.67 ml  Output   4005 ml  Net -1009.33 ml   Weight change:  Exam:   General:  Pt is alert, follows commands appropriately, not in acute distress  HEENT: No icterus, No thrush, No neck mass, [place]/AT  Cardiovascular: RRR, S1/S2, no rubs, no gallops  Respiratory: The patient crackles without any wheezing. Good air movement.  Abdomen: Soft/+BS, non tender, non distended, no guarding  Extremities: trace LE edema, No lymphangitis, No petechiae, No rashes, no  synovitis   Data  Reviewed: I have personally reviewed following labs and imaging studies Basic Metabolic Panel:  Recent Labs Lab 02/07/16 0001 02/08/16 0520 02/09/16 0542 02/10/16 0427 02/11/16 0450  NA 135 134* 136 138 137  K 3.7 4.7 4.8 4.8 4.5  CL 102 104 106 108 105  CO2 20* 21* 23 22 21*  GLUCOSE 99 146* 99 99 97  BUN 27* 22* 16 7 <5*  CREATININE 1.43* 1.07* 1.01* 0.86 0.77  CALCIUM 8.2* 8.2* 8.4* 8.5* 8.8*   Liver Function Tests:  Recent Labs Lab 02/06/16 1223  AST 27  ALT 36  ALKPHOS 105  BILITOT 0.5  PROT 7.6  ALBUMIN 2.9*   No results for input(s): LIPASE, AMYLASE in the last 168 hours. No results for input(s): AMMONIA in the last 168 hours. Coagulation Profile:  Recent Labs Lab 02/07/16 0001  INR 1.84*   CBC:  Recent Labs Lab 02/06/16 1223 02/07/16 0001 02/08/16 0520 02/09/16 0542 02/10/16 0427 02/11/16 0450  WBC 23.3* 21.8* 16.3* 14.7* 14.3* 15.4*  NEUTROABS 21.9*  --   --   --   --   --   HGB 12.8 10.6* 10.3* 10.5* 10.5* 10.1*  HCT 38.3 33.2* 33.3* 34.1* 34.3* 32.9*  MCV 86.7 87.6 89.0 90.2 89.6 89.9  PLT 380 353 348 347 364 381   Cardiac Enzymes: No results for input(s): CKTOTAL, CKMB, CKMBINDEX, TROPONINI in the last 168 hours. BNP: Invalid input(s): POCBNP CBG: No results for input(s): GLUCAP in the last 168 hours. HbA1C: No results for input(s): HGBA1C in the last 72 hours. Urine analysis:    Component Value Date/Time   COLORURINE YELLOW 02/11/2016 [place] 02/11/2016 1646   LABSPEC 1.007 02/11/2016 1646   PHURINE 5.5 02/11/2016 1646   GLUCOSEU NEGATIVE 02/11/2016 1646   HGBUR NEGATIVE 02/11/2016 1646   BILIRUBINUR NEGATIVE 02/11/2016 1646   KETONESUR NEGATIVE 02/11/2016 1646   PROTEINUR NEGATIVE 02/11/2016 1646   NITRITE NEGATIVE 02/11/2016 1646   LEUKOCYTESUR NEGATIVE 02/11/2016 1646   Sepsis Labs: @LABRCNTIP (procalcitonin:4,lacticidven:4) ) Recent Results (from the past 240 hour(s))  Culture,  blood (routine x 2)     Status: None   Collection Time: 02/06/16  8:39 PM  Result Value Ref Range Status   Specimen Description BLOOD LEFT HAND  Final   Special Requests BOTTLES DRAWN AEROBIC ONLY 4CC  Final   Culture NO GROWTH 5 DAYS  Final   Report Status 02/11/2016 FINAL  Final  Culture, blood (routine x 2)     Status: None   Collection Time: 02/06/16  8:47 PM  Result Value Ref Range Status   Specimen Description BLOOD LEFT HAND  Final   Special Requests BOTTLES DRAWN AEROBIC ONLY 4CC  Final   Culture NO GROWTH 5 DAYS  Final   Report Status 02/11/2016 FINAL  Final  MRSA PCR Screening     Status: None   Collection Time: 02/06/16 10:39 PM  Result Value Ref Range Status   MRSA by PCR NEGATIVE NEGATIVE Final    Comment:        The GeneXpert MRSA Assay (FDA approved for NASAL specimens only), is one component of a comprehensive MRSA colonization surveillance program. It is not intended to diagnose MRSA infection nor to guide or monitor treatment for MRSA infections.      Scheduled Meds: . imipenem-cilastatin  500 mg Intravenous Q6H  . levothyroxine  50 mcg Oral QAC breakfast  . pantoprazole (PROTONIX) IV  40 mg Intravenous QHS   Continuous Infusions: . dextrose 5 %  and 0.45% NaCl 30 mL/hr at 02/10/16 1416  . heparin 2,250 Units/hr (02/11/16 1603)    Procedures/Studies: Ct Abdomen Pelvis W Contrast  02/06/2016  CLINICAL DATA:  Left lower quadrant abdominal pain for 10 days with some constipation, diarrhea and vomiting. Leukocytosis. Initial encounter. EXAM: CT ABDOMEN AND PELVIS WITH CONTRAST TECHNIQUE: Multidetector CT imaging of the abdomen and pelvis was performed using the standard protocol following bolus administration of intravenous contrast. CONTRAST:  136mL ISOVUE-300 IOPAMIDOL (ISOVUE-300) INJECTION 61% COMPARISON:  Limited correlation made with chest CT 10/29/2015. Acute abdominal series done today. FINDINGS: Lower chest: There is a calcified right lower lobe  granuloma and calcified right hilar lymph nodes. The lungs are otherwise clear. There is a small to moderate hiatal hernia and mild cardiomegaly. Hepatobiliary: The liver is normal in density without focal abnormality. No evidence of gallstones, gallbladder wall thickening or biliary dilatation. Pancreas: Unremarkable. No pancreatic ductal dilatation or surrounding inflammatory changes. Spleen: Normal in size without focal abnormality. Adrenals/Urinary Tract: Both adrenal glands appear normal. Both kidneys appear normal. There is no evidence of urinary tract calculus or hydronephrosis. The bladder is incompletely distended, although demonstrates probable mild wall thickening. Stomach/Bowel: As above, there is a small to moderate hiatal hernia. The stomach and small bowel demonstrate no significant findings. There is a small amount of fluid throughout the small bowel which is not significantly dilated. There is a right lower quadrant inflammatory process inferior to the cecum within extraluminal fluid collection measuring 4.5 x 2.2 cm on image 81. The appendix is not clearly visualized. There is a calcification on image 71 which could reflect an at appendicolith. There are inflammatory changes throughout the right lower quadrant. The colon is decompressed. There is no focal colonic wall thickening or pericolonic inflammation. Vascular/Lymphatic: There are no enlarged abdominal or pelvic lymph nodes. Mild aortic and branch vessel atherosclerosis. Reproductive: Hysterectomy. No evidence of adnexal mass. There is some free pelvic fluid. Other: Tiny umbilical hernia containing only fat. 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 Musculoskeletal: No acute or significant osseous findings. Mild lumbar spondylosis. IMPRESSION: 1. Extensive right lower quadrant inflammatory process with diffuse inflammatory changes and an extraluminal fluid collection adjacent to the cecal tip, most consistent with  perforated appendicitis. 2. No evidence of diverticulitis or bowel obstruction. 3. Small abdominal wall hernias containing only fat. 4. These results were called by telephone at the time of interpretation on 02/06/2016 at 4:22 pm to Dr. Lonia Skinner, who verbally acknowledged these results. Electronically Signed   By: Richardean Sale M.D.   On: 02/06/2016 16:23   Dg Abd Acute W/chest  02/06/2016  CLINICAL DATA:  Shakes, fever, chills, decreased appetite, abdominal cramps, nausea and diarrhea. Symptoms onset 2 days ago. EXAM: DG ABDOMEN ACUTE W/ 1V CHEST COMPARISON:  Chest x-ray dated 09/29/2005. FINDINGS: Single view of the chest: Heart size is normal. Overall cardiomediastinal silhouette is stable in size and configuration. Benign calcified granuloma noted at the right lung base. Lungs otherwise clear. No evidence of pneumonia. No pleural effusion or pneumothorax seen. Osseous structures about the chest are unremarkable. Supine and upright views of the abdomen: Mildly distended gas-filled loops of small bowel within the central abdomen and left lower quadrant, with associated air-fluid levels. No evidence of soft tissue mass or free intraperitoneal air. Degenerative changes noted within the thoracolumbar spine. No acute - appearing osseous abnormality. IMPRESSION: 1. Mildly distended gas-filled loops of small bowel within the central abdomen and left lower quadrant, with associated air-fluid levels.  Findings suggest either partial small bowel obstruction or ileus. CT abdomen/pelvis may be helpful for further characterization. 2. Heart size is normal. No evidence of acute cardiopulmonary abnormality. Electronically Signed   By: Franki Cabot M.D.   On: 02/06/2016 14:09    Aarianna Hoadley, DO  [facility] Pager [phone]  If 7PM-7AM, please contact night-coverage [email or web address] Password [email or web address] 02/11/2016, 6:56 PM   LOS: 5 days

## 2016-02-11 NOTE — Progress Notes (Signed)
Patient ID: Erin Vasquez, female   DOB: Jan 12, 1956, 60 y.o.   MRN: [id number]     [facility]      [address] [place], [place] [address]    Phone: [phone] FAX: [phone]     Subjective: 24m drainage output yesterday.  About 371mpurulent in bulb now. Tolerating fulls. Mostly c/o pain llq. No n/v. Passing flatus. Afebrile.  WBC increased 14.3k to 15.4k.   Objective:  Vital signs:  Filed Vitals:   02/10/16 0400 02/10/16 1229 02/10/16 2239 02/11/16 0428  BP: 149/77 164/84 143/82 134/78  Pulse: 96 86 97 89  Temp: 98.4 F (36.9 C) 98.4 F (36.9 C) 98.7 F (37.1 C) 98.4 F (36.9 C)  TempSrc: Oral Oral Oral Oral  Resp: 80 19    Height:      Weight:      SpO2: 97% 96% 97% 96%    Last BM Date: 02/06/16  Intake/Output   Yesterday:  05/29 0701 - 05/30 0700 In: 2275.7 [P.O.:960; I.V.:1215.7; IV Piggyback:100] Out: 1985 [U[XBMWU:1324Drains:35] This shift:  Total I/O In: 480 [P.O.:480] Out: 1000 [Urine:1000]   Physical Exam: General: Pt awake/alert/oriented x4 in no acute distress Chest: cta.  No chest wall pain w good excursion CV:  Pulses intact.  Regular rhythm Abdomen: Soft.  Nondistended.   Mildly tender at incisions only.  rlq drain with purulent drainage. No evidence of peritonitis.  No incarcerated hernias. Ext:  SCDs BLE.  No mjr edema.  No cyanosis Skin: No petechiae / purpura   Problem List:   Principal Problem:   Ruptured appendicitis Active Problems:   Pulmonary embolism, bilateral (HCC)   Thyroid activity decreased   Hyponatremia   Sepsis, unspecified organism (HCC)   Dehydration   GERD (gastroesophageal reflux disease)   AKI (acute kidney injury) ([place]  Acute perforated appendicitis    Results:   Labs: Results for orders placed or performed during the hospital encounter of 02/06/16 (from the past 48 hour(s))  Heparin level (unfractionated)     Status: Abnormal   Collection Time:  02/09/16 11:54 AM  Result Value Ref Range   Heparin Unfractionated 0.24 (L) 0.30 - 0.70 IU/mL    Comment:        IF HEPARIN RESULTS ARE BELOW EXPECTED VALUES, AND PATIENT DOSAGE HAS BEEN CONFIRMED, SUGGEST FOLLOW UP TESTING OF ANTITHROMBIN III LEVELS.   Heparin level (unfractionated)     Status: None   Collection Time: 02/09/16  7:44 PM  Result Value Ref Range   Heparin Unfractionated 0.40 0.30 - 0.70 IU/mL    Comment:        IF HEPARIN RESULTS ARE BELOW EXPECTED VALUES, AND PATIENT DOSAGE HAS BEEN CONFIRMED, SUGGEST FOLLOW UP TESTING OF ANTITHROMBIN III LEVELS.   Procalcitonin     Status: None   Collection Time: 02/10/16  4:27 AM  Result Value Ref Range   Procalcitonin 0.29 ng/mL    Comment:        Interpretation: PCT (Procalcitonin) <= 0.5 ng/mL: Systemic infection (sepsis) is not likely. Local bacterial infection is possible. (NOTE)         ICU PCT Algorithm               Non ICU PCT Algorithm    ----------------------------     ------------------------------         PCT < 0.25 ng/mL                 PCT <  0.1 ng/mL     Stopping of antibiotics            Stopping of antibiotics       strongly encouraged.               strongly encouraged.    ----------------------------     ------------------------------       PCT level decrease by               PCT < 0.25 ng/mL       >= 80% from peak PCT       OR PCT 0.25 - 0.5 ng/mL          Stopping of antibiotics                                             encouraged.     Stopping of antibiotics           encouraged.    ----------------------------     ------------------------------       PCT level decrease by              PCT >= 0.25 ng/mL       < 80% from peak PCT        AND PCT >= 0.5 ng/mL            Continuin g antibiotics                                              encouraged.       Continuing antibiotics            encouraged.    ----------------------------     ------------------------------     PCT level increase  compared          PCT > 0.5 ng/mL         with peak PCT AND          PCT >= 0.5 ng/mL             Escalation of antibiotics                                          strongly encouraged.      Escalation of antibiotics        strongly encouraged.   Heparin level (unfractionated)     Status: None   Collection Time: 02/10/16  4:27 AM  Result Value Ref Range   Heparin Unfractionated 0.42 0.30 - 0.70 IU/mL    Comment:        IF HEPARIN RESULTS ARE BELOW EXPECTED VALUES, AND PATIENT DOSAGE HAS BEEN CONFIRMED, SUGGEST FOLLOW UP TESTING OF ANTITHROMBIN III LEVELS.   CBC     Status: Abnormal   Collection Time: 02/10/16  4:27 AM  Result Value Ref Range   WBC 14.3 (H) 4.0 - 10.5 K/uL   RBC 3.83 (L) 3.87 - 5.11 MIL/uL   Hemoglobin 10.5 (L) 12.0 - 15.0 g/dL   HCT 34.3 (L) 36.0 - 46.0 %   MCV 89.6 78.0 - 100.0 fL   MCH 27.4 26.0 - 34.0 pg   MCHC 30.6 30.0 - 36.0 g/dL  RDW 14.9 11.5 - 15.5 %   Platelets 364 150 - 400 K/uL  Basic metabolic panel     Status: Abnormal   Collection Time: 02/10/16  4:27 AM  Result Value Ref Range   Sodium 138 135 - 145 mmol/L   Potassium 4.8 3.5 - 5.1 mmol/L   Chloride 108 101 - 111 mmol/L   CO2 22 22 - 32 mmol/L   Glucose, Bld 99 65 - 99 mg/dL   BUN 7 6 - 20 mg/dL   Creatinine, Ser 0.86 0.44 - 1.00 mg/dL   Calcium 8.5 (L) 8.9 - 10.3 mg/dL   GFR calc non Af Amer >60 >60 mL/min   GFR calc Af Amer >60 >60 mL/min    Comment: (NOTE) The eGFR has been calculated using the CKD EPI equation. This calculation has not been validated in all clinical situations. eGFR's persistently <60 mL/min signify possible Chronic Kidney Disease.    Anion gap 8 5 - 15  Heparin level (unfractionated)     Status: Abnormal   Collection Time: 02/11/16  4:50 AM  Result Value Ref Range   Heparin Unfractionated 0.24 (L) 0.30 - 0.70 IU/mL    Comment:        IF HEPARIN RESULTS ARE BELOW EXPECTED VALUES, AND PATIENT DOSAGE HAS BEEN CONFIRMED, SUGGEST FOLLOW UP TESTING OF  ANTITHROMBIN III LEVELS.   CBC     Status: Abnormal   Collection Time: 02/11/16  4:50 AM  Result Value Ref Range   WBC 15.4 (H) 4.0 - 10.5 K/uL   RBC 3.66 (L) 3.87 - 5.11 MIL/uL   Hemoglobin 10.1 (L) 12.0 - 15.0 g/dL   HCT 32.9 (L) 36.0 - 46.0 %   MCV 89.9 78.0 - 100.0 fL   MCH 27.6 26.0 - 34.0 pg   MCHC 30.7 30.0 - 36.0 g/dL   RDW 14.9 11.5 - 15.5 %   Platelets 381 150 - 400 K/uL  Basic metabolic panel     Status: Abnormal   Collection Time: 02/11/16  4:50 AM  Result Value Ref Range   Sodium 137 135 - 145 mmol/L   Potassium 4.5 3.5 - 5.1 mmol/L   Chloride 105 101 - 111 mmol/L   CO2 21 (L) 22 - 32 mmol/L   Glucose, Bld 97 65 - 99 mg/dL   BUN <5 (L) 6 - 20 mg/dL   Creatinine, Ser 0.77 0.44 - 1.00 mg/dL   Calcium 8.8 (L) 8.9 - 10.3 mg/dL   GFR calc non Af Amer >60 >60 mL/min   GFR calc Af Amer >60 >60 mL/min    Comment: (NOTE) The eGFR has been calculated using the CKD EPI equation. This calculation has not been validated in all clinical situations. eGFR's persistently <60 mL/min signify possible Chronic Kidney Disease.    Anion gap 11 5 - 15    Imaging / Studies: No results found.  Medications / Allergies:  Scheduled Meds: . imipenem-cilastatin  500 mg Intravenous Q6H  . levothyroxine  50 mcg Oral QAC breakfast  . pantoprazole (PROTONIX) IV  40 mg Intravenous QHS   Continuous Infusions: . dextrose 5 % and 0.45% NaCl 30 mL/hr at 02/10/16 1416  . heparin 2,250 Units/hr (02/11/16 0617)   PRN Meds:.diphenhydrAMINE **OR** diphenhydrAMINE, HYDROmorphone (DILAUDID) injection, ondansetron **OR** ondansetron (ZOFRAN) IV, oxyCODONE-acetaminophen  Antibiotics: Anti-infectives    Start     Dose/Rate Route Frequency Ordered Stop   02/09/16 1200  imipenem-cilastatin (PRIMAXIN) 500 mg in sodium chloride 0.9 % 100 mL IVPB  500 mg 200 mL/hr over 30 Minutes Intravenous Every 6 hours 02/09/16 0746     02/07/16 2300  imipenem-cilastatin (PRIMAXIN) 500 mg in sodium chloride 0.9  % 100 mL IVPB  Status:  Discontinued     500 mg 200 mL/hr over 30 Minutes Intravenous Every 6 hours 02/06/16 2157 02/07/16 0832   02/07/16 1800  imipenem-cilastatin (PRIMAXIN) 500 mg in sodium chloride 0.9 % 100 mL IVPB  Status:  Discontinued     500 mg 200 mL/hr over 30 Minutes Intravenous Every 8 hours 02/07/16 1036 02/09/16 0746   02/07/16 1000  imipenem-cilastatin (PRIMAXIN) 500 mg in sodium chloride 0.9 % 100 mL IVPB  Status:  Discontinued     500 mg 200 mL/hr over 30 Minutes Intravenous Every 6 hours 02/07/16 0832 02/07/16 1036   02/06/16 1645  ceFEPIme (MAXIPIME) 2 g in dextrose 5 % 50 mL IVPB     2 g 100 mL/hr over 30 Minutes Intravenous  Once 02/06/16 1641 02/06/16 1740   02/06/16 1645  metroNIDAZOLE (FLAGYL) IVPB 500 mg     500 mg 100 mL/hr over 60 Minutes Intravenous  Once 02/06/16 1641 02/06/16 1823       Assessment/Plan Acute perforated appendicitis POD#4 laparoscopic appendectomy---Dr. Ninfa Linden -advance diet, mobilize, IS, pain control -continue drain(purulent drainage) -monitor WBC, if it continues to trend will need a CT to r/u other undrained abscesses  Acute renal insufficiency-resolvedAppreciate medicine input DVT/PE-heparin gtt.  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 Followed by Dr. Cindee Salt, followed by Dr. Carloyn Jaeger spigelian hernia-observe  ID-primaxin D#4 FEN-soft diet. VTE prophylaxis-scd, heparin Dispo-floor  Erby Pian, [facility] Pager [phone]) For consults and floor pages call [phone](7A-4:30P)  02/11/2016 11:05 AM

## 2016-02-11 NOTE — Progress Notes (Signed)
ANTICOAGULATION CONSULT NOTE - Follow Up Consult  Pharmacy Consult for heparin Indication: DVT/PE  Allergies  Allergen Reactions  . Bee Venom Swelling and Other (See Comments)    Patient is allergic to bee venom, mosquitos, wasps, yellow jackets and bees. Wherever she gets stung that area swells, turns hot and has fevers.    . Other Anaphylaxis and Shortness Of Breath    Hickory smoke  . Food Other (See Comments)    Tangerines. Unknown childhood allergy.   . Morphine And Related Itching  . Penicillins Other (See Comments)    UNKNOWN: CHILDHOOD ALLERGY     Patient Measurements: Height: 5\' 3"  (160 cm) Weight: 286 lb 13.1 oz (130.1 kg) IBW/kg (Calculated) : 52.4  Heparin Dosing Weight: 85 kg   Vital Signs: Temp: 98.4 F (36.9 C) (05/30 0428) Temp Source: Oral (05/30 0428) BP: 134/78 mmHg (05/30 0428) Pulse Rate: 89 (05/30 0428)  Labs:  Recent Labs  02/09/16 0542  02/10/16 0427 02/11/16 0450 02/11/16 1305  HGB 10.5*  --  10.5* 10.1*  --   HCT 34.1*  --  34.3* 32.9*  --   PLT 347  --  364 381  --   HEPARINUNFRC 0.13*  < > 0.42 0.24* 0.58  CREATININE 1.01*  --  0.86 0.77  --   < > = values in this interval not displayed.  Estimated Creatinine Clearance: 98.6 mL/min (by C-G formula based on Cr of 0.77).   Medications:  Scheduled:  . imipenem-cilastatin  500 mg Intravenous Q6H  . levothyroxine  50 mcg Oral QAC breakfast  . pantoprazole (PROTONIX) IV  40 mg Intravenous QHS    Assessment: 60yo female on PTA rivaroxaban for DVT/PE. Pharmacy now consulted for heparin gtt. Transition back to Xarelto when cleared by surgery. Last HL was therapeutic at 0.58. Hgb stable but low at 10.1, plts wnl. No issues noted.  Goal of Therapy:  Heparin level 0.3-0.7 units/ml Monitor platelets by anticoagulation protocol: Yes   Plan:  Continue heparin gtt at 2,250 units/hr Check 6 hr confirmatory HL Monitor daily HL, CBC, s/s of bleed F/U transition back to [place], PharmD, [facility] Clinical Pharmacist Pager [phone] 02/11/2016 1:59 PM

## 2016-02-11 NOTE — Progress Notes (Signed)
ANTICOAGULATION CONSULT NOTE - Follow Up Consult  Pharmacy Consult for heparin Indication: DVT/PE  Allergies  Allergen Reactions  . Bee Venom Swelling and Other (See Comments)    Patient is allergic to bee venom, mosquitos, wasps, yellow jackets and bees. Wherever she gets stung that area swells, turns hot and has fevers.    . Other Anaphylaxis and Shortness Of Breath    Hickory smoke  . Food Other (See Comments)    Tangerines. Unknown childhood allergy.   . Morphine And Related Itching  . Penicillins Other (See Comments)    UNKNOWN: CHILDHOOD ALLERGY     Patient Measurements: Height: 5\' 3"  (160 cm) Weight: 286 lb 13.1 oz (130.1 kg) IBW/kg (Calculated) : 52.4  Heparin Dosing Weight: 85 kg   Vital Signs: Temp: 97.8 F (36.6 C) (05/30 1424) Temp Source: Oral (05/30 1424) BP: 153/81 mmHg (05/30 1633) Pulse Rate: 89 (05/30 1424)  Labs:  Recent Labs  02/09/16 0542  02/10/16 0427 02/11/16 0450 02/11/16 1305 02/11/16 1819  HGB 10.5*  --  10.5* 10.1*  --   --   HCT 34.1*  --  34.3* 32.9*  --   --   PLT 347  --  364 381  --   --   HEPARINUNFRC 0.13*  < > 0.42 0.24* 0.58 0.54  CREATININE 1.01*  --  0.86 0.77  --   --   < > = values in this interval not displayed.  Estimated Creatinine Clearance: 98.6 mL/min (by C-G formula based on Cr of 0.77).   Medications:  Scheduled:  . imipenem-cilastatin  500 mg Intravenous Q6H  . levothyroxine  50 mcg Oral QAC breakfast  . pantoprazole (PROTONIX) IV  40 mg Intravenous QHS    Assessment: 60yo female on PTA rivaroxaban for history of LLE DVT/PE bilateral diagnosed 10/29/15 and further workup revealed lupus anticoagulant. Rivaroxaban now on hold.  Pharmacy consulted for heparin gtt. Transition back to Xarelto when cleared by surgery.  Today's AM  heparin level was therapeutic at 0.58 on 2250 units/hr. Repeat 6 hr HL to confirm remains therapeutic at 0.54 on same heparin rate. No bleeding noted.  Goal of Therapy:  Heparin level  0.3-0.7 units/ml Monitor platelets by anticoagulation protocol: Yes   Plan:  Continue heparin gtt at 2,250 units/hr Monitor daily HL, CBC, s/s of bleed F/U transition back to [place], [place] Pharmacist Pager: [phone] 02/11/2016 7:33 PM

## 2016-02-11 NOTE — Progress Notes (Signed)
ANTICOAGULATION CONSULT NOTE - Follow Up Consult  Pharmacy Consult for heparin Indication: DVT/PE  Allergies  Allergen Reactions  . Bee Venom Swelling and Other (See Comments)    Patient is allergic to bee venom, mosquitos, wasps, yellow jackets and bees. Wherever she gets stung that area swells, turns hot and has fevers.    . Other Anaphylaxis and Shortness Of Breath    Hickory smoke  . Food Other (See Comments)    Tangerines. Unknown childhood allergy.   . Morphine And Related Itching  . Penicillins Other (See Comments)    UNKNOWN: CHILDHOOD ALLERGY     Patient Measurements: Height: 5\' 3"  (160 cm) Weight: 286 lb 13.1 oz (130.1 kg) IBW/kg (Calculated) : 52.4  Heparin Dosing Weight: 85 kg   Vital Signs: Temp: 98.4 F (36.9 C) (05/30 0428) Temp Source: Oral (05/30 0428) BP: 134/78 mmHg (05/30 0428) Pulse Rate: 89 (05/30 0428)  Labs:  Recent Labs  02/09/16 0542  02/09/16 1944 02/10/16 0427 02/11/16 0450  HGB 10.5*  --   --  10.5* 10.1*  HCT 34.1*  --   --  34.3* 32.9*  PLT 347  --   --  364 381  HEPARINUNFRC 0.13*  < > 0.40 0.42 0.24*  CREATININE 1.01*  --   --  0.86  --   < > = values in this interval not displayed.  Estimated Creatinine Clearance: 91.7 mL/min (by C-G formula based on Cr of 0.86).   Medications:  Scheduled:  . imipenem-cilastatin  500 mg Intravenous Q6H  . levothyroxine  50 mcg Oral QAC breakfast  . pantoprazole (PROTONIX) IV  40 mg Intravenous QHS    Assessment: 59yo female on Heparin bridge while off Xarelto, for DVT/PE in Feb 2017.  Heparin level is within goal range, Hg & pltc stable.  No bleeding noted.  HL this morning is now subtherapeutic at 0.24 on heparin 2000 units/hr. Nurse reports no issues with infusion or bleeding.  Goal of Therapy:  Heparin level 0.3-0.7 units/ml Monitor platelets by anticoagulation protocol: Yes   Plan:  Increase heparin 2250 units/hr 6h HL Daily HL, CBC Watch for s/s of bleeding F/U plans to  resume Xarelto  Andrey Cota. Diona Foley, PharmD, [place] Clinical Pharmacist Pager [phone]

## 2016-02-12 DIAGNOSIS — E860 Dehydration: Secondary | ICD-10-CM

## 2016-02-12 DIAGNOSIS — K219 Gastro-esophageal reflux disease without esophagitis: Secondary | ICD-10-CM

## 2016-02-12 DIAGNOSIS — K352 Acute appendicitis with generalized peritonitis: Secondary | ICD-10-CM

## 2016-02-12 DIAGNOSIS — N179 Acute kidney failure, unspecified: Secondary | ICD-10-CM

## 2016-02-12 LAB — CBC
HEMATOCRIT: 35.9 % — AB (ref 36.0–46.0)
HEMOGLOBIN: 11.1 g/dL — AB (ref 12.0–15.0)
MCH: 27.4 pg (ref 26.0–34.0)
MCHC: 30.9 g/dL (ref 30.0–36.0)
MCV: 88.6 fL (ref 78.0–100.0)
Platelets: 459 10*3/uL — ABNORMAL HIGH (ref 150–400)
RBC: 4.05 MIL/uL (ref 3.87–5.11)
RDW: 14.8 % (ref 11.5–15.5)
WBC: 12.6 10*3/uL — ABNORMAL HIGH (ref 4.0–10.5)

## 2016-02-12 LAB — URINE CULTURE: Culture: NO GROWTH

## 2016-02-12 LAB — HEPARIN LEVEL (UNFRACTIONATED): Heparin Unfractionated: 0.24 IU/mL — ABNORMAL LOW (ref 0.30–0.70)

## 2016-02-12 MED ORDER — RIVAROXABAN 20 MG PO TABS
20.0000 mg | ORAL_TABLET | Freq: Every day | ORAL | Status: DC
  Administered 2016-02-12 – 2016-02-13 (×2): 20 mg via ORAL
  Filled 2016-02-12 (×2): qty 1

## 2016-02-12 MED ORDER — DOCUSATE SODIUM 100 MG PO CAPS
100.0000 mg | ORAL_CAPSULE | Freq: Two times a day (BID) | ORAL | Status: DC
  Administered 2016-02-12 – 2016-02-14 (×5): 100 mg via ORAL
  Filled 2016-02-12 (×5): qty 1

## 2016-02-12 MED ORDER — POLYETHYLENE GLYCOL 3350 17 G PO PACK
17.0000 g | PACK | Freq: Every day | ORAL | Status: DC
  Administered 2016-02-12 – 2016-02-14 (×3): 17 g via ORAL
  Filled 2016-02-12 (×3): qty 1

## 2016-02-12 MED ORDER — PANTOPRAZOLE SODIUM 40 MG PO TBEC
40.0000 mg | DELAYED_RELEASE_TABLET | Freq: Every day | ORAL | Status: DC
Start: 2016-02-12 — End: 2016-02-14
  Administered 2016-02-12 – 2016-02-13 (×3): 40 mg via ORAL
  Filled 2016-02-12 (×3): qty 1

## 2016-02-12 MED ORDER — METRONIDAZOLE 500 MG PO TABS
500.0000 mg | ORAL_TABLET | Freq: Three times a day (TID) | ORAL | Status: DC
  Administered 2016-02-12 – 2016-02-14 (×7): 500 mg via ORAL
  Filled 2016-02-12 (×7): qty 1

## 2016-02-12 MED ORDER — CIPROFLOXACIN HCL 500 MG PO TABS
500.0000 mg | ORAL_TABLET | Freq: Two times a day (BID) | ORAL | Status: DC
  Administered 2016-02-12 – 2016-02-14 (×5): 500 mg via ORAL
  Filled 2016-02-12 (×5): qty 1

## 2016-02-12 NOTE — Progress Notes (Signed)
Micro/gr stain result relayed to Dr Rosendo Gros.

## 2016-02-12 NOTE — Progress Notes (Signed)
Patient ID: Erin Vasquez, female   DOB: [birth year], 60 y.o.   MRN: [id number]     [address] [place], [place] [address]    Phone: [phone] FAX: [phone]     Subjective: Passing flatus, no bm since surgery. Tolerating POs.  Ambulating. Afebrile.  Wbc trending down. Sore llq. Drain 33m purulent output.   Objective:  Vital signs:  Filed Vitals:   02/11/16 1424 02/11/16 1633 02/11/16 2221 02/12/16 0607  BP: 82/68 153/81 161/90 158/84  Pulse: 89  91 83  Temp: 97.8 F (36.6 C)  98.8 F (37.1 C) 98.4 F (36.9 C)  TempSrc: Oral  Oral Oral  Resp: 19     Height:      Weight:      SpO2: 97%  96% 95%    Last BM Date: 02/06/16  Intake/Output   Yesterday:  05/30 0701 - 05/31 0700 In: 1900 [P.O.:1900] Out: 4170 [Urine:4100; Drains:70] This shift:  Total I/O In: 220 [P.O.:220] Out: -     Physical Exam: General: Pt awake/alert/oriented x4 in no acute distress Chest: cta. No chest wall pain w good excursion CV: Pulses intact. Regular rhythm Abdomen: Soft. Nondistended. Mildly tender at incisions only. rlq drain with purulent drainage. No evidence of peritonitis. No incarcerated hernias. Ext: SCDs BLE. No mjr edema. No cyanosis Skin: No petechiae / purpura  Problem List:   Principal Problem:   Ruptured appendicitis Active Problems:   Pulmonary embolism, bilateral (HCC)   Thyroid activity decreased   Hyponatremia   Sepsis, unspecified organism (HCC)   Dehydration   GERD (gastroesophageal reflux disease)   AKI (acute kidney injury) ([place]   Acute perforated appendicitis    Results:   Labs: Results for orders placed or performed during the hospital encounter of 02/06/16 (from the past 48 hour(s))  Heparin level (unfractionated)     Status: Abnormal   Collection Time: 02/11/16  4:50 AM  Result Value Ref Range   Heparin Unfractionated 0.24 (L) 0.30 - 0.70 IU/mL    Comment:         IF HEPARIN RESULTS ARE BELOW EXPECTED VALUES, AND PATIENT DOSAGE HAS BEEN CONFIRMED, SUGGEST FOLLOW UP TESTING OF ANTITHROMBIN III LEVELS.   CBC     Status: Abnormal   Collection Time: 02/11/16  4:50 AM  Result Value Ref Range   WBC 15.4 (H) 4.0 - 10.5 K/uL   RBC 3.66 (L) 3.87 - 5.11 MIL/uL   Hemoglobin 10.1 (L) 12.0 - 15.0 g/dL   HCT 32.9 (L) 36.0 - 46.0 %   MCV 89.9 78.0 - 100.0 fL   MCH 27.6 26.0 - 34.0 pg   MCHC 30.7 30.0 - 36.0 g/dL   RDW 14.9 11.5 - 15.5 %   Platelets 381 150 - 400 K/uL  Basic metabolic panel     Status: Abnormal   Collection Time: 02/11/16  4:50 AM  Result Value Ref Range   Sodium 137 135 - 145 mmol/L   Potassium 4.5 3.5 - 5.1 mmol/L   Chloride 105 101 - 111 mmol/L   CO2 21 (L) 22 - 32 mmol/L   Glucose, Bld 97 65 - 99 mg/dL   BUN <5 (L) 6 - 20 mg/dL   Creatinine, Ser 0.77 0.44 - 1.00 mg/dL   Calcium 8.8 (L) 8.9 - 10.3 mg/dL   GFR calc non Af Amer >60 >60 mL/min   GFR calc Af Amer >60 >60 mL/min  Comment: (NOTE) The eGFR has been calculated using the CKD EPI equation. This calculation has not been validated in all clinical situations. eGFR's persistently <60 mL/min signify possible Chronic Kidney Disease.    Anion gap 11 5 - 15  Heparin level (unfractionated)     Status: None   Collection Time: 02/11/16  1:05 PM  Result Value Ref Range   Heparin Unfractionated 0.58 0.30 - 0.70 IU/mL    Comment:        IF HEPARIN RESULTS ARE BELOW EXPECTED VALUES, AND PATIENT DOSAGE HAS BEEN CONFIRMED, SUGGEST FOLLOW UP TESTING OF ANTITHROMBIN III LEVELS.   Urinalysis, Routine w reflex microscopic (not at [facility])     Status: None   Collection Time: 02/11/16  4:46 PM  Result Value Ref Range   Color, Urine YELLOW YELLOW   APPearance CLEAR CLEAR   Specific Gravity, Urine 1.007 1.005 - 1.030   pH 5.5 5.0 - 8.0   Glucose, UA NEGATIVE NEGATIVE mg/dL   Hgb urine dipstick NEGATIVE NEGATIVE   Bilirubin Urine NEGATIVE NEGATIVE   Ketones, ur NEGATIVE NEGATIVE  mg/dL   Protein, ur NEGATIVE NEGATIVE mg/dL   Nitrite NEGATIVE NEGATIVE   Leukocytes, UA NEGATIVE NEGATIVE    Comment: MICROSCOPIC NOT DONE ON URINES WITH NEGATIVE PROTEIN, BLOOD, LEUKOCYTES, NITRITE, OR GLUCOSE <1000 mg/dL.  Aerobic/Anaerobic Culture(surg specimen) (NOT AT [facility])     Status: None (Preliminary result)   Collection Time: 02/11/16  5:16 PM  Result Value Ref Range   Specimen Description APPENDIX    Special Requests NONE    Gram Stain      ABUNDANT WBC PRESENT, PREDOMINANTLY PMN RARE GRAM NEGATIVE RODS FEW GRAM POSITIVE COCCI IN PAIRS Gram Stain Report Called to,Read Back By and Verified With: Trina Ao RN [phone] 02/11/16 A BROWNING    Culture PENDING    Report Status PENDING   Heparin level (unfractionated)     Status: None   Collection Time: 02/11/16  6:19 PM  Result Value Ref Range   Heparin Unfractionated 0.54 0.30 - 0.70 IU/mL    Comment:        IF HEPARIN RESULTS ARE BELOW EXPECTED VALUES, AND PATIENT DOSAGE HAS BEEN CONFIRMED, SUGGEST FOLLOW UP TESTING OF ANTITHROMBIN III LEVELS.   Heparin level (unfractionated)     Status: Abnormal   Collection Time: 02/12/16  4:57 AM  Result Value Ref Range   Heparin Unfractionated 0.24 (L) 0.30 - 0.70 IU/mL    Comment:        IF HEPARIN RESULTS ARE BELOW EXPECTED VALUES, AND PATIENT DOSAGE HAS BEEN CONFIRMED, SUGGEST FOLLOW UP TESTING OF ANTITHROMBIN III LEVELS.   CBC     Status: Abnormal   Collection Time: 02/12/16  5:01 AM  Result Value Ref Range   WBC 12.6 (H) 4.0 - 10.5 K/uL   RBC 4.05 3.87 - 5.11 MIL/uL   Hemoglobin 11.1 (L) 12.0 - 15.0 g/dL   HCT 35.9 (L) 36.0 - 46.0 %   MCV 88.6 78.0 - 100.0 fL   MCH 27.4 26.0 - 34.0 pg   MCHC 30.9 30.0 - 36.0 g/dL   RDW 14.8 11.5 - 15.5 %   Platelets 459 (H) 150 - 400 K/uL    Imaging / Studies: Dg Chest 2 View  02/11/2016  CLINICAL DATA:  Dyspnea on exertion, leukocytosis, persistent cough EXAM: CHEST  2 VIEW COMPARISON:  02/06/2016 FINDINGS: Calcified granuloma in the  right lower lung. No focal consolidation. No pleural effusion or pneumothorax. Cardiomegaly. Degenerative changes of the visualized thoracolumbar  spine. IMPRESSION: No evidence of acute cardiopulmonary disease. Electronically Signed   By: Julian Hy M.D.   On: 02/11/2016 20:42    Medications / Allergies:  Scheduled Meds: . ciprofloxacin  500 mg Oral BID  . levothyroxine  50 mcg Oral QAC breakfast  . metroNIDAZOLE  500 mg Oral Q8H  . pantoprazole (PROTONIX) IV  40 mg Intravenous QHS  . rivaroxaban  20 mg Oral Q supper   Continuous Infusions: . dextrose 5 % and 0.45% NaCl 30 mL/hr at 02/10/16 1416  . heparin 2,400 Units/hr (02/12/16 0717)   PRN Meds:.diphenhydrAMINE **OR** diphenhydrAMINE, HYDROmorphone (DILAUDID) injection, ondansetron **OR** ondansetron (ZOFRAN) IV, oxyCODONE-acetaminophen  Antibiotics: Anti-infectives    Start     Dose/Rate Route Frequency Ordered Stop   02/12/16 0830  ciprofloxacin (CIPRO) tablet 500 mg     500 mg Oral 2 times daily 02/12/16 0827     02/12/16 0830  metroNIDAZOLE (FLAGYL) tablet 500 mg     500 mg Oral Every 8 hours 02/12/16 0827     02/09/16 1200  imipenem-cilastatin (PRIMAXIN) 500 mg in sodium chloride 0.9 % 100 mL IVPB  Status:  Discontinued     500 mg 200 mL/hr over 30 Minutes Intravenous Every 6 hours 02/09/16 0746 02/12/16 0827   02/07/16 2300  imipenem-cilastatin (PRIMAXIN) 500 mg in sodium chloride 0.9 % 100 mL IVPB  Status:  Discontinued     500 mg 200 mL/hr over 30 Minutes Intravenous Every 6 hours 02/06/16 2157 02/07/16 0832   02/07/16 1800  imipenem-cilastatin (PRIMAXIN) 500 mg in sodium chloride 0.9 % 100 mL IVPB  Status:  Discontinued     500 mg 200 mL/hr over 30 Minutes Intravenous Every 8 hours 02/07/16 1036 02/09/16 0746   02/07/16 1000  imipenem-cilastatin (PRIMAXIN) 500 mg in sodium chloride 0.9 % 100 mL IVPB  Status:  Discontinued     500 mg 200 mL/hr over 30 Minutes Intravenous Every 6 hours 02/07/16 0832 02/07/16 1036    02/06/16 1645  ceFEPIme (MAXIPIME) 2 g in dextrose 5 % 50 mL IVPB     2 g 100 mL/hr over 30 Minutes Intravenous  Once 02/06/16 1641 02/06/16 1740   02/06/16 1645  metroNIDAZOLE (FLAGYL) IVPB 500 mg     500 mg 100 mL/hr over 60 Minutes Intravenous  Once 02/06/16 1641 02/06/16 1823         Assessment/Plan Acute perforated appendicitis POD#5 laparoscopic appendectomy---Dr. Ninfa Linden -tolerating diet, mobilize, IS, pain control -continue drain(purulent drainage) -WBC down, afebrile, transition to PO antibiotics, CBC in AM. Acute renal insufficiency-resolved. Appreciate medicine input DVT/PE-transition to Xarelto Hypothyroidism-home meds Hx endometrioid caner-s/p robotic hysterectomy with BSO and lymph node dissection 01/2014. Followed by Dr. Cindee Salt, followed by Dr. Polly Cobia  LLQ spigelian hernia-observe  ID-primaxin D#5.  cipro/flagyl FEN-soft diet.  Add miralax/colace VTE prophylaxis-scd, heparin Dispo-anticipate DC tomorrow if wbc trends down and no fevers.  DC with drain.    Erby Pian, [facility] Surgery Pager [phone]) For consults and floor pages call [phone](7A-4:30P)  02/12/2016 9:06 AM

## 2016-02-12 NOTE — Progress Notes (Signed)
[facility] for heparin to Xarelto Indication: DVT/PE  Allergies  Allergen Reactions  . Bee Venom Swelling and Other (See Comments)    Patient is allergic to bee venom, mosquitos, wasps, yellow jackets and bees. Wherever she gets stung that area swells, turns hot and has fevers.    . Other Anaphylaxis and Shortness Of Breath    Hickory smoke  . Food Other (See Comments)    Tangerines. Unknown childhood allergy.   . Morphine And Related Itching  . Penicillins Other (See Comments)    UNKNOWN: CHILDHOOD ALLERGY     Patient Measurements: Height: 5\' 3"  (160 cm) Weight: 286 lb 13.1 oz (130.1 kg) IBW/kg (Calculated) : 52.4  Heparin Dosing Weight: 85 kg   Vital Signs: Temp: 98.4 F (36.9 C) (05/31 0607) Temp Source: Oral (05/31 0607) BP: 158/84 mmHg (05/31 0607) Pulse Rate: 83 (05/31 0607)  Labs:  Recent Labs  02/10/16 0427 02/11/16 0450 02/11/16 1305 02/11/16 1819 02/12/16 0457 02/12/16 0501  HGB 10.5* 10.1*  --   --   --  11.1*  HCT 34.3* 32.9*  --   --   --  35.9*  PLT 364 381  --   --   --  459*  HEPARINUNFRC 0.42 0.24* 0.58 0.54 0.24*  --   CREATININE 0.86 0.77  --   --   --   --     Estimated Creatinine Clearance: 98.6 mL/min (by C-G formula based on Cr of 0.77).   Medications:  Scheduled:  . ciprofloxacin  500 mg Oral BID  . levothyroxine  50 mcg Oral QAC breakfast  . metroNIDAZOLE  500 mg Oral Q8H  . pantoprazole (PROTONIX) IV  40 mg Intravenous QHS    Assessment: 60 yo female with history of LLE DVT/PE on PTA rivaroxaban. Transitioned to heparin while inpatient and now okayed by surgery to restart Xarelto. Hgb stable but low at 10.1, plts wnl. SCr stable, CrCl ~135ml/min. No issues noted.  Goal of Therapy:  Heparin level 0.3-0.7 units/ml Monitor platelets by anticoagulation protocol: Yes   Plan: Stop heparin gtt once first dose of Xarelto given Restart Xarelto 20mg  PO daily (Give first dose at lunch today) Monitor  CBC, s/s of bleed  Elenor Quinones, PharmD, [facility] Clinical Pharmacist Pager [phone] 02/12/2016 8:43 AM

## 2016-02-12 NOTE — Discharge Summary (Signed)
Physician Discharge Summary  Patient ID: Erin Vasquez MRN: [id number] DOB/AGE: Sep 10, 1956 60 y.o.  Admit date: 02/06/2016 Discharge date: 02/14/2016  Admission Diagnoses:  Acute perforated appendicitis Mild renal insuffiencey  LLQ Spigelian hernia Bilateral pulmonary emboli on Xarelto Lupus anticoagulant positive hyponatremia Hypothyroid Hx of uterine cancer Sleep apnea, cannot afford CPAP Chronic back pain GERD/hiatal hernia  Discharge Diagnoses:  Perforated appendicitis with abscess Sepsis Mild renal insuffiencey  LLQ Spigelian hernia Bilateral pulmonary emboli on Xarelto Hypothyroid Hx of uterine cancer Sleep apnea, cannot afford CPAP Chronic back pain GERD/hiatal hernia  Principal Problem:   Ruptured appendicitis Active Problems:   Pulmonary embolism, bilateral (HCC)   Thyroid activity decreased   Hyponatremia   Sepsis, unspecified organism (HCC)   Dehydration   GERD (gastroesophageal reflux disease)   AKI (acute kidney injury) ([place])   Acute perforated appendicitis   PROCEDURES: Laparoscopic appendectomy and drain placement, 02/07/16, Dr. Michaelyn Barter Course:  Erin Vasquez presented to the emergency department complaining of lower abdominal pain and fever. This is been going on for 10 days. She has had associated nausea and vomiting. She claims she has lost 17 pounds. She tried to go to her primary care physician yesterday but was directed to urgent care. Once she was seen there she was advised to come to the emergency department. Additionally, she was admitted in February of this year with left lower extremity DVT and bilateral PE. She is currently on Xarelto. She has been having some ongoing shortness of breath at home. Last month, she felt she strained her abdominal wall pulling weeds. That led to some pain in the left lower quadrant. Workup in the emergency department today includes lab work demonstrating leukocytosis of 23,300. CT scan of the abdomen and  pelvis shows likely perforated appendicitis as well as a fat-containing spigelian hernia in the left lower quadrant. I was asked to see her for management. She was seen in the ED and admitted.  She was placed on IV antibiotics, and hydration.  Medicine consult was also obtained. She was taken to the OR on 5/26 and underwent appendectomy by Dr. Ninfa Linden, findings included an abscess that originated from the perforation.   Post op she has done well.  Her diet was advanced but she developed nausea first morning on oral antibiotics.  She did well the rest of the day, on full liquids.  The following AM she was fine, but started to get sick again with AM dose of antibiotic.  She got some Zofran and was fine and ready for discharge.  She was seen by Dr. Redmond Pulling and we plan discharge today.  One more week of antibiotics, Cipro and Flagyl, with one refill available.  She will see Dr. Ninfa Linden next week.  Drain still has a serous with thicker white colored exudate in it.    Condition on d/c:  Improved    CBC Latest Ref Rng 02/13/2016 02/12/2016 02/11/2016  WBC 4.0 - 10.5 K/uL 9.7 12.6(H) 15.4(H)  Hemoglobin 12.0 - 15.0 g/dL 10.7(L) 11.1(L) 10.1(L)  Hematocrit 36.0 - 46.0 % 34.6(L) 35.9(L) 32.9(L)  Platelets 150 - 400 K/uL 422(H) 459(H) 381   CMP Latest Ref Rng 02/11/2016 02/10/2016 02/09/2016  Glucose 65 - 99 mg/dL 97 99 99  BUN 6 - 20 mg/dL <5(L) 7 16  Creatinine 0.44 - 1.00 mg/dL 0.77 0.86 1.01(H)  Sodium 135 - 145 mmol/L 137 138 136  Potassium 3.5 - 5.1 mmol/L 4.5 4.8 4.8  Chloride 101 - 111 mmol/L 105 108 106  CO2 22 - 32 mmol/L 21(L) 22 23  Calcium 8.9 - 10.3 mg/dL 8.8(L) 8.5(L) 8.4(L)  Total Protein 6.5 - 8.1 g/dL - - -  Total Bilirubin 0.3 - 1.2 mg/dL - - -  Alkaline Phos 38 - 126 U/L - - -  AST 15 - 41 U/L - - -  ALT 14 - 54 U/L - - -    Disposition: 01-Home or Self Care     Medication List    TAKE these medications        ciprofloxacin 500 MG tablet  Commonly known as:  CIPRO  Take 1  tablet (500 mg total) by mouth 2 (two) times daily.     clobetasol cream 0.05 %  Commonly known as:  TEMOVATE  Apply 1 application topically as directed.     levothyroxine 50 MCG tablet  Commonly known as:  SYNTHROID, LEVOTHROID  Take 50 mcg by mouth daily before breakfast.     meloxicam 15 MG tablet  Commonly known as:  MOBIC  Take 15 mg by mouth daily.     metroNIDAZOLE 500 MG tablet  Commonly known as:  FLAGYL  Take 1 tablet (500 mg total) by mouth every 8 (eight) hours.     ondansetron 4 MG disintegrating tablet  Commonly known as:  ZOFRAN-ODT  Take 1 tablet (4 mg total) by mouth every 6 (six) hours as needed for nausea.     oxyCODONE-acetaminophen 5-325 MG tablet  Commonly known as:  PERCOCET/ROXICET  Take 1-2 tablets by mouth every 4 (four) hours as needed for moderate pain.     ranitidine 150 MG tablet  Commonly known as:  ZANTAC  Take 150 mg by mouth 2 (two) times daily.     rivaroxaban 20 MG Tabs tablet  Commonly known as:  XARELTO  Take 20 mg by mouth daily with supper.     Vitamin D3 5000 units Tabs  Take 1 tablet by mouth daily.       Follow-up Information    Follow up with Harl Bowie, MD On 02/19/2016.   Specialty:  General Surgery   Why:  Your appointment is at 3 PM, be at the office 30 minutes early for check in.    Contact information:   [place] [address] [phone]       Follow up with Barrie Lyme, FNP.   Specialty:  Nurse Practitioner   Why:  Call and follow up for other medical issues.   Contact information:   [address] [place] [address] [phone]       Signed: Earnstine Regal 02/14/2016, 11:34 AM   Leighton Ruff. Redmond Pulling, MD, FACS General, Bariatric, & Minimally Invasive Surgery [facility] Surgery, [place]

## 2016-02-12 NOTE — Progress Notes (Signed)
[place] for heparin Indication: DVT/PE  Allergies  Allergen Reactions  . Bee Venom Swelling and Other (See Comments)    Patient is allergic to bee venom, mosquitos, wasps, yellow jackets and bees. Wherever she gets stung that area swells, turns hot and has fevers.    . Other Anaphylaxis and Shortness Of Breath    Hickory smoke  . Food Other (See Comments)    Tangerines. Unknown childhood allergy.   . Morphine And Related Itching  . Penicillins Other (See Comments)    UNKNOWN: CHILDHOOD ALLERGY     Patient Measurements: Height: 5\' 3"  (160 cm) Weight: 286 lb 13.1 oz (130.1 kg) IBW/kg (Calculated) : 52.4  Heparin Dosing Weight: 85 kg   Vital Signs: Temp: 98.4 F (36.9 C) (05/31 0607) Temp Source: Oral (05/31 0607) BP: 158/84 mmHg (05/31 0607) Pulse Rate: 83 (05/31 0607)  Labs:  Recent Labs  02/10/16 0427 02/11/16 0450 02/11/16 1305 02/11/16 1819 02/12/16 0457 02/12/16 0501  HGB 10.5* 10.1*  --   --   --  11.1*  HCT 34.3* 32.9*  --   --   --  35.9*  PLT 364 381  --   --   --  459*  HEPARINUNFRC 0.42 0.24* 0.58 0.54 0.24*  --   CREATININE 0.86 0.77  --   --   --   --     Estimated Creatinine Clearance: 98.6 mL/min (by C-G formula based on Cr of 0.77).   Medications:  Scheduled:  . imipenem-cilastatin  500 mg Intravenous Q6H  . levothyroxine  50 mcg Oral QAC breakfast  . pantoprazole (PROTONIX) IV  40 mg Intravenous QHS    Assessment: 60 yo female with history of LLE DVT/PE, Xarelto on hold, for heparin   Goal of Therapy:  Heparin level 0.3-0.7 units/ml Monitor platelets by anticoagulation protocol: Yes   Plan:  Increase Heparin 2400 units/hr Check heparin level in 8 hours.  Phillis Knack, PharmD, BCPS 02/12/2016 7:08 AM

## 2016-02-12 NOTE — Progress Notes (Addendum)
PROGRESS NOTE    Erin Vasquez  [id number] DOB: 1956-06-23 DOA: 02/06/2016 PCP: Barrie Lyme, FNP    Brief Narrative:  60 year old female with a history of DVT/PE and underlying lupus anticoagulant, hypothyroidism, GERD with Schatzki's ring presented with 10 day history of abdominal pain, fevers, chills. The patient was diagnosed with bilateral pulmonary emboli on 10/29/2015 with DVT of the left lower extremity during that time. The patient was started on rivaroxaban. Hematologic evaluation revealed lupus anticoagulant. She has been compliant with the medications. Because of her worsening abdominal pain she presented to emergency Department. CT of the abdomen and pelvis revealed right lower quadrant diffuse inflammation with extraluminal fluid at the cecal tip consistent with perforated appendicitis. The patient was initially started on cefepime and metronidazole. This was switched to imipenem. Initial WBC 23.3 with lactic acid 1.7. The patient was admitted under the surgical team, and internal medicine consultation was obtained for medical management. The patient was taken to surgery for laparoscopic appendectomy on 02/07/2016. Postoperatively, the patient was placed on intravenous heparin. Transitioned back to Xarelto today.  Assessment & Plan   Perforated appendicitis -02/07/2016--lap appendectomy--Dr. Ninfa Linden -Was placed on imipenem, transitioned to cipro/flagyl today -Lactic acid 1.7, procalcitonin 1.88-->0.83 -Follow blood cultures--neg to date -increase activity-->ambulate hall -diet advancement per general surgery -WBC trending downward today  Bilateral PE/left lower extremity DVT--hypercoagulable state -Diagnosed 10/29/2015 -Further workup revealed lupus anticoagulant -Last dose rivaroxaban 02/05/2016 at 1930 -heparin drip discontinued today, transitioned back to Xarelto  Leukocytosis -as above -repeat UA--no pyuria -5/31 CXR--no infiltrates -treatment and plan  as above  Acute kidney injury -Improved, Likely secondary to hemodynamic changes and infectious process -Serum creatinine increased from 0.38--> 1.43-->1.01-->0.86 -The patient did receive intravenous dye on 02/06/2016  Elevated BP -no formal dx of HTN -?related to pain -monitor off meds for now  Hyponatremia -Resolved, Secondary to volume depletion/dehydration  Hypothyroidism -Continue levothyroxine  GERD -Continue PPI -November 2014 EGD revealed Schatzki's ring which was dilated  DVT Prophylaxis  Xarelto  Code Status: Full  Family Communication: Friend at bedside  Disposition Plan: Admitted, dispo per primary-CCS  Consultants TRH  Procedures  Lap Appendectomy   Antibiotics   Anti-infectives    Start     Dose/Rate Route Frequency Ordered Stop   02/12/16 0830  ciprofloxacin (CIPRO) tablet 500 mg     500 mg Oral 2 times daily 02/12/16 0827     02/12/16 0830  metroNIDAZOLE (FLAGYL) tablet 500 mg     500 mg Oral Every 8 hours 02/12/16 0827     02/09/16 1200  imipenem-cilastatin (PRIMAXIN) 500 mg in sodium chloride 0.9 % 100 mL IVPB  Status:  Discontinued     500 mg 200 mL/hr over 30 Minutes Intravenous Every 6 hours 02/09/16 0746 02/12/16 0827   02/07/16 2300  imipenem-cilastatin (PRIMAXIN) 500 mg in sodium chloride 0.9 % 100 mL IVPB  Status:  Discontinued     500 mg 200 mL/hr over 30 Minutes Intravenous Every 6 hours 02/06/16 2157 02/07/16 0832   02/07/16 1800  imipenem-cilastatin (PRIMAXIN) 500 mg in sodium chloride 0.9 % 100 mL IVPB  Status:  Discontinued     500 mg 200 mL/hr over 30 Minutes Intravenous Every 8 hours 02/07/16 1036 02/09/16 0746   02/07/16 1000  imipenem-cilastatin (PRIMAXIN) 500 mg in sodium chloride 0.9 % 100 mL IVPB  Status:  Discontinued     500 mg 200 mL/hr over 30 Minutes Intravenous Every 6 hours 02/07/16 0832 02/07/16 1036   02/06/16 1645  ceFEPIme (  MAXIPIME) 2 g in dextrose 5 % 50 mL IVPB     2 g 100 mL/hr over 30 Minutes Intravenous   Once 02/06/16 1641 02/06/16 1740   02/06/16 1645  metroNIDAZOLE (FLAGYL) IVPB 500 mg     500 mg 100 mL/hr over 60 Minutes Intravenous  Once 02/06/16 1641 02/06/16 1823      Subjective:   Erin Vasquez seen and examined today. Patient is feeling better today. Still feels abdominal pain in the LLQ.  Denies Chest pain, shortness of breath, nausea, vomiting, diarrhea.  Patient has not had a bowel movement but does endorse gas.   Objective:   Filed Vitals:   02/11/16 1424 02/11/16 1633 02/11/16 2221 02/12/16 0607  BP: 82/68 153/81 161/90 158/84  Pulse: 89  91 83  Temp: 97.8 F (36.6 C)  98.8 F (37.1 C) 98.4 F (36.9 C)  TempSrc: Oral  Oral Oral  Resp: 19     Height:      Weight:      SpO2: 97%  96% 95%    Intake/Output Summary (Last 24 hours) at 02/12/16 1316 Last data filed at 02/12/16 1155  Gross per 24 hour  Intake 1757.58 ml  Output   2880 ml  Net -1122.42 ml   Filed Weights   02/06/16 1616 02/06/16 2250  Weight: 130.182 kg (287 lb) 130.1 kg (286 lb 13.1 oz)    Exam  General: Well developed, well nourished, NAD, appears stated age  83: NCAT, mucous membranes moist.   Cardiovascular: S1 S2 auscultated, no murmurs, RRR  Respiratory: Clear to auscultation bilaterally   Abdomen: Soft, obese, nontender, nondistended, + bowel sounds, +drain  Extremities: warm dry without cyanosis clubbing or edema  Neuro: AAOx3, nonfocal  Psych: Normal affect and demeanor   Data Reviewed: I have personally reviewed following labs and imaging studies  CBC:  Recent Labs Lab 02/06/16 1223  02/08/16 0520 02/09/16 0542 02/10/16 0427 02/11/16 0450 02/12/16 0501  WBC 23.3*  < > 16.3* 14.7* 14.3* 15.4* 12.6*  NEUTROABS 21.9*  --   --   --   --   --   --   HGB 12.8  < > 10.3* 10.5* 10.5* 10.1* 11.1*  HCT 38.3  < > 33.3* 34.1* 34.3* 32.9* 35.9*  MCV 86.7  < > 89.0 90.2 89.6 89.9 88.6  PLT 380  < > 348 347 364 381 459*  < > = values in this interval not displayed. Basic  Metabolic Panel:  Recent Labs Lab 02/07/16 0001 02/08/16 0520 02/09/16 0542 02/10/16 0427 02/11/16 0450  NA 135 134* 136 138 137  K 3.7 4.7 4.8 4.8 4.5  CL 102 104 106 108 105  CO2 20* 21* 23 22 21*  GLUCOSE 99 146* 99 99 97  BUN 27* 22* 16 7 <5*  CREATININE 1.43* 1.07* 1.01* 0.86 0.77  CALCIUM 8.2* 8.2* 8.4* 8.5* 8.8*   GFR: Estimated Creatinine Clearance: 98.6 mL/min (by C-G formula based on Cr of 0.77). Liver Function Tests:  Recent Labs Lab 02/06/16 1223  AST 27  ALT 36  ALKPHOS 105  BILITOT 0.5  PROT 7.6  ALBUMIN 2.9*   No results for input(s): LIPASE, AMYLASE in the last 168 hours. No results for input(s): AMMONIA in the last 168 hours. Coagulation Profile:  Recent Labs Lab 02/07/16 0001  INR 1.84*   Cardiac Enzymes: No results for input(s): CKTOTAL, CKMB, CKMBINDEX, TROPONINI in the last 168 hours. BNP (last 3 results) No results for input(s): PROBNP in the last  8760 hours. HbA1C: No results for input(s): HGBA1C in the last 72 hours. CBG: No results for input(s): GLUCAP in the last 168 hours. Lipid Profile: No results for input(s): CHOL, HDL, LDLCALC, TRIG, CHOLHDL, LDLDIRECT in the last 72 hours. Thyroid Function Tests: No results for input(s): TSH, T4TOTAL, FREET4, T3FREE, THYROIDAB in the last 72 hours. Anemia Panel: No results for input(s): VITAMINB12, FOLATE, FERRITIN, TIBC, IRON, RETICCTPCT in the last 72 hours. Urine analysis:    Component Value Date/Time   COLORURINE YELLOW 02/11/2016 [place] 02/11/2016 1646   LABSPEC 1.007 02/11/2016 1646   PHURINE 5.5 02/11/2016 1646   GLUCOSEU NEGATIVE 02/11/2016 1646   HGBUR NEGATIVE 02/11/2016 1646   BILIRUBINUR NEGATIVE 02/11/2016 1646   KETONESUR NEGATIVE 02/11/2016 1646   PROTEINUR NEGATIVE 02/11/2016 1646   NITRITE NEGATIVE 02/11/2016 1646   LEUKOCYTESUR NEGATIVE 02/11/2016 1646   Sepsis Labs: @LABRCNTIP (procalcitonin:4,lacticidven:4)  ) Recent Results (from the past  240 hour(s))  Culture, blood (routine x 2)     Status: None   Collection Time: 02/06/16  8:39 PM  Result Value Ref Range Status   Specimen Description BLOOD LEFT HAND  Final   Special Requests BOTTLES DRAWN AEROBIC ONLY 4CC  Final   Culture NO GROWTH 5 DAYS  Final   Report Status 02/11/2016 FINAL  Final  Culture, blood (routine x 2)     Status: None   Collection Time: 02/06/16  8:47 PM  Result Value Ref Range Status   Specimen Description BLOOD LEFT HAND  Final   Special Requests BOTTLES DRAWN AEROBIC ONLY 4CC  Final   Culture NO GROWTH 5 DAYS  Final   Report Status 02/11/2016 FINAL  Final  MRSA PCR Screening     Status: None   Collection Time: 02/06/16 10:39 PM  Result Value Ref Range Status   MRSA by PCR NEGATIVE NEGATIVE Final    Comment:        The GeneXpert MRSA Assay (FDA approved for NASAL specimens only), is one component of a comprehensive MRSA colonization surveillance program. It is not intended to diagnose MRSA infection nor to guide or monitor treatment for MRSA infections.   Aerobic/Anaerobic Culture(surg specimen) (NOT AT [facility])     Status: None (Preliminary result)   Collection Time: 02/11/16  5:16 PM  Result Value Ref Range Status   Specimen Description APPENDIX  Final   Special Requests NONE  Final   Gram Stain   Final    ABUNDANT WBC PRESENT, PREDOMINANTLY PMN RARE GRAM NEGATIVE RODS FEW GRAM POSITIVE COCCI IN PAIRS Gram Stain Report Called to,Read Back By and Verified With: Trina Ao RN [id number] 02/11/16 A BROWNING    Culture CULTURE REINCUBATED FOR BETTER GROWTH  Final   Report Status PENDING  Incomplete      Radiology Studies: Dg Chest 2 View  02/11/2016  CLINICAL DATA:  Dyspnea on exertion, leukocytosis, persistent cough EXAM: CHEST  2 VIEW COMPARISON:  02/06/2016 FINDINGS: Calcified granuloma in the right lower lung. No focal consolidation. No pleural effusion or pneumothorax. Cardiomegaly. Degenerative changes of the visualized thoracolumbar spine.  IMPRESSION: No evidence of acute cardiopulmonary disease. Electronically Signed   By: Julian Hy M.D.   On: 02/11/2016 20:42     Scheduled Meds: . ciprofloxacin  500 mg Oral BID  . docusate sodium  100 mg Oral BID  . levothyroxine  50 mcg Oral QAC breakfast  . metroNIDAZOLE  500 mg Oral Q8H  . pantoprazole  40 mg Oral QHS  . polyethylene  glycol  17 g Oral Daily  . rivaroxaban  20 mg Oral Q supper   Continuous Infusions: . dextrose 5 % and 0.45% NaCl 30 mL/hr at 02/10/16 1416     LOS: 6 days   Time Spent in minutes   30 minutes  Argelio Granier D.O. on 02/12/2016 at 1:16 PM  Between 7am to 7pm - Pager - [phone]  After 7pm go to [email or web address] - password [email or web address]  And look for the night coverage person covering for me after hours  [facility] Office  [phone]

## 2016-02-13 ENCOUNTER — Inpatient Hospital Stay (HOSPITAL_COMMUNITY): Payer: Medicaid Other

## 2016-02-13 DIAGNOSIS — E871 Hypo-osmolality and hyponatremia: Secondary | ICD-10-CM

## 2016-02-13 LAB — CBC
HCT: 34.6 % — ABNORMAL LOW (ref 36.0–46.0)
HEMOGLOBIN: 10.7 g/dL — AB (ref 12.0–15.0)
MCH: 27.4 pg (ref 26.0–34.0)
MCHC: 30.9 g/dL (ref 30.0–36.0)
MCV: 88.7 fL (ref 78.0–100.0)
Platelets: 422 10*3/uL — ABNORMAL HIGH (ref 150–400)
RBC: 3.9 MIL/uL (ref 3.87–5.11)
RDW: 14.7 % (ref 11.5–15.5)
WBC: 9.7 10*3/uL (ref 4.0–10.5)

## 2016-02-13 NOTE — Progress Notes (Signed)
6 Days Post-Op  Subjective: She looks fine but vomited this AM with breakfast.  She is on the oral Flagyl and Cipro.  She feels pretty good otherwise.    Objective: Vital signs in last 24 hours: Temp:  [97.4 F (36.3 C)-98.5 F (36.9 C)] 98.5 F (36.9 C) (06/01 0525) Pulse Rate:  [88-93] 89 (06/01 0525) Resp:  [18-20] 18 (06/01 0525) BP: (153-178)/(81-97) 164/85 mmHg (06/01 0525) SpO2:  [95 %-96 %] 95 % (06/01 0525) Last BM Date: 02/12/16 Urine 3465 Drain 115 Afebrile, VSS WBC improving Intake/Output from previous day: 05/31 0701 - 06/01 0700 In: 1517.6 [P.O.:1400; I.V.:117.6] Out: [id number] [Urine:3350; Drains:115] Intake/Output this shift: Total I/O In: 360 [P.O.:360] Out: 200 [Urine:200]  General appearance: alert, cooperative and no distress Resp: clear to auscultation bilaterally GI: soft, sore, sites look fine, her drain still has serous/thick white colored drainage  Lab Results:   Recent Labs  02/12/16 0501 02/13/16 0240  WBC 12.6* 9.7  HGB 11.1* 10.7*  HCT 35.9* 34.6*  PLT 459* 422*    BMET  Recent Labs  02/11/16 0450  NA 137  K 4.5  CL 105  CO2 21*  GLUCOSE 97  BUN <5*  CREATININE 0.77  CALCIUM 8.8*   PT/INR No results for input(s): LABPROT, INR in the last 72 hours.   Recent Labs Lab 02/06/16 1223  AST 27  ALT 36  ALKPHOS 105  BILITOT 0.5  PROT 7.6  ALBUMIN 2.9*     Lipase  No results found for: LIPASE   Studies/Results: Dg Chest 2 View  02/11/2016  CLINICAL DATA:  Dyspnea on exertion, leukocytosis, persistent cough EXAM: CHEST  2 VIEW COMPARISON:  02/06/2016 FINDINGS: Calcified granuloma in the right lower lung. No focal consolidation. No pleural effusion or pneumothorax. Cardiomegaly. Degenerative changes of the visualized thoracolumbar spine. IMPRESSION: No evidence of acute cardiopulmonary disease. Electronically Signed   By: Julian Hy M.D.   On: 02/11/2016 20:42    Medications: . ciprofloxacin  500 mg Oral BID  .  docusate sodium  100 mg Oral BID  . levothyroxine  50 mcg Oral QAC breakfast  . metroNIDAZOLE  500 mg Oral Q8H  . pantoprazole  40 mg Oral QHS  . polyethylene glycol  17 g Oral Daily  . rivaroxaban  20 mg Oral Q supper   . dextrose 5 % and 0.45% NaCl Stopped (02/12/16 0730)   Assessment/Plan Acute appendicitis with perforation Laparoscopic appendectomy, 02/08/16, Dr. Ninfa Linden Bilateral PE LLE/Hypercoagulable state on Xarelto Acute kidney injury Hypertension Hypothyroid FEN:  Soft diet/ ID:  Day 8 antibiotics Currently flagyl/Cipro DVT: rivaroxaban    Plan:  She would like to go home, but I don't want to send her till she is OK with current meds.  I told her to get up and around, see how she does with lunch.  If this is a one time thing maybe she can go later.     LOS: 7 days    Umberto Pavek 02/13/2016 [phone]

## 2016-02-13 NOTE — Progress Notes (Signed)
PROGRESS NOTE    Erin Vasquez  [id number] DOB: 11-28-1955 DOA: 02/06/2016 PCP: Barrie Lyme, FNP    Brief Narrative:  60 year old female with a history of DVT/PE and underlying lupus anticoagulant, hypothyroidism, GERD with Schatzki's ring presented with 10 day history of abdominal pain, fevers, chills. The patient was diagnosed with bilateral pulmonary emboli on 10/29/2015 with DVT of the left lower extremity during that time. The patient was started on rivaroxaban. Hematologic evaluation revealed lupus anticoagulant. She has been compliant with the medications. Because of her worsening abdominal pain she presented to emergency Department. CT of the abdomen and pelvis revealed right lower quadrant diffuse inflammation with extraluminal fluid at the cecal tip consistent with perforated appendicitis. The patient was initially started on cefepime and metronidazole. This was switched to imipenem. Initial WBC 23.3 with lactic acid 1.7. The patient was admitted under the surgical team, and internal medicine consultation was obtained for medical management. The patient was taken to surgery for laparoscopic appendectomy on 02/07/2016. Postoperatively, the patient was placed on intravenous heparin. Transitioned back to Xarelto today.  Assessment & Plan   Perforated appendicitis -02/07/2016--lap appendectomy--Dr. Ninfa Linden -Was placed on imipenem, transitioned to cipro/flagyl on 02/12/16 -Lactic acid 1.7, procalcitonin 1.88-->0.83 -Follow blood cultures--neg to date -increase activity -WBC normalized  Bilateral PE/left lower extremity DVT--hypercoagulable state -Diagnosed 10/29/2015 -Further workup revealed lupus anticoagulant -Last dose rivaroxaban 02/05/2016 at 1930 -heparin drip discontinued 5/31, transitioned back to Xarelto -Continue Xarelto 20mg  daily  Leukocytosis -Resolved -repeat UA--no pyuria -5/31 CXR--no infiltrates -treatment and plan as above  Acute kidney  injury -Improved, Likely secondary to hemodynamic changes and infectious process -Serum creatinine increased from 0.38--> 1.43-->1.01-->0.77 -The patient did receive intravenous dye on 02/06/2016  Elevated BP -no formal dx of HTN -?related to pain  Hyponatremia -Resolved, Secondary to volume depletion/dehydration  Hypothyroidism -Continue levothyroxine  GERD -Continue PPI -November 2014 EGD revealed Schatzki's ring which was dilated  DVT Prophylaxis  Xarelto  Code Status: Full  Family Communication: None at bedside  Disposition Plan: Admitted, dispo per primary-CCS. Patient stable from medicine standpoint for discharge. Will sign off.   Consultants TRH  Procedures  Lap Appendectomy   Antibiotics   Anti-infectives    Start     Dose/Rate Route Frequency Ordered Stop   02/12/16 0830  ciprofloxacin (CIPRO) tablet 500 mg     500 mg Oral 2 times daily 02/12/16 0827     02/12/16 0830  metroNIDAZOLE (FLAGYL) tablet 500 mg     500 mg Oral Every 8 hours 02/12/16 0827     02/09/16 1200  imipenem-cilastatin (PRIMAXIN) 500 mg in sodium chloride 0.9 % 100 mL IVPB  Status:  Discontinued     500 mg 200 mL/hr over 30 Minutes Intravenous Every 6 hours 02/09/16 0746 02/12/16 0827   02/07/16 2300  imipenem-cilastatin (PRIMAXIN) 500 mg in sodium chloride 0.9 % 100 mL IVPB  Status:  Discontinued     500 mg 200 mL/hr over 30 Minutes Intravenous Every 6 hours 02/06/16 2157 02/07/16 0832   02/07/16 1800  imipenem-cilastatin (PRIMAXIN) 500 mg in sodium chloride 0.9 % 100 mL IVPB  Status:  Discontinued     500 mg 200 mL/hr over 30 Minutes Intravenous Every 8 hours 02/07/16 1036 02/09/16 0746   02/07/16 1000  imipenem-cilastatin (PRIMAXIN) 500 mg in sodium chloride 0.9 % 100 mL IVPB  Status:  Discontinued     500 mg 200 mL/hr over 30 Minutes Intravenous Every 6 hours 02/07/16 0832 02/07/16 1036   02/06/16 1645  ceFEPIme (MAXIPIME) 2 g in dextrose 5 % 50 mL IVPB     2 g 100 mL/hr over 30  Minutes Intravenous  Once 02/06/16 1641 02/06/16 1740   02/06/16 1645  metroNIDAZOLE (FLAGYL) IVPB 500 mg     500 mg 100 mL/hr over 60 Minutes Intravenous  Once 02/06/16 1641 02/06/16 1823      Subjective:   Elie Confer seen and examined today. Patient is feeling better today and ready to go home.  Denies Chest pain, shortness of breath, nausea, vomiting, diarrhea.  Stated she had a very small bowel movement yesterday.   Objective:   Filed Vitals:   02/12/16 2223 02/13/16 0200 02/13/16 0240 02/13/16 0525  BP: 178/86 178/86 153/81 164/85  Pulse: 93   89  Temp: 98.4 F (36.9 C)   98.5 F (36.9 C)  TempSrc: Axillary   Oral  Resp: 19   18  Height:      Weight:      SpO2: 95%   95%    Intake/Output Summary (Last 24 hours) at 02/13/16 0835 Last data filed at 02/13/16 0525  Gross per 24 hour  Intake   1494 ml  Output   3465 ml  Net  -1971 ml   Filed Weights   02/06/16 1616 02/06/16 2250  Weight: 130.182 kg (287 lb) 130.1 kg (286 lb 13.1 oz)    Exam  General: Well developed, well nourished, NAD  HEENT: NCAT, mucous membranes moist.   Cardiovascular: S1 S2 auscultated, no murmurs, RRR  Respiratory: Clear to auscultation bilaterally   Abdomen: Soft, obese, nontender, nondistended, + bowel sounds, +drain  Extremities: warm dry without cyanosis clubbing or edema  Neuro: AAOx3, nonfocal  Psych: Normal affect and demeanor, pleasant   Data Reviewed: I have personally reviewed following labs and imaging studies  CBC:  Recent Labs Lab 02/06/16 1223  02/09/16 0542 02/10/16 0427 02/11/16 0450 02/12/16 0501 02/13/16 0240  WBC 23.3*  < > 14.7* 14.3* 15.4* 12.6* 9.7  NEUTROABS 21.9*  --   --   --   --   --   --   HGB 12.8  < > 10.5* 10.5* 10.1* 11.1* 10.7*  HCT 38.3  < > 34.1* 34.3* 32.9* 35.9* 34.6*  MCV 86.7  < > 90.2 89.6 89.9 88.6 88.7  PLT 380  < > 347 364 381 459* 422*  < > = values in this interval not displayed. Basic Metabolic Panel:  Recent  Labs Lab 02/07/16 0001 02/08/16 0520 02/09/16 0542 02/10/16 0427 02/11/16 0450  NA 135 134* 136 138 137  K 3.7 4.7 4.8 4.8 4.5  CL 102 104 106 108 105  CO2 20* 21* 23 22 21*  GLUCOSE 99 146* 99 99 97  BUN 27* 22* 16 7 <5*  CREATININE 1.43* 1.07* 1.01* 0.86 0.77  CALCIUM 8.2* 8.2* 8.4* 8.5* 8.8*   GFR: Estimated Creatinine Clearance: 98.6 mL/min (by C-G formula based on Cr of 0.77). Liver Function Tests:  Recent Labs Lab 02/06/16 1223  AST 27  ALT 36  ALKPHOS 105  BILITOT 0.5  PROT 7.6  ALBUMIN 2.9*   No results for input(s): LIPASE, AMYLASE in the last 168 hours. No results for input(s): AMMONIA in the last 168 hours. Coagulation Profile:  Recent Labs Lab 02/07/16 0001  INR 1.84*   Cardiac Enzymes: No results for input(s): CKTOTAL, CKMB, CKMBINDEX, TROPONINI in the last 168 hours. BNP (last 3 results) No results for input(s): PROBNP in the last 8760 hours. HbA1C: No results  for input(s): HGBA1C in the last 72 hours. CBG: No results for input(s): GLUCAP in the last 168 hours. Lipid Profile: No results for input(s): CHOL, HDL, LDLCALC, TRIG, CHOLHDL, LDLDIRECT in the last 72 hours. Thyroid Function Tests: No results for input(s): TSH, T4TOTAL, FREET4, T3FREE, THYROIDAB in the last 72 hours. Anemia Panel: No results for input(s): VITAMINB12, FOLATE, FERRITIN, TIBC, IRON, RETICCTPCT in the last 72 hours. Urine analysis:    Component Value Date/Time   COLORURINE YELLOW 02/11/2016 [place] 02/11/2016 1646   LABSPEC 1.007 02/11/2016 1646   PHURINE 5.5 02/11/2016 1646   GLUCOSEU NEGATIVE 02/11/2016 1646   HGBUR NEGATIVE 02/11/2016 1646   BILIRUBINUR NEGATIVE 02/11/2016 1646   KETONESUR NEGATIVE 02/11/2016 1646   PROTEINUR NEGATIVE 02/11/2016 1646   NITRITE NEGATIVE 02/11/2016 1646   LEUKOCYTESUR NEGATIVE 02/11/2016 1646   Sepsis Labs: @LABRCNTIP (procalcitonin:4,lacticidven:4)  ) Recent Results (from the past 240 hour(s))  Culture,  blood (routine x 2)     Status: None   Collection Time: 02/06/16  8:39 PM  Result Value Ref Range Status   Specimen Description BLOOD LEFT HAND  Final   Special Requests BOTTLES DRAWN AEROBIC ONLY 4CC  Final   Culture NO GROWTH 5 DAYS  Final   Report Status 02/11/2016 FINAL  Final  Culture, blood (routine x 2)     Status: None   Collection Time: 02/06/16  8:47 PM  Result Value Ref Range Status   Specimen Description BLOOD LEFT HAND  Final   Special Requests BOTTLES DRAWN AEROBIC ONLY 4CC  Final   Culture NO GROWTH 5 DAYS  Final   Report Status 02/11/2016 FINAL  Final  MRSA PCR Screening     Status: None   Collection Time: 02/06/16 10:39 PM  Result Value Ref Range Status   MRSA by PCR NEGATIVE NEGATIVE Final    Comment:        The GeneXpert MRSA Assay (FDA approved for NASAL specimens only), is one component of a comprehensive MRSA colonization surveillance program. It is not intended to diagnose MRSA infection nor to guide or monitor treatment for MRSA infections.   Urine culture     Status: None   Collection Time: 02/11/16  4:46 PM  Result Value Ref Range Status   Specimen Description URINE, CLEAN CATCH  Final   Special Requests NONE  Final   Culture NO GROWTH  Final   Report Status 02/12/2016 FINAL  Final  Aerobic/Anaerobic Culture(surg specimen) (NOT AT [facility])     Status: None (Preliminary result)   Collection Time: 02/11/16  5:16 PM  Result Value Ref Range Status   Specimen Description APPENDIX  Final   Special Requests NONE  Final   Gram Stain   Final    ABUNDANT WBC PRESENT, PREDOMINANTLY PMN RARE GRAM NEGATIVE RODS FEW GRAM POSITIVE COCCI IN PAIRS Gram Stain Report Called to,Read Back By and Verified With: Trina Ao RN 2133 02/11/16 A BROWNING    Culture CULTURE REINCUBATED FOR BETTER GROWTH  Final   Report Status PENDING  Incomplete      Radiology Studies: Dg Chest 2 View  02/11/2016  CLINICAL DATA:  Dyspnea on exertion, leukocytosis, persistent cough EXAM:  CHEST  2 VIEW COMPARISON:  02/06/2016 FINDINGS: Calcified granuloma in the right lower lung. No focal consolidation. No pleural effusion or pneumothorax. Cardiomegaly. Degenerative changes of the visualized thoracolumbar spine. IMPRESSION: No evidence of acute cardiopulmonary disease. Electronically Signed   By: Julian Hy M.D.   On: 02/11/2016 20:42  Scheduled Meds: . ciprofloxacin  500 mg Oral BID  . docusate sodium  100 mg Oral BID  . levothyroxine  50 mcg Oral QAC breakfast  . metroNIDAZOLE  500 mg Oral Q8H  . pantoprazole  40 mg Oral QHS  . polyethylene glycol  17 g Oral Daily  . rivaroxaban  20 mg Oral Q supper   Continuous Infusions: . dextrose 5 % and 0.45% NaCl Stopped (02/12/16 0730)     LOS: 7 days   Time Spent in minutes   30 minutes  Bambie Pizzolato D.O. on 02/13/2016 at 8:35 AM  Between 7am to 7pm - Pager - [phone]  After 7pm go to [email or web address] - password [email or web address]  And look for the night coverage person covering for me after hours  [facility] Office  [phone]

## 2016-02-14 MED ORDER — CIPROFLOXACIN HCL 500 MG PO TABS: 500.0000 mg | ORAL_TABLET | Freq: Two times a day (BID) | ORAL | Status: DC

## 2016-02-14 MED ORDER — METRONIDAZOLE 500 MG PO TABS: 500.0000 mg | ORAL_TABLET | Freq: Three times a day (TID) | ORAL | Status: DC

## 2016-02-14 MED ORDER — ONDANSETRON 4 MG PO TBDP: 4.0000 mg | ORAL_TABLET | Freq: Four times a day (QID) | ORAL | Status: DC | PRN

## 2016-02-14 MED ORDER — OXYCODONE-ACETAMINOPHEN 5-325 MG PO TABS: 1.0000 | ORAL_TABLET | ORAL | Status: DC | PRN

## 2016-02-14 NOTE — Discharge Instructions (Signed)
[facility] ______[facility] °LAPAROSCOPIC SURGERY: POST OP INSTRUCTIONS °Always review your discharge instruction sheet given to you by the facility where your surgery was performed. °IF YOU HAVE DISABILITY OR FAMILY LEAVE FORMS, YOU MUST BRING THEM TO THE OFFICE FOR PROCESSING.   °DO NOT GIVE THEM TO YOUR DOCTOR. ° °1. A prescription for pain medication may be given to you upon discharge.  Take your pain medication as prescribed, if needed.  If narcotic pain medicine is not needed, then you may take acetaminophen (Tylenol) or ibuprofen (Advil) as needed. °2. Take your usually prescribed medications unless otherwise directed. °3. If you need a refill on your pain medication, please contact your pharmacy.  They will contact our office to request authorization. Prescriptions will not be filled after 5pm or on week-ends. °4. You should follow a light diet the first few days after arrival home, such as soup and crackers, etc.  Be sure to include lots of fluids daily. °5. Most patients will experience some swelling and bruising in the area of the incisions.  Ice packs will help.  Swelling and bruising can take several days to resolve.  °6. It is common to experience some constipation if taking pain medication after surgery.  Increasing fluid intake and taking a stool softener (such as Colace) will usually help or prevent this problem from occurring.  A mild laxative (Milk of Magnesia or Miralax) should be taken according to package instructions if there are no bowel movements after 48 hours. °7. Unless discharge instructions indicate otherwise, you may remove your bandages 24-48 hours after surgery, and you may shower at that time.  You may have steri-strips (small skin tapes) in place directly over the incision.  These strips should be left on the skin for 7-10 days.  If your surgeon used skin glue on the incision, you may shower in 24 hours.  The glue will flake off over the next 2-3 weeks.  Any sutures or  staples will be removed at the office during your follow-up visit. °8. ACTIVITIES:  You may resume regular (light) daily activities beginning the next day--such as daily self-care, walking, climbing stairs--gradually increasing activities as tolerated.  You may have sexual intercourse when it is comfortable.  Refrain from any heavy lifting or straining until approved by your doctor. °a. You may drive when you are no longer taking prescription pain medication, you can comfortably wear a seatbelt, and you can safely maneuver your car and apply brakes. °b. RETURN TO WORK:  __________________________________________________________ °9. You should see your doctor in the office for a follow-up appointment approximately 2-3 weeks after your surgery.  Make sure that you call for this appointment within a day or two after you arrive home to insure a convenient appointment time. °10. OTHER INSTRUCTIONS: __________________________________________________________________________________________________________________________ __________________________________________________________________________________________________________________________ °WHEN TO CALL YOUR DOCTOR: °1. Fever over 101.0 °2. Inability to urinate °3. Continued bleeding from incision. °4. Increased pain, redness, or drainage from the incision. °5. Increasing abdominal pain ° °The clinic staff is available to answer your questions during regular business hours.  Please don’t hesitate to call and ask to speak to one of the nurses for clinical concerns.  If you have a medical emergency, go to the nearest emergency room or call 911.  A surgeon from [facility] is always on call at the hospital. °[address], [address], [place], [place]  [address] ? [address] [address], [place],    [address] °[phone] ? [phone] ? FAX [phone] °Web site:   [email or web address]   Bulb Drain Home Care A bulb drain consists of a thin  rubber tube and a soft, round bulb that creates a gentle suction. The rubber tube is placed in the area where you had surgery. A bulb is attached to the end of the tube that is outside the body. The bulb drain removes excess fluid that normally builds up in a surgical wound after surgery. The color and amount of fluid will vary. Immediately after surgery, the fluid is bright red and is a little thicker than water. It may gradually change to a yellow or pink color and become more thin and water-like. When the amount decreases to about 1 or 2 tbsp in 24 hours, your health care provider will usually remove it. DAILY CARE  Keep the bulb flat (compressed) at all times, except while emptying it. The flatness creates suction. You can flatten the bulb by squeezing it firmly in the middle and then closing the cap.  Keep sites where the tube enters the skin dry and covered with a bandage (dressing).  Secure the tube 1-2 in (2.5-5.1 cm) below the insertion sites to keep it from pulling on your stitches. The tube is stitched in place and will not slip out.  Secure the bulb as directed by your health care provider.  For the first 3 days after surgery, there usually is more fluid in the bulb. Empty the bulb whenever it becomes half full because the bulb does not create enough suction if it is too full. The bulb could also overflow. Write down how much fluid you remove each time you empty your drain. Add up the amount removed in 24 hours.  Empty the bulb at the same time every day once the amount of fluid decreases and you only need to empty it once a day. Write down the amounts and the 24-hour totals to give to your health care provider. This helps your health care provider know when the tubes can be removed. EMPTYING THE BULB DRAIN Before emptying the bulb, get a measuring cup, a piece of paper and a pen, and wash your hands.  Gently run your fingers down the tube (stripping) to empty any drainage from the  tubing into the bulb. This may need to be done several times a day to clear the tubing of clots and tissue.  Open the bulb cap to release suction, which causes it to inflate. Do not touch the inside of the cap.  Gently run your fingers down the tube (stripping) to empty any drainage from the tubing into the bulb.  Hold the cap out of the way, and pour fluid into the measuring cup.   Squeeze the bulb to provide suction.  Replace the cap.   Check the tape that holds the tube to your skin. If it is becoming loose, you can remove the loose piece of tape and apply a new one. Then, pin the bulb to your shirt.   Write down the amount of fluid you emptied out. Write down the date and each time you emptied your bulb drain. (If there are 2 bulbs, note the amount of drainage from each bulb and keep the totals separate. Your health care provider will want to know the total amounts for each drain and which tube is draining more.)   Flush the fluid down the toilet and wash your hands.   Call your health care provider once you have less than 2 tbsp of fluid collecting in the bulb drain  every 24 hours. If there is drainage around the tube site, change dressings and keep the area dry. Cleanse around tube with sterile saline and place dry gauze around site. This gauze should be changed when it is soiled. If it stays clean and unsoiled, it should still be changed daily.  SEEK MEDICAL CARE IF:  Your drainage has a bad smell or is cloudy.   You have a fever.   Your drainage is increasing instead of decreasing.   Your tube fell out.   You have redness or swelling around the tube site.   You have drainage from a surgical wound.   Your bulb drain will not stay flat after you empty it.  MAKE SURE YOU:   Understand these instructions.  Will watch your condition.  Will get help right away if you are not doing well or get worse.   This information is not intended to replace advice given  to you by your health care provider. Make sure you discuss any questions you have with your health care provider.   Document Released: 08/28/2000 Document Revised: 09/21/2014 Document Reviewed: 03/20/2015 Elsevier Interactive Patient Education [employer].

## 2016-02-14 NOTE — Progress Notes (Signed)
7 Days Post-Op  Subjective: She feels better, nausea with antibiotics without food.  She did well with the other pills yesterday.    Objective: Vital signs in last 24 hours: Temp:  [97.5 F (36.4 C)-97.9 F (36.6 C)] 97.5 F (36.4 C) (06/02 0603) Pulse Rate:  [84-94] 84 (06/02 0603) Resp:  [17-18] 17 (06/02 0603) BP: (145-172)/(79-91) 154/84 mmHg (06/02 0603) SpO2:  [92 %-97 %] 92 % (06/02 0603) Last BM Date: 03/08/16 840 Po 3050 urine Afebrile, VSS BP is still up some No labs   Intake/Output from previous day: 27-Feb-2023 0701 - 06/02 0700 In: 840 [P.O.:840] Out: 3055 [Urine:3050; Drains:5] Intake/Output this shift: Total I/O In: 240 [P.O.:240] Out: 350 [Urine:350]  General appearance: alert, cooperative and no distress Resp: clear to auscultation bilaterally GI: soft, sore, sites look fine, Drainage is the same.  tolerating diet     Lab Results:   Recent Labs  02/12/16 0501 2016-02-27 0240  WBC 12.6* 9.7  HGB 11.1* 10.7*  HCT 35.9* 34.6*  PLT 459* 422*    BMET No results for input(s): NA, K, CL, CO2, GLUCOSE, BUN, CREATININE, CALCIUM in the last 72 hours. PT/INR No results for input(s): LABPROT, INR in the last 72 hours.  No results for input(s): AST, ALT, ALKPHOS, BILITOT, PROT, ALBUMIN in the last 168 hours.   Lipase  No results found for: LIPASE   Studies/Results: Dg Abd 2 Views  02-27-2016  CLINICAL DATA:  Vomiting, history of perforated appendix EXAM: ABDOMEN - 2 VIEW COMPARISON:  02/06/2016 FINDINGS: Calcified granuloma right lung base noted. Nonobstructive bowel gas pattern. No evidence of free air. Stool retained throughout the colon. Drainage catheter projects over the abdomen/pelvis. IMPRESSION: No significant abnormalities radiographically Electronically Signed   By: Skipper Cliche M.D.   On: 2016-02-27 14:02    Medications: . ciprofloxacin  500 mg Oral BID  . docusate sodium  100 mg Oral BID  . levothyroxine  50 mcg Oral QAC breakfast  .  metroNIDAZOLE  500 mg Oral Q8H  . pantoprazole  40 mg Oral QHS  . polyethylene glycol  17 g Oral Daily  . rivaroxaban  20 mg Oral Q supper    Assessment/Plan Acute appendicitis with perforation Laparoscopic appendectomy, 02/08/16, Dr. Ninfa Linden Bilateral PE LLE/Hypercoagulable state on Xarelto Acute kidney injury Hypertension Hypothyroid FEN: Soft diet/ ID: Day 9 antibiotics Currently flagyl/Cipro oral  DVT: rivaroxaban    Plan:  Home today one more week of antibiotics, she does not want home health.  She has an appointment with Dr. Ninfa Linden on 02/19/16.  I will ask her to follow up with her PCP for her other issues.       LOS: 8 days    Erin Vasquez 02/14/2016 [id number]

## 2016-02-14 NOTE — Progress Notes (Signed)
TRH consulted for anticoagulation.  Patient placed back on Xarelto 01/15/2016.  She was also transitioned to oral antibiotics.  It seems that patient has nausea/vomiting after eating breakfast on 02/13/2016.  Abdominal xrays showed no significant abnormalities. Patient currently afebrile with no leukocytosis.  Will sign off.  Please feel free to reconsult if we can be of further assistance.  Alice Vitelli D.O. [facility] Pager [phone]  If 7PM-7AM, please contact night-coverage [email or web address] Password [facility] 02/14/2016, 10:42 AM

## 2016-02-14 NOTE — Progress Notes (Signed)
Discharge papers gone over with pt. Prescriptions given to pt. No questions/complaints. IV's are out. Pt. Taught how to empty JP drain and record output. Pt. States she understands how to empty and record. JP site cleaned and re-dressed with 4x4 split gauze and tape. Pt. States she understands how to change the JP dressing. Pt. D/c'd successfully via w/c with her mom.

## 2016-02-16 LAB — AEROBIC/ANAEROBIC CULTURE W GRAM STAIN (SURGICAL/DEEP WOUND)

## 2016-02-16 LAB — AEROBIC/ANAEROBIC CULTURE (SURGICAL/DEEP WOUND)

## 2016-03-12 ENCOUNTER — Emergency Department (HOSPITAL_COMMUNITY)
Admission: EM | Admit: 2016-03-12 | Discharge: 2016-03-13 | Disposition: A | Discharge status: Home / Self Care | Payer: Medicaid Other | Attending: Emergency Medicine | Admitting: Emergency Medicine

## 2016-03-12 ENCOUNTER — Encounter (HOSPITAL_COMMUNITY): Payer: Self-pay | Admitting: Emergency Medicine

## 2016-03-12 ENCOUNTER — Emergency Department (HOSPITAL_COMMUNITY): Payer: Medicaid Other

## 2016-03-12 DIAGNOSIS — Z8542 Personal history of malignant neoplasm of other parts of uterus: Secondary | ICD-10-CM | POA: Insufficient documentation

## 2016-03-12 DIAGNOSIS — R21 Rash and other nonspecific skin eruption: Secondary | ICD-10-CM | POA: Diagnosis not present

## 2016-03-12 DIAGNOSIS — R0602 Shortness of breath: Secondary | ICD-10-CM | POA: Diagnosis not present

## 2016-03-12 DIAGNOSIS — M79662 Pain in left lower leg: Secondary | ICD-10-CM | POA: Diagnosis not present

## 2016-03-12 DIAGNOSIS — J45909 Unspecified asthma, uncomplicated: Secondary | ICD-10-CM | POA: Insufficient documentation

## 2016-03-12 DIAGNOSIS — Z7901 Long term (current) use of anticoagulants: Secondary | ICD-10-CM | POA: Insufficient documentation

## 2016-03-12 LAB — CBC
HEMATOCRIT: 40.1 % (ref 36.0–46.0)
HEMOGLOBIN: 12.3 g/dL (ref 12.0–15.0)
MCH: 27.8 pg (ref 26.0–34.0)
MCHC: 30.7 g/dL (ref 30.0–36.0)
MCV: 90.7 fL (ref 78.0–100.0)
Platelets: 317 10*3/uL (ref 150–400)
RBC: 4.42 MIL/uL (ref 3.87–5.11)
RDW: 15.3 % (ref 11.5–15.5)
WBC: 6.9 10*3/uL (ref 4.0–10.5)

## 2016-03-12 LAB — BASIC METABOLIC PANEL
ANION GAP: 6 (ref 5–15)
BUN: 22 mg/dL — ABNORMAL HIGH (ref 6–20)
CALCIUM: 9.6 mg/dL (ref 8.9–10.3)
CHLORIDE: 106 mmol/L (ref 101–111)
CO2: 27 mmol/L (ref 22–32)
Creatinine, Ser: 1.12 mg/dL — ABNORMAL HIGH (ref 0.44–1.00)
GFR calc non Af Amer: 52 mL/min — ABNORMAL LOW (ref >60)
GLUCOSE: 82 mg/dL (ref 65–99)
Potassium: 4.4 mmol/L (ref 3.5–5.1)
Sodium: 139 mmol/L (ref 135–145)

## 2016-03-12 LAB — PROTIME-INR
INR: 1.3 (ref 0.00–1.49)
Prothrombin Time: 16.3 seconds — ABNORMAL HIGH (ref 11.6–15.2)

## 2016-03-12 LAB — I-STAT TROPONIN, ED: TROPONIN I, POC: 0.01 ng/mL (ref 0.00–0.08)

## 2016-03-12 MED ORDER — IOPAMIDOL (ISOVUE-370) INJECTION 76%
INTRAVENOUS | Status: AC
  Administered 2016-03-12: 100 mL
  Filled 2016-03-12: qty 100

## 2016-03-12 NOTE — ED Notes (Signed)
Pt sts SOB x months and left leg pain since having DVT and PE in February; pt here for continued issues; pt takes xerelto

## 2016-03-12 NOTE — ED Notes (Signed)
Patient transported to CT 

## 2016-03-12 NOTE — ED Notes (Signed)
Walked pt around the floor and pts O2 dropped down to 88%. HR was 140 while walking.

## 2016-03-13 ENCOUNTER — Ambulatory Visit (HOSPITAL_COMMUNITY)
Admission: RE | Admit: 2016-03-13 | Discharge: 2016-03-13 | Disposition: A | Discharge status: Home / Self Care | Payer: Medicaid Other | Source: Ambulatory Visit | Attending: Medical | Admitting: Medical

## 2016-03-13 DIAGNOSIS — M79609 Pain in unspecified limb: Secondary | ICD-10-CM

## 2016-03-13 DIAGNOSIS — K219 Gastro-esophageal reflux disease without esophagitis: Secondary | ICD-10-CM | POA: Diagnosis not present

## 2016-03-13 DIAGNOSIS — G4730 Sleep apnea, unspecified: Secondary | ICD-10-CM | POA: Diagnosis not present

## 2016-03-13 DIAGNOSIS — E039 Hypothyroidism, unspecified: Secondary | ICD-10-CM | POA: Insufficient documentation

## 2016-03-13 DIAGNOSIS — Z7901 Long term (current) use of anticoagulants: Secondary | ICD-10-CM | POA: Insufficient documentation

## 2016-03-13 DIAGNOSIS — M79662 Pain in left lower leg: Secondary | ICD-10-CM | POA: Diagnosis not present

## 2016-03-13 NOTE — ED Provider Notes (Signed)
CSN: [id number]     Arrival date & time 03/12/16  1447 History   First MD Initiated Contact with Patient 03/12/16 1946     Chief Complaint  Patient presents with  . Shortness of Breath  . Leg Pain    HPI Comments: 60 year old female presents with gradually increasing SOB and chronic left calf pain. PMH significant for DVT/PE currently on Xarelto, Uterine cancer, sleep apnea, chronic back pain, varicose veins, hypercoagulable state, hypothyroidism. She is s/p recent appendectomy in early June. She states that she was diagnosed with a DVT and PE in February. She has been anticoagulated since then and has been taking Xarelto as prescribed. In early June she had an acute onset of RLQ pain and was diagnosed with a perforated appendix. She had an appendectomy and was discharged home a J-tube. She states that ever since she was diagnosed with a PE she has been SOB and had HTN. Her SOB is exertional and she states she has been able to walk less and less without feeling shortwinded. Denies orthopnea or PND. Denies hx of COPD or tobacco use. Denies chest pain or cough. She does have a history of childhood asthma. She has chronic leg swelling as well and calf pain in the left calf which has not gone away. She also is reporting that she has been numb on her left anterior thigh and knee since her appendectomy. Denies fever, chills, chest pain, palpitations, abdominal pain, N/V/D.   Past Medical History  Diagnosis Date  . Thyroid disease   . Hypothyroidism   . GERD (gastroesophageal reflux disease)     tx. Tums  . H/O hiatal hernia     hx. esophageal dilation x1  . Vitamin D deficiency   . Anemia     during pregnancy  . DVT (deep venous thrombosis) ([place]) 10/29/2015    LLE  . Bilateral pulmonary embolism ([place]) 10/29/2015  . Childhood asthma   . Sleep apnea     "CPAP ordered; insurance wouldn't cover" (10/29/2015)  . History of esophageal dilatation   . Arthritis     "all over"  . Chronic back pain   .  Uterine cancer ([place])     uterine cancer  . Varicose veins    Past Surgical History  Procedure Laterality Date  . Bunionectomy with hammertoe reconstruction Left ~ 2005  . Tonsillectomy    . Esophagogastroduodenoscopy (egd) with propofol N/A 08/02/2013    Procedure: ESOPHAGOGASTRODUODENOSCOPY (EGD) WITH PROPOFOL;  Surgeon: Arta Silence, MD;  Location: [facility];  Service: Endoscopy;  Laterality: N/A;  . Balloon dilation N/A 08/02/2013    Procedure: BALLOON DILATION;  Surgeon: Arta Silence, MD;  Location: [facility];  Service: Endoscopy;  Laterality: N/A;  . Colonoscopy with propofol N/A 08/02/2013    Procedure: COLONOSCOPY WITH PROPOFOL;  Surgeon: Arta Silence, MD;  Location: [facility];  Service: Endoscopy;  Laterality: N/A;  . Shoulder arthroscopy with debridement and bicep tendon repair Left 09/11/2014    Procedure: SHOULDER ARTHROSCOPY WITH DEBRIDEMENT AND BICEP TENDON REPAIR;  Surgeon: Renette Butters, MD;  Location: [place];  Service: Orthopedics;  Laterality: Left;  . Shoulder arthroscopy with distal clavicle resection Left 09/11/2014    Procedure: SHOULDER ARTHROSCOPY WITH DISTAL CLAVICLE RESECTION;  Surgeon: Renette Butters, MD;  Location: [place];  Service: Orthopedics;  Laterality: Left;  . Shoulder arthroscopy with subacromial decompression Left 09/11/2014    Procedure: SHOULDER ARTHROSCOPY WITH SUBACROMIAL DECOMPRESSION;  Surgeon: Renette Butters, MD;  Location: [place];  Service: Orthopedics;  Laterality: Left;  . Robotic assisted total hysterectomy with bilateral salpingo oopherectomy  01/23/2014    uterine cancer  . Dilation and curettage of uterus  x2    S/P miscarriage  . Esophagogastroduodenoscopy (egd) with esophageal dilation  ~ 2003  . Laparoscopic appendectomy N/A 02/07/2016    Procedure: LAPAROSCOPIC APPENDECTOMY;  Surgeon: Coralie Keens, MD;  Location: [facility] OR;  Service: General;  Laterality: N/A;   Family History  Problem Relation Age of Onset  .  Transient ischemic attack Mother   . Heart attack Father   . Diabetes type II Other   . Lung cancer Maternal Grandmother    Social History  Substance Use Topics  . Smoking status: Never Smoker   . Smokeless tobacco: Former Systems developer    Types: Chew     Comment: "chewed as a teen  . Alcohol Use: Yes     Comment: "quit drinking by age 27; social drinker only"   OB History    No data available     Review of Systems  Constitutional: Negative for fever and chills.  Respiratory: Positive for shortness of breath. Negative for cough and wheezing.   Cardiovascular: Positive for leg swelling. Negative for chest pain and palpitations.  Gastrointestinal: Negative for nausea, vomiting, abdominal pain and diarrhea.  All other systems reviewed and are negative.   Allergies  Bee venom; Other; Food; Morphine and related; and Penicillins  Home Medications   Prior to Admission medications   Medication Sig Start Date End Date Taking? Authorizing Provider  Cholecalciferol (VITAMIN D3) 5000 units TABS Take 1 tablet by mouth daily. 12/03/15  Yes Historical Provider, MD  levothyroxine (SYNTHROID, LEVOTHROID) 50 MCG tablet Take 50 mcg by mouth daily before breakfast.    Yes Historical Provider, MD  meloxicam (MOBIC) 15 MG tablet Take 15 mg by mouth at bedtime.    Yes Historical Provider, MD  ondansetron (ZOFRAN-ODT) 4 MG disintegrating tablet Take 1 tablet (4 mg total) by mouth every 6 (six) hours as needed for nausea. 02/14/16  Yes Earnstine Regal, PA-C  ranitidine (ZANTAC) 150 MG tablet Take 150 mg by mouth 2 (two) times daily.   Yes Historical Provider, MD  rivaroxaban (XARELTO) 20 MG TABS tablet Take 20 mg by mouth daily with supper.   Yes Historical Provider, MD  ciprofloxacin (CIPRO) 500 MG tablet Take 1 tablet (500 mg total) by mouth 2 (two) times daily. Patient not taking: Reported on 03/12/2016 02/14/16   Earnstine Regal, PA-C  metroNIDAZOLE (FLAGYL) 500 MG tablet Take 1 tablet (500 mg total) by mouth  every 8 (eight) hours. Patient not taking: Reported on 03/12/2016 02/14/16   Earnstine Regal, PA-C  oxyCODONE-acetaminophen (PERCOCET/ROXICET) 5-325 MG tablet Take 1-2 tablets by mouth every 4 (four) hours as needed for moderate pain. Patient not taking: Reported on 03/12/2016 02/14/16   Earnstine Regal, PA-C   BP 169/102 mmHg  Pulse 88  Temp(Src) 98.5 F (36.9 C) (Oral)  Resp 16  SpO2 89%   Physical Exam  Constitutional: She is oriented to person, place, and time. She appears well-developed and well-nourished. No distress.  Obese; very pleasant and talkative  HENT:  Head: Normocephalic and atraumatic.  Eyes: Conjunctivae are normal. Pupils are equal, round, and reactive to light. Right eye exhibits no discharge. Left eye exhibits no discharge. No scleral icterus.  Neck: Normal range of motion.  Cardiovascular: Normal rate and regular rhythm.  Exam reveals no gallop and no friction rub.   No murmur heard. Pulmonary/Chest: Effort normal  and breath sounds normal. No respiratory distress. She has no wheezes. She has no rales. She exhibits no tenderness.  Abdominal: Soft. Bowel sounds are normal. She exhibits no distension and no mass. There is no tenderness. There is no rebound and no guarding.  Well healing scars from lap chole. Large hernia on left side of abdominal wall which is non-tender and reducible.  Musculoskeletal:  Tenderness to palpation of medial and posterior aspect of left calf. No swelling or erythema. No tenderness, swelling, or erythema of right calf.  Neurological: She is alert and oriented to person, place, and time.  Skin: Skin is warm and dry.  Petechial rash on right and left hands  Psychiatric: She has a normal mood and affect. Her behavior is normal.    ED Course  Procedures (including critical care time) Labs Review Labs Reviewed  BASIC METABOLIC PANEL - Abnormal; Notable for the following:    BUN 22 (*)    Creatinine, Ser 1.12 (*)    GFR calc non Af Amer 52  (*)    All other components within normal limits  PROTIME-INR - Abnormal; Notable for the following:    Prothrombin Time 16.3 (*)    All other components within normal limits  CBC  I-STAT TROPOININ, ED    Imaging Review Dg Chest 2 View  03/12/2016  CLINICAL DATA:  Shortness of breath for several months EXAM: CHEST  2 VIEW COMPARISON:  Feb 11, 2016 FINDINGS: There is a stable calcified granuloma in the posterior segment right lower lobe region. There is slight atelectasis in the left base. Lungs elsewhere clear. Heart size and pulmonary vascularity are normal. No adenopathy. There is atherosclerotic calcification in the aortic arch. Degenerative changes noted in the thoracic spine. IMPRESSION: Calcified granuloma posterior right base. Mild left base atelectasis. Lungs elsewhere clear. Stable cardiac silhouette. Aortic atherosclerosis noted. Electronically Signed   By: Lowella Grip III M.D.   On: 03/12/2016 15:38   Ct Angio Chest Pe W/cm &/or Wo Cm  03/12/2016  CLINICAL DATA:  60 year old female with shortness of breath EXAM: CT ANGIOGRAPHY CHEST WITH CONTRAST TECHNIQUE: Multidetector CT imaging of the chest was performed using the standard protocol during bolus administration of intravenous contrast. Multiplanar CT image reconstructions and MIPs were obtained to evaluate the vascular anatomy. CONTRAST:  67 cc Isovue 370 COMPARISON:  Chest radiograph dated 03/12/2016 and chest CT dated 10/29/2015 FINDINGS: A 1 cm right lung base calcified granuloma. The lungs are clear. There is no pleural effusion or pneumothorax. The central airways are patent. The thoracic aorta appears unremarkable. The origins of the great vessels of the aortic arch appear patent. Evaluation of the pulmonary arteries is limited due to suboptimal opacification and timing of the contrast. No definite central pulmonary artery embolus identified. There is mild prominence of the main pulmonary trunk suggestive of a degree of  pulmonary hypertension. Top-normal cardiac size. There is coronary vascular calcification. No pericardial effusion. Right hilar calcified granuloma. No adenopathy. Small hiatal hernia. The esophagus is grossly unremarkable. There is no axillary adenopathy. The chest wall soft tissues appear unremarkable. There is degenerative changes of the spine. No acute fracture. The visualized upper abdomen appears unremarkable. Review of the MIP images confirms the above findings. IMPRESSION: No acute intrathoracic pathology. No CT evidence of central pulmonary artery embolus. Electronically Signed   By: Anner Crete M.D.   On: 03/12/2016 22:32   I have personally reviewed and evaluated these images and lab results as part of my medical decision-making.  EKG Interpretation None      MDM   Final diagnoses:  Shortness of breath   60 year old female who presents with gradually worsening SOB and calf pain. Unclear etiology. CTA of chest is negative for PE. Does comment on possibly some pulmonary hypertension. CXR shows mild atelectasis. Her O2 sats are normal at rest however when nursing walked her they did drop in to the low 90s and she became tachycardic. She was not markedly symptomatic while walking. There is a reading of 89% however I think this was an anomaly. Otherwise she is afebrile and not tachypneic. She does have HTN and will possibly need to go on medicine for this. Discussed with her to bring this up with her PCP. CBC is unremarkable. BMP is remarkable for mild renal insufficiency. Cardiac work up negative: normal troponin and EKG is NSR. Scheduled pt for outpatient DVT study which she will go to tomorrow. Recommend PCP follow up. Patient is NAD, non-toxic, with stable VS. Patient is informed of clinical course, understands medical decision making process, and agrees with plan. Opportunity for questions provided and all questions answered. Return precautions given.     Recardo Evangelist,  PA-C 03/13/16 1820  Fredia Sorrow, MD 03/16/16 2215

## 2016-03-13 NOTE — ED Notes (Signed)
Pt ambulated. Pt pulse ox started at 90% on RA. Pt O2 ranged from 91- 95% while ambulating and HR increased to a max of 114.

## 2016-03-13 NOTE — Progress Notes (Signed)
Preliminary results by tech - Left Lower Ext. Venous Duplex Completed. No evidence of acute deep vein thrombosis in the left leg. There is chronic appearing thrombus in the femoral vein, popliteal vein and calf veins from prior deep vein thrombosis found on 10/29/15. Oda Cogan, BS, RDMS, RVT

## 2016-03-13 NOTE — Discharge Instructions (Signed)
Please follow up with your primary care doctor for hospsital follow up for you blood pressure and shortness of breath. Please ask to be further evaluated with "PFT"

## 2016-04-03 DIAGNOSIS — Z8719 Personal history of other diseases of the digestive system: Secondary | ICD-10-CM | POA: Insufficient documentation

## 2016-04-03 DIAGNOSIS — Z8542 Personal history of malignant neoplasm of other parts of uterus: Secondary | ICD-10-CM | POA: Insufficient documentation

## 2016-04-03 DIAGNOSIS — D6862 Lupus anticoagulant syndrome: Secondary | ICD-10-CM | POA: Insufficient documentation

## 2016-04-10 ENCOUNTER — Encounter: Payer: Self-pay | Admitting: Vascular Surgery

## 2016-04-14 ENCOUNTER — Inpatient Hospital Stay (HOSPITAL_COMMUNITY)
Admission: RE | Admit: 2016-04-14 | Discharge: 2016-04-14 | Disposition: A | Discharge status: Home / Self Care | Payer: Medicaid Other | Source: Ambulatory Visit | Attending: Vascular Surgery | Admitting: Vascular Surgery

## 2016-04-14 ENCOUNTER — Encounter: Payer: Self-pay | Admitting: Vascular Surgery

## 2016-04-14 ENCOUNTER — Ambulatory Visit (INDEPENDENT_AMBULATORY_CARE_PROVIDER_SITE_OTHER): Payer: Medicaid Other | Admitting: Vascular Surgery

## 2016-04-14 VITALS — BP 148/84 | HR 90 | Temp 97.1°F | Resp 16 | Ht 63.0 in | Wt 282.0 lb

## 2016-04-14 DIAGNOSIS — I83893 Varicose veins of bilateral lower extremities with other complications: Secondary | ICD-10-CM | POA: Diagnosis not present

## 2016-04-14 NOTE — Progress Notes (Signed)
Subjective:     Patient ID: Erin Vasquez, female   DOB: 1956/08/15, 60 y.o.   MRN: [id number]  HPI this 60 year old female was referred by Sherrlyn Hock nurse practitioner for painful varicosities both legs. Patient had previous left leg DVT which was discovered every 14 2017 on ultrasound and pulmonary embolus. She had an ultrasound performed on July 28 at [facility]. This revealed no DVT in the right leg. This study was ordered by her bariatric surgeon in [place] who plans possible bariatric surgery in the fall of this year. Patient has been on chronic anticoagulation since the pulmonary embolus in February. She has small varicose veins in her right leg which are chronic and a been present for many years. She has aching discomfort in both lower extremities.  Past Medical History:  Diagnosis Date  . Anemia    during pregnancy  . Arthritis    "all over"  . Bilateral pulmonary embolism ([place]) 10/29/2015  . Childhood asthma   . Chronic back pain   . DVT (deep venous thrombosis) ([place]) 10/29/2015   LLE  . GERD (gastroesophageal reflux disease)    tx. Tums  . H/O hiatal hernia    hx. esophageal dilation x1  . History of esophageal dilatation   . Hypothyroidism   . Sleep apnea    "CPAP ordered; insurance wouldn't cover" (10/29/2015)  . Thyroid disease   . Uterine cancer ([place])    uterine cancer  . Varicose veins   . Vitamin D deficiency     Social History  Substance Use Topics  . Smoking status: Never Smoker  . Smokeless tobacco: Former Systems developer    Types: Chew     Comment: "chewed as a teen  . Alcohol use Yes     Comment: "quit drinking by age 67; social drinker only"    Family History  Problem Relation Age of Onset  . Transient ischemic attack Mother   . Heart attack Father   . Diabetes type II Other   . Lung cancer Maternal Grandmother     Allergies  Allergen Reactions  . Bee Venom Swelling and Other (See Comments)    Patient is allergic to bee venom, mosquitos,  wasps, yellow jackets and bees. Wherever she gets stung that area swells, turns hot and has fevers.    . Other Anaphylaxis and Shortness Of Breath    Hickory smoke  . Food Other (See Comments)    Tangerines. Unknown childhood allergy.   . Morphine And Related Itching  . Penicillins Other (See Comments)    UNKNOWN: CHILDHOOD ALLERGY Has patient had a PCN reaction causing immediate rash, facial/tongue/throat swelling, SOB or lightheadedness with hypotension: YES Has patient had a PCN reaction causing severe rash involving mucus membranes or skin necrosis: NO Has patient had a PCN reaction that required hospitalization NO Has patient had a PCN reaction occurring within the last 10 years: NO If all of the above answers are "NO", then may proceed with Cephalosporin use.     Current Outpatient Prescriptions:  .  AMLODIPINE BESYLATE PO, Take 10 mg by mouth at bedtime., Disp: , Rfl:  .  Cholecalciferol (VITAMIN D3) 5000 units TABS, Take 1 tablet by mouth daily., Disp: , Rfl:  .  ciprofloxacin (CIPRO) 500 MG tablet, Take 1 tablet (500 mg total) by mouth 2 (two) times daily., Disp: 14 tablet, Rfl: 1 .  clobetasol cream (TEMOVATE) AB-123456789 %, Apply 1 application topically as needed., Disp: , Rfl:  .  levothyroxine (SYNTHROID, LEVOTHROID)  50 MCG tablet, Take 50 mcg by mouth daily before breakfast. , Disp: , Rfl:  .  meloxicam (MOBIC) 15 MG tablet, Take 15 mg by mouth at bedtime. , Disp: , Rfl:  .  ranitidine (ZANTAC) 150 MG tablet, Take 150 mg by mouth 2 (two) times daily., Disp: , Rfl:  .  rivaroxaban (XARELTO) 20 MG TABS tablet, Take 20 mg by mouth daily with supper., Disp: , Rfl:  .  traMADol (ULTRAM) 50 MG tablet, Take by mouth every 6 (six) hours as needed., Disp: , Rfl:  .  metroNIDAZOLE (FLAGYL) 500 MG tablet, Take 1 tablet (500 mg total) by mouth every 8 (eight) hours. (Patient not taking: Reported on 03/12/2016), Disp: 21 tablet, Rfl: 1 .  ondansetron (ZOFRAN-ODT) 4 MG disintegrating tablet, Take 1  tablet (4 mg total) by mouth every 6 (six) hours as needed for nausea. (Patient not taking: Reported on 04/14/2016), Disp: 20 tablet, Rfl: 0 .  oxyCODONE-acetaminophen (PERCOCET/ROXICET) 5-325 MG tablet, Take 1-2 tablets by mouth every 4 (four) hours as needed for moderate pain. (Patient not taking: Reported on 03/12/2016), Disp: 40 tablet, Rfl: 0  Vitals:   04/14/16 1131  BP: (!) 148/84  Pulse: 90  Resp: 16  Temp: 97.1 F (36.2 C)  SpO2: 93%  Weight: 282 lb (127.9 kg)  Height: 5\' 3"  (1.6 m)    Body mass index is 49.95 kg/m.          Review of Systems denies chest pain. Does have dyspnea on exertion. Has left shoulder issues and needs surgery. Had recent appendicitis and now has abdominal hernia. The history and present illness     Objective:   Physical Exam BP (!) 148/84 (BP Location: Right Arm, Patient Position: Sitting, Cuff Size: Normal)   Pulse 90   Temp 97.1 F (36.2 C)   Resp 16   Ht 5\' 3"  (1.6 m)   Wt 282 lb (127.9 kg)   SpO2 93%   BMI 49.95 kg/m     Gen.-alert and oriented x3 in no apparent distress-morbidly obese HEENT normal for age Lungs no rhonchi or wheezing Cardiovascular regular rhythm no murmurs carotid pulses 3+ palpable no bruits audible Abdomen soft nontender no palpable masses Musculoskeletal free of  major deformities Skin clear -no rashes Neurologic normal Lower extremities 3+ femoral and dorsalis pedis pulses palpable bilaterally with 1+ edema bilaterally Small superficial varicosities right medial calf proximally over great saphenous system with spider veins medially and laterally and the thighs. No hyperpigmentation or ulceration noted.  Today I reviewed the report from the ultrasound 10/29/2015 and the report from the ultrasound 04/10/2016. Both reveal chronic thrombus in left tibial veins and popliteal vein and profunda femoris vein. There is no evidence of DVT in the right leg on recent study. 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

## 2016-04-15 DIAGNOSIS — M25552 Pain in left hip: Secondary | ICD-10-CM

## 2016-04-15 DIAGNOSIS — M21611 Bunion of right foot: Secondary | ICD-10-CM | POA: Insufficient documentation

## 2016-04-15 DIAGNOSIS — M25551 Pain in right hip: Secondary | ICD-10-CM | POA: Insufficient documentation

## 2016-04-17 ENCOUNTER — Other Ambulatory Visit: Payer: Self-pay | Admitting: Orthopedic Surgery

## 2016-04-17 DIAGNOSIS — M545 Low back pain: Secondary | ICD-10-CM

## 2016-04-20 ENCOUNTER — Other Ambulatory Visit: Payer: Medicaid Other

## 2016-04-26 ENCOUNTER — Other Ambulatory Visit: Payer: Medicaid Other

## 2016-05-05 ENCOUNTER — Ambulatory Visit: Payer: Medicaid Other | Attending: Orthopedic Surgery | Admitting: Physical Therapy

## 2016-05-05 ENCOUNTER — Encounter: Payer: Self-pay | Admitting: *Deleted

## 2016-05-05 DIAGNOSIS — M545 Low back pain, unspecified: Secondary | ICD-10-CM

## 2016-05-05 NOTE — Therapy (Signed)
[facility] [address] [place], [place], [address] Phone: [phone]   Fax:  [phone]  Physical Therapy Evaluation  Patient Details  Name: Erin Vasquez MRN: [id number] Date of Birth: April 12, 1956 Referring Provider: Margarita Ranaimothy Murphy, MD  Encounter Date: 05/05/2016      PT End of Session - 05/05/16 1533    Visit Number 1   Authorization Type medicaid- one time evaluation   PT Start Time 1330   PT Stop Time 1413   PT Time Calculation (min) 43 min   Activity Tolerance Patient tolerated treatment well   Behavior During Therapy [facility] for tasks assessed/performed      Past Medical History:  Diagnosis Date  . Anemia    during pregnancy  . Arthritis    "all over"  . Bilateral pulmonary embolism ([facility]) 10/29/2015  . Childhood asthma   . Chronic back pain   . DVT (deep venous thrombosis) ([facility]) 10/29/2015   LLE  . GERD (gastroesophageal reflux disease)    tx. Tums  . H/O hiatal hernia    hx. esophageal dilation x1  . History of esophageal dilatation   . Hypothyroidism   . Sleep apnea    "CPAP ordered; insurance wouldn't cover" (10/29/2015)  . Thyroid disease   . Uterine cancer ([facility])    uterine cancer  . Varicose veins   . Vitamin D deficiency     Past Surgical History:  Procedure Laterality Date  . BALLOON DILATION N/A 08/02/2013   Procedure: BALLOON DILATION;  Surgeon: Willis ModenaWilliam Outlaw, MD;  Location: [facility];  Service: Endoscopy;  Laterality: N/A;  . BUNIONECTOMY WITH HAMMERTOE RECONSTRUCTION Left ~ 2005  . COLONOSCOPY WITH PROPOFOL N/A 08/02/2013   Procedure: COLONOSCOPY WITH PROPOFOL;  Surgeon: Willis ModenaWilliam Outlaw, MD;  Location: [facility];  Service: Endoscopy;  Laterality: N/A;  . DILATION AND CURETTAGE OF UTERUS  x2   S/P miscarriage  . ESOPHAGOGASTRODUODENOSCOPY (EGD) WITH ESOPHAGEAL DILATION  ~ 2003  . ESOPHAGOGASTRODUODENOSCOPY (EGD) WITH PROPOFOL N/A 08/02/2013   Procedure: ESOPHAGOGASTRODUODENOSCOPY  (EGD) WITH PROPOFOL;  Surgeon: Willis ModenaWilliam Outlaw, MD;  Location: [facility];  Service: Endoscopy;  Laterality: N/A;  . LAPAROSCOPIC APPENDECTOMY N/A 02/07/2016   Procedure: LAPAROSCOPIC APPENDECTOMY;  Surgeon: Abigail Miyamotoouglas Blackman, MD;  Location: [facility] OR;  Service: General;  Laterality: N/A;  . ROBOTIC ASSISTED TOTAL HYSTERECTOMY WITH BILATERAL SALPINGO OOPHERECTOMY  01/23/2014   uterine cancer  . SHOULDER ARTHROSCOPY WITH DEBRIDEMENT AND BICEP TENDON REPAIR Left 09/11/2014   Procedure: SHOULDER ARTHROSCOPY WITH DEBRIDEMENT AND BICEP TENDON REPAIR;  Surgeon: Sheral Apleyimothy D Murphy, MD;  Location: [facility] OR;  Service: Orthopedics;  Laterality: Left;  . SHOULDER ARTHROSCOPY WITH DISTAL CLAVICLE RESECTION Left 09/11/2014   Procedure: SHOULDER ARTHROSCOPY WITH DISTAL CLAVICLE RESECTION;  Surgeon: Sheral Apleyimothy D Murphy, MD;  Location: [facility];  Service: Orthopedics;  Laterality: Left;  . SHOULDER ARTHROSCOPY WITH SUBACROMIAL DECOMPRESSION Left 09/11/2014   Procedure: SHOULDER ARTHROSCOPY WITH SUBACROMIAL DECOMPRESSION;  Surgeon: Sheral Apleyimothy D Murphy, MD;  Location: [facility];  Service: Orthopedics;  Laterality: Left;  . TONSILLECTOMY      There were no vitals filed for this visit.       Subjective Assessment - 05/05/16 1338    Subjective Pt has had chronic back pain since falling off of a horse in her 20s. When asked where pain is, she points to SIJ, bilateral. L thigh has been "asleep" since June 2. Occasional radicular pains when sitting on toilet. Unable to sleep. Pt reports laying on the stairs and sliding down to massage  her back.    How long can you sit comfortably? 20 min   How long can you walk comfortably? 20 min   Currently in Pain? Yes   Pain Score 8    Pain Location Back   Pain Orientation Lower;Right;Left   Pain Descriptors / Indicators Sore;Sharp   Pain Relieving Factors massage            [facility] PT Assessment - 05/05/16 0001      Assessment   Medical Diagnosis lumbago   Referring Provider Edmonia Lynch, MD   Hand Dominance Left   Prior Therapy no     Precautions   Precautions None     Restrictions   Weight Bearing Restrictions No     Balance Screen   Has the patient fallen in the past 6 months No     [place] residence   Living Arrangements Non-relatives/Friends     Prior Function   Level of Independence Independent     Cognition   Overall Cognitive Status Within Functional Limits for tasks assessed     Sensation   Additional Comments L thigh feels "asleep"     Posture/Postural Control   Posture Comments R iliiac crest elevation     Palpation   Palpation comment bilateral SIJ TTP     Ambulation/Gait   Gait Comments R hip hike in swing phase                   [facility] Adult PT Treatment/Exercise - 05/05/16 0001      Exercises   Exercises Knee/Hip     Knee/Hip Exercises: Stretches   Piriformis Stretch 30 seconds;Both     Knee/Hip Exercises: Seated   Other Seated Knee/Hip Exercises abdominal control     Knee/Hip Exercises: Supine   Other Supine Knee/Hip Exercises post pelvic tilt   Other Supine Knee/Hip Exercises pelvit tilt with iso abd, and iso add     Manual Therapy   Manual therapy comments educated in use of tennis ball for trigger point release                PT Education - 05/05/16 1533    Education provided Yes   Education Details anatomy of condition, POC, HEP, exercise form/rationale   Person(s) Educated Patient   Methods Explanation;Demonstration;Tactile cues;Verbal cues;Handout   Comprehension Verbalized understanding;Returned demonstration;Verbal cues required;Tactile cues required;Need further instruction                    Plan - 05/05/16 1534    Clinical Impression Statement Pt presents to PT with complaints of low back pain that has been presents since her 20s when she fell off of a horse. Pt demo elevated R ASIS and L lateral trunk lean in standing reporting that  she felt that she was just placing weight into L knee when corrected, R hip hike in swing phase of gait. In supine pt presents with functional LLD that was correctable with short bout of manual traction. Pt has pain at bilateral SIJ and presents notably poor coordated transverse abdominis contraction leading to poor coordinated control of bilateral hip joints and instability at lumbo pelvic region. Pt was educated on stability exercises for lumbopelvic musculature and use of tennis ball to relieve trigger points. Pt will not be continuing with PT at this time to insurance/financial limitations.    PT Treatment/Interventions ADLs/Self Care Home Management;Therapeutic exercise;Patient/family education   PT Next Visit Plan medicaid-one time evaluation  Consulted and Agree with Plan of Care Patient      Patient will benefit from skilled therapeutic intervention in order to improve the following deficits and impairments:     Visit Diagnosis: Bilateral low back pain without sciatica - Plan: PT plan of care cert/re-cert     Problem List Patient Active Problem List   Diagnosis Date Noted  . Varicose veins of bilateral lower extremities with other complications [id number]  . Acute perforated appendicitis   . AKI (acute kidney injury) ([place]) 02/08/2016  . Ruptured appendicitis 02/06/2016  . Hyponatremia 02/06/2016  . Sepsis, unspecified organism ([place]) 02/06/2016  . Dehydration 02/06/2016  . GERD (gastroesophageal reflux disease) 02/06/2016  . Pulmonary embolism ([place]) 10/29/2015  . Obese   . Pulmonary embolism, bilateral ([place])   . Thyroid activity decreased   . Arthritis   . Left shoulder pain     Janiya Millirons C. Chanell Nadeau PT, DPT 05/05/16 3:49 PM   [facility] [address] [place], [place], [address] Phone: [phone]   Fax:  [phone]  Name: Erin Vasquez MRN: [id number] Date of Birth: 02-Jan-1956

## 2016-05-05 NOTE — Progress Notes (Signed)
Faxed paperwork to [facility] regarding procedure on 06/15/16. Fax [phone].

## 2016-05-05 NOTE — Patient Instructions (Signed)
Access Code: [email or web address]  URL: [email or web address]  Date: 05/05/2016  Prepared by: Selinda Eon   Exercises  Supine Posterior Pelvic Tilt - 10 reps - 1 sets - 7x weekly  Supine Hip Adduction Isometric with Ball - 10 reps - 3 sets - 5 hold - 3x daily - 5x weekly  Hooklying Clamshell with Resistance - 10 reps - 3 sets - 1 hold - 1x daily - 7x weekly  Supine Piriformis Stretch with Foot on Ground - 3 reps - 1 sets - 30 hold - 2x daily - 7x weekly  Seated Pelvic Tilt  Standing Posterior Pelvic Tilt

## 2016-05-16 ENCOUNTER — Ambulatory Visit
Admission: RE | Admit: 2016-05-16 | Discharge: 2016-05-16 | Disposition: A | Discharge status: Home / Self Care | Payer: Medicaid Other | Source: Ambulatory Visit | Attending: Orthopedic Surgery | Admitting: Orthopedic Surgery

## 2016-05-16 ENCOUNTER — Other Ambulatory Visit: Payer: Medicaid Other

## 2016-05-16 DIAGNOSIS — M545 Low back pain: Secondary | ICD-10-CM

## 2016-06-04 ENCOUNTER — Telehealth: Payer: Self-pay | Admitting: Oncology

## 2016-06-04 ENCOUNTER — Ambulatory Visit (HOSPITAL_BASED_OUTPATIENT_CLINIC_OR_DEPARTMENT_OTHER): Payer: Medicaid Other | Admitting: Oncology

## 2016-06-04 ENCOUNTER — Other Ambulatory Visit: Payer: Medicaid Other

## 2016-06-04 VITALS — BP 135/78 | HR 90 | Temp 98.1°F | Resp 17 | Ht 63.0 in | Wt 277.3 lb

## 2016-06-04 DIAGNOSIS — I2699 Other pulmonary embolism without acute cor pulmonale: Secondary | ICD-10-CM | POA: Diagnosis present

## 2016-06-04 DIAGNOSIS — I82409 Acute embolism and thrombosis of unspecified deep veins of unspecified lower extremity: Secondary | ICD-10-CM

## 2016-06-04 NOTE — Addendum Note (Signed)
Addended by: Randolm Idol on: 06/04/2016 03:42 PM   Modules accepted: Orders

## 2016-06-04 NOTE — Progress Notes (Signed)
Hematology and Oncology Follow Up Visit  Erin Vasquez [id number] 1956-08-23 60 y.o. 06/04/2016 2:34 PM Erin Vasquez, MDBailey, Erin Humbles, FNP   Principle Diagnosis: 60 year old with left lower extremity deep vein thrombosis and pulmonary emboli diagnosed in February 2017. That episode was presumably provoked by immobility, obesity and potential trauma to her left leg.    Current therapy:  Xarelto 20 mg daily since February 2017.    Interim History: Erin Vasquez presents today for a follow-up visit. Since the last visit, she underwent an appendectomy in May 2017 after she presented with acute perforated appendicitis. She underwent a laparoscopic appendectomy and recovered well at this time. She presented with acute chest pain on 03/13/2016 and CT scan of the chest showed no pulmonary embolism noted. Her ultrasound Doppler of lower extremity showed regression of her deep vein thrombosis. Clinically she has no complaints at this time and contemplating bariatric surgery in November 2017.  She denied any headaches blurred vision, syncope or seizures. She denied any fevers, chills, sweats or weight loss. She denies any chest pain, difficulty breathing, cough or wheezing. She denied any hemoptysis or hematemesis. She does have residual lower extremity edema but no palpitation, orthopnea or dyspnea exertion. She does not report any nausea, vomiting or abdominal pain. She does not report any constipation or diarrhea. She does not report any frequency urgency or hesitancy. Remaining review of systems unremarkable.  Medications: I have reviewed the patient's current medications.  Current Outpatient Prescriptions  Medication Sig Dispense Refill  . AMLODIPINE BESYLATE PO Take 10 mg by mouth at bedtime.    . Cholecalciferol (VITAMIN D3) 5000 units TABS Take 1 tablet by mouth daily.    . ciprofloxacin (CIPRO) 500 MG tablet Take 1 tablet (500 mg total) by mouth 2 (two) times daily. (Patient not taking: Reported on  05/05/2016) 14 tablet 1  . clobetasol cream (TEMOVATE) AB-123456789 % Apply 1 application topically as needed.    Marland Kitchen levothyroxine (SYNTHROID, LEVOTHROID) 50 MCG tablet Take 50 mcg by mouth daily before breakfast.     . meloxicam (MOBIC) 15 MG tablet Take 15 mg by mouth at bedtime.     . metroNIDAZOLE (FLAGYL) 500 MG tablet Take 1 tablet (500 mg total) by mouth every 8 (eight) hours. (Patient not taking: Reported on 03/12/2016) 21 tablet 1  . ondansetron (ZOFRAN-ODT) 4 MG disintegrating tablet Take 1 tablet (4 mg total) by mouth every 6 (six) hours as needed for nausea. (Patient not taking: Reported on 04/14/2016) 20 tablet 0  . oxyCODONE-acetaminophen (PERCOCET/ROXICET) 5-325 MG tablet Take 1-2 tablets by mouth every 4 (four) hours as needed for moderate pain. (Patient not taking: Reported on 03/12/2016) 40 tablet 0  . pseudoephedrine-acetaminophen (TYLENOL SINUS) 30-500 MG TABS tablet Take 1 tablet by mouth every 4 (four) hours as needed.    . ranitidine (ZANTAC) 150 MG tablet Take 150 mg by mouth 2 (two) times daily.    . rivaroxaban (XARELTO) 20 MG TABS tablet Take 20 mg by mouth daily with supper.    . traMADol (ULTRAM) 50 MG tablet Take by mouth every 6 (six) hours as needed.     No current facility-administered medications for this visit.      Allergies:  Allergies  Allergen Reactions  . Bee Venom Swelling and Other (See Comments)    Patient is allergic to bee venom, mosquitos, wasps, yellow jackets and bees. Wherever she gets stung that area swells, turns hot and has fevers.    . Other Anaphylaxis and Shortness Of  Breath    Hickory smoke  . Food Other (See Comments)    Tangerines. Unknown childhood allergy.   . Morphine And Related Itching  . Penicillins Other (See Comments)    UNKNOWN: CHILDHOOD ALLERGY Has patient had a PCN reaction causing immediate rash, facial/tongue/throat swelling, SOB or lightheadedness with hypotension: YES Has patient had a PCN reaction causing severe rash involving  mucus membranes or skin necrosis: NO Has patient had a PCN reaction that required hospitalization NO Has patient had a PCN reaction occurring within the last 10 years: NO If all of the above answers are "NO", then may proceed with Cephalosporin use.    Past Medical History, Surgical history, Social history, and Family History were reviewed and updated.  Physical Exam: Blood pressure 135/78, pulse 90, temperature 98.1 F (36.7 C), temperature source Oral, resp. rate 17, height 5\' 3"  (1.6 m), weight 277 lb 4.8 oz (125.8 kg), SpO2 95 %. ECOG: 1 General appearance: alert and cooperative Head: Normocephalic, without obvious abnormality Neck: no adenopathy Lymph nodes: Cervical, supraclavicular, and axillary nodes normal. Heart:regular rate and rhythm, S1, S2 normal, no murmur, click, rub or gallop Lung:chest clear, no wheezing, rales, normal symmetric air entry Abdomin: soft, non-tender, without masses or organomegaly EXT:no erythema, induration, or nodules   Lab Results: Lab Results  Component Value Date   WBC 6.9 03/12/2016   HGB 12.3 03/12/2016   HCT 40.1 03/12/2016   MCV 90.7 03/12/2016   PLT 317 03/12/2016     Chemistry      Component Value Date/Time   NA 139 03/12/2016 1646   K 4.4 03/12/2016 1646   CL 106 03/12/2016 1646   CO2 27 03/12/2016 1646   BUN 22 (H) 03/12/2016 1646   CREATININE 1.12 (H) 03/12/2016 1646      Component Value Date/Time   CALCIUM 9.6 03/12/2016 1646   ALKPHOS 105 02/06/2016 1223   AST 27 02/06/2016 1223   ALT 36 02/06/2016 1223   BILITOT 0.5 02/06/2016 1223       Impression and Plan:  60 year old woman with the following issues:  1. Left lower extremity deep vein thrombosis involving multiple veins as well as bilateral pulmonary emboli diagnosed in February 2017. There is no clear-cut provoking episode that her risk factors include obesity and arthritis and slight decrease in her mobility. She denied any personal history or family  history of thrombosis. She denied any pregnancy complications. She had multiple surgeries in the past including hysterectomy without any previous postoperative thrombosis.  She is status post 6 months of anticoagulation with Xarelto with resolution of her pulmonary embolism and regression of her deep vein thrombosis.  The risks and benefits of continuing Xarelto were discussed today. Complications with long-term anticoagulation including bleeding and anticoagulation interruption before surgery. She is planning to have multiple surgeries including bariatrics, knee and shoulder operation. The risks of discontinuation Xarelto include recurrent deep vein thrombosis and pulmonary embolism.  After discussion today she is agreeable to discontinue Xarelto at this time and she understands that if she develops a recurrent deep vein thrombosis long-term anticoagulation will be needed.  Screening for inherited thrombophilia was also discussed and will be considered if she develops recurrent thrombosis.  2. Bariatric surgery perforation: This anticipated to be in November 2017 and by that time she will be off Xarelto. I have recommended immediate postoperative mobilization as well as DVT prophylaxis utilizing compression stocking as well as prophylactic doses of low molecular weight heparin until she is fully mobilized. No long-term anticoagulation  is recommended.  3. Follow-up: Will be in 4 months to follow on her progress.   Zola Button, MD 9/21/20172:34 PM

## 2016-06-04 NOTE — Telephone Encounter (Signed)
Gave patient avs report and appointments for January  °

## 2016-06-23 ENCOUNTER — Inpatient Hospital Stay: Admit: 2016-06-23 | Payer: MEDICAID | Primary: Family Medicine

## 2016-06-23 ENCOUNTER — Encounter

## 2016-06-23 DIAGNOSIS — Z1231 Encounter for screening mammogram for malignant neoplasm of breast: Secondary | ICD-10-CM

## 2016-07-10 ENCOUNTER — Encounter

## 2016-07-10 ENCOUNTER — Inpatient Hospital Stay: Admit: 2016-07-10 | Payer: MEDICAID | Primary: Family Medicine

## 2016-07-10 DIAGNOSIS — N6489 Other specified disorders of breast: Secondary | ICD-10-CM

## 2016-07-10 DIAGNOSIS — R928 Other abnormal and inconclusive findings on diagnostic imaging of breast: Secondary | ICD-10-CM

## 2016-07-31 DIAGNOSIS — L219 Seborrheic dermatitis, unspecified: Secondary | ICD-10-CM | POA: Insufficient documentation

## 2016-08-27 ENCOUNTER — Encounter (INDEPENDENT_AMBULATORY_CARE_PROVIDER_SITE_OTHER): Payer: Self-pay

## 2016-08-27 ENCOUNTER — Ambulatory Visit (INDEPENDENT_AMBULATORY_CARE_PROVIDER_SITE_OTHER): Payer: Medicaid Other | Admitting: Neurology

## 2016-08-27 DIAGNOSIS — G5712 Meralgia paresthetica, left lower limb: Secondary | ICD-10-CM

## 2016-08-27 DIAGNOSIS — Z0289 Encounter for other administrative examinations: Secondary | ICD-10-CM

## 2016-08-27 NOTE — Progress Notes (Signed)
  [id number] [facility]    Provider:  Dr Jaynee Eagles Referring Provider: Katherina Mires, MD Primary Care Physician:  Suzanna Obey, MD  History:  Erin Vasquez is a 60 y.o. female here as a referral from Dr. Doreene Nest for left thigh paresthesias.  Summary: The Left Lateral Femoral Cutaneous nerve showed no response. All remaining nerves (as indicated in the following tables) were within normal limits.    EMG exam was performed on selected left leg muscles. The following muscles were normal: Iliopsoas, Adductor Magnus, Vastus Medialis, Anterior Tibialis, Medial Gastrocnemius, Biceps Femoris (long head), Gluteus Maximus, left lower lumbosacral paraspinal muscles.    Assessment: Technically difficult exam due to extremely large body habitus. 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  Cc: Dr. Doreene Nest  [facility]    Nerve / Sites Rec. Site Peak Lat Ref.  Amp Ref. Segments Distance    ms ms V V  cm  L Superficial peroneal - Ankle     Lat leg Ankle 3.5 ?4.4 5 ?6 Lat leg - Ankle 14  L Sural - Ankle (Calf)     Calf Ankle 3.0 ?4.4 12 ?6 Calf - Ankle 14  R Lateral femoral cutaneous - Thigh (Inguinal ligament)     A. Ing ligament Thigh 4.1  4  A. Ing ligament - Thigh   L Lateral femoral cutaneous - Thigh (Inguinal ligament)     A. Ing ligament Thigh NR  NR  A. Ing ligament - Thigh              MNC    Nerve / Sites Muscle Latency Ref. Amplitude Ref. Rel Amp Segments Distance Area    ms ms mV mV %  cm mVms  L Tibial - AH     Ankle AH 5.2 ?5.8 4.5 ?4.0 100 Ankle - AH 9 9.0  L Peroneal - EDB     Ankle EDB 5.5 ?6.5 1.1 ?2.0 100 Ankle - EDB 9 3.1         Pop fossa - Ankle           F  Wave    Nerve F Lat Ref.   ms ms  L Peroneal - EDB 30.8 ?56.0         Sarina Ill, MD  [facility] [address] [place], [place] [address]  Phone [phone] Fax [phone]

## 2016-08-31 NOTE — Procedures (Signed)
[id number] [facility]    Provider:  Dr Jaynee Eagles Referring Provider: Katherina Mires, MD Primary Care Physician:  Suzanna Obey, MD  History:  Erin Vasquez is a 60 y.o. female here as a referral from Dr. Doreene Nest for left thigh paresthesias.  Summary: The Left Lateral Femoral Cutaneous nerve showed no response. All remaining nerves (as indicated in the following tables) were within normal limits.    EMG exam was performed on selected left leg muscles. The following muscles were normal: Iliopsoas, Adductor Magnus, Vastus Medialis, Anterior Tibialis, Medial Gastrocnemius, Biceps Femoris (long head), Gluteus Maximus, left lower lumbosacral paraspinal muscles.    Assessment: Technically difficult exam due to extremely large body habitus. 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  Cc: Dr. Doreene Nest  [facility]    Nerve / Sites Rec. Site Peak Lat Ref.  Amp Ref. Segments Distance    ms ms V V  cm  L Superficial peroneal - Ankle     Lat leg Ankle 3.5 ?4.4 5 ?6 Lat leg - Ankle 14  L Sural - Ankle (Calf)     Calf Ankle 3.0 ?4.4 12 ?6 Calf - Ankle 14  R Lateral femoral cutaneous - Thigh (Inguinal ligament)     A. Ing ligament Thigh 4.1  4  A. Ing ligament - Thigh   L Lateral femoral cutaneous - Thigh (Inguinal ligament)     A. Ing ligament Thigh NR  NR  A. Ing ligament - Thigh              MNC    Nerve / Sites Muscle Latency Ref. Amplitude Ref. Rel Amp Segments Distance Area    ms ms mV mV %  cm mVms  L Tibial - AH     Ankle AH 5.2 ?5.8 4.5 ?4.0 100 Ankle - AH 9 9.0  L Peroneal - EDB     Ankle EDB 5.5 ?6.5 1.1 ?2.0 100 Ankle - EDB 9 3.1         Pop fossa - Ankle           F  Wave    Nerve F Lat Ref.   ms ms  L Peroneal - EDB 30.8 ?56.0         Sarina Ill, MD  [facility] [address] [place], [place] [address]  Phone [phone] Fax [phone]

## 2016-09-01 DIAGNOSIS — G5712 Meralgia paresthetica, left lower limb: Secondary | ICD-10-CM | POA: Insufficient documentation

## 2016-09-24 ENCOUNTER — Telehealth: Payer: Self-pay | Admitting: Neurology

## 2016-09-24 NOTE — Telephone Encounter (Signed)
Patient is calling in reference to nerve conduction study completed on 08/27/16.  Patient reached out to her PCP and they advised patient to call her to our office due to having left leg numbness knee up toward groin area.  Please call

## 2016-09-24 NOTE — Telephone Encounter (Signed)
Dr Jaynee Eagles- looks like you only saw this patient for the EMG/NCS. How would you like her to proceed? She has never had an office visit with you

## 2016-09-24 NOTE — Telephone Encounter (Addendum)
Called and spoke to pt. Relayed Dr Cathren Laine message below. She verbalized understanding. She will come today to sign release and pick up copy of results. I placed release form w/ results up front for pt to sign/pick-up. She requested I fax a copy of results to Dr Doreene Nest, referring provider as well. She will follow up with them about next steps to take.   Faxed results to DR Doreene Nest at [phone]. Received confirmation.

## 2016-09-24 NOTE — Telephone Encounter (Signed)
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 But I am not the treating physician so she has to be treated from the doctor who ordered the test. thanks

## 2016-10-01 ENCOUNTER — Ambulatory Visit: Payer: Medicaid Other | Admitting: Oncology

## 2016-10-01 ENCOUNTER — Telehealth: Payer: Self-pay | Admitting: Oncology

## 2016-10-01 NOTE — Telephone Encounter (Signed)
Patient called to cancel due to inclement weather and to reschedule call back is [phone]

## 2016-10-20 ENCOUNTER — Telehealth: Payer: Self-pay | Admitting: Oncology

## 2016-10-20 NOTE — Telephone Encounter (Signed)
Appointment rescheduled per patient request. Patient notified.

## 2016-11-02 DIAGNOSIS — K2100 Gastro-esophageal reflux disease with esophagitis, without bleeding: Secondary | ICD-10-CM | POA: Insufficient documentation

## 2016-11-03 DIAGNOSIS — I10 Essential (primary) hypertension: Secondary | ICD-10-CM | POA: Insufficient documentation

## 2016-11-12 HISTORY — PX: ROUX-EN-Y GASTRIC BYPASS: SHX1104

## 2016-11-19 ENCOUNTER — Ambulatory Visit (HOSPITAL_BASED_OUTPATIENT_CLINIC_OR_DEPARTMENT_OTHER): Payer: Medicaid Other | Admitting: Oncology

## 2016-11-19 VITALS — BP 144/66 | HR 70 | Temp 97.9°F | Resp 18 | Ht 63.0 in | Wt 279.9 lb

## 2016-11-19 DIAGNOSIS — I2699 Other pulmonary embolism without acute cor pulmonale: Secondary | ICD-10-CM

## 2016-11-19 DIAGNOSIS — I82409 Acute embolism and thrombosis of unspecified deep veins of unspecified lower extremity: Secondary | ICD-10-CM | POA: Diagnosis not present

## 2016-11-19 NOTE — Progress Notes (Signed)
Hematology and Oncology Follow Up Visit  NIKIE CID [id number] 12-Feb-1956 61 y.o. 11/19/2016 1:43 PM Suzanna Obey, MDBriscoe, [name], MD   Principle Diagnosis: 61 year old with left lower extremity deep vein thrombosis and pulmonary emboli diagnosed in February 2017. That episode was presumably provoked by immobility, obesity and potential trauma to her left leg.    Current therapy:  Xarelto 20 mg daily since February 2017. She completed 6 months of therapy and discontinued after that.   Interim History: Ms. Bice presents today for a follow-up visit. Since the last visit, she reports no major changes in her health. She is currently under evaluation to undergo bariatric surgery. She denied any recent thrombosis or bleeding episodes. She had been off Xarelto without any complications. She does report some mild lower extremity swelling on the left but no pain or discomfort.  She denied any headaches blurred vision, syncope or seizures. She denied any fevers, chills, sweats or weight loss. She denies any chest pain, difficulty breathing, cough or wheezing. She denied any hemoptysis or hematemesis. She does have residual lower extremity edema but no palpitation, orthopnea or dyspnea exertion. She does not report any nausea, vomiting or abdominal pain. She does not report any constipation or diarrhea. She does not report any frequency urgency or hesitancy. Remaining review of systems unremarkable.  Medications: I have reviewed the patient's current medications.  Current Outpatient Prescriptions  Medication Sig Dispense Refill  . AMLODIPINE BESYLATE PO Take 10 mg by mouth at bedtime.    . Cholecalciferol (VITAMIN D3) 5000 units TABS Take 1 tablet by mouth daily.    Marland Kitchen levothyroxine (SYNTHROID, LEVOTHROID) 50 MCG tablet Take 50 mcg by mouth daily before breakfast.     . omeprazole (PRILOSEC) 40 MG capsule Take by mouth.    . pseudoephedrine (SUDAFED) 30 MG tablet Take by mouth.    . ranitidine  (ZANTAC) 150 MG tablet Take 150 mg by mouth 2 (two) times daily.     No current facility-administered medications for this visit.      Allergies:  Allergies  Allergen Reactions  . Bee Venom Swelling and Other (See Comments)    Patient is allergic to bee venom, mosquitos, wasps, yellow jackets and bees. Wherever she gets stung that area swells, turns hot and has fevers.    . Other Anaphylaxis and Shortness Of Breath    Hickory smoke  . Food Other (See Comments)    Tangerines. Unknown childhood allergy.   . Morphine And Related Itching  . Penicillins Other (See Comments)    UNKNOWN: CHILDHOOD ALLERGY Has patient had a PCN reaction causing immediate rash, facial/tongue/throat swelling, SOB or lightheadedness with hypotension: YES Has patient had a PCN reaction causing severe rash involving mucus membranes or skin necrosis: NO Has patient had a PCN reaction that required hospitalization NO Has patient had a PCN reaction occurring within the last 10 years: NO If all of the above answers are "NO", then may proceed with Cephalosporin use.    Past Medical History, Surgical history, Social history, and Family History were reviewed and updated.  Physical Exam: Blood pressure (!) 144/66, pulse 70, temperature 97.9 F (36.6 C), temperature source Oral, resp. rate 18, height 5\' 3"  (1.6 m), weight 279 lb 14.4 oz (127 kg), SpO2 97 %. ECOG: 1 General appearance: alert and cooperative appeared without distress. Head: Normocephalic, without obvious abnormality Neck: no adenopathy Lymph nodes: Cervical, supraclavicular, and axillary nodes normal. Heart:regular rate and rhythm, S1, S2 normal, no murmur, click, rub or gallop  Lung:chest clear, no wheezing, rales, normal symmetric air entry Abdomin: soft, non-tender, without masses or organomegaly EXT:no erythema, induration, or nodules   Lab Results: Lab Results  Component Value Date   WBC 6.9 03/12/2016   HGB 12.3 03/12/2016   HCT 40.1  03/12/2016   MCV 90.7 03/12/2016   PLT 317 03/12/2016     Chemistry      Component Value Date/Time   NA 139 03/12/2016 1646   K 4.4 03/12/2016 1646   CL 106 03/12/2016 1646   CO2 27 03/12/2016 1646   BUN 22 (H) 03/12/2016 1646   CREATININE 1.12 (H) 03/12/2016 1646      Component Value Date/Time   CALCIUM 9.6 03/12/2016 1646   ALKPHOS 105 02/06/2016 1223   AST 27 02/06/2016 1223   ALT 36 02/06/2016 1223   BILITOT 0.5 02/06/2016 1223       Impression and Plan:  61 year old woman with the following issues:  1. Left lower extremity deep vein thrombosis involving multiple veins as well as bilateral pulmonary emboli diagnosed in February 2017. There is no clear-cut provoking episode that her risk factors include obesity and arthritis and slight decrease in her mobility. She denied any personal history or family history of thrombosis. She denied any pregnancy complications. She had multiple surgeries in the past including hysterectomy without any previous postoperative thrombosis.  She is status post 6 months of anticoagulation with Xarelto with resolution of her pulmonary embolism and regression of her deep vein thrombosis.  The risks and benefits of continuing Xarelto were discussed Previously and she elected to discontinue Xarelto. She understands that if she develops recurrent deep vein thrombosis she will require lifetime anticoagulation.   2. Bariatric surgery perforation: This anticipated to be completed in near future. I recommended aggressive postoperative mobilization and DVT prophylaxis. She is planning to have Lovenox postoperatively even as an outpatient until she is fully ambulatory.   3. Follow-up: Will be as needed in the future.   Zola Button, MD 3/8/20181:43 PM

## 2016-12-25 DIAGNOSIS — Z903 Acquired absence of stomach [part of]: Secondary | ICD-10-CM

## 2016-12-25 DIAGNOSIS — Z9884 Bariatric surgery status: Secondary | ICD-10-CM | POA: Insufficient documentation

## 2016-12-25 DIAGNOSIS — K912 Postsurgical malabsorption, not elsewhere classified: Secondary | ICD-10-CM | POA: Insufficient documentation

## 2017-01-27 ENCOUNTER — Telehealth: Payer: Self-pay

## 2017-01-27 NOTE — Telephone Encounter (Signed)
Spoke to patient about the PREP at [facility] when she called me after hearing about the program.  She is in need of exercise/lifestyle assistance as well as financial support.  Financial support for the program has been arranged and I have emailed the patient to set up a time we can meet and have her register to get started.  She was very excited about the program and participating in it.

## 2017-01-29 NOTE — Progress Notes (Signed)
[place] Report   Patient Details  Name: Erin Vasquez MRN: [id number] Date of Birth: 1955-11-11 Age: 61 y.o. PCP: Katherina Mires, MD  Vitals:   01/29/17 1252  BP: 130/84  Pulse: (!) 59  SpO2: 97%  Weight: 244 lb (110.7 kg)         [facility] Eval - 01/29/17 1200      Referral    Referring Provider self   Reason for referral Hypertension;Inactivity;Obesitity/Overweight;Orthopedic;Other   Program Start Date 01/29/17     Measurement   Waist Circumference 40.5 inches   Hip Circumference 49.5 inches   Body fat --  machine didn't register it     Information for Trainer   Goals swimming exercises, resistance, bike, to tone up weight being lost from bariatric surgery 11/23/16.   Current Exercise walking   Orthopedic Concerns RT knee, LT rotator cuff,    Pertinent Medical History hernia, blood clots, bunions, arthritis   Current Barriers RT knee, lt shoulder, hernia     Timed Up and Go (TUGS)   Timed Up and Go Moderate risk 10-12 seconds  12.58     Mobility and Daily Activities   I find it easy to walk up or down two or more flights of stairs. 2   I have no trouble taking out the trash. 4   I do housework such as vacuuming and dusting on my own without difficulty. 4   I can easily lift a gallon of milk (8lbs). 4  with RT hand   I can easily walk a mile. 1   I have no trouble reaching into high cupboards or reaching down to pick up something from the floor. 4  with rt hand   I do not have trouble doing out-door work such as Armed forces logistics/support/administrative officer, raking leaves, or gardening. 2     Mobility and Daily Activities   I feel younger than my age. 4   I feel independent. 3   I feel energetic. 3   I live an active life.  3   I feel strong. 2   I feel healthy. 3   I feel active as other people my age. 3     How fit and strong are you.   Fit and Strong Total Score 42     Past Medical History:  Diagnosis Date  . Anemia    during pregnancy  . Arthritis     "all over"  . Bilateral pulmonary embolism ([place]) 10/29/2015  . Childhood asthma   . Chronic back pain   . DVT (deep venous thrombosis) ([place]) 10/29/2015   LLE  . GERD (gastroesophageal reflux disease)    tx. Tums  . H/O hiatal hernia    hx. esophageal dilation x1  . History of esophageal dilatation   . Hypothyroidism   . Sleep apnea    "CPAP ordered; insurance wouldn't cover" (10/29/2015)  . Thyroid disease   . Uterine cancer ([place])    uterine cancer  . Varicose veins   . Vitamin D deficiency    Past Surgical History:  Procedure Laterality Date  . BALLOON DILATION N/A 08/02/2013   Procedure: BALLOON DILATION;  Surgeon: Arta Silence, MD;  Location: [facility];  Service: Endoscopy;  Laterality: N/A;  . BUNIONECTOMY WITH HAMMERTOE RECONSTRUCTION Left ~ 2005  . COLONOSCOPY WITH PROPOFOL N/A 08/02/2013   Procedure: COLONOSCOPY WITH PROPOFOL;  Surgeon: Arta Silence, MD;  Location: [facility];  Service: Endoscopy;  Laterality: N/A;  . DILATION  AND CURETTAGE OF UTERUS  x2   S/P miscarriage  . ESOPHAGOGASTRODUODENOSCOPY (EGD) WITH ESOPHAGEAL DILATION  ~ 2003  . ESOPHAGOGASTRODUODENOSCOPY (EGD) WITH PROPOFOL N/A 08/02/2013   Procedure: ESOPHAGOGASTRODUODENOSCOPY (EGD) WITH PROPOFOL;  Surgeon: Arta Silence, MD;  Location: [facility];  Service: Endoscopy;  Laterality: N/A;  . LAPAROSCOPIC APPENDECTOMY N/A 02/07/2016   Procedure: LAPAROSCOPIC APPENDECTOMY;  Surgeon: Coralie Keens, MD;  Location: [place];  Service: General;  Laterality: N/A;  . ROBOTIC ASSISTED TOTAL HYSTERECTOMY WITH BILATERAL SALPINGO OOPHERECTOMY  01/23/2014   uterine cancer  . SHOULDER ARTHROSCOPY WITH DEBRIDEMENT AND BICEP TENDON REPAIR Left 09/11/2014   Procedure: SHOULDER ARTHROSCOPY WITH DEBRIDEMENT AND BICEP TENDON REPAIR;  Surgeon: Renette Butters, MD;  Location: [place];  Service: Orthopedics;  Laterality: Left;  . SHOULDER ARTHROSCOPY WITH DISTAL CLAVICLE RESECTION Left 09/11/2014   Procedure: SHOULDER  ARTHROSCOPY WITH DISTAL CLAVICLE RESECTION;  Surgeon: Renette Butters, MD;  Location: [place];  Service: Orthopedics;  Laterality: Left;  . SHOULDER ARTHROSCOPY WITH SUBACROMIAL DECOMPRESSION Left 09/11/2014   Procedure: SHOULDER ARTHROSCOPY WITH SUBACROMIAL DECOMPRESSION;  Surgeon: Renette Butters, MD;  Location: [place];  Service: Orthopedics;  Laterality: Left;  . TONSILLECTOMY     History  Smoking Status  . Never Smoker  Smokeless Tobacco  . Former Systems developer  . Types: Chew    Comment: "chewed as a teen     [name] is very excited to start the PREP.  She will start with water exercises she was prescribed by her physical therapist and then work with her trainer once she receives a call from him or her.    [name] 01/29/2017, 1:02 PM

## 2017-02-02 ENCOUNTER — Other Ambulatory Visit: Payer: Self-pay | Admitting: Family Medicine

## 2017-02-02 DIAGNOSIS — Z1231 Encounter for screening mammogram for malignant neoplasm of breast: Secondary | ICD-10-CM

## 2017-02-03 ENCOUNTER — Ambulatory Visit: Payer: Medicaid Other

## 2017-02-10 ENCOUNTER — Ambulatory Visit
Admission: RE | Admit: 2017-02-10 | Discharge: 2017-02-10 | Disposition: A | Discharge status: Home / Self Care | Payer: Medicaid Other | Source: Ambulatory Visit | Attending: Family Medicine | Admitting: Family Medicine

## 2017-02-10 DIAGNOSIS — Z1231 Encounter for screening mammogram for malignant neoplasm of breast: Secondary | ICD-10-CM

## 2017-02-10 NOTE — Progress Notes (Signed)
[facility] PREP Weekly Session   Patient Details  Name: Erin Vasquez MRN: [id number] Date of Birth: 03-30-1956 Age: 61 y.o. PCP: Katherina Mires, MD  Vitals:   02/10/17 1129  Weight: 243 lb 9.6 oz (110.5 kg)        [facility] Weekly seesion - 02/10/17 1100      Weekly Session   Topic Discussed Health habits   Minutes exercised this week 150 minutes  walking   Classes attended to date 1     Fun things you did since last meeting:"baseball & soccer games (grandkids)" Things you are grateful for:"my salvation, family  & life" Barriers:"eating(bariatric surgery)"   [name] 02/10/2017, 11:30 AM

## 2017-02-12 ENCOUNTER — Ambulatory Visit: Payer: Medicaid Other

## 2017-02-19 NOTE — Progress Notes (Signed)
[facility] PREP Weekly Session   Patient Details  Name: Erin Vasquez MRN: [id number] Date of Birth: [birth year] Age: 61 y.o. PCP: Katherina Mires, MD  Vitals:   02/15/17 1142  Weight: 237 lb 9.6 oz (107.8 kg)        [facility] Weekly seesion - 02/19/17 1100      Weekly Session   Topic Discussed Stress management and problem solving   Minutes exercised this week 180 minutes  1 hr cardio/1 hr strength/ 1 hr SS class   Classes attended to date 2     Fun things you did since last meeting:"hoppers game" Things you are grateful for:"family, health" Nutrition celebrations for the week:"trying to add more protein" Barriers:"not eating 5-8 small meals (84-90 proteins & 82 oz water)"  [name] 02/19/2017, 11:43 AM

## 2017-02-23 ENCOUNTER — Other Ambulatory Visit: Payer: Self-pay | Admitting: Surgery

## 2017-02-23 ENCOUNTER — Ambulatory Visit
Admission: RE | Admit: 2017-02-23 | Discharge: 2017-02-23 | Disposition: A | Discharge status: Home / Self Care | Payer: Medicare Other | Source: Ambulatory Visit | Attending: Surgery | Admitting: Surgery

## 2017-02-23 DIAGNOSIS — K439 Ventral hernia without obstruction or gangrene: Secondary | ICD-10-CM

## 2017-02-24 NOTE — Progress Notes (Signed)
[facility] PREP Weekly Session   Patient Details  Name: Erin Vasquez MRN: [id number] Date of Birth: 22-Aug-1956 Age: 61 y.o. PCP: Katherina Mires, MD  Vitals:   02/22/17 0855  Weight: 233 lb 6.4 oz (105.9 kg)        [facility] Weekly seesion - 02/24/17 0800      Weekly Session   Topic Discussed Other  guest speaker   Minutes exercised this week 85 minutes  50cardio/30strength/2flexibility   Classes attended to date 3      Fun things you did since last meeting:"baseball games, graduations, church dinner." Things you are grateful for:"salvation, life, family" Nutrition celebrations for the week:"lost 4.2lbs since last Monday" Barriers:"getting 80-90 proteins & 90-110oz water in"  [name] 02/24/2017, 8:56 AM

## 2017-03-08 NOTE — Progress Notes (Signed)
[facility] PREP Weekly Session   Patient Details  Name: Erin Vasquez MRN: [id number] Date of Birth: Jun 28, 1956 Age: 61 y.o. PCP: Katherina Mires, MD  Vitals:   03/08/17 1144  Weight: 232 lb 6.4 oz (105.4 kg)        [facility] Weekly seesion - 03/08/17 1100      Weekly Session   Topic Discussed Importance of resistance training   Minutes exercised this week 170 minutes  75cardio/95strength   Classes attended to date 4     Fun things you did since last meeting:"family cookout" Things you are grateful for:"family, my faith, health" Nutrition celebrations:"lost a pound" Barriers:"getting in over 500 calories, 60-80 proteins/day"   [name] 03/08/2017, 11:45 AM

## 2017-03-10 NOTE — Progress Notes (Signed)
[facility] PREP Weekly Session   Patient Details  Name: Erin Vasquez MRN: [id number] Date of Birth: Jun 22, 1956 Age: 61 y.o. PCP: Katherina Mires, MD  Vitals:   03/10/17 1016  Weight: 231 lb (104.8 kg)        [facility] Weekly seesion - 03/10/17 1000      Weekly Session   Topic Discussed Expectations and non-scale victories   Minutes exercised this week 0 minutes  "tripped and fell and took it easy"   Classes attended to date 5     Fun things you did since last meeting:"granddaughter bday party, softball games" Things you are grateful for:"faith, family & health" Nutrition celebrations:"still losing weight" Barriers:"getting in proteins & calories & water"   Erin Vasquez 03/10/2017, 10:17 AM

## 2017-03-29 NOTE — Progress Notes (Signed)
[facility] PREP Weekly Session   Patient Details  Name: Erin Vasquez MRN: [id number] Date of Birth: [birth year] Age: 61 y.o. PCP: Katherina Mires, MD  Vitals:   03/22/17 1123  Weight: 226 lb (102.5 kg)        [facility] Weekly seesion - 03/29/17 1100      Weekly Session   Topic Discussed Other  portion control   Minutes exercised this week 390 minutes  260cardio/130strength   Classes attended to date 6     Fun things you did since last meeting:"[place] movie, grandson all star baseball game &grandkids, fireworks." Things you are grateful for:"Faith, health, family, friends, [employer], weight loss" Nutrition celebrations:"eating right" Barriers:"not eating enough"  [name] 03/29/2017, 11:26 AM

## 2017-04-02 NOTE — Progress Notes (Signed)
[facility] PREP Weekly Session   Patient Details  Name: OREE MIRELEZ MRN: [id number] Date of Birth: July 16, 1956 Age: 61 y.o. PCP: Katherina Mires, MD  Vitals:   04/02/17 0104  Weight: 223 lb (101.2 kg)        [facility] Weekly seesion - 04/02/17 0900      Weekly Session   Topic Discussed Finding support   Minutes exercised this week 435 minutes  145cardio/135strength/17flexibility   Classes attended to date 7     Fun things you did since last meeting:"grandkids, family, church" Things you are grateful for:"faith, willpower, family, health" Nutrition celebration for the week:"trying to eat more meals" Barriers:"food & water intake"  [name] 04/02/2017, 9:06 AM

## 2017-04-12 NOTE — Progress Notes (Signed)
[facility] PREP Weekly Session   Patient Details  Name: Erin Vasquez MRN: [id number] Date of Birth: 1956-06-08 Age: 61 y.o. PCP: Katherina Mires, MD  Vitals:   04/12/17 1139  Weight: 222 lb (100.7 kg)        [facility] Weekly seesion - 04/12/17 1100      Weekly Session   Topic Discussed Calorie breakdown   Minutes exercised this week 310 minutes  190cardio/120strength   Classes attended to date 8     Fun things you did since last meeting:"my "Christmas in July  Dinner for church" Things you are grateful for:"Faith, family, health, [name] w/the [employer] PREP class" Nutrition celebrations:"still losing weight" Barriers:"getting calories, proteins & water in daily.Marland Kitchendoing better"   [name] 04/12/2017, 11:42 AM

## 2017-04-14 HISTORY — PX: HERNIA REPAIR: SHX51

## 2017-04-19 NOTE — Progress Notes (Signed)
[facility] PREP Weekly Session   Patient Details  Name: Erin Vasquez MRN: [id number] Date of Birth: 1956-03-18 Age: 61 y.o. PCP: Katherina Mires, MD  Vitals:   04/19/17 0950  Weight: 222 lb (100.7 kg)        [facility] Weekly seesion - 04/19/17 0900      Weekly Session   Topic Discussed Water   Minutes exercised this week 340 minutes  120pool/238machines   Classes attended to date 25     Fun things you did since last meeting:"church-leading prayer group for the [facility]" Things you are grateful for:"faith, family, health, the [employer]" Nutrition celebrations for the week"eating a little more" Barriers:"getting in 80 proteins & enough water each day"   [name] 04/19/2017, 9:51 AM

## 2017-04-21 NOTE — Progress Notes (Signed)
[facility] PREP Weekly Session   Patient Details  Name: Erin Vasquez MRN: [id number] Date of Birth: 1955-12-19 Age: 61 y.o. PCP: Katherina Mires, MD  Vitals:   04/21/17 1354  Weight: 219 lb 6.4 oz (99.5 kg)        [facility] Weekly seesion - 04/21/17 1300      Weekly Session   Topic Discussed Hitting roadblocks   Minutes exercised this week 320 minutes  240cardio/60strength   Classes attended to date 10     Fun things you did since last meeting:"hosted dinner and game night for church friends" Things you are grateful for:"faith, family, friends, health, [name] at the [employer]" Nutrition celebrations for the week:"eating a little more" Barriers:"getting all my proteins & water in daily"   [name] 04/21/2017, 1:56 PM

## 2017-04-21 NOTE — Pre-Procedure Instructions (Signed)
Erin Vasquez  04/21/2017      [facility] [place], [place] - [address] [place] [address] Phone: [phone] Fax: [phone]    Your procedure is scheduled on August 15  Report to [facility] at [place].M.  Call this number if you have problems the morning of surgery:  [phone]   Remember:  Do not eat food or drink liquids after midnight.   Take these medicines the morning of surgery with A SIP OF WATER amLODipine (NORVASC) if normally takes in the morning, levothyroxine (SYNTHROID, LEVOTHROID), omeprazole (PRILOSEC) if takes in the morning, ranitidine (ZANTAC)  7 days prior to surgery STOP taking any Aspirin, Aleve, Naproxen, Ibuprofen, Motrin, Advil, Goody's, BC's, all herbal medications, fish oil, and all vitamins   Do not wear jewelry, make-up or nail polish.  Do not wear lotions, powders, or perfumes, or deoderant.  Do not shave 48 hours prior to surgery.  Men may shave face and neck.  Do not bring valuables to the hospital.  [facility] is not responsible for any belongings or valuables.  Contacts, dentures or bridgework may not be worn into surgery.  Leave your suitcase in the car.  After surgery it may be brought to your room.  For patients admitted to the hospital, discharge time will be determined by your treatment team.  Patients discharged the day of surgery will not be allowed to drive home.    Special instructions:   [facility]- Preparing For Surgery  Before surgery, you can play an important role. Because skin is not sterile, your skin needs to be as free of germs as possible. You can reduce the number of germs on your skin by washing with CHG (chlorahexidine gluconate) Soap before surgery.  CHG is an antiseptic cleaner which kills germs and bonds with the skin to continue killing germs even after washing.  Please do not use if you have an allergy to CHG or antibacterial  soaps. If your skin becomes reddened/irritated stop using the CHG.  Do not shave (including legs and underarms) for at least 48 hours prior to first CHG shower. It is OK to shave your face.  Please follow these instructions carefully.   1. Shower the NIGHT BEFORE SURGERY and the MORNING OF SURGERY with CHG.   2. If you chose to wash your hair, wash your hair first as usual with your normal shampoo.  3. After you shampoo, rinse your hair and body thoroughly to remove the shampoo.  4. Use CHG as you would any other liquid soap. You can apply CHG directly to the skin and wash gently with a scrungie or a clean washcloth.   5. Apply the CHG Soap to your body ONLY FROM THE NECK DOWN.  Do not use on open wounds or open sores. Avoid contact with your eyes, ears, mouth and genitals (private parts). Wash genitals (private parts) with your normal soap.  6. Wash thoroughly, paying special attention to the area where your surgery will be performed.  7. Thoroughly rinse your body with warm water from the neck down.  8. DO NOT shower/wash with your normal soap after using and rinsing off the CHG Soap.  9. Pat yourself dry with a CLEAN TOWEL.   10. Wear CLEAN PAJAMAS   11. Place CLEAN SHEETS on your bed the night of your first shower and DO NOT SLEEP WITH PETS.    Day of Surgery: Do not  apply any deodorants/lotions. Please wear clean clothes to the hospital/surgery center.      Please read over the following fact sheets that you were given.

## 2017-04-22 ENCOUNTER — Other Ambulatory Visit: Payer: Self-pay

## 2017-04-22 ENCOUNTER — Encounter (HOSPITAL_COMMUNITY): Payer: Self-pay

## 2017-04-22 ENCOUNTER — Encounter (HOSPITAL_COMMUNITY)
Admission: RE | Admit: 2017-04-22 | Discharge: 2017-04-22 | Disposition: A | Discharge status: Home / Self Care | Payer: Medicare Other | Source: Ambulatory Visit | Attending: Surgery | Admitting: Surgery

## 2017-04-22 DIAGNOSIS — R001 Bradycardia, unspecified: Secondary | ICD-10-CM | POA: Insufficient documentation

## 2017-04-22 DIAGNOSIS — Z01812 Encounter for preprocedural laboratory examination: Secondary | ICD-10-CM | POA: Insufficient documentation

## 2017-04-22 DIAGNOSIS — Z01810 Encounter for preprocedural cardiovascular examination: Secondary | ICD-10-CM | POA: Insufficient documentation

## 2017-04-22 LAB — BASIC METABOLIC PANEL
ANION GAP: 8 (ref 5–15)
BUN: 7 mg/dL (ref 6–20)
CALCIUM: 9.3 mg/dL (ref 8.9–10.3)
CO2: 28 mmol/L (ref 22–32)
CREATININE: 0.75 mg/dL (ref 0.44–1.00)
Chloride: 107 mmol/L (ref 101–111)
GFR calc Af Amer: >60 mL/min (ref >60)
GLUCOSE: 90 mg/dL (ref 65–99)
Potassium: 3.4 mmol/L — ABNORMAL LOW (ref 3.5–5.1)
Sodium: 143 mmol/L (ref 135–145)

## 2017-04-22 LAB — CBC
HCT: 41.2 % (ref 36.0–46.0)
HEMOGLOBIN: 13.2 g/dL (ref 12.0–15.0)
MCH: 29.6 pg (ref 26.0–34.0)
MCHC: 32 g/dL (ref 30.0–36.0)
MCV: 92.4 fL (ref 78.0–100.0)
PLATELETS: 275 10*3/uL (ref 150–400)
RBC: 4.46 MIL/uL (ref 3.87–5.11)
RDW: 14.5 % (ref 11.5–15.5)
WBC: 5.3 10*3/uL (ref 4.0–10.5)

## 2017-04-22 LAB — SURGICAL PCR SCREEN
MRSA, PCR: NEGATIVE
STAPHYLOCOCCUS AUREUS: NEGATIVE

## 2017-04-22 NOTE — Progress Notes (Signed)
Requested via fax a sleep study from [facility]. Pt. Offers no complaints regarding her chest today, denies flu like symptoms. Pt. Followed by Dr. Doreene Nest with [facility].

## 2017-04-27 MED ORDER — SODIUM CHLORIDE 0.9 % IJ SOLN
INTRAMUSCULAR | Status: AC
  Filled 2017-04-27: qty 10

## 2017-04-27 MED ORDER — METHYLENE BLUE 0.5 % INJ SOLN
INTRAVENOUS | Status: AC
  Filled 2017-04-27: qty 10

## 2017-04-27 MED ORDER — LIDOCAINE HCL (PF) 1 % IJ SOLN
INTRAMUSCULAR | Status: AC
  Filled 2017-04-27: qty 30

## 2017-04-27 MED ORDER — BUPIVACAINE-EPINEPHRINE (PF) 0.25% -1:200000 IJ SOLN
INTRAMUSCULAR | Status: AC
  Filled 2017-04-27: qty 30

## 2017-04-27 NOTE — H&P (Signed)
Erin Vasquez  [facility] Patient #: [id number] DOB: 25-Oct-1955 Single / Language: Cleophus Molt / Race: White Female   History of Present Illness   The patient is a 61 year old female who presents with an abdominal wall hernia. This is a pleasant patient that I operated on last year for perforated appendicitis. On her preoperative CT scan, she had a small ventral hernia in her left lateral abdomen containing only omentum. We could not repair this at the time of the appendectomy. She has since had gastric bypass surgery. Now she has lost a considerable amount of weight and can feel the hernia in the left abdomen. It causes no discomfort. She is concerned however that she may have broken needle in her subcutaneous tissue after injecting herself the Lovenox. She has an area of burning sensation in the left lower quadrant especially when she touches the area. She is no longer on blood thinning medication  Past Medical History  Diagnosis Date  . Thyroid disease   . Hypothyroidism   . GERD (gastroesophageal reflux disease)     tx. Tums  . H/O hiatal hernia     hx. esophageal dilation x1  . Vitamin D deficiency   . Anemia     during pregnancy  . DVT (deep venous thrombosis) ([place]) 10/29/2015    LLE  . Bilateral pulmonary embolism ([place]) 10/29/2015  . Childhood asthma   . Sleep apnea     "CPAP ordered; insurance wouldn't cover" (10/29/2015)  . History of esophageal dilatation   . Arthritis     "all over"  . Chronic back pain   . Uterine cancer ([place])     uterine cancer  . Varicose veins           Past Surgical History  Procedure Laterality Date  . Bunionectomy with hammertoe reconstruction Left ~ 2005  . Tonsillectomy    . Esophagogastroduodenoscopy (egd) with propofol N/A 08/02/2013    Procedure: ESOPHAGOGASTRODUODENOSCOPY (EGD) WITH PROPOFOL;  Surgeon: Arta Silence, MD;  Location: [facility];  Service: Endoscopy;   Laterality: N/A;  . Balloon dilation N/A 08/02/2013    Procedure: BALLOON DILATION;  Surgeon: Arta Silence, MD;  Location: [facility];  Service: Endoscopy;  Laterality: N/A;  . Colonoscopy with propofol N/A 08/02/2013    Procedure: COLONOSCOPY WITH PROPOFOL;  Surgeon: Arta Silence, MD;  Location: [facility];  Service: Endoscopy;  Laterality: N/A;  . Shoulder arthroscopy with debridement and bicep tendon repair Left 09/11/2014    Procedure: SHOULDER ARTHROSCOPY WITH DEBRIDEMENT AND BICEP TENDON REPAIR;  Surgeon: Renette Butters, MD;  Location: [facility];  Service: Orthopedics;  Laterality: Left;  . Shoulder arthroscopy with distal clavicle resection Left 09/11/2014    Procedure: SHOULDER ARTHROSCOPY WITH DISTAL CLAVICLE RESECTION;  Surgeon: Renette Butters, MD;  Location: [facility];  Service: Orthopedics;  Laterality: Left;  . Shoulder arthroscopy with subacromial decompression Left 09/11/2014    Procedure: SHOULDER ARTHROSCOPY WITH SUBACROMIAL DECOMPRESSION;  Surgeon: Renette Butters, MD;  Location: [facility];  Service: Orthopedics;  Laterality: Left;  . Abdominal hysterectomy  01/23/2014    robotic surgery for uterine cancer  . Dilation and curettage of uterus  X 2    S/P miscarriage  . Esophagogastroduodenoscopy (egd) with esophageal dilation  ~ 2003         Family History  Problem Relation Age of Onset  . Transient ischemic attack Mother   . Heart attack Father   . Diabetes type II Other   . Lung  cancer Maternal Grandmother    Social History:  reports that she has never smoked. She has quit using smokeless tobacco. Her smokeless tobacco use included Chew. She reports that she drinks alcohol. She reports that she does not use illicit drugs.  Allergies  PenicillAMINE *ASSORTED CLASSES*  Morphine Sulfate (Concentrate) *ANALGESICS - OPIOID*   Medication History   Multiple Vitamin (Oral) Active. Ursodiol (250MG  Tablet, Oral) Active. AmLODIPine Besylate  (10MG  Tablet, Oral) Active. Ranitidine Acid Reducer (75MG  Tablet, Oral) Active. Levothyroxine Sodium (50MCG Tablet, Oral) Active. Vitamin D (2000UNIT Capsule, Oral) Active. Ondansetron (4MG  Tablet Disint, Oral) Active. Medications Reconciled  Vitals   Weight: 233.13 lb Height: 65in Body Surface Area: 2.11 m Body Mass Index: 38.79 kg/m  Temp.: 98.9F(Oral)  Pulse: 71 (Regular)  BP: 104/64 (Sitting, Left Arm, Standard)       Physical Exam   The physical exam findings are as follows: Note:On examination, the hernia in the left abdomen is now easy to palpate. It is chronically incarcerated with fat and is nontender. I cannot palpate a foreign body in her left lower quadrant. She does have signs of her Lovenox injections. General: well in appearance Lungs: CTA bilaterally CV: RRR Skin without rash Ext normal    Assessment & Plan   VENTRAL HERNIA (K43.9)  Impression: We will now schedule her for a ventral hernia repair with mesh as an outpatient for this small hernia to keep it from getting larger. I will get a 2 view abdominal x-ray preoperatively to look for the foreign body may be present in her abdominal wall if the needle did break L. I discussed the surgical procedure with her in detail. I discussed the risk of surgery which includes but is not limited to bleeding, infection, use of mesh, hernia recurrence, need for further surgery, injury to surrounding structures, cardiopulmonary she's, DVT, etc. She understands and agrees to proceed with surgery

## 2017-04-28 ENCOUNTER — Encounter (HOSPITAL_COMMUNITY)
Admission: RE | Disposition: A | Discharge status: Home / Self Care | Payer: Self-pay | Source: Ambulatory Visit | Attending: Surgery

## 2017-04-28 ENCOUNTER — Encounter (HOSPITAL_COMMUNITY): Payer: Self-pay

## 2017-04-28 ENCOUNTER — Ambulatory Visit (HOSPITAL_COMMUNITY): Payer: Medicare Other | Admitting: Certified Registered Nurse Anesthetist

## 2017-04-28 ENCOUNTER — Ambulatory Visit (HOSPITAL_COMMUNITY)
Admission: RE | Admit: 2017-04-28 | Discharge: 2017-04-28 | Disposition: A | Discharge status: Home / Self Care | Payer: Medicare Other | Source: Ambulatory Visit | Attending: Surgery | Admitting: Surgery

## 2017-04-28 DIAGNOSIS — K439 Ventral hernia without obstruction or gangrene: Secondary | ICD-10-CM | POA: Diagnosis present

## 2017-04-28 DIAGNOSIS — Z885 Allergy status to narcotic agent status: Secondary | ICD-10-CM | POA: Insufficient documentation

## 2017-04-28 DIAGNOSIS — F17220 Nicotine dependence, chewing tobacco, uncomplicated: Secondary | ICD-10-CM | POA: Insufficient documentation

## 2017-04-28 DIAGNOSIS — Z880 Allergy status to penicillin: Secondary | ICD-10-CM | POA: Diagnosis not present

## 2017-04-28 DIAGNOSIS — Z86718 Personal history of other venous thrombosis and embolism: Secondary | ICD-10-CM | POA: Diagnosis not present

## 2017-04-28 DIAGNOSIS — Z8542 Personal history of malignant neoplasm of other parts of uterus: Secondary | ICD-10-CM | POA: Diagnosis not present

## 2017-04-28 DIAGNOSIS — I10 Essential (primary) hypertension: Secondary | ICD-10-CM | POA: Insufficient documentation

## 2017-04-28 DIAGNOSIS — Z9884 Bariatric surgery status: Secondary | ICD-10-CM | POA: Insufficient documentation

## 2017-04-28 DIAGNOSIS — Z86711 Personal history of pulmonary embolism: Secondary | ICD-10-CM | POA: Insufficient documentation

## 2017-04-28 DIAGNOSIS — E039 Hypothyroidism, unspecified: Secondary | ICD-10-CM | POA: Diagnosis not present

## 2017-04-28 HISTORY — PX: VENTRAL HERNIA REPAIR: SHX424

## 2017-04-28 HISTORY — PX: INSERTION OF MESH: SHX5868

## 2017-04-28 HISTORY — DX: Ventral hernia without obstruction or gangrene: K43.9

## 2017-04-28 HISTORY — DX: Myoneural disorder, unspecified: G70.9

## 2017-04-28 HISTORY — DX: Essential (primary) hypertension: I10

## 2017-04-28 SURGERY — REPAIR, HERNIA, VENTRAL
Anesthesia: General | Site: Abdomen

## 2017-04-28 MED ORDER — PROPOFOL 10 MG/ML IV BOLUS
INTRAVENOUS | Status: DC | PRN
  Administered 2017-04-28: 120 mg via INTRAVENOUS

## 2017-04-28 MED ORDER — ROCURONIUM BROMIDE 50 MG/5ML IV SOSY
PREFILLED_SYRINGE | INTRAVENOUS | Status: DC | PRN
  Administered 2017-04-28: 50 mg via INTRAVENOUS

## 2017-04-28 MED ORDER — CIPROFLOXACIN IN D5W 400 MG/200ML IV SOLN
400.0000 mg | INTRAVENOUS | Status: AC
  Administered 2017-04-28: 400 mg via INTRAVENOUS
  Filled 2017-04-28: qty 200

## 2017-04-28 MED ORDER — ONDANSETRON HCL 4 MG/2ML IJ SOLN
INTRAMUSCULAR | Status: AC
  Filled 2017-04-28: qty 2

## 2017-04-28 MED ORDER — FENTANYL CITRATE (PF) 100 MCG/2ML IJ SOLN
INTRAMUSCULAR | Status: DC | PRN
  Administered 2017-04-28 (×3): 50 ug via INTRAVENOUS

## 2017-04-28 MED ORDER — SUGAMMADEX SODIUM 200 MG/2ML IV SOLN
INTRAVENOUS | Status: DC | PRN
  Administered 2017-04-28: 200 mg via INTRAVENOUS

## 2017-04-28 MED ORDER — CHLORHEXIDINE GLUCONATE CLOTH 2 % EX PADS: 6.0000 | MEDICATED_PAD | Freq: Once | CUTANEOUS | Status: DC

## 2017-04-28 MED ORDER — OXYCODONE HCL 5 MG PO TABS: 5.0000 mg | ORAL_TABLET | Freq: Once | ORAL | Status: DC | PRN

## 2017-04-28 MED ORDER — FENTANYL CITRATE (PF) 100 MCG/2ML IJ SOLN: 25.0000 ug | INTRAMUSCULAR | Status: DC | PRN

## 2017-04-28 MED ORDER — SODIUM CHLORIDE 0.9% FLUSH: 3.0000 mL | Freq: Two times a day (BID) | INTRAVENOUS | Status: DC

## 2017-04-28 MED ORDER — LACTATED RINGERS IV SOLN
INTRAVENOUS | Status: DC | PRN
  Administered 2017-04-28: 08:00:00 via INTRAVENOUS

## 2017-04-28 MED ORDER — MIDAZOLAM HCL 5 MG/5ML IJ SOLN
INTRAMUSCULAR | Status: DC | PRN
  Administered 2017-04-28: 2 mg via INTRAVENOUS

## 2017-04-28 MED ORDER — PROPOFOL 10 MG/ML IV BOLUS
INTRAVENOUS | Status: AC
  Filled 2017-04-28: qty 20

## 2017-04-28 MED ORDER — BUPIVACAINE HCL (PF) 0.5 % IJ SOLN
INTRAMUSCULAR | Status: DC | PRN
  Administered 2017-04-28: 20 mL

## 2017-04-28 MED ORDER — ONDANSETRON HCL 4 MG/2ML IJ SOLN
INTRAMUSCULAR | Status: DC | PRN
  Administered 2017-04-28: 4 mg via INTRAVENOUS

## 2017-04-28 MED ORDER — LIDOCAINE 2% (20 MG/ML) 5 ML SYRINGE
INTRAMUSCULAR | Status: DC | PRN
  Administered 2017-04-28: 80 mg via INTRAVENOUS

## 2017-04-28 MED ORDER — ACETAMINOPHEN 325 MG PO TABS: 650.0000 mg | ORAL_TABLET | ORAL | Status: DC | PRN

## 2017-04-28 MED ORDER — SODIUM CHLORIDE 0.9% FLUSH: 3.0000 mL | INTRAVENOUS | Status: DC | PRN

## 2017-04-28 MED ORDER — ROCURONIUM BROMIDE 10 MG/ML (PF) SYRINGE
PREFILLED_SYRINGE | INTRAVENOUS | Status: AC
  Filled 2017-04-28: qty 5

## 2017-04-28 MED ORDER — DEXAMETHASONE SODIUM PHOSPHATE 10 MG/ML IJ SOLN
INTRAMUSCULAR | Status: DC | PRN
  Administered 2017-04-28: 10 mg via INTRAVENOUS

## 2017-04-28 MED ORDER — OXYCODONE HCL 5 MG/5ML PO SOLN: 5.0000 mg | Freq: Once | ORAL | Status: DC | PRN

## 2017-04-28 MED ORDER — MIDAZOLAM HCL 2 MG/2ML IJ SOLN
INTRAMUSCULAR | Status: AC
  Filled 2017-04-28: qty 2

## 2017-04-28 MED ORDER — FENTANYL CITRATE (PF) 250 MCG/5ML IJ SOLN
INTRAMUSCULAR | Status: AC
  Filled 2017-04-28: qty 5

## 2017-04-28 MED ORDER — BUPIVACAINE LIPOSOME 1.3 % IJ SUSP
20.0000 mL | Freq: Once | INTRAMUSCULAR | Status: DC
  Filled 2017-04-28: qty 20

## 2017-04-28 MED ORDER — SODIUM CHLORIDE 0.9 % IV SOLN: 250.0000 mL | INTRAVENOUS | Status: DC | PRN

## 2017-04-28 MED ORDER — LIDOCAINE 2% (20 MG/ML) 5 ML SYRINGE
INTRAMUSCULAR | Status: AC
  Filled 2017-04-28: qty 5

## 2017-04-28 MED ORDER — OXYCODONE HCL 5 MG PO TABS: 5.0000 mg | ORAL_TABLET | ORAL | Status: DC | PRN

## 2017-04-28 MED ORDER — DEXAMETHASONE SODIUM PHOSPHATE 10 MG/ML IJ SOLN
INTRAMUSCULAR | Status: AC
  Filled 2017-04-28: qty 1

## 2017-04-28 MED ORDER — ACETAMINOPHEN 650 MG RE SUPP: 650.0000 mg | RECTAL | Status: DC | PRN

## 2017-04-28 MED ORDER — HYDROCODONE-ACETAMINOPHEN 5-325 MG PO TABS: 1.0000 | ORAL_TABLET | Freq: Four times a day (QID) | ORAL | 0 refills | Status: DC | PRN

## 2017-04-28 MED ORDER — BUPIVACAINE HCL (PF) 0.5 % IJ SOLN
INTRAMUSCULAR | Status: AC
  Filled 2017-04-28: qty 30

## 2017-04-28 MED ORDER — 0.9 % SODIUM CHLORIDE (POUR BTL) OPTIME
TOPICAL | Status: DC | PRN
  Administered 2017-04-28: 1000 mL

## 2017-04-28 SURGICAL SUPPLY — 39 items
BINDER ABDOMINAL 12 ML 46-62 (SOFTGOODS) ×3 IMPLANT
CANISTER SUCT 3000ML PPV (MISCELLANEOUS) ×3 IMPLANT
CHLORAPREP W/TINT 26ML (MISCELLANEOUS) ×3 IMPLANT
COVER SURGICAL LIGHT HANDLE (MISCELLANEOUS) ×3 IMPLANT
DERMABOND ADVANCED (GAUZE/BANDAGES/DRESSINGS) ×2
DERMABOND ADVANCED .7 DNX12 (GAUZE/BANDAGES/DRESSINGS) ×1 IMPLANT
DRAPE LAPAROSCOPIC ABDOMINAL (DRAPES) ×3 IMPLANT
DRAPE UTILITY XL STRL (DRAPES) ×6 IMPLANT
DRSG TEGADERM 4X4.75 (GAUZE/BANDAGES/DRESSINGS) ×3 IMPLANT
DRSG TELFA 3X8 NADH (GAUZE/BANDAGES/DRESSINGS) IMPLANT
ELECT CAUTERY BLADE 6.4 (BLADE) ×3 IMPLANT
ELECT REM PT RETURN 9FT ADLT (ELECTROSURGICAL) ×3
ELECTRODE REM PT RTRN 9FT ADLT (ELECTROSURGICAL) ×1 IMPLANT
GAUZE SPONGE 4X4 12PLY STRL (GAUZE/BANDAGES/DRESSINGS) ×3 IMPLANT
GLOVE BIOGEL PI IND STRL 6.5 (GLOVE) ×1 IMPLANT
GLOVE BIOGEL PI INDICATOR 6.5 (GLOVE) ×2
GLOVE SURG SIGNA 7.5 PF LTX (GLOVE) ×3 IMPLANT
GLOVE SURG SS PI 6.0 STRL IVOR (GLOVE) ×3 IMPLANT
GOWN STRL REUS W/ TWL LRG LVL3 (GOWN DISPOSABLE) ×2 IMPLANT
GOWN STRL REUS W/ TWL XL LVL3 (GOWN DISPOSABLE) ×1 IMPLANT
GOWN STRL REUS W/TWL LRG LVL3 (GOWN DISPOSABLE) ×4
GOWN STRL REUS W/TWL XL LVL3 (GOWN DISPOSABLE) ×2
KIT BASIN OR (CUSTOM PROCEDURE TRAY) ×3 IMPLANT
KIT ROOM TURNOVER OR (KITS) ×3 IMPLANT
MESH VENTRALEX ST 2.5 CRC MED (Mesh General) ×3 IMPLANT
NEEDLE HYPO 25GX1X1/2 BEV (NEEDLE) ×3 IMPLANT
NS IRRIG 1000ML POUR BTL (IV SOLUTION) ×3 IMPLANT
PACK GENERAL/GYN (CUSTOM PROCEDURE TRAY) ×3 IMPLANT
PAD ARMBOARD 7.5X6 YLW CONV (MISCELLANEOUS) ×3 IMPLANT
STAPLER VISISTAT 35W (STAPLE) IMPLANT
SUT MNCRL AB 4-0 PS2 18 (SUTURE) ×3 IMPLANT
SUT NOVA 1 T20/GS 25DT (SUTURE) ×3 IMPLANT
SUT NOVA NAB DX-16 0-1 5-0 T12 (SUTURE) ×3 IMPLANT
SUT PROLENE 1 CT (SUTURE) IMPLANT
SUT VIC AB 3-0 SH 27 (SUTURE) ×2
SUT VIC AB 3-0 SH 27XBRD (SUTURE) ×1 IMPLANT
SYR CONTROL 10ML LL (SYRINGE) ×3 IMPLANT
TOWEL OR 17X24 6PK STRL BLUE (TOWEL DISPOSABLE) ×3 IMPLANT
TOWEL OR 17X26 10 PK STRL BLUE (TOWEL DISPOSABLE) ×3 IMPLANT

## 2017-04-28 NOTE — Interval H&P Note (Signed)
History and Physical Interval Note: no change in H and P  04/28/2017 7:32 AM  Erin Vasquez  has presented today for surgery, with the diagnosis of ventral hernia  The various methods of treatment have been discussed with the patient and family. After consideration of risks, benefits and other options for treatment, the patient has consented to  Procedure(s): HERNIA REPAIR VENTRAL ADULT (N/A) POSSIBLE INSERTION OF MESH (N/A) as a surgical intervention .  The patient's history has been reviewed, patient examined, no change in status, stable for surgery.  I have reviewed the patient's chart and labs.  Questions were answered to the patient's satisfaction.     Tuff Clabo A

## 2017-04-28 NOTE — Anesthesia Postprocedure Evaluation (Signed)
Anesthesia Post Note  Patient: Erin Vasquez  Procedure(s) Performed: Procedure(s) (LRB): HERNIA REPAIR VENTRAL ADULT (N/A) POSSIBLE INSERTION OF MESH (N/A)     Patient location during evaluation: PACU Anesthesia Type: General Level of consciousness: awake and alert Pain management: pain level controlled Vital Signs Assessment: post-procedure vital signs reviewed and stable Respiratory status: spontaneous breathing, nonlabored ventilation, respiratory function stable and patient connected to nasal cannula oxygen Cardiovascular status: blood pressure returned to baseline and stable Postop Assessment: no signs of nausea or vomiting Anesthetic complications: no    Last Vitals:  Vitals:   04/28/17 1030 04/28/17 1056  BP:  97/73  Pulse: (!) 51   Resp: 16   Temp: (!) 36.1 C   SpO2: 93% 95%    Last Pain:  Vitals:   04/28/17 0704  TempSrc: Oral                 Kishaun Erekson

## 2017-04-28 NOTE — Op Note (Signed)
HERNIA REPAIR VENTRAL ADULT, POSSIBLE INSERTION OF MESH  Procedure Note  Erin Vasquez 04/28/2017   Pre-op Diagnosis: ventral hernia     Post-op Diagnosis: same   Procedure(s): HERNIA REPAIR VENTRAL ADULT POSSIBLE INSERTION OF MESH (6.4 cm round ventral patch)  Surgeon(s): Coralie Keens, MD  Anesthesia: General  Staff:  Circulator: Susann Givens, RN Scrub Person: Thereasa Distance T  Estimated Blood Loss: Minimal               Findings: The patient does not have a lateral wall ventral hernia. A contained only omentum. It was repaired with a 6.4 cm round piece of ventralex ST proline mesh from [facility]  Procedure: The patient was brought to the operating room and identified as the correct patient. She is placed supine on the operating table and general anesthesia was induced. Her abdomen was then prepped and draped in the usual sterile fashion. The hernia was located laterally abdominal wall at the level of the umbilicus. I made a transverse incision over the palpable hernia with a scalpel. I didn't think this down to the hernia sac which I dissected out circumferentially. I then opened up the sac and reduce the omentum back into the abdominal cavity. The actual fascial defect was a proximally 2 cm in size. I brought a 6.4 cm round piece of ventralex ST mesh onto the field. I placed it to the fascia opening and then pulled up against the peritoneum with stay ties. I then sewed the mesh and circumferentially with #1 Novafil sutures. I then cut the stay ties and closed the fascia over the top of the mesh with figure-of-eight #1 Novafil sutures as well. Y coverage of the fascial defect appeared to be achieved. I anesthetized the skin and fascia with Marcaine. I then closed the subjacent tissue with interrupted 3-0 Vicryl sutures and closed the skin with a running 4-0 Monocryl. Dermabond was applied. The patient tolerated procedure well. All the counts were correct at the end of the  procedure. The patient was then extubated in the operating room and taken in a stable condition to the recovery room.          [name] A   Date: 04/28/2017  Time: 9:20 AM

## 2017-04-28 NOTE — Anesthesia Procedure Notes (Signed)
Procedure Name: Intubation Date/Time: 04/28/2017 8:43 AM Performed by: West Pugh Pre-anesthesia Checklist: Patient identified, Emergency Drugs available, Suction available, Patient being monitored and Timeout performed Patient Re-evaluated:Patient Re-evaluated prior to induction Oxygen Delivery Method: Circle system utilized Preoxygenation: Pre-oxygenation with 100% oxygen Induction Type: IV induction Ventilation: Mask ventilation without difficulty Laryngoscope Size: Mac and 3 Grade View: Grade I Tube type: Oral Tube size: 7.0 mm Number of attempts: 1 Airway Equipment and Method: Stylet Placement Confirmation: ETT inserted through vocal cords under direct vision,  positive ETCO2,  CO2 detector and breath sounds checked- equal and bilateral Secured at: 21 cm Tube secured with: Tape Dental Injury: Teeth and Oropharynx as per pre-operative assessment

## 2017-04-28 NOTE — Discharge Instructions (Signed)
[facility] _______[facility]  UMBILICAL OR INGUINAL HERNIA REPAIR: POST OP INSTRUCTIONS  Always review your discharge instruction sheet given to you by the facility where your surgery was performed. IF YOU HAVE DISABILITY OR FAMILY LEAVE FORMS, YOU MUST BRING THEM TO THE OFFICE FOR PROCESSING.   DO NOT GIVE THEM TO YOUR DOCTOR.  1. A  prescription for pain medication may be given to you upon discharge.  Take your pain medication as prescribed, if needed.  If narcotic pain medicine is not needed, then you may take acetaminophen (Tylenol) or ibuprofen (Advil) as needed. 2. Take your usually prescribed medications unless otherwise directed. If you need a refill on your pain medication, please contact your pharmacy.  They will contact our office to request authorization. Prescriptions will not be filled after 5 pm or on week-ends. 3. You should follow a light diet the first 24 hours after arrival home, such as soup and crackers, etc.  Be sure to include lots of fluids daily.  Resume your normal diet the day after surgery. 4.Most patients will experience some swelling and bruising around the umbilicus or in the groin and scrotum.  Ice packs and reclining will help.  Swelling and bruising can take several days to resolve.  6. It is common to experience some constipation if taking pain medication after surgery.  Increasing fluid intake and taking a stool softener (such as Colace) will usually help or prevent this problem from occurring.  A mild laxative (Milk of Magnesia or Miralax) should be taken according to package directions if there are no bowel movements after 48 hours. 7. Unless discharge instructions indicate otherwise, you may remove your bandages 24-48 hours after surgery, and you may shower at that time.  You may have steri-strips (small skin tapes) in place directly over the incision.  These strips should be left on the skin for 7-10 days.  If your surgeon used skin glue on the  incision, you may shower in 24 hours.  The glue will flake off over the next 2-3 weeks.  Any sutures or staples will be removed at the office during your follow-up visit. 8. ACTIVITIES:  You may resume regular (light) daily activities beginning the next day--such as daily self-care, walking, climbing stairs--gradually increasing activities as tolerated.  You may have sexual intercourse when it is comfortable.  Refrain from any heavy lifting or straining until approved by your doctor.  a.You may drive when you are no longer taking prescription pain medication, you can comfortably wear a seatbelt, and you can safely maneuver your car and apply brakes. b.RETURN TO WORK:   _____________________________________________  9.You should see your doctor in the office for a follow-up appointment approximately 2-3 weeks after your surgery.  Make sure that you call for this appointment within a day or two after you arrive home to insure a convenient appointment time. 10.OTHER INSTRUCTIONS: _OK TO SHOWER TOMORROW ICE PACK, IBUPROFEN ALSO FOR PAIN YOU MAY GET IN THE POOL IN ONE WEEK NO LIFTING MORE THAN 15 POUNDS FOR 6 WEEKS________________________    _____________________________________  WHEN TO CALL YOUR DOCTOR: 1. Fever over 101.0 2. Inability to urinate 3. Nausea and/or vomiting 4. Extreme swelling or bruising 5. Continued bleeding from incision. 6. Increased pain, redness, or drainage from the incision  The clinic staff is available to answer your questions during regular business hours.  Please dont hesitate to call and ask to speak to one of the nurses for clinical concerns.  If you have a  medical emergency, go to the nearest emergency room or call 911.  A surgeon from [facility] is always on call at the hospital   [address] [place], [place], [place]  [address] ?  [address], [place], [place]   [id number] [phone] ? [phone] ? FAX [phone] Web site:  [email or web address]

## 2017-04-28 NOTE — Transfer of Care (Signed)
Immediate Anesthesia Transfer of Care Note  Patient: Erin Vasquez  Procedure(s) Performed: Procedure(s): HERNIA REPAIR VENTRAL ADULT (N/A) POSSIBLE INSERTION OF MESH (N/A)  Patient Location: PACU  Anesthesia Type:General  Level of Consciousness:  sedated, patient cooperative and responds to stimulation  Airway & Oxygen Therapy:Patient Spontanous Breathing and Patient connected to face mask oxgen  Post-op Assessment:  Report given to PACU RN and Post -op Vital signs reviewed and stable  Post vital signs:  Reviewed and stable  Last Vitals:  Vitals:   04/28/17 0704  BP: (!) 111/58  Pulse: (!) 55  Resp: 16  Temp: 36.6 C  SpO2: 37%    Complications: No apparent anesthesia complications

## 2017-04-28 NOTE — Anesthesia Preprocedure Evaluation (Signed)
Anesthesia Evaluation  Patient identified by MRN, date of birth, ID band Patient awake    Reviewed: Allergy & Precautions, NPO status , Patient's Chart, lab work & pertinent test results  History of Anesthesia Complications Negative for: history of anesthetic complications  Airway Mallampati: II  TM Distance: >3 FB Neck ROM: Full    Dental  (+) Teeth Intact,    Pulmonary neg shortness of breath, asthma , sleep apnea and Continuous Positive Airway Pressure Ventilation , PE   breath sounds clear to auscultation       Cardiovascular hypertension, Pt. on medications and Pt. on home beta blockers + Peripheral Vascular Disease   Rhythm:Regular     Neuro/Psych  Neuromuscular disease negative psych ROS   GI/Hepatic Neg liver ROS, hiatal hernia, GERD  Medicated and Controlled,  Endo/Other  Hypothyroidism Morbid obesity  Renal/GU negative Renal ROS     Musculoskeletal  (+) Arthritis ,   Abdominal   Peds  Hematology negative hematology ROS (+)   Anesthesia Other Findings H/o PE from left leg DVT, no blood thinners currently, BiPAP at home, h/o bariatric surgery  Reproductive/Obstetrics                             Anesthesia Physical Anesthesia Plan  ASA: III  Anesthesia Plan: General   Post-op Pain Management:    Induction: Intravenous  PONV Risk Score and Plan: 3 and Ondansetron, Dexamethasone and Midazolam  Airway Management Planned: Oral ETT  Additional Equipment: None  Intra-op Plan:   Post-operative Plan: Extubation in OR  Informed Consent: I have reviewed the patients History and Physical, chart, labs and discussed the procedure including the risks, benefits and alternatives for the proposed anesthesia with the patient or authorized representative who has indicated his/her understanding and acceptance.   Dental advisory given  Plan Discussed with: CRNA and Surgeon  Anesthesia  Plan Comments:         Anesthesia Quick Evaluation

## 2017-04-29 ENCOUNTER — Encounter (HOSPITAL_COMMUNITY): Payer: Self-pay | Admitting: Surgery

## 2017-04-30 NOTE — Progress Notes (Signed)
[facility] [employer] PREP Weekly Session   Patient Details  Name: SYANN CUPPLES MRN: [id number] Date of Birth: 17-Jun-1956 Age: 61 y.o. PCP: Katherina Mires, MD  Vitals:   04/26/17 1244  Weight: 215 lb 3.2 oz (97.6 kg)        [employer] [employer] Weekly seesion - 04/30/17 1200      Weekly Session   Topic Discussed Health habits   Minutes exercised this week 325 minutes  170cardio/155strength   Classes attended to date 43     Fun things you've done since last meeting:"spent time w/grandkids, breakfast w/friends" Things you are grateful for:"salvation, health, family, home, [facility] [employer]" Nutrition celebrations:"lost 4 lbs since last Monday" Barriers:"getting in enough water & protein"  [name] 04/30/2017, 12:46 PM

## 2017-05-05 ENCOUNTER — Encounter (HOSPITAL_COMMUNITY): Payer: Self-pay | Admitting: Surgery

## 2017-05-07 NOTE — Progress Notes (Signed)
[facility] [employer] PREP Weekly Session   Patient Details  Name: KEYASHA MIAH MRN: [id number] Date of Birth: 10/08/1955 Age: 61 y.o. PCP: Katherina Mires, MD  Vitals:   05/07/17 1233  Weight: 217 lb (98.4 kg)        [facility] [employer] Weekly seesion - 05/07/17 1200      Weekly Session   Topic Discussed Other ways to be active   Minutes exercised this week --  p/o hernia surgery so resting   Classes attended to date 80     Fun things you did since last meeting:"Time w/grandkids, church movie, wedding." Things you are grateful for:"Faith, health, the [facility], Dam[employer] Hiltz [employer] [name], family & friends" Nutrition celebrations: "Trying to keep weight down.  Put on 2 lbs" Barriers:"getting proteins and water in"  [name] 05/07/2017, 12:34 PM

## 2017-05-10 NOTE — Progress Notes (Signed)
[place] Report   Patient Details  Name: Erin Vasquez MRN: [id number] Date of Birth: Jul 13, 1956 Age: 61 y.o. PCP: Katherina Mires, MD  Vitals:   05/10/17 1250  BP: (!) 140/92  Pulse: (!) 59  Resp: 16  SpO2: 97%  Weight: 214 lb 3.2 oz (97.2 kg)         [facility] Eval - 05/10/17 1200      Referral    Referring Provider Dr. Bernardo Heater   Reason for referral Orthopedic;Obesitity/Overweight;Inactivity;Hypertension;High Cholesterol;Cancer     Measurement   Waist Circumference 37.5 inches   Hip Circumference 44 inches   Body fat 47.5 percent     Timed Up and Go (TUGS)   Timed Up and Go Moderate risk 10-12 seconds  9.23 seconds     Mobility and Daily Activities   I find it easy to walk up or down two or more flights of stairs. 3   I have no trouble taking out the trash. 4   I do housework such as vacuuming and dusting on my own without difficulty. 4   I can easily lift a gallon of milk (8lbs). 4   I can easily walk a mile. 3   I have no trouble reaching into high cupboards or reaching down to pick up something from the floor. 4   I do not have trouble doing out-door work such as Armed forces logistics/support/administrative officer, raking leaves, or gardening. 4     Mobility and Daily Activities   I feel younger than my age. 4   I feel independent. 4   I feel energetic. 4   I live an active life.  3   I feel strong. 3   I feel healthy. 4   I feel active as other people my age. 3     How fit and strong are you.   Fit and Strong Total Score 51     Past Medical History:  Diagnosis Date  . Anemia    during pregnancy  . Arthritis    "all over"  . Bilateral pulmonary embolism ([place]) 10/29/2015  . Childhood asthma   . Chronic back pain   . DVT (deep venous thrombosis) ([place]) 10/29/2015   LLE  . GERD (gastroesophageal reflux disease)    tx. Tums  . H/O hiatal hernia    hx. esophageal dilation x1  . History of esophageal dilatation   . Hypertension   . Hypothyroidism   . Lupus     pt. remarks that a hospitalist told her that she has a type of Lupus that involves problems with clotting. - Followed by Dr. Alen Blew   . Neuromuscular disorder ([place])    L thigh - Numbness, nerve damage    . Sleep apnea    "CPAP ordered; insurance wouldn't cover" (10/29/2015), uses CPAP q night   . Thyroid disease   . Uterine cancer ([place])    uterine cancer  . Varicose veins   . Ventral hernia   . Vitamin D deficiency    Past Surgical History:  Procedure Laterality Date  . BALLOON DILATION N/A 08/02/2013   Procedure: BALLOON DILATION;  Surgeon: Arta Silence, MD;  [facility];  Service: Endoscopy;  Laterality: N/A;  . BUNIONECTOMY WITH HAMMERTOE RECONSTRUCTION Left ~ 2005  . COLONOSCOPY WITH PROPOFOL N/A 08/02/2013   Procedure: COLONOSCOPY WITH PROPOFOL;  Surgeon: Arta Silence, MD;  [facility];  Service: Endoscopy;  Laterality: N/A;  . DILATION AND CURETTAGE OF UTERUS  x2  S/P miscarriage  . ESOPHAGOGASTRODUODENOSCOPY (EGD) WITH ESOPHAGEAL DILATION  ~ 2003  . ESOPHAGOGASTRODUODENOSCOPY (EGD) WITH PROPOFOL N/A 08/02/2013   Procedure: ESOPHAGOGASTRODUODENOSCOPY (EGD) WITH PROPOFOL;  Surgeon: Arta Silence, MD;  [facility];  Service: Endoscopy;  Laterality: N/A;  . INSERTION OF MESH N/A 04/28/2017   Procedure: POSSIBLE INSERTION OF MESH;  Surgeon: Coralie Keens, MD;  Location: [place];  Service: General;  Laterality: N/A;  . LAPAROSCOPIC APPENDECTOMY N/A 02/07/2016   Procedure: LAPAROSCOPIC APPENDECTOMY;  Surgeon: Coralie Keens, MD;  Location: [place];  Service: General;  Laterality: N/A;  . LAPAROSCOPIC GASTRIC BYPASS  11/2016   [facility]  . ROBOTIC ASSISTED TOTAL HYSTERECTOMY WITH BILATERAL SALPINGO OOPHERECTOMY  01/23/2014   uterine cancer  . SHOULDER ARTHROSCOPY WITH DEBRIDEMENT AND BICEP TENDON REPAIR Left 09/11/2014   Procedure: SHOULDER ARTHROSCOPY WITH DEBRIDEMENT AND BICEP TENDON REPAIR;  Surgeon: Renette Butters, MD;  Location:  [place];  Service: Orthopedics;  Laterality: Left;  . SHOULDER ARTHROSCOPY WITH DISTAL CLAVICLE RESECTION Left 09/11/2014   Procedure: SHOULDER ARTHROSCOPY WITH DISTAL CLAVICLE RESECTION;  Surgeon: Renette Butters, MD;  Location: [place];  Service: Orthopedics;  Laterality: Left;  . SHOULDER ARTHROSCOPY WITH SUBACROMIAL DECOMPRESSION Left 09/11/2014   Procedure: SHOULDER ARTHROSCOPY WITH SUBACROMIAL DECOMPRESSION;  Surgeon: Renette Butters, MD;  Location: [place];  Service: Orthopedics;  Laterality: Left;  . TONSILLECTOMY    . VENTRAL HERNIA REPAIR N/A 04/28/2017   Procedure: HERNIA REPAIR VENTRAL ADULT;  Surgeon: Coralie Keens, MD;  Location: [place];  Service: General;  Laterality: N/A;   History  Smoking Status  . Never Smoker  Smokeless Tobacco  . Former Systems developer  . Types: Chew    Comment: "chewed as a teen     25 Wellness sessions attended. She did not miss any classes!!  Comments:  Erin Vasquez is very proactive in her health care.  She is motivated to continue this lifestyle and is very motivating to others.  It has been a delight having Erin Vasquez participate in the PREP and she states she will be coming to weekly classes periodically to check in.     Erin Vasquez 05/10/2017, 12:54 PM

## 2017-05-19 ENCOUNTER — Other Ambulatory Visit: Payer: Self-pay | Admitting: Surgery

## 2017-05-19 ENCOUNTER — Ambulatory Visit
Admission: RE | Admit: 2017-05-19 | Discharge: 2017-05-19 | Disposition: A | Discharge status: Home / Self Care | Payer: Medicare Other | Source: Ambulatory Visit | Attending: Surgery | Admitting: Surgery

## 2017-05-19 DIAGNOSIS — K439 Ventral hernia without obstruction or gangrene: Secondary | ICD-10-CM

## 2017-06-21 DIAGNOSIS — M1711 Unilateral primary osteoarthritis, right knee: Secondary | ICD-10-CM | POA: Diagnosis present

## 2017-06-21 DIAGNOSIS — Z86718 Personal history of other venous thrombosis and embolism: Secondary | ICD-10-CM

## 2017-06-21 DIAGNOSIS — Z86711 Personal history of pulmonary embolism: Secondary | ICD-10-CM

## 2017-06-21 NOTE — H&P (Signed)
PREOPERATIVE H&P Patient ID: PANTERA WINTERROWD MRN: [id number] DOB/AGE: 02-21-1956 61 y.o.  Chief Complaint: OA RIGHT KNEE  Planned Procedure Date: 07/06/17 Medical and Cardiac Clearance by Dr. Nathaniel Man    Neuro: Dr. Jaynee Eagles.  Vascular: Dr. Kellie Simmering.  Heme/Onc: Dr. Alen Blew.  Gen. Surgery: Dr. Ninfa Linden  HPI: Erin Vasquez is a 61 y.o. female with a history of gastric bypass, DVT / PE, OSA (not currently on CPAP), GERD, Hypothyroidism, and Left shoulder irreparable RTC s/p debridement, biceps tenotomy, DCE.  She presents for evaluation of OA RIGHT KNEE. The patient has a history of pain and functional disability in the right knee due to arthritis and has failed non-surgical conservative treatments for greater than 12 weeks to include corticosteriod injections, viscosupplementation injections, weight reduction as appropriate and activity modification.  Onset of symptoms was gradual, starting 3 years ago with gradually worsening course since that time.  Patient currently rates pain at 8 out of 10 with activity. Patient has night pain, worsening of pain with activity and weight bearing and pain that interferes with activities of daily living.  Patient has evidence of subchondral sclerosis, periarticular osteophytes and joint space narrowing by imaging studies. There is no active infection.  Past Medical History:  Diagnosis Date  . Anemia    during pregnancy  . Arthritis    "all over"  . Bilateral pulmonary embolism ([place]) 10/29/2015  . Childhood asthma   . Chronic back pain   . DVT (deep venous thrombosis) ([place]) 10/29/2015   LLE  . GERD (gastroesophageal reflux disease)    tx. Tums  . H/O hiatal hernia    hx. esophageal dilation x1  . History of esophageal dilatation   . Hypertension   . Hypothyroidism   . Lupus    pt. remarks that a hospitalist told her that she has a type of Lupus that involves problems with clotting. - Followed by Dr. Alen Blew   . Neuromuscular disorder ([place])    L thigh -  Numbness, nerve damage    . Sleep apnea    "CPAP ordered; insurance wouldn't cover" (10/29/2015), uses CPAP q night   . Thyroid disease   . Uterine cancer ([place])    uterine cancer  . Varicose veins   . Ventral hernia   . Vitamin D deficiency    Past Surgical History:  Procedure Laterality Date  . BALLOON DILATION N/A 08/02/2013   Procedure: BALLOON DILATION;  Surgeon: Arta Silence, MD;  [facility];  Service: Endoscopy;  Laterality: N/A;  . BUNIONECTOMY WITH HAMMERTOE RECONSTRUCTION Left ~ 2005  . COLONOSCOPY WITH PROPOFOL N/A 08/02/2013   Procedure: COLONOSCOPY WITH PROPOFOL;  Surgeon: Arta Silence, MD;  [facility];  Service: Endoscopy;  Laterality: N/A;  . DILATION AND CURETTAGE OF UTERUS  x2   S/P miscarriage  . ESOPHAGOGASTRODUODENOSCOPY (EGD) WITH ESOPHAGEAL DILATION  ~ 2003  . ESOPHAGOGASTRODUODENOSCOPY (EGD) WITH PROPOFOL N/A 08/02/2013   Procedure: ESOPHAGOGASTRODUODENOSCOPY (EGD) WITH PROPOFOL;  Surgeon: Arta Silence, MD;  [facility];  Service: Endoscopy;  Laterality: N/A;  . INSERTION OF MESH N/A 04/28/2017   Procedure: POSSIBLE INSERTION OF MESH;  Surgeon: Coralie Keens, MD;  Location: [place];  Service: General;  Laterality: N/A;  . LAPAROSCOPIC APPENDECTOMY N/A 02/07/2016   Procedure: LAPAROSCOPIC APPENDECTOMY;  Surgeon: Coralie Keens, MD;  Location: [place];  Service: General;  Laterality: N/A;  . LAPAROSCOPIC GASTRIC BYPASS  11/2016   [facility]  . ROBOTIC ASSISTED TOTAL HYSTERECTOMY WITH BILATERAL SALPINGO OOPHERECTOMY  01/23/2014  uterine cancer  . SHOULDER ARTHROSCOPY WITH DEBRIDEMENT AND BICEP TENDON REPAIR Left 09/11/2014   Procedure: SHOULDER ARTHROSCOPY WITH DEBRIDEMENT AND BICEP TENDON REPAIR;  Surgeon: Renette Butters, MD;  Location: [place];  Service: Orthopedics;  Laterality: Left;  . SHOULDER ARTHROSCOPY WITH DISTAL CLAVICLE RESECTION Left 09/11/2014   Procedure: SHOULDER ARTHROSCOPY WITH DISTAL CLAVICLE  RESECTION;  Surgeon: Renette Butters, MD;  Location: [place];  Service: Orthopedics;  Laterality: Left;  . SHOULDER ARTHROSCOPY WITH SUBACROMIAL DECOMPRESSION Left 09/11/2014   Procedure: SHOULDER ARTHROSCOPY WITH SUBACROMIAL DECOMPRESSION;  Surgeon: Renette Butters, MD;  Location: [place];  Service: Orthopedics;  Laterality: Left;  . TONSILLECTOMY    . VENTRAL HERNIA REPAIR N/A 04/28/2017   Procedure: HERNIA REPAIR VENTRAL ADULT;  Surgeon: Coralie Keens, MD;  Location: [place];  Service: General;  Laterality: N/A;   Allergies  Allergen Reactions  . Bee Venom Swelling and Other (See Comments)    Patient is allergic to bee venom, mosquitos, wasps, yellow jackets and bees. Wherever she gets stung that area swells, turns hot and has fevers.    . Other Anaphylaxis and Shortness Of Breath    [place] smoke  . Penicillins Other (See Comments)    PATIENT HAS HAD A PCN REACTION WITH IMMEDIATE RASH, FACIAL/TONGUE/THROAT SWELLING, SOB, OR LIGHTHEADEDNESS WITH HYPOTENSION:  #  #  #  YES  #  #  #   Has patient had a PCN reaction causing severe rash involving mucus membranes or skin necrosis: NO Has patient had a PCN reaction that required hospitalization NO Has patient had a PCN reaction occurring within the last 10 years: NO If all of the above answers are "NO", then may proceed with Cephalosporin use.  . Food Other (See Comments)    Tangerines.  UNSPECIFIED CHILDHOOD REACTION   . Tangerine Flavor     UNSPECIFIED REACTION   . Latex Rash  . Morphine And Related Itching   Prior to Admission medications   Medication Sig Start Date End Date Taking? Authorizing Provider  amLODipine (NORVASC) 10 MG tablet Take 10 mg by mouth at bedtime.    [provider]  Calcium Carbonate-Vitamin D (CALTRATE 600+D PO) Take 1 tablet by mouth daily.    [provider]  Cholecalciferol (VITAMIN D3) 5000 units TABS Take 5,000 Units by mouth daily.  12/03/15   [provider]  fluticasone  (CUTIVATE) 0.05 % cream Apply 1 application topically daily as needed (applied to dry spots on face).    [provider]  HYDROcodone-acetaminophen (NORCO) 5-325 MG tablet Take 1-2 tablets by mouth every 6 (six) hours as needed. 04/28/17   Coralie Keens, MD  levothyroxine (SYNTHROID, LEVOTHROID) 50 MCG tablet Take 50 mcg by mouth daily before breakfast.     [provider]  Multiple Vitamin (MULTIVITAMIN WITH MINERALS) TABS tablet Take 1 tablet by mouth 2 (two) times daily.    [provider]  omeprazole (PRILOSEC) 40 MG capsule Take 40 mg by mouth at bedtime.     [provider]  protein supplement shake (PREMIER PROTEIN) LIQD Take 11 oz by mouth daily as needed (for meal replacement). Chocolate    [provider]  pseudoephedrine (SUDAFED) 30 MG tablet Take 30 mg by mouth every 6 (six) hours as needed (for allergies/congestion).     [provider]  ranitidine (ZANTAC) 150 MG tablet Take 150 mg by mouth daily.     [provider]  ursodiol (ACTIGALL) 250 MG tablet Take 250 mg by  mouth 2 (two) times daily. 03/26/17   [provider]   Social History   Social History  . Marital status: Divorced    Spouse name: N/A  . Number of children: N/A  . Years of education: N/A   Social History Main Topics  . Smoking status: Never Smoker  . Smokeless tobacco: Former Systems developer    Types: Chew     Comment: "chewed as a teen  . Alcohol use No     Comment: "quit drinking by age 42; social drinker only"  . Drug use: No  . Sexual activity: Not Currently    Birth control/ protection: None   Other Topics Concern  . Not on file   Social History Narrative  . No narrative on file   Family History  Problem Relation Age of Onset  . Transient ischemic attack Mother   . Heart attack Father   . Diabetes type II Other   . Lung cancer Maternal Grandmother     ROS: Currently denies lightheadedness, dizziness, Fever, chills, CP, SOB.    No personal history of MI, or CVA. No loose teeth or dentures Current, intermittent loose stools.  Intermittent ankle swelling L>>R.  All other systems have been reviewed and were otherwise currently negative with the exception of those mentioned in the HPI and as above.  Objective: Vitals: Ht: 5'3" Wt: 201 Temp: 98.1 BP: 154/95 Pulse: 77 O2 100% on room air. Physical Exam: General: Alert, NAD.  Antalgic Gait  HEENT: EOMI, Good Neck Extension  Pulm: No increased work of breathing.  Clear B/L A/P w/o crackle or wheeze.  CV: RRR, No m/g/r appreciated  GI: soft, NT, ND Neuro: Neuro without gross focal deficit.  Sensation intact distally Skin: No lesions in the area of chief complaint MSK/Surgical Site: Right knee w/o redness or effusion.  + JLT. ROM 0-100.  5/5 strength in extension and flexion.  +EHL/FHL.  NVI.  Stable varus and valgus stress.    Imaging Review Plain radiographs demonstrate severe degenerative joint disease of the right knee.   Assessment: OA RIGHT KNEE Principal Problem:   Primary osteoarthritis of right knee Active Problems:   Thyroid activity decreased   Left shoulder pain   GERD (gastroesophageal reflux disease)   History of DVT (deep vein thrombosis)   History of pulmonary embolus (PE)   Plan: Plan for Procedure(s): RIGHT TOTAL KNEE ARTHROPLASTY  The patient history, physical exam, clinical judgement of the provider and imaging are consistent with end stage degenerative joint disease and total joint arthroplasty is deemed medically necessary. The treatment options including medical management, injection therapy, and arthroplasty were discussed at length. The risks and benefits of Procedure(s): RIGHT TOTAL KNEE ARTHROPLASTY were presented and reviewed.  The risks of nonoperative treatment, versus surgical intervention including but not limited to continued pain, aseptic loosening, stiffness, dislocation/subluxation, infection, bleeding, nerve injury, blood  clots, cardiopulmonary complications, morbidity, mortality, among others were discussed. The patient verbalizes understanding and wishes to proceed with the plan.  Patient is being admitted for inpatient treatment for surgery, pain control, PT, OT, prophylactic antibiotics, VTE prophylaxis, progressive ambulation, ADL's and discharge planning.   Dental prophylaxis discussed and recommended for 2 years postoperatively.   The patient does not meet the criteria for TXA dt DVT/PE history.  Xarelto will be used postoperatively for DVT prophylaxis dt h/o DVT/PE and gastric bypass in addition to SCDs, and early ambulation.  The patient is planning to be discharged home with home health services ([facility]) in care of  her mother.  Her postoperative prescriptions were provided at her pre-op visit  PCN allergy as a child reported as life-treating / affected breathing  Prudencio Burly III, PA-C 06/21/2017 1:18 PM

## 2017-06-24 ENCOUNTER — Other Ambulatory Visit (HOSPITAL_COMMUNITY): Payer: Self-pay | Admitting: *Deleted

## 2017-06-24 NOTE — Pre-Procedure Instructions (Signed)
    GENICE KIMBERLIN  06/24/2017      [facility] [place], [place] - [address] [place] [address] Phone: [phone] Fax: [phone]    Your procedure is scheduled on Tuesday, July 06, 2017 at 7:30 AM.   Report to [facility] Entrance "A" Admitting Office at 5:30 AM.   Call this number if you have problems the morning of surgery: [phone]   Questions prior to day of surgery, please call [phone] between 8 & 4 PM.   Remember:  Do not eat food or drink liquids after midnight Monday, 07/05/17.  Take these medicines the morning of surgery with A SIP OF WATER: Levothyroxine (Synthroid), Ranitidine (Zantac)   Stop Sudafed as of today. Stop Multivitamins 7 days prior to surgery. Do not use Aspirin products or NSAIDS (Ibuprofen, Aleve, etc) 7 days prior to surgery.   Do not wear jewelry, make-up or nail polish.  Do not wear lotions, powders,perfumes or deodorant.  Do not shave 48 hours prior to surgery.    Do not bring valuables to the hospital.  [facility] is not responsible for any belongings or valuables.  Contacts, dentures or bridgework may not be worn into surgery.  Leave your suitcase in the car.  After surgery it may be brought to your room.  For patients admitted to the hospital, discharge time will be determined by your treatment team.  See "Preparing for Surgery" Instruction sheet.   Please read over the fact sheets that you were given.

## 2017-06-25 ENCOUNTER — Encounter (HOSPITAL_COMMUNITY)
Admission: RE | Admit: 2017-06-25 | Discharge: 2017-06-25 | Disposition: A | Discharge status: Home / Self Care | Payer: Medicare Other | Source: Ambulatory Visit | Attending: Orthopedic Surgery | Admitting: Orthopedic Surgery

## 2017-06-25 ENCOUNTER — Encounter (HOSPITAL_COMMUNITY): Payer: Self-pay

## 2017-06-25 DIAGNOSIS — M1711 Unilateral primary osteoarthritis, right knee: Secondary | ICD-10-CM | POA: Insufficient documentation

## 2017-06-25 DIAGNOSIS — Z01812 Encounter for preprocedural laboratory examination: Secondary | ICD-10-CM | POA: Insufficient documentation

## 2017-06-25 LAB — SURGICAL PCR SCREEN
MRSA, PCR: NEGATIVE
Staphylococcus aureus: NEGATIVE

## 2017-06-25 LAB — CBC
HCT: 44.4 % (ref 36.0–46.0)
Hemoglobin: 14.2 g/dL (ref 12.0–15.0)
MCH: 29.7 pg (ref 26.0–34.0)
MCHC: 32 g/dL (ref 30.0–36.0)
MCV: 92.9 fL (ref 78.0–100.0)
PLATELETS: 245 10*3/uL (ref 150–400)
RBC: 4.78 MIL/uL (ref 3.87–5.11)
RDW: 14.1 % (ref 11.5–15.5)
WBC: 6.3 10*3/uL (ref 4.0–10.5)

## 2017-06-25 LAB — BASIC METABOLIC PANEL
ANION GAP: 9 (ref 5–15)
BUN: 11 mg/dL (ref 6–20)
CALCIUM: 9.2 mg/dL (ref 8.9–10.3)
CHLORIDE: 106 mmol/L (ref 101–111)
CO2: 25 mmol/L (ref 22–32)
Creatinine, Ser: 0.71 mg/dL (ref 0.44–1.00)
GFR calc Af Amer: >60 mL/min (ref >60)
GLUCOSE: 84 mg/dL (ref 65–99)
POTASSIUM: 3.6 mmol/L (ref 3.5–5.1)
Sodium: 140 mmol/L (ref 135–145)

## 2017-06-25 NOTE — Progress Notes (Signed)
PCP is Dr. Suzanna Obey  LOV 03/2017. Bariatric MD is Dr. Volanda Napoleon. LOV 05/2017, since losing a lot of weight, decision made by herself and Dr. Volanda Napoleon, that CPAP was no longer needed. Back in Feb. 14, 2017 she was diagnosed with DVT left leg.  In hospital.  She states that clots in lungs are gone, but was told she had "multiple massive branching in calf"  She takes no blood thinners.  Hematologist is Dr. Zola Button, which she has seen for her uterine cancer.  Received no chemo or radiation. She did state that yrs (more than 10), she had stress test for an abnormal ekg and sob.  "came out fine".  Has not seen a cardio, denies any other cardiac tests, no murmur, no cp.

## 2017-06-25 NOTE — Pre-Procedure Instructions (Signed)
Erin Vasquez  06/25/2017      [facility] [place], [place] - [address] [place] [address] Phone: [phone] Fax: [phone]    Your procedure is scheduled on Tuesday, July 06, 2017 at 7:30 AM.   Report to [facility] Entrance "A" Admitting Office at 5:30 AM.   Call this number if you have problems the morning of surgery: [phone]   Questions prior to day of surgery, please call [phone] between 8 & 4 PM.   Remember:  Do not eat food or drink liquids after midnight Monday, 07/05/17.  Take these medicines the morning of surgery with A SIP OF WATER: Levothyroxine (Synthroid), Ranitidine (Zantac)   Stop Sudafed as of today. Stop Multivitamins 7 days prior to surgery. Do not use Aspirin products or NSAIDS (Ibuprofen, Aleve, etc) 7 days prior to surgery.   Do not wear jewelry, make-up or nail polish.  Do not wear lotions, powders,perfumes or deodorant.  Do not shave 48 hours prior to surgery.    Do not bring valuables to the hospital.  [facility] is not responsible for any belongings or valuables.  Contacts, dentures or bridgework may not be worn into surgery.  Leave your suitcase in the car.  After surgery it may be brought to your room.  For patients admitted to the hospital, discharge time will be determined by your treatment team. Please read over the fact sheets that you were given.     [facility]- Preparing For Surgery  Before surgery, you can play an important role. Because skin is not sterile, your skin needs to be as free of germs as possible. You can reduce the number of germs on your skin by washing with CHG (chlorahexidine gluconate) Soap before surgery.  CHG is an antiseptic cleaner which kills germs and bonds with the skin to continue killing germs even after washing.  Please do not use if you have an allergy to CHG or antibacterial soaps. If your skin becomes reddened/irritated stop  using the CHG.  Do not shave (including legs and underarms) for at least 48 hours prior to first CHG shower. It is OK to shave your face.  Please follow these instructions carefully.   1. Shower the NIGHT BEFORE SURGERY and the MORNING OF SURGERY with CHG.   2. If you chose to wash your hair, wash your hair first as usual with your normal shampoo.  3. After you shampoo, rinse your hair and body thoroughly to remove the shampoo.  4. Use CHG as you would any other liquid soap. You can apply CHG directly to the skin and wash gently with a scrungie or a clean washcloth.   5. Apply the CHG Soap to your body ONLY FROM THE NECK DOWN.  Do not use on open wounds or open sores. Avoid contact with your eyes, ears, mouth and genitals (private parts). Wash Face and genitals (private parts)  with your normal soap.  6. Wash thoroughly, paying special attention to the area where your surgery will be performed.  7. Thoroughly rinse your body with warm water from the neck down.  8. DO NOT shower/wash with your normal soap after using and rinsing off the CHG Soap.  9. Pat yourself dry with a CLEAN TOWEL.  10. Wear CLEAN PAJAMAS to bed the night before surgery, wear comfortable clothes the morning of surgery  11. Place CLEAN SHEETS on your bed the night of your first shower and  DO NOT SLEEP WITH PETS.    Day of Surgery: Do not apply any deodorants/lotions. Please wear clean clothes to the hospital/surgery center.

## 2017-07-02 ENCOUNTER — Other Ambulatory Visit: Payer: Self-pay | Admitting: Orthopedic Surgery

## 2017-07-05 MED ORDER — ACETAMINOPHEN 500 MG PO TABS
1000.0000 mg | ORAL_TABLET | Freq: Once | ORAL | Status: AC
  Administered 2017-07-06: 1000 mg via ORAL
  Filled 2017-07-05: qty 2

## 2017-07-05 MED ORDER — GABAPENTIN 300 MG PO CAPS
300.0000 mg | ORAL_CAPSULE | Freq: Once | ORAL | Status: AC
  Administered 2017-07-06: 300 mg via ORAL
  Filled 2017-07-05: qty 1

## 2017-07-05 MED ORDER — VANCOMYCIN HCL IN DEXTROSE 1-5 GM/200ML-% IV SOLN
1000.0000 mg | INTRAVENOUS | Status: AC
Start: 2017-07-06 — End: 2017-07-06
  Administered 2017-07-06: 1000 mg via INTRAVENOUS
  Filled 2017-07-05: qty 200

## 2017-07-05 MED ORDER — LACTATED RINGERS IV SOLN
INTRAVENOUS | Status: DC
  Administered 2017-07-06 (×2): via INTRAVENOUS

## 2017-07-06 ENCOUNTER — Encounter (HOSPITAL_COMMUNITY): Payer: Self-pay | Admitting: *Deleted

## 2017-07-06 ENCOUNTER — Inpatient Hospital Stay (HOSPITAL_COMMUNITY)
Admission: RE | Admit: 2017-07-06 | Discharge: 2017-07-08 | DRG: 470 | Disposition: A | Discharge status: Home Health | Payer: Medicare Other | Source: Ambulatory Visit | Attending: Orthopedic Surgery | Admitting: Orthopedic Surgery

## 2017-07-06 ENCOUNTER — Inpatient Hospital Stay (HOSPITAL_COMMUNITY): Payer: Medicare Other

## 2017-07-06 ENCOUNTER — Inpatient Hospital Stay (HOSPITAL_COMMUNITY): Payer: Medicare Other | Admitting: Vascular Surgery

## 2017-07-06 ENCOUNTER — Inpatient Hospital Stay (HOSPITAL_COMMUNITY): Payer: Medicare Other | Admitting: Anesthesiology

## 2017-07-06 ENCOUNTER — Encounter (HOSPITAL_COMMUNITY)
Admission: RE | Disposition: A | Discharge status: Home Health | Payer: Self-pay | Source: Ambulatory Visit | Attending: Orthopedic Surgery

## 2017-07-06 DIAGNOSIS — Z880 Allergy status to penicillin: Secondary | ICD-10-CM | POA: Diagnosis not present

## 2017-07-06 DIAGNOSIS — Z86718 Personal history of other venous thrombosis and embolism: Secondary | ICD-10-CM | POA: Diagnosis not present

## 2017-07-06 DIAGNOSIS — E039 Hypothyroidism, unspecified: Secondary | ICD-10-CM | POA: Diagnosis present

## 2017-07-06 DIAGNOSIS — D6862 Lupus anticoagulant syndrome: Secondary | ICD-10-CM | POA: Diagnosis present

## 2017-07-06 DIAGNOSIS — K449 Diaphragmatic hernia without obstruction or gangrene: Secondary | ICD-10-CM | POA: Diagnosis present

## 2017-07-06 DIAGNOSIS — Z91018 Allergy to other foods: Secondary | ICD-10-CM

## 2017-07-06 DIAGNOSIS — Z23 Encounter for immunization: Secondary | ICD-10-CM | POA: Diagnosis present

## 2017-07-06 DIAGNOSIS — M1711 Unilateral primary osteoarthritis, right knee: Principal | ICD-10-CM | POA: Diagnosis present

## 2017-07-06 DIAGNOSIS — I10 Essential (primary) hypertension: Secondary | ICD-10-CM | POA: Diagnosis present

## 2017-07-06 DIAGNOSIS — M549 Dorsalgia, unspecified: Secondary | ICD-10-CM | POA: Diagnosis present

## 2017-07-06 DIAGNOSIS — G709 Myoneural disorder, unspecified: Secondary | ICD-10-CM | POA: Diagnosis present

## 2017-07-06 DIAGNOSIS — K219 Gastro-esophageal reflux disease without esophagitis: Secondary | ICD-10-CM | POA: Diagnosis present

## 2017-07-06 DIAGNOSIS — Z9884 Bariatric surgery status: Secondary | ICD-10-CM

## 2017-07-06 DIAGNOSIS — Z885 Allergy status to narcotic agent status: Secondary | ICD-10-CM | POA: Diagnosis not present

## 2017-07-06 DIAGNOSIS — M25512 Pain in left shoulder: Secondary | ICD-10-CM | POA: Diagnosis present

## 2017-07-06 DIAGNOSIS — G4733 Obstructive sleep apnea (adult) (pediatric): Secondary | ICD-10-CM | POA: Diagnosis present

## 2017-07-06 DIAGNOSIS — J45909 Unspecified asthma, uncomplicated: Secondary | ICD-10-CM | POA: Diagnosis present

## 2017-07-06 DIAGNOSIS — Z86711 Personal history of pulmonary embolism: Secondary | ICD-10-CM

## 2017-07-06 DIAGNOSIS — Z87891 Personal history of nicotine dependence: Secondary | ICD-10-CM | POA: Diagnosis not present

## 2017-07-06 DIAGNOSIS — I739 Peripheral vascular disease, unspecified: Secondary | ICD-10-CM | POA: Diagnosis present

## 2017-07-06 DIAGNOSIS — Z79899 Other long term (current) drug therapy: Secondary | ICD-10-CM | POA: Diagnosis not present

## 2017-07-06 DIAGNOSIS — Z91048 Other nonmedicinal substance allergy status: Secondary | ICD-10-CM

## 2017-07-06 DIAGNOSIS — Z9102 Food additives allergy status: Secondary | ICD-10-CM

## 2017-07-06 DIAGNOSIS — Z91040 Latex allergy status: Secondary | ICD-10-CM

## 2017-07-06 DIAGNOSIS — N289 Disorder of kidney and ureter, unspecified: Secondary | ICD-10-CM | POA: Diagnosis present

## 2017-07-06 DIAGNOSIS — G8929 Other chronic pain: Secondary | ICD-10-CM | POA: Diagnosis present

## 2017-07-06 DIAGNOSIS — Z96659 Presence of unspecified artificial knee joint: Secondary | ICD-10-CM

## 2017-07-06 DIAGNOSIS — Z91030 Bee allergy status: Secondary | ICD-10-CM

## 2017-07-06 HISTORY — DX: Sacrococcygeal disorders, not elsewhere classified: M53.3

## 2017-07-06 HISTORY — PX: TOTAL KNEE ARTHROPLASTY: SHX125

## 2017-07-06 HISTORY — DX: Lupus anticoagulant syndrome: D68.62

## 2017-07-06 SURGERY — ARTHROPLASTY, KNEE, TOTAL
Anesthesia: Regional | Site: Knee | Laterality: Right

## 2017-07-06 MED ORDER — ONDANSETRON HCL 4 MG PO TABS: 4.0000 mg | ORAL_TABLET | Freq: Four times a day (QID) | ORAL | Status: DC | PRN

## 2017-07-06 MED ORDER — LIDOCAINE 2% (20 MG/ML) 5 ML SYRINGE
INTRAMUSCULAR | Status: AC
  Filled 2017-07-06: qty 15

## 2017-07-06 MED ORDER — SODIUM CHLORIDE FLUSH 0.9 % IV SOLN
INTRAVENOUS | Status: DC | PRN
  Administered 2017-07-06: 30 mL via INTRAVENOUS

## 2017-07-06 MED ORDER — ADULT MULTIVITAMIN W/MINERALS CH
1.0000 | ORAL_TABLET | Freq: Two times a day (BID) | ORAL | Status: DC
  Administered 2017-07-06 – 2017-07-08 (×4): 1 via ORAL
  Filled 2017-07-06 (×4): qty 1

## 2017-07-06 MED ORDER — METOCLOPRAMIDE HCL 5 MG/ML IJ SOLN: 5.0000 mg | Freq: Three times a day (TID) | INTRAMUSCULAR | Status: DC | PRN

## 2017-07-06 MED ORDER — FENTANYL CITRATE (PF) 250 MCG/5ML IJ SOLN
INTRAMUSCULAR | Status: DC | PRN
  Administered 2017-07-06: 50 ug via INTRAVENOUS
  Administered 2017-07-06 (×4): 25 ug via INTRAVENOUS
  Administered 2017-07-06: 50 ug via INTRAVENOUS

## 2017-07-06 MED ORDER — ONDANSETRON HCL 4 MG/2ML IJ SOLN
INTRAMUSCULAR | Status: DC | PRN
  Administered 2017-07-06: 4 mg via INTRAVENOUS

## 2017-07-06 MED ORDER — OXYCODONE HCL 5 MG PO TABS: 5.0000 mg | ORAL_TABLET | ORAL | Status: DC | PRN

## 2017-07-06 MED ORDER — METOCLOPRAMIDE HCL 5 MG PO TABS: 5.0000 mg | ORAL_TABLET | Freq: Three times a day (TID) | ORAL | Status: DC | PRN

## 2017-07-06 MED ORDER — LIDOCAINE 2% (20 MG/ML) 5 ML SYRINGE
INTRAMUSCULAR | Status: DC | PRN
  Administered 2017-07-06: 40 mg via INTRAVENOUS

## 2017-07-06 MED ORDER — ONDANSETRON HCL 4 MG/2ML IJ SOLN
INTRAMUSCULAR | Status: AC
  Filled 2017-07-06: qty 2

## 2017-07-06 MED ORDER — FENTANYL CITRATE (PF) 250 MCG/5ML IJ SOLN
INTRAMUSCULAR | Status: AC
  Filled 2017-07-06: qty 5

## 2017-07-06 MED ORDER — PROPOFOL 10 MG/ML IV BOLUS
INTRAVENOUS | Status: AC
  Filled 2017-07-06: qty 20

## 2017-07-06 MED ORDER — MIDAZOLAM HCL 2 MG/2ML IJ SOLN
INTRAMUSCULAR | Status: AC
  Filled 2017-07-06: qty 2

## 2017-07-06 MED ORDER — KETOROLAC TROMETHAMINE 30 MG/ML IJ SOLN
INTRAMUSCULAR | Status: DC | PRN
  Administered 2017-07-06: 30 mg via INTRA_ARTICULAR

## 2017-07-06 MED ORDER — OXYCODONE HCL 5 MG PO TABS: 5.0000 mg | ORAL_TABLET | Freq: Once | ORAL | Status: DC | PRN

## 2017-07-06 MED ORDER — METHOCARBAMOL 1000 MG/10ML IJ SOLN
500.0000 mg | Freq: Four times a day (QID) | INTRAVENOUS | Status: DC | PRN
  Filled 2017-07-06: qty 5

## 2017-07-06 MED ORDER — DOCUSATE SODIUM 100 MG PO CAPS
100.0000 mg | ORAL_CAPSULE | Freq: Two times a day (BID) | ORAL | Status: DC
  Administered 2017-07-06 – 2017-07-08 (×4): 100 mg via ORAL
  Filled 2017-07-06 (×4): qty 1

## 2017-07-06 MED ORDER — BUPIVACAINE IN DEXTROSE 0.75-8.25 % IT SOLN
INTRATHECAL | Status: DC | PRN
  Administered 2017-07-06: 1.8 mL via INTRATHECAL

## 2017-07-06 MED ORDER — CHLORHEXIDINE GLUCONATE 4 % EX LIQD: 60.0000 mL | Freq: Once | CUTANEOUS | Status: DC

## 2017-07-06 MED ORDER — EPHEDRINE SULFATE-NACL 50-0.9 MG/10ML-% IV SOSY
PREFILLED_SYRINGE | INTRAVENOUS | Status: DC | PRN
  Administered 2017-07-06: 5 mg via INTRAVENOUS

## 2017-07-06 MED ORDER — METHOCARBAMOL 500 MG PO TABS
ORAL_TABLET | ORAL | Status: AC
  Administered 2017-07-06: 500 mg via ORAL
  Filled 2017-07-06: qty 1

## 2017-07-06 MED ORDER — SORBITOL 70 % SOLN: 30.0000 mL | Freq: Every day | Status: DC | PRN

## 2017-07-06 MED ORDER — HYDROMORPHONE HCL 1 MG/ML IJ SOLN
INTRAMUSCULAR | Status: AC
  Administered 2017-07-06: 0.5 mg via INTRAVENOUS
  Filled 2017-07-06: qty 1

## 2017-07-06 MED ORDER — OXYCODONE HCL 5 MG PO TABS
10.0000 mg | ORAL_TABLET | ORAL | Status: DC | PRN
  Administered 2017-07-06 – 2017-07-08 (×6): 10 mg via ORAL
  Filled 2017-07-06 (×5): qty 2

## 2017-07-06 MED ORDER — PREMIER PROTEIN SHAKE
11.0000 [oz_av] | Freq: Every day | ORAL | Status: DC | PRN
  Filled 2017-07-06: qty 325.31

## 2017-07-06 MED ORDER — PANTOPRAZOLE SODIUM 40 MG PO TBEC
40.0000 mg | DELAYED_RELEASE_TABLET | Freq: Every day | ORAL | Status: DC
  Administered 2017-07-06 – 2017-07-07 (×2): 40 mg via ORAL
  Filled 2017-07-06 (×2): qty 1

## 2017-07-06 MED ORDER — FAMOTIDINE 20 MG PO TABS
20.0000 mg | ORAL_TABLET | Freq: Every day | ORAL | Status: DC
  Administered 2017-07-07 – 2017-07-08 (×2): 20 mg via ORAL
  Filled 2017-07-06 (×2): qty 1

## 2017-07-06 MED ORDER — ACETAMINOPHEN 650 MG RE SUPP: 650.0000 mg | RECTAL | Status: DC | PRN

## 2017-07-06 MED ORDER — SODIUM CHLORIDE 0.9 % IR SOLN
Status: DC | PRN
  Administered 2017-07-06: 3000 mL

## 2017-07-06 MED ORDER — METHOCARBAMOL 500 MG PO TABS
500.0000 mg | ORAL_TABLET | Freq: Four times a day (QID) | ORAL | Status: DC | PRN
  Administered 2017-07-06 – 2017-07-08 (×6): 500 mg via ORAL
  Filled 2017-07-06 (×5): qty 1

## 2017-07-06 MED ORDER — PROPOFOL 500 MG/50ML IV EMUL
INTRAVENOUS | Status: DC | PRN
  Administered 2017-07-06: 50 ug/kg/min via INTRAVENOUS

## 2017-07-06 MED ORDER — ROPIVACAINE HCL 7.5 MG/ML IJ SOLN
INTRAMUSCULAR | Status: DC | PRN
  Administered 2017-07-06: 20 mL via PERINEURAL

## 2017-07-06 MED ORDER — LACTATED RINGERS IV SOLN
INTRAVENOUS | Status: DC
  Administered 2017-07-06 – 2017-07-07 (×3): via INTRAVENOUS

## 2017-07-06 MED ORDER — RIVAROXABAN 10 MG PO TABS
10.0000 mg | ORAL_TABLET | Freq: Every day | ORAL | Status: DC
  Administered 2017-07-07 – 2017-07-08 (×2): 10 mg via ORAL
  Filled 2017-07-06 (×2): qty 1

## 2017-07-06 MED ORDER — OXYCODONE HCL 5 MG/5ML PO SOLN
5.0000 mg | Freq: Once | ORAL | Status: DC | PRN
Start: 2017-07-06 — End: 2017-07-06

## 2017-07-06 MED ORDER — HYDROMORPHONE HCL 1 MG/ML IJ SOLN
INTRAMUSCULAR | Status: AC
  Administered 2017-07-06: 0.25 mg via INTRAVENOUS
  Filled 2017-07-06: qty 1

## 2017-07-06 MED ORDER — ACETAMINOPHEN 500 MG PO TABS
1000.0000 mg | ORAL_TABLET | Freq: Three times a day (TID) | ORAL | Status: DC
  Administered 2017-07-06 – 2017-07-08 (×6): 1000 mg via ORAL
  Filled 2017-07-06 (×7): qty 2

## 2017-07-06 MED ORDER — BUPIVACAINE HCL (PF) 0.25 % IJ SOLN
INTRAMUSCULAR | Status: AC
  Filled 2017-07-06: qty 30

## 2017-07-06 MED ORDER — HYDROMORPHONE HCL 1 MG/ML IJ SOLN
0.2500 mg | INTRAMUSCULAR | Status: DC | PRN
  Administered 2017-07-06: 0.5 mg via INTRAVENOUS
  Administered 2017-07-06 (×2): 0.25 mg via INTRAVENOUS

## 2017-07-06 MED ORDER — PHENYLEPHRINE 40 MCG/ML (10ML) SYRINGE FOR IV PUSH (FOR BLOOD PRESSURE SUPPORT)
PREFILLED_SYRINGE | INTRAVENOUS | Status: DC | PRN
  Administered 2017-07-06 (×9): 80 ug via INTRAVENOUS

## 2017-07-06 MED ORDER — MIDAZOLAM HCL 2 MG/2ML IJ SOLN
INTRAMUSCULAR | Status: DC | PRN
  Administered 2017-07-06: 0.5 mg via INTRAVENOUS
  Administered 2017-07-06: 2 mg via INTRAVENOUS

## 2017-07-06 MED ORDER — DIPHENHYDRAMINE HCL 12.5 MG/5ML PO ELIX: 12.5000 mg | ORAL_SOLUTION | ORAL | Status: DC | PRN

## 2017-07-06 MED ORDER — ACETAMINOPHEN 325 MG PO TABS
650.0000 mg | ORAL_TABLET | ORAL | Status: DC | PRN
  Administered 2017-07-07: 650 mg via ORAL
  Filled 2017-07-06: qty 2

## 2017-07-06 MED ORDER — PHENYLEPHRINE 40 MCG/ML (10ML) SYRINGE FOR IV PUSH (FOR BLOOD PRESSURE SUPPORT)
PREFILLED_SYRINGE | INTRAVENOUS | Status: AC
  Filled 2017-07-06: qty 20

## 2017-07-06 MED ORDER — SENNA 8.6 MG PO TABS
1.0000 | ORAL_TABLET | Freq: Two times a day (BID) | ORAL | Status: DC
  Administered 2017-07-07 – 2017-07-08 (×3): 8.6 mg via ORAL
  Filled 2017-07-06 (×3): qty 1

## 2017-07-06 MED ORDER — PHENOL 1.4 % MT LIQD: 1.0000 | OROMUCOSAL | Status: DC | PRN

## 2017-07-06 MED ORDER — OXYCODONE HCL 5 MG PO TABS
ORAL_TABLET | ORAL | Status: AC
  Administered 2017-07-06: 10 mg via ORAL
  Filled 2017-07-06: qty 2

## 2017-07-06 MED ORDER — HYDROMORPHONE HCL 1 MG/ML IJ SOLN: 0.5000 mg | INTRAMUSCULAR | Status: DC | PRN

## 2017-07-06 MED ORDER — ONDANSETRON HCL 4 MG/2ML IJ SOLN
4.0000 mg | Freq: Four times a day (QID) | INTRAMUSCULAR | Status: DC | PRN
  Administered 2017-07-06: 4 mg via INTRAVENOUS
  Filled 2017-07-06: qty 2

## 2017-07-06 MED ORDER — 0.9 % SODIUM CHLORIDE (POUR BTL) OPTIME
TOPICAL | Status: DC | PRN
  Administered 2017-07-06: 1000 mL

## 2017-07-06 MED ORDER — URSODIOL 250 MG PO TABS
250.0000 mg | ORAL_TABLET | Freq: Two times a day (BID) | ORAL | Status: DC
  Administered 2017-07-07: 250 mg via ORAL

## 2017-07-06 MED ORDER — PROPOFOL 10 MG/ML IV BOLUS
INTRAVENOUS | Status: DC | PRN
  Administered 2017-07-06 (×9): 20 mg via INTRAVENOUS

## 2017-07-06 MED ORDER — POLYETHYLENE GLYCOL 3350 17 G PO PACK: 17.0000 g | PACK | Freq: Every day | ORAL | Status: DC | PRN

## 2017-07-06 MED ORDER — MENTHOL 3 MG MT LOZG: 1.0000 | LOZENGE | OROMUCOSAL | Status: DC | PRN

## 2017-07-06 MED ORDER — BUPIVACAINE HCL (PF) 0.25 % IJ SOLN
INTRAMUSCULAR | Status: DC | PRN
  Administered 2017-07-06: 30 mL

## 2017-07-06 MED ORDER — AMLODIPINE BESYLATE 5 MG PO TABS
5.0000 mg | ORAL_TABLET | Freq: Every day | ORAL | Status: DC
  Administered 2017-07-06 – 2017-07-07 (×2): 5 mg via ORAL
  Filled 2017-07-06 (×2): qty 1

## 2017-07-06 MED ORDER — KETOROLAC TROMETHAMINE 15 MG/ML IJ SOLN: 7.5000 mg | Freq: Three times a day (TID) | INTRAMUSCULAR | Status: DC | PRN

## 2017-07-06 MED ORDER — LEVOTHYROXINE SODIUM 50 MCG PO TABS
50.0000 ug | ORAL_TABLET | Freq: Every day | ORAL | Status: DC
  Administered 2017-07-07 – 2017-07-08 (×2): 50 ug via ORAL
  Filled 2017-07-06 (×2): qty 1

## 2017-07-06 SURGICAL SUPPLY — 57 items
BANDAGE ESMARK 6X9 LF (GAUZE/BANDAGES/DRESSINGS) ×1 IMPLANT
BLADE SAG 18X100X1.27 (BLADE) ×3 IMPLANT
BNDG COHESIVE 3X5 TAN STRL LF (GAUZE/BANDAGES/DRESSINGS) ×3 IMPLANT
BNDG COHESIVE 6X5 TAN STRL LF (GAUZE/BANDAGES/DRESSINGS) ×3 IMPLANT
BNDG ELASTIC 6X10 VLCR STRL LF (GAUZE/BANDAGES/DRESSINGS) ×3 IMPLANT
BNDG ESMARK 6X9 LF (GAUZE/BANDAGES/DRESSINGS) ×3
BOWL SMART MIX CTS (DISPOSABLE) ×3 IMPLANT
CAPT KNEE TRIATH TK-4 ×3 IMPLANT
CLOSURE STERI-STRIP 1/2X4 (GAUZE/BANDAGES/DRESSINGS) ×1
CLSR STERI-STRIP ANTIMIC 1/2X4 (GAUZE/BANDAGES/DRESSINGS) ×2 IMPLANT
COVER SURGICAL LIGHT HANDLE (MISCELLANEOUS) ×3 IMPLANT
CUFF TOURNIQUET SINGLE 34IN LL (TOURNIQUET CUFF) ×3 IMPLANT
DRAPE EXTREMITY T 121X128X90 (DRAPE) ×3 IMPLANT
DRAPE HALF SHEET 40X57 (DRAPES) ×3 IMPLANT
DRAPE U-SHAPE 47X51 STRL (DRAPES) ×3 IMPLANT
DRSG MEPILEX BORDER 4X12 (GAUZE/BANDAGES/DRESSINGS) ×3 IMPLANT
DRSG MEPILEX BORDER 4X8 (GAUZE/BANDAGES/DRESSINGS) ×3 IMPLANT
DURAPREP 26ML APPLICATOR (WOUND CARE) ×3 IMPLANT
ELECT CAUTERY BLADE 6.4 (BLADE) ×3 IMPLANT
ELECT REM PT RETURN 9FT ADLT (ELECTROSURGICAL) ×3
ELECTRODE REM PT RTRN 9FT ADLT (ELECTROSURGICAL) ×1 IMPLANT
FACESHIELD WRAPAROUND (MASK) ×9 IMPLANT
GLOVE BIO SURGEON STRL SZ7.5 (GLOVE) ×6 IMPLANT
GLOVE BIOGEL PI IND STRL 8 (GLOVE) ×2 IMPLANT
GLOVE BIOGEL PI INDICATOR 8 (GLOVE) ×4
GOWN STRL REUS W/ TWL LRG LVL3 (GOWN DISPOSABLE) ×2 IMPLANT
GOWN STRL REUS W/ TWL XL LVL3 (GOWN DISPOSABLE) ×1 IMPLANT
GOWN STRL REUS W/TWL LRG LVL3 (GOWN DISPOSABLE) ×4
GOWN STRL REUS W/TWL XL LVL3 (GOWN DISPOSABLE) ×2
HANDPIECE INTERPULSE COAX TIP (DISPOSABLE) ×2
IMMOBILIZER KNEE 22 (SOFTGOODS) ×3 IMPLANT
IMMOBILIZER KNEE 22 UNIV (SOFTGOODS) ×3 IMPLANT
KIT BASIN OR (CUSTOM PROCEDURE TRAY) ×3 IMPLANT
KIT ROOM TURNOVER OR (KITS) ×3 IMPLANT
MANIFOLD NEPTUNE II (INSTRUMENTS) ×3 IMPLANT
NEEDLE 18GX1X1/2 (RX/OR ONLY) (NEEDLE) ×3 IMPLANT
NEEDLE 22X1 1/2 (OR ONLY) (NEEDLE) ×3 IMPLANT
NS IRRIG 1000ML POUR BTL (IV SOLUTION) ×3 IMPLANT
PACK TOTAL JOINT (CUSTOM PROCEDURE TRAY) ×3 IMPLANT
PAD ARMBOARD 7.5X6 YLW CONV (MISCELLANEOUS) ×3 IMPLANT
SET HNDPC FAN SPRY TIP SCT (DISPOSABLE) ×1 IMPLANT
SUCTION FRAZIER HANDLE 10FR (MISCELLANEOUS) ×2
SUCTION TUBE FRAZIER 10FR DISP (MISCELLANEOUS) ×1 IMPLANT
SUT MNCRL AB 4-0 PS2 18 (SUTURE) ×3 IMPLANT
SUT MON AB 2-0 CT1 27 (SUTURE) ×6 IMPLANT
SUT VIC AB 0 CT1 27 (SUTURE) ×2
SUT VIC AB 0 CT1 27XBRD ANBCTR (SUTURE) ×1 IMPLANT
SUT VIC AB 1 CT1 27 (SUTURE) ×4
SUT VIC AB 1 CT1 27XBRD ANBCTR (SUTURE) ×2 IMPLANT
SUT VIC AB 2-0 CT1 27 (SUTURE) ×2
SUT VIC AB 2-0 CT1 TAPERPNT 27 (SUTURE) ×1 IMPLANT
SYR 50ML LL SCALE MARK (SYRINGE) ×3 IMPLANT
SYR 5ML LL (SYRINGE) ×3 IMPLANT
TOWEL OR 17X24 6PK STRL BLUE (TOWEL DISPOSABLE) ×3 IMPLANT
TOWEL OR 17X26 10 PK STRL BLUE (TOWEL DISPOSABLE) ×3 IMPLANT
TRAY CATH 16FR W/PLASTIC CATH (SET/KITS/TRAYS/PACK) ×3 IMPLANT
TRAY FOLEY BAG SILVER LF 14FR (CATHETERS) IMPLANT

## 2017-07-06 NOTE — Op Note (Signed)
DATE OF SURGERY:  07/06/2017 TIME: 9:12 AM  PATIENT NAME:  Erin Vasquez   AGE: 61 y.o.    PRE-OPERATIVE DIAGNOSIS:  OSTEOARTHRITIS RIGHT KNEE  POST-OPERATIVE DIAGNOSIS:  Same  PROCEDURE:  Procedure(s): RIGHT TOTAL KNEE ARTHROPLASTY   SURGEON:  Sae Handrich D, MD   ASSISTANT:  Roxan Hockey, PA-C, he was present and scrubbed throughout the case, critical for completion in a timely fashion, and for retraction, instrumentation, and closure.    OPERATIVE IMPLANTS: [employer] Triathlon Posterior Stabilized. Press fit knee  Femur size 4, Tibia size 4, Patella size 32 3-peg oval button, with a 9 mm polyethylene insert.   PREOPERATIVE INDICATIONS:  VINCENZA DAIL is a 61 y.o. year old female with end stage bone on bone degenerative arthritis of the knee who failed conservative treatment, including injections, antiinflammatories, activity modification, and assistive devices, and had significant impairment of their activities of daily living, and elected for Total Knee Arthroplasty.   The risks, benefits, and alternatives were discussed at length including but not limited to the risks of infection, bleeding, nerve injury, stiffness, blood clots, the need for revision surgery, cardiopulmonary complications, among others, and they were willing to proceed.   OPERATIVE DESCRIPTION:  The patient was brought to the operative room and placed in a supine position.  General anesthesia was administered.  IV antibiotics were given.  The lower extremity was prepped and draped in the usual sterile fashion.  Time out was performed.  The leg was elevated and exsanguinated and the tourniquet was inflated.  Anterior approach was performed.  The patella was everted and osteophytes were removed.  The anterior horn of the medial and lateral meniscus was removed.   The distal femur was opened with the drill and the intramedullary distal femoral cutting jig was utilized, set at 5 degrees resecting 8 mm  off the distal femur.  Care was taken to protect the collateral ligaments.  The distal femoral sizing jig was applied, taking care to avoid notching.  Then the 4-in-1 cutting jig was applied and the anterior and posterior femur was cut, along with the chamfer cuts.  All posterior osteophytes were removed.  The flexion gap was then measured and was symmetric with the extension gap.  Then the extramedullary tibial cutting jig was utilized making the appropriate cut using the anterior tibial crest as a reference building in appropriate posterior slope.  Care was taken during the cut to protect the medial and collateral ligaments.  The proximal tibia was removed along with the posterior horns of the menisci.  The PCL was sacrificed.    The extensor gap was measured and was approximately 55mm.    I completed the distal femoral preparation using the appropriate jig to prepare the box.  The patella was then measured, and cut with the saw.    The proximal tibia sized and prepared accordingly with the reamer and the punch, and then all components were trialed with the above sized poly insert.  The knee was found to have excellent balance and full motion.    The above named components were then impacted into place and Poly tibial piece and patella were inserted.  I was very happy with his stability and ROM  I performed a periarticular injection with marcaine and toradol  The knee was easily taken through a range of motion and the patella tracked well and the knee irrigated copiously and the parapatellar and subcutaneous tissue closed with vicryl, and monocryl with steri strips for the skin.  The incision was dressed with sterile gauze and the tourniquet released and the patient was awakened and returned to the PACU in stable and satisfactory condition.  There were no complications.  Total tourniquet time was roughly 60 minutes.   POSTOPERATIVE PLAN: post op Abx, DVT px: SCD's, TED's, Early ambulation  and chemical px

## 2017-07-06 NOTE — Anesthesia Procedure Notes (Addendum)
Anesthesia Regional Block: Adductor canal block   Pre-Anesthetic Checklist: ,, timeout performed, Correct Patient, Correct Site, Correct Laterality, Correct Procedure, Correct Position, site marked, Risks and benefits discussed,  Surgical consent,  Pre-op evaluation,  At surgeon's request and post-op pain management  Laterality: Right and Lower  Prep: chloraprep       Needles:   Needle Type: Echogenic Stimulator Needle     Needle Length: 9cm  Needle Gauge: 21   Needle insertion depth: 6 cm   Additional Needles:   Procedures:,,,, ultrasound used (permanent image in chart),,,,  Narrative:  Start time: 07/06/2017 6:55 AM End time: 07/06/2017 7:12 AM Injection made incrementally with aspirations every 5 mL.  Performed by: Personally  Anesthesiologist: Lakai Moree

## 2017-07-06 NOTE — Progress Notes (Signed)
Orthopedic Tech Progress Note Patient Details:  REESHEMAH NAZARYAN 02/18/1956 [id number]  CPM Right Knee CPM Right Knee: On Right Knee Flexion (Degrees): 90 Right Knee Extension (Degrees): 0   Daeshaun Specht 07/06/2017, 10:28 AM Trapeze bar patient helper not applied because pt's weight exceeds durability of frame RN notified

## 2017-07-06 NOTE — Interval H&P Note (Signed)
History and Physical Interval Note:  07/06/2017 7:35 AM  Erin Vasquez  has presented today for surgery, with the diagnosis of OA RIGHT KNEE  The various methods of treatment have been discussed with the patient and family. After consideration of risks, benefits and other options for treatment, the patient has consented to  Procedure(s): RIGHT TOTAL KNEE ARTHROPLASTY (Right) as a surgical intervention .  The patient's history has been reviewed, patient examined, no change in status, stable for surgery.  I have reviewed the patient's chart and labs.  Questions were answered to the patient's satisfaction.     MURPHY, TIMOTHY D

## 2017-07-06 NOTE — Transfer of Care (Signed)
Immediate Anesthesia Transfer of Care Note  Patient: Erin Vasquez  Procedure(s) Performed: RIGHT TOTAL KNEE ARTHROPLASTY (Right Knee)  Patient Location: PACU  Anesthesia Type:Spinal  Level of Consciousness: awake and alert   Airway & Oxygen Therapy: Patient Spontanous Breathing and Patient connected to nasal cannula oxygen  Post-op Assessment: Report given to RN and Post -op Vital signs reviewed and stable  Post vital signs: Reviewed and stable  Last Vitals:  Vitals:   07/06/17 0731 07/06/17 0732  BP:    Pulse: 69 65  Resp:    Temp:    SpO2: 97% 98%    Last Pain:  Vitals:   07/06/17 0602  TempSrc:   PainSc: 3       Patients Stated Pain Goal: 6 (44/96/75 [id number])  Complications: No apparent anesthesia complications

## 2017-07-06 NOTE — Evaluation (Signed)
Physical Therapy Evaluation Patient Details Name: Erin Vasquez MRN: [id number] DOB: 06/27/1956 Today's Date: 07/06/2017   History of Present Illness  Pt is a 61 y.o. female s/p elective R TKA on 07/06/17. Pertinent PMH includes HTN, DVT, bilateral PE, GERD, lupus, arthritis, anemia.    Clinical Impression  Pt presents with an overall decrease in functional mobility secondary to above. PTA, pt indep and lives at home with mother. Educ on precautions, positioning, therex, and importance of mobility. Today, pt was able to transfer and amb to chair with RW and min guard for balance; further mobility limited by increased nausea and fatigue. Pt falling asleep upon sitting in chair at end of session with SpO2 down to 81% on RA; increased to 96% on 2L O2 [place] and deep breathing technique (RN aware). Pt would benefit from continued acute PT services to maximize functional mobility and independence prior to d/c with HHPT.     Follow Up Recommendations DC plan and follow up therapy as arranged by surgeon;Home health PT    Equipment Recommendations  Rolling walker with 5" wheels;3in1 (PT)    Recommendations for Other Services OT consult     Precautions / Restrictions Precautions Precautions: Knee Precaution Booklet Issued: No Precaution Comments: Verbally reviewed precautions Restrictions Weight Bearing Restrictions: Yes RLE Weight Bearing: Weight bearing as tolerated      Mobility  Bed Mobility Overal bed mobility: Needs Assistance Bed Mobility: Supine to Sit     Supine to sit: Min guard     General bed mobility comments: Increased time and effort, but no physical assist required. Increased nausea and pallor in sitting  Transfers Overall transfer level: Needs assistance Equipment used: Rolling walker (2 wheeled) Transfers: Sit to/from Stand Sit to Stand: Min guard         General transfer comment: Cues for hand placement on RW; min guard for balance secondary to c/o  nausea  Ambulation/Gait Ambulation/Gait assistance: Min guard Ambulation Distance (Feet): 5 Feet Assistive device: Rolling walker (2 wheeled) Gait Pattern/deviations: Step-to pattern;Decreased weight shift to right;Antalgic Gait velocity: Decreased Gait velocity interpretation: <1.8 ft/sec, indicative of risk for recurrent falls General Gait Details: Slow, antalgic gait with RW and min guard from bed to chair. Further mobility limited by c/o nausea. Increased sweating and pallor noted in sitting  Stairs            Wheelchair Mobility    Modified Rankin (Stroke Patients Only)       Balance Overall balance assessment: Needs assistance Sitting-balance support: No upper extremity supported;Feet supported Sitting balance-Leahy Scale: Good     Standing balance support: Bilateral upper extremity supported Standing balance-Leahy Scale: Poor Standing balance comment: Reliant on BUE support                             Pertinent Vitals/Pain Pain Assessment: No/denies pain    Home Living Family/patient expects to be discharged to:: Private residence Living Arrangements: Parent Available Help at Discharge: Family;Available 24 hours/day Type of Home: House Home Access: Stairs to enter Entrance Stairs-Rails: Psychiatric nurse of Steps: 1 Home Layout: Multi-level;Able to live on main level with bedroom/bathroom Home Equipment: None      Prior Function Level of Independence: Independent               Hand Dominance        Extremity/Trunk Assessment   Upper Extremity Assessment Upper Extremity Assessment: Overall WFL for tasks assessed  Lower Extremity Assessment Lower Extremity Assessment: RLE deficits/detail RLE Deficits / Details: s/p R TKA; hip flexion grossly 4/5, knee flex/ext 3/5       Communication   Communication: No difficulties  Cognition Arousal/Alertness: Awake/alert Behavior During Therapy: WFL for tasks  assessed/performed Overall Cognitive Status: Within Functional Limits for tasks assessed                                        General Comments General comments (skin integrity, edema, etc.): Pt falling asleep upon sitting in chair at end of session with SpO2 down to 81% on RA; increased to 96% on 2L O2 [place] and deep breathing technique    Exercises Total Joint Exercises Ankle Circles/Pumps: AROM;Both;10 reps;Supine Quad Sets: AROM;Both;10 reps;Supine Heel Slides: AROM;Right;5 reps;Supine Hip ABduction/ADduction: AROM;Right;10 reps;Supine Straight Leg Raises: AROM;Right;10 reps;Seated   Assessment/Plan    PT Assessment Patient needs continued PT services  PT Problem List Decreased range of motion;Decreased strength;Decreased activity tolerance;Decreased balance;Decreased mobility;Decreased knowledge of use of DME;Decreased knowledge of precautions;Pain       PT Treatment Interventions Gait training;DME instruction;Stair training;Functional mobility training;Therapeutic activities;Therapeutic exercise;Balance training;Patient/family education    PT Goals (Current goals can be found in the Care Plan section)  Acute Rehab PT Goals Patient Stated Goal: Return home PT Goal Formulation: With patient Time For Goal Achievement: 07/20/17 Potential to Achieve Goals: Good    Frequency 7X/week   Barriers to discharge        Co-evaluation               AM-PAC PT "6 Clicks" Daily Activity  Outcome Measure Difficulty turning over in bed (including adjusting bedclothes, sheets and blankets)?: A Little Difficulty moving from lying on back to sitting on the side of the bed? : A Little Difficulty sitting down on and standing up from a chair with arms (e.g., wheelchair, bedside commode, etc,.)?: A Little Help needed moving to and from a bed to chair (including a wheelchair)?: A Little Help needed walking in hospital room?: A Little Help needed climbing 3-5 steps with a  railing? : A Little 6 Click Score: 18    End of Session Equipment Utilized During Treatment: Gait belt Activity Tolerance: Other (comment);Patient tolerated treatment well (Patient limited by nausea) Patient left: in chair;with call bell/phone within reach;with family/visitor present Nurse Communication: Mobility status PT Visit Diagnosis: Other abnormalities of gait and mobility (R26.89);Pain Pain - Right/Left: Right Pain - part of body: Knee    Time: 4665-9935 PT Time Calculation (min) (ACUTE ONLY): 36 min   Charges:   PT Evaluation $PT Eval Moderate Complexity: 1 Mod PT Treatments $Therapeutic Exercise: 8-22 mins   PT G Codes:       Mabeline Caras, PT, DPT Acute Rehab Services  Pager: [place] 07/06/2017, 4:07 PM

## 2017-07-06 NOTE — Anesthesia Preprocedure Evaluation (Addendum)
Anesthesia Evaluation  Patient identified by MRN, date of birth, ID band Patient awake    Reviewed: Allergy & Precautions, NPO status , Patient's Chart, lab work & pertinent test results  Airway Mallampati: I  TM Distance: >3 FB Neck ROM: Full    Dental no notable dental hx. (+) Chipped, Dental Advisory Given   Pulmonary asthma , sleep apnea ,  Hx of PE   breath sounds clear to auscultation       Cardiovascular hypertension, + Peripheral Vascular Disease   Rhythm:Regular Rate:Normal     Neuro/Psych    GI/Hepatic hiatal hernia, GERD  ,  Endo/Other    Renal/GU Renal InsufficiencyRenal disease     Musculoskeletal  (+) Arthritis ,   Abdominal   Peds  Hematology   Anesthesia Other Findings   Reproductive/Obstetrics                           Anesthesia Physical Anesthesia Plan  ASA: III  Anesthesia Plan: Spinal and Regional   Post-op Pain Management:    Induction:   PONV Risk Score and Plan: 3 and Ondansetron, Dexamethasone, Midazolam, Propofol infusion and Treatment may vary due to age or medical condition  Airway Management Planned: Natural Airway and Simple Face Mask  Additional Equipment:   Intra-op Plan:   Post-operative Plan:   Informed Consent: I have reviewed the patients History and Physical, chart, labs and discussed the procedure including the risks, benefits and alternatives for the proposed anesthesia with the patient or authorized representative who has indicated his/her understanding and acceptance.     Plan Discussed with:   Anesthesia Plan Comments:        Anesthesia Quick Evaluation

## 2017-07-06 NOTE — Anesthesia Procedure Notes (Signed)
Spinal  Patient location during procedure: OR Start time: 07/06/2017 7:45 AM End time: 07/06/2017 7:55 AM Staffing Anesthesiologist: Rica Koyanagi Performed: anesthesiologist  Preanesthetic Checklist Completed: patient identified, site marked, surgical consent, pre-op evaluation, timeout performed, IV checked, risks and benefits discussed and monitors and equipment checked Spinal Block Patient position: sitting Prep: Betadine Patient monitoring: heart rate, cardiac monitor, continuous pulse ox and blood pressure Approach: right paramedian Location: L3-4 Injection technique: single-shot Needle Needle type: Quincke  Needle gauge: 25 G Needle length: 9 cm Needle insertion depth: 6 cm Assessment Sensory level: T6

## 2017-07-06 NOTE — Anesthesia Procedure Notes (Signed)
Date/Time: 07/06/2017 7:03 AM Performed by: Cynda Familia Pre-anesthesia Checklist: Patient identified, Emergency Drugs available, Suction available, Patient being monitored and Timeout performed Oxygen Delivery Method: Nasal cannula Placement Confirmation: positive ETCO2 and breath sounds checked- equal and bilateral Comments: [place] for sedation--- spinal

## 2017-07-06 NOTE — Anesthesia Postprocedure Evaluation (Signed)
Anesthesia Post Note  Patient: Erin Vasquez  Procedure(s) Performed: RIGHT TOTAL KNEE ARTHROPLASTY (Right Knee)     Patient location during evaluation: PACU Anesthesia Type: Regional and Spinal Level of consciousness: oriented and awake and alert Pain management: pain level controlled Vital Signs Assessment: post-procedure vital signs reviewed and stable Respiratory status: spontaneous breathing, respiratory function stable and patient connected to nasal cannula oxygen Cardiovascular status: blood pressure returned to baseline and stable Postop Assessment: no headache, no backache and no apparent nausea or vomiting Anesthetic complications: no    Last Vitals:  Vitals:   07/06/17 1115 07/06/17 1130  BP: 112/69 104/69  Pulse: 61 66  Resp: 10 10  Temp:    SpO2: 96% 97%    Last Pain:  Vitals:   07/06/17 1115  TempSrc:   PainSc: Asleep                 Kaprice Kage,JAMES TERRILL

## 2017-07-07 ENCOUNTER — Encounter (HOSPITAL_COMMUNITY): Payer: Self-pay | Admitting: General Practice

## 2017-07-07 LAB — CBC
HCT: 31.5 % — ABNORMAL LOW (ref 36.0–46.0)
Hemoglobin: 10.8 g/dL — ABNORMAL LOW (ref 12.0–15.0)
MCH: 31.5 pg (ref 26.0–34.0)
MCHC: 34.3 g/dL (ref 30.0–36.0)
MCV: 91.8 fL (ref 78.0–100.0)
PLATELETS: 167 10*3/uL (ref 150–400)
RBC: 3.43 MIL/uL — ABNORMAL LOW (ref 3.87–5.11)
RDW: 14 % (ref 11.5–15.5)
WBC: 8.1 10*3/uL (ref 4.0–10.5)

## 2017-07-07 MED ORDER — INFLUENZA VAC SPLIT QUAD 0.5 ML IM SUSY
0.5000 mL | PREFILLED_SYRINGE | INTRAMUSCULAR | Status: AC
  Administered 2017-07-08: 0.5 mL via INTRAMUSCULAR
  Filled 2017-07-07: qty 0.5

## 2017-07-07 MED ORDER — LACTATED RINGERS IV BOLUS (SEPSIS)
500.0000 mL | Freq: Once | INTRAVENOUS | Status: AC
  Administered 2017-07-07: 500 mL via INTRAVENOUS

## 2017-07-07 NOTE — Discharge Instructions (Signed)
INSTRUCTIONS AFTER JOINT REPLACEMENT  ° °o Remove items at home which could result in a fall. This includes throw rugs or furniture in walking pathways °o ICE to the affected joint every three hours while awake for 30 minutes at a time, for at least the first 3-5 days, and then as needed for pain and swelling.  Continue to use ice for pain and swelling. You may notice swelling that will progress down to the foot and ankle.  This is normal after surgery.  Elevate your leg when you are not up walking on it.   °o Continue to use the breathing machine you got in the hospital (incentive spirometer) which will help keep your temperature down.  It is common for your temperature to cycle up and down following surgery, especially at night when you are not up moving around and exerting yourself.  The breathing machine keeps your lungs expanded and your temperature down. ° ° °DIET:  As you were doing prior to hospitalization, we recommend a well-balanced diet. ° °DRESSING / WOUND CARE / SHOWERING ° °Keep the surgical dressing until follow up. IF THE DRESSING FALLS OFF or the wound gets wet inside, change the dressing with sterile gauze.  Please use good hand washing techniques before changing the dressing.  Do not use any lotions or creams on the incision until instructed by your surgeon.   ° °ACTIVITY ° °o Increase activity slowly as tolerated, but follow the weight bearing instructions below.   °o No driving for 6 weeks or until further direction given by your physician.  You cannot drive while taking narcotics.  °o No lifting or carrying greater than 10 lbs. until further directed by your surgeon. °o Avoid periods of inactivity such as sitting longer than an hour when not asleep. This helps prevent blood clots.  °o You may return to work once you are authorized by your doctor.  ° ° ° °WEIGHT BEARING  ° °Weight bearing as tolerated with assist device (walker, cane, etc) as directed, use it as long as suggested by your  surgeon or therapist, typically at least 4-6 weeks. ° ° °EXERCISES ° °Results after joint replacement surgery are often greatly improved when you follow the exercise, range of motion and muscle strengthening exercises prescribed by your doctor. Safety measures are also important to protect the joint from further injury. Any time any of these exercises cause you to have increased pain or swelling, decrease what you are doing until you are comfortable again and then slowly increase them. If you have problems or questions, call your caregiver or physical therapist for advice.  ° °Rehabilitation is important following a joint replacement. After just a few days of immobilization, the muscles of the leg can become weakened and shrink (atrophy).  These exercises are designed to build up the tone and strength of the thigh and leg muscles and to improve motion. Often times heat used for twenty to thirty minutes before working out will loosen up your tissues and help with improving the range of motion but do not use heat for the first two weeks following surgery (sometimes heat can increase post-operative swelling).  ° °These exercises can be done on a training (exercise) mat, on the floor, on a table or on a bed. Use whatever works the best and is most comfortable for you.    Use music or television while you are exercising so that the exercises are a pleasant break in your day. This will make your life better   with the exercises acting as a break in your routine that you can look forward to.   Perform all exercises about fifteen times, three times per day or as directed.  You should exercise both the operative leg and the other leg as well. ° °Exercises include: °  °• Quad Sets - Tighten up the muscle on the front of the thigh (Quad) and hold for 5-10 seconds.   °• Straight Leg Raises - With your knee straight (if you were given a brace, keep it on), lift the leg to 60 degrees, hold for 3 seconds, and slowly lower the leg.   Perform this exercise against resistance later as your leg gets stronger.  °• Leg Slides: Lying on your back, slowly slide your foot toward your buttocks, bending your knee up off the floor (only go as far as is comfortable). Then slowly slide your foot back down until your leg is flat on the floor again.  °• Angel Wings: Lying on your back spread your legs to the side as far apart as you can without causing discomfort.  °• Hamstring Strength:  Lying on your back, push your heel against the floor with your leg straight by tightening up the muscles of your buttocks.  Repeat, but this time bend your knee to a comfortable angle, and push your heel against the floor.  You may put a pillow under the heel to make it more comfortable if necessary.  ° °A rehabilitation program following joint replacement surgery can speed recovery and prevent re-injury in the future due to weakened muscles. Contact your doctor or a physical therapist for more information on knee rehabilitation.  ° ° °CONSTIPATION ° °Constipation is defined medically as fewer than three stools per week and severe constipation as less than one stool per week.  Even if you have a regular bowel pattern at home, your normal regimen is likely to be disrupted due to multiple reasons following surgery.  Combination of anesthesia, postoperative narcotics, change in appetite and fluid intake all can affect your bowels.  ° °YOU MUST use at least one of the following options; they are listed in order of increasing strength to get the job done.  They are all available over the counter, and you may need to use some, POSSIBLY even all of these options:   ° °Drink plenty of fluids (prune juice may be helpful) and high fiber foods °Colace 100 mg by mouth twice a day  °Senokot for constipation as directed and as needed Dulcolax (bisacodyl), take with full glass of water  °Miralax (polyethylene glycol) once or twice a day as needed. ° °If you have tried all these things and  are unable to have a bowel movement in the first 3-4 days after surgery call either your surgeon or your primary doctor.   ° °If you experience loose stools or diarrhea, hold the medications until you stool forms back up.  If your symptoms do not get better within 1 week or if they get worse, check with your doctor.  If you experience "the worst abdominal pain ever" or develop nausea or vomiting, please contact the office immediately for further recommendations for treatment. ° ° °ITCHING:  If you experience itching with your medications, try taking only a single pain pill, or even half a pain pill at a time.  You can also use Benadryl over the counter for itching or also to help with sleep.  ° °TED HOSE STOCKINGS:  Use stockings on both legs until   for at least 2 weeks or as directed by physician office. They may be removed at night for sleeping. ° °MEDICATIONS:  See your medication summary on the “After Visit Summary” that nursing will review with you.  You may have some home medications which will be placed on hold until you complete the course of blood thinner medication.  It is important for you to complete the blood thinner medication as prescribed. ° °PRECAUTIONS:  If you experience chest pain or shortness of breath - call 911 immediately for transfer to the hospital emergency department.  ° °If you develop a fever greater that 101 F, purulent drainage from wound, increased redness or drainage from wound, foul odor from the wound/dressing, or calf pain - CONTACT YOUR SURGEON.   °                                                °FOLLOW-UP APPOINTMENTS:  If you do not already have a post-op appointment, please call the office for an appointment to be seen by your surgeon.  Guidelines for how soon to be seen are listed in your “After Visit Summary”, but are typically between 1-4 weeks after surgery. ° °OTHER INSTRUCTIONS:  ° °Knee Replacement:  Do not place pillow under knee, focus on keeping the knee straight  while resting. CPM instructions: 0-90 degrees, 2 hours in the morning, 2 hours in the afternoon, and 2 hours in the evening. Place foam block, curve side up under heel at all times except when in CPM or when walking.  DO NOT modify, tear, cut, or change the foam block in any way. ° °MAKE SURE YOU:  °• Understand these instructions.  °• Get help right away if you are not doing well or get worse.  ° ° °Thank you for letting us be a part of your medical care team.  It is a privilege we respect greatly.  We hope these instructions will help you stay on track for a fast and full recovery!  ° ° ° ° °Information on my medicine - XARELTO® (Rivaroxaban) ° °This medication education was reviewed with me or my healthcare representative as part of my discharge preparation.  The pharmacist that spoke with me during my hospital stay was:  Gabriela Giannelli Dien, RPH ° °Why was Xarelto® prescribed for you? °Xarelto® was prescribed for you to reduce the risk of blood clots forming after orthopedic surgery. The medical term for these abnormal blood clots is venous thromboembolism (VTE). ° °What do you need to know about xarelto® ? °Take your Xarelto® ONCE DAILY at the same time every day. °You may take it either with or without food. ° °If you have difficulty swallowing the tablet whole, you may crush it and mix in applesauce just prior to taking your dose. ° °Take Xarelto® exactly as prescribed by your doctor and DO NOT stop taking Xarelto® without talking to the doctor who prescribed the medication.  Stopping without other VTE prevention medication to take the place of Xarelto® may increase your risk of developing a clot. ° °After discharge, you should have regular check-up appointments with your healthcare provider that is prescribing your Xarelto®.   ° °What do you do if you miss a dose? °If you miss a dose, take it as soon as you remember on the same day then continue your regularly scheduled once daily   regimen the next day. Do not  take two doses of Xarelto® on the same day.  ° °Important Safety Information °A possible side effect of Xarelto® is bleeding. You should call your healthcare provider right away if you experience any of the following: °? Bleeding from an injury or your nose that does not stop. °? Unusual colored urine (red or dark brown) or unusual colored stools (red or black). °? Unusual bruising for unknown reasons. °? A serious fall or if you hit your head (even if there is no bleeding). ° °Some medicines may interact with Xarelto® and might increase your risk of bleeding while on Xarelto®. To help avoid this, consult your healthcare provider or pharmacist prior to using any new prescription or non-prescription medications, including herbals, vitamins, non-steroidal anti-inflammatory drugs (NSAIDs) and supplements. ° °This website has more information on Xarelto®: www.xarelto.com. ° ° ° ° °

## 2017-07-07 NOTE — Care Management Note (Signed)
Case Management Note  Patient Details  Name: Erin Vasquez MRN: [id number] Date of Birth: [birth year]  Subjective/Objective:  61 yr old female s/p right total knee arthroplasty.                  Action/Plan: Case manager spoke with patient concerning discharge plan and DME.  Patient was preopertively setup with [facility], no changes. CPM has beend elivered to her home, CM has ordered RW and 3in1, will be delivered to patient's room. She will have assistance from her mom at discharge.     Expected Discharge Date:   07/08/17               Expected Discharge Plan:  [place]  In-House Referral:  NA  Discharge planning Services  CM Consult  Post Acute Care Choice:  Durable Medical Equipment, Home Health Choice offered to:  Patient  DME Arranged:  3-N-1, Walker rolling, CPM DME Agency:  [place] [employer]  HH Arranged:  PT HH Agency:  [place]  Status of Service:  Completed, signed off  If discussed at [place] of Stay Meetings, dates discussed:    Additional Comments:  Ninfa Meeker, RN 07/07/2017, 11:13 AM

## 2017-07-07 NOTE — Progress Notes (Addendum)
   Assessment / Plan: 1 Day Post-Op  S/P Procedure(s) (LRB): RIGHT TOTAL KNEE ARTHROPLASTY (Right) by Dr. Ernesta Amble. [name] on 07/06/17  Principal Problem:   Primary osteoarthritis of right knee Active Problems:   Thyroid activity decreased   Left shoulder pain   GERD (gastroesophageal reflux disease)   History of DVT (deep vein thrombosis)   History of pulmonary embolus (PE)  S/P Right Total Knee Arthroplasty: Early mobilization in room with some nausea/vomiting yesterday.  Improved, but not yet OOB today. Hypotension early this A.M. Improved.  No lightheadedness / dizziness.   Pain well controlled Tolerating diet and urinating   Up with therapy  Follow BP with therapy today.  Check CBC. Incentive Spirometry Elevate and apply ice CPM / bone foam.  Weight Bearing: Weight Bearing as Tolerated (WBAT)  Dressings: Maintain Mepilex.  VTE prophylaxis: Xarelto, SCDs, ambulation Dispo: Home w/ Home Heath Therapy likely today or tomorrow depending on therapy progress today. The patient already has her postoperative prescriptions.  Subjective: Patient reports pain as moderate.  Tolerating diet.  Urinating.  No CP, SOB.  OOB in room yesterday with some N/V - improved today.  Objective:   VITALS:   Vitals:   07/06/17 2005 07/07/17 0055 07/07/17 0519 07/07/17 0650  BP: 130/75 102/60 (!) 91/47 (!) 113/55  Pulse: 73 77 75 70  Resp: 16 14 16    Temp: 98 F (36.7 C) 97.9 F (36.6 C) 98.8 F (37.1 C)   TempSrc: Oral Oral Oral   SpO2: 98% 90% 90%    CBC Latest Ref Rng & Units 06/25/2017 04/22/2017 03/12/2016  WBC 4.0 - 10.5 K/uL 6.3 5.3 6.9  Hemoglobin 12.0 - 15.0 g/dL 14.2 13.2 12.3  Hematocrit 36.0 - 46.0 % 44.4 41.2 40.1  Platelets 150 - 400 K/uL 245 275 317   BMP Latest Ref Rng & Units 06/25/2017 04/22/2017 03/12/2016  Glucose 65 - 99 mg/dL 84 90 82  BUN 6 - 20 mg/dL 11 7 22(H)  Creatinine 0.44 - 1.00 mg/dL 0.71 0.75 1.12(H)  Sodium 135 - 145 mmol/L 140 143 139  Potassium  3.5 - 5.1 mmol/L 3.6 3.4(L) 4.4  Chloride 101 - 111 mmol/L 106 107 106  CO2 22 - 32 mmol/L 25 28 27   Calcium 8.9 - 10.3 mg/dL 9.2 9.3 9.6   Intake/Output      10/23 0701 - 10/24 0700 10/24 0701 - 10/25 0700   P.O. 480    I.V. 800    Total Intake 1280     Urine 650    Blood 50    Total Output 700     Net +580          Urine Occurrence 2 x      Physical Exam: General: NAD.  Supine in bed.  Calm, conversant. Resp: No increased wob Cardio: regular rate and rhythm ABD soft Neurologically intact MSK RLE: Neurovascularly intact Sensation intact distally Intact pulses distally Dorsiflexion/Plantar flexion intact Incision: dressing C/D/I   Prudencio Burly III, PA-C 07/07/2017, 7:40 AM

## 2017-07-07 NOTE — Progress Notes (Signed)
Physical Therapy Treatment Patient Details Name: Erin Vasquez MRN: [id number] DOB: 1956/06/18 Today's Date: 07/07/2017    History of Present Illness Pt is a 61 y.o. female s/p elective R TKA on 07/06/17. Pertinent PMH includes HTN, DVT, bilateral PE, GERD, lupus, arthritis, anemia.   PT Comments    Pt feeling much better this session; BP stable and pt asymptomatic throughout treatment. Improved ambulation distance with RW and min guard for balance; cues for technique to maximize gait mechanics. Pt requesting to return to bed, but encouraged to perform therex again this evening. Pt remains very motivated to participate with therapies. Will plan for stair training tomorrow morning.   Follow Up Recommendations  DC plan and follow up therapy as arranged by surgeon;Home health PT     Equipment Recommendations  Rolling walker with 5" wheels;3in1 (PT)    Recommendations for Other Services       Precautions / Restrictions Precautions Precautions: Knee Precaution Booklet Issued: No Precaution Comments: Watch BP Restrictions Weight Bearing Restrictions: Yes RLE Weight Bearing: Weight bearing as tolerated    Mobility  Bed Mobility Overal bed mobility: Needs Assistance Bed Mobility: Supine to Sit;Sit to Supine     Supine to sit: Supervision Sit to supine: Supervision   General bed mobility comments: Pt able to perform bed mobility without use of bed rails  Transfers Overall transfer level: Needs assistance Equipment used: Rolling walker (2 wheeled) Transfers: Sit to/from Stand Sit to Stand: Min guard            Ambulation/Gait Ambulation/Gait assistance: Min guard Ambulation Distance (Feet): 140 Feet Assistive device: Rolling walker (2 wheeled) Gait Pattern/deviations: Step-to pattern;Step-through pattern;Decreased weight shift to right;Decreased step length - left;Antalgic Gait velocity: Decreased Gait velocity interpretation: <1.8 ft/sec, indicative of risk for  recurrent falls General Gait Details: Slow, antalgic gait with RW and min guard for balance. Pt able to progress to step-through gait pattern and lengthen RLE step with cues for technique. Intermittent standing rest breaks secondary to BUE fatigue and RLE pain   Stairs            Wheelchair Mobility    Modified Rankin (Stroke Patients Only)       Balance Overall balance assessment: Needs assistance Sitting-balance support: No upper extremity supported;Feet supported Sitting balance-Leahy Scale: Good     Standing balance support: Bilateral upper extremity supported;No upper extremity supported Standing balance-Leahy Scale: Fair Standing balance comment: Able to static stand with no UE support                            Cognition Arousal/Alertness: Awake/alert Behavior During Therapy: WFL for tasks assessed/performed Overall Cognitive Status: Within Functional Limits for tasks assessed                                        Exercises      General Comments        Pertinent Vitals/Pain Pain Assessment: 0-10 Pain Score: 6  Pain Location: R knee Pain Descriptors / Indicators: Sore;Discomfort;Aching Pain Intervention(s): Monitored during session;Ice applied    Home Living Family/patient expects to be discharged to:: Private residence Living Arrangements: Parent Available Help at Discharge: Family;Available 24 hours/day Type of Home: House Home Access: Stairs to enter Entrance Stairs-Rails: Right;Left Home Layout: Multi-level;Able to live on main level with bedroom/bathroom Home Equipment: None  Prior Function Level of Independence: Independent      Comments: mom lives with her   PT Goals (current goals can now be found in the care plan section) Acute Rehab PT Goals Patient Stated Goal: Return home PT Goal Formulation: With patient Time For Goal Achievement: 07/20/17 Potential to Achieve Goals: Good Progress towards PT  goals: Progressing toward goals    Frequency    7X/week      PT Plan Current plan remains appropriate    Co-evaluation              AM-PAC PT "6 Clicks" Daily Activity  Outcome Measure  Difficulty turning over in bed (including adjusting bedclothes, sheets and blankets)?: None Difficulty moving from lying on back to sitting on the side of the bed? : None Difficulty sitting down on and standing up from a chair with arms (e.g., wheelchair, bedside commode, etc,.)?: A Little Help needed moving to and from a bed to chair (including a wheelchair)?: A Little Help needed walking in hospital room?: A Little Help needed climbing 3-5 steps with a railing? : A Little 6 Click Score: 20    End of Session Equipment Utilized During Treatment: Gait belt Activity Tolerance: Patient tolerated treatment well Patient left: in bed;with call bell/phone within reach   PT Visit Diagnosis: Other abnormalities of gait and mobility (R26.89);Pain Pain - Right/Left: Right Pain - part of body: Knee     Time: 1703-1730 PT Time Calculation (min) (ACUTE ONLY): 27 min  Charges:  $Gait Training: 23-37 mins                    G Codes:      Mabeline Caras, PT, DPT Acute Rehab Services  Pager: [place] 07/07/2017, 5:42 PM

## 2017-07-07 NOTE — Discharge Summary (Signed)
Discharge Summary  Patient ID: Erin Vasquez MRN: [id number] DOB/AGE: 1956-06-04 61 y.o.  Admit date: 07/06/2017 Discharge date: 07/08/2017  Admission Diagnoses:  Primary osteoarthritis of right knee  Discharge Diagnoses:  Principal Problem:   Primary osteoarthritis of right knee Active Problems:   Thyroid activity decreased   Left shoulder pain   GERD (gastroesophageal reflux disease)   History of DVT (deep vein thrombosis)   History of pulmonary embolus (PE)   Past Medical History:  Diagnosis Date  . Anemia    during pregnancy  . Arthritis    "all over" (07/07/2017)  . Bilateral pulmonary embolism ([place]) 10/29/2015   "from my left ankle"  . Childhood asthma    as child, nothing as adult  . Chronic back pain    "all over" (07/07/2017)  . DVT (deep venous thrombosis) ([place]) 10/29/2015   "from my left ankle"  . GERD (gastroesophageal reflux disease)    tx. Tums  . H/O hiatal hernia    "repaired w/gastric bypass OR" (07/07/2017)  . History of esophageal dilatation   . Hypertension   . Hypothyroidism   . Lupus anticoagulant disorder ([facility])    Dr. Alen Blew   . Neuromuscular disorder ([place])    L thigh - Numbness, nerve damage    . Sacroiliac pain    "left thigh has been numb since 01/2016 after appendectomy" (07/07/2017)  . Sleep apnea    07/07/2017 "bariatric dr says I don't need CPAP anymore" (07/07/2017)  . Thyroid disease   . Uterine cancer ([place]) 01/2014  . Varicose veins   . Ventral hernia   . Vitamin D deficiency     Surgeries: Procedure(s): RIGHT TOTAL KNEE ARTHROPLASTY on 07/06/2017   Consultants (if any):   Discharged Condition: Improved  Hospital Course: Erin Vasquez is an 61 y.o. female who was admitted 07/06/2017 with a diagnosis of Primary osteoarthritis of right knee and went to the operating room on 07/06/2017 and underwent the above named procedures.  She had some symptomatic hypotension early on postoperatively which resolved quickly with  IVFs.  She mobilized well with therapy.  On POD 2 she continued to feel and mobilize well and desired discharge to her home with Home health therapy in care of her mother.    She was given perioperative antibiotics:  Anti-infectives    Start     Dose/Rate Route Frequency Ordered Stop   07/06/17 0600  vancomycin (VANCOCIN) IVPB 1000 mg/200 mL premix     1,000 mg 200 mL/hr over 60 Minutes Intravenous To ShortStay Surgical 07/05/17 1259 07/06/17 0815    .  She was given sequential compression devices, early ambulation, and Xarelto for DVT prophylaxis.  She benefited maximally from the hospital stay and there were no complications.    Recent vital signs:  Vitals:   07/07/17 2210 07/08/17 0500  BP: 136/66 (!) 147/72  Pulse: 98 (!) 101  Resp: 17 17  Temp: 99.6 F (37.6 C) 98.9 F (37.2 C)  SpO2: 94% 93%    Recent laboratory studies:  Lab Results  Component Value Date   HGB 10.1 (L) 07/08/2017   HGB 10.8 (L) 07/07/2017   HGB 14.2 06/25/2017   Lab Results  Component Value Date   WBC 7.6 07/08/2017   PLT 173 07/08/2017   Lab Results  Component Value Date   INR 1.30 03/12/2016   Lab Results  Component Value Date   NA 140 06/25/2017   K 3.6 06/25/2017   CL 106 06/25/2017   CO2 25  06/25/2017   BUN 11 06/25/2017   CREATININE 0.71 06/25/2017   GLUCOSE 84 06/25/2017    Discharge Medications:   Allergies as of 07/08/2017      Reactions   Bee Venom Swelling, Other (See Comments)   Patient is allergic to bee venom, mosquitos, wasps, yellow jackets and bees. Wherever she gets stung that area swells, turns hot and has fevers.     Other Anaphylaxis, Shortness Of Breath   H[place]ory smoke   Penicillins Other (See Comments)   PATIENT HAS HAD A PCN REACTION WITH IMMEDIATE RASH, FACIAL/TONGUE/THROAT SWELLING, SOB, OR LIGHTHEADEDNESS WITH HYPOTENSION:  #  #  #  YES  #  #  #   Has patient had a PCN reaction causing severe rash involving mucus membranes or skin necrosis: NO Has  patient had a PCN reaction that required hospitalization NO Has patient had a PCN reaction occurring within the last 10 years: NO If all of the above answers are "NO", then may proceed with Cephalosporin use.   Food Other (See Comments)   TANGERINES UNSPECIFIED CHILDHOOD REACTION    Tangerine Flavor    UNSPECIFIED REACTION    Latex Rash   Morphine And Related Itching      Medication List    TAKE these medications   amLODipine 5 MG tablet Commonly known as:  NORVASC Take 5 mg by mouth at bedtime.   CALTRATE 600+D PO Take 1 tablet by mouth daily.   fluticasone 0.05 % cream Commonly known as:  CUTIVATE Apply 1 application topically daily as needed (applied to dry spots on face).   levothyroxine 50 MCG tablet Commonly known as:  SYNTHROID, LEVOTHROID Take 50 mcg by mouth daily before breakfast.   multivitamin with minerals Tabs tablet Take 1 tablet by mouth 2 (two) times daily.   omeprazole 40 MG capsule Commonly known as:  PRILOSEC Take 40 mg by mouth at bedtime.   protein supplement shake Liqd Commonly known as:  PREMIER PROTEIN Take 11 oz by mouth daily as needed (for meal replacement). Chocolate   pseudoephedrine 30 MG tablet Commonly known as:  SUDAFED Take 30 mg by mouth every 6 (six) hours as needed (for allergies/congestion).   ranitidine 150 MG tablet Commonly known as:  ZANTAC Take 150 mg by mouth daily.   ursodiol 250 MG tablet Commonly known as:  ACTIGALL Take 250 mg by mouth 2 (two) times daily.   Vitamin D3 2000 units Tabs Take 4,000 Units by mouth daily.       Diagnostic Studies: Dg Knee Right Port  Result Date: 07/06/2017 CLINICAL DATA:  Postop right knee arthroplasty EXAM: PORTABLE RIGHT KNEE - 1-2 VIEW COMPARISON:  None. FINDINGS: Changes of right knee replacement. Soft tissue gas noted. No hardware bony complicating feature. IMPRESSION: Right knee replacement without complicating feature. Electronically Signed   By: Rolm Baptise M.D.   On:  07/06/2017 10:23    Disposition: 01-Home or Self Care    Follow-up Information    Renette Butters, MD Follow up.   Specialty:  Orthopedic Surgery Contact information: [address] [place] [address] [phone]        Care, [facility] Follow up.   Specialty:  [place] Why:  A representative from [facility] will contact you to arrange start date and time for your therapy. Should you not receive a call within 24hrs of your discharge call [phone]. Contact information: [place] [address] [phone]  Signed: Prudencio Burly III PA-C 07/08/2017, 7:03 AM

## 2017-07-07 NOTE — Progress Notes (Signed)
OT Evaluation  PTA, pt independent with ADL and mobility. Will follow acutely to complete education regarding compensatory techniques for ADL, safe tub transfer techniques and education on reducing risk of falls. Pt will be able to DC home most likely tomorrow with assistance form her mother. BP stable throughout session and pt asymptomatic.   07/07/17 1400  OT Visit Information  Last OT Received On 07/07/17  Assistance Needed +1  History of Present Illness Pt is a 61 y.o. female s/p elective R TKA on 07/06/17. Pertinent PMH includes HTN, DVT, bilateral PE, GERD, lupus, arthritis, anemia.  Precautions  Precautions Knee  Precaution Booklet Issued No  Precaution Comments Watch BP  Restrictions  Weight Bearing Restrictions Yes  RLE Weight Bearing WBAT  Home Living  Family/patient expects to be discharged to: Private residence  Living Arrangements Parent  Available Help at Discharge Family;Available 24 hours/day  Type of Home House  Home Access Stairs to enter  Entrance Stairs-Number of Steps 1  Entrance Stairs-Rails Right;Left  Home Layout Multi-level;Able to live on main level with bedroom/bathroom  Bathroom Shower/Tub Tub/shower unit;Curtain  Corporate treasurer Yes  Home Equipment None  Prior Function  Level of Independence Independent  Comments mom lives with her  Communication  Communication No difficulties  Pain Assessment  Pain Assessment 0-10  Pain Score 7  Pain Location R knee  Pain Descriptors / Indicators Sore;Discomfort;Aching  Pain Intervention(s) Limited activity within patient's tolerance  Cognition  Arousal/Alertness Awake/alert  Behavior During Therapy WFL for tasks assessed/performed  Overall Cognitive Status Within Functional Limits for tasks assessed  Upper Extremity Assessment  Upper Extremity Assessment Overall WFL for tasks assessed  Lower Extremity Assessment  Lower Extremity Assessment Defer to PT evaluation  Cervical  / Trunk Assessment  Cervical / Trunk Assessment Normal  ADL  Overall ADL's  Needs assistance/impaired  Upper Body Bathing Set up;Sitting  Lower Body Bathing Min guard;Sit to/from stand  Upper Body Dressing  Set up;Sitting  Lower Body Dressing Minimal assistance;Sit to/from Retail buyer Supervision/safety;RW;Comfort height toilet;Ambulation  Toileting- Water quality scientist and Hygiene Supervision/safety;Sit to/from stand  Functional mobility during ADLs Min guard;Rolling walker  General ADL Comments Began education on compensatory techniques for LB ADL; Educated on reducing risk of falls at home when using RW; mother will be able to assist as needed  Vision- History  Baseline Vision/History Wears glasses  Bed Mobility  Overal bed mobility Needs Assistance  Bed Mobility Supine to Sit;Sit to Supine  Supine to sit Supervision  Transfers  Overall transfer level Needs assistance  Equipment used Rolling walker (2 wheeled)  Transfers Sit to/from Stand  Sit to Stand Min guard  Balance  Overall balance assessment Needs assistance  Sitting-balance support No upper extremity supported;Feet supported  Sitting balance-Leahy Scale Good  Standing balance support Bilateral upper extremity supported  Standing balance-Leahy Scale Fair  OT - End of Session  Equipment Utilized During Treatment Gait belt;Rolling walker  Activity Tolerance Patient tolerated treatment well  Patient left in chair;with call bell/phone within reach;with chair alarm set;with family/visitor present  Nurse Communication Mobility status  CPM Right Knee  CPM Right Knee Off  OT Assessment  OT Recommendation/Assessment Patient needs continued OT Services  OT Visit Diagnosis Unsteadiness on feet (R26.81);Pain  Pain - Right/Left Right  Pain - part of body Knee  OT Problem List Decreased strength;Decreased range of motion;Decreased knowledge of use of DME or AE;Decreased knowledge of precautions;Pain  OT Plan  OT  Frequency (ACUTE ONLY) Min  2X/week  OT Treatment/Interventions (ACUTE ONLY) Self-care/ADL training;Therapeutic activities;Patient/family education  AM-PAC OT "6 Clicks" Daily Activity Outcome Measure  Help from another person eating meals? 4  Help from another person taking care of personal grooming? 4  Help from another person toileting, which includes using toliet, bedpan, or urinal? 3  Help from another person bathing (including washing, rinsing, drying)? 3  Help from another person to put on and taking off regular upper body clothing? 4  Help from another person to put on and taking off regular lower body clothing? 3  6 Click Score 21  ADL G Code Conversion CJ  OT Recommendation  Follow Up Recommendations No OT follow up;Supervision - Intermittent  OT Equipment 3 in 1 bedside commode  Individuals Consulted  Consulted and Agree with Results and Recommendations Patient;Family member/caregiver  Family Member Consulted mom  Acute Rehab OT Goals  Patient Stated Goal Return home  OT Goal Formulation With patient  Time For Goal Achievement 07/14/17  Potential to Achieve Goals Good  OT Time Calculation  OT Start Time (ACUTE ONLY) 1340  OT Stop Time (ACUTE ONLY) 1405  OT Time Calculation (min) 25 min  OT General Charges  $OT Visit 1 Visit  OT Evaluation  $OT Eval Low Complexity 1 Low  OT Treatments  $Self Care/Home Management  8-22 mins  [facility], OT/L  [phone] 07/07/2017

## 2017-07-07 NOTE — Progress Notes (Addendum)
Physical Therapy Treatment Patient Details Name: Erin Vasquez MRN: [id number] DOB: Jul 23, 1956 Today's Date: 07/07/2017    History of Present Illness Pt is a 61 y.o. female s/p elective R TKA on 07/06/17. Pertinent PMH includes HTN, DVT, bilateral PE, GERD, lupus, arthritis, anemia.   PT Comments    Pt limited by symptomatic changes in BP this session; RN aware (see BP values below). Improved technique with bed mobility and standing with RW, but symptoms of nausea, pallor, diaphoresis, and dizziness preventing mobility beyond marching and working on R knee ROM while standing EOB. Pt remains motivated to participate with therapy despite not feeling well. Will plan to progress mobility as able during this afternoon's session.  Supine BP: 144/67 Seated BP: 145/74  Standing BP: 125/94 Standing ~1 min BP: 94/70s Return to supine BP: 110/89 Supine ~3 min BP: 102/68    Follow Up Recommendations  DC plan and follow up therapy as arranged by surgeon;Home health PT     Equipment Recommendations  Rolling walker with 5" wheels;3in1 (PT)    Recommendations for Other Services OT consult     Precautions / Restrictions Precautions Precautions: Knee Precaution Comments: Watch BP Restrictions Weight Bearing Restrictions: Yes RLE Weight Bearing: Weight bearing as tolerated    Mobility  Bed Mobility Overal bed mobility: Needs Assistance Bed Mobility: Supine to Sit;Sit to Supine     Supine to sit: Supervision Sit to supine: Supervision   General bed mobility comments: Increased time and effort, but no physical assist required.   Transfers Overall transfer level: Needs assistance Equipment used: Rolling walker (2 wheeled) Transfers: Sit to/from Stand Sit to Stand: Min guard         General transfer comment: Cues for hand placement on RW. C/o dizziness in standing; BP 125/94 with initial standing, down to 94/70s with marching at EOB and standing knee flex/ext, pt symptomatic  (dizzy, pallor, nausea)  Ambulation/Gait             General Gait Details: Deferred secondary to symptomatic BP change   Stairs            Wheelchair Mobility    Modified Rankin (Stroke Patients Only)       Balance Overall balance assessment: Needs assistance Sitting-balance support: No upper extremity supported;Feet supported Sitting balance-Leahy Scale: Good     Standing balance support: Bilateral upper extremity supported Standing balance-Leahy Scale: Poor Standing balance comment: Reliant on BUE support                            Cognition Arousal/Alertness: Awake/alert Behavior During Therapy: WFL for tasks assessed/performed Overall Cognitive Status: Within Functional Limits for tasks assessed                                        Exercises      General Comments        Pertinent Vitals/Pain Pain Assessment: Faces Faces Pain Scale: Hurts a little bit Pain Location: R knee Pain Descriptors / Indicators: Sore;Discomfort Pain Intervention(s): Monitored during session;Repositioned    Home Living                      Prior Function            PT Goals (current goals can now be found in the care plan section) Acute Rehab PT Goals Patient  Stated Goal: Return home PT Goal Formulation: With patient Time For Goal Achievement: 07/20/17 Potential to Achieve Goals: Good Progress towards PT goals: Not progressing toward goals - comment (Limited by symptomatic BP changes)    Frequency    7X/week      PT Plan      Co-evaluation              AM-PAC PT "6 Clicks" Daily Activity  Outcome Measure  Difficulty turning over in bed (including adjusting bedclothes, sheets and blankets)?: A Little Difficulty moving from lying on back to sitting on the side of the bed? : A Little Difficulty sitting down on and standing up from a chair with arms (e.g., wheelchair, bedside commode, etc,.)?: A Little Help  needed moving to and from a bed to chair (including a wheelchair)?: A Little Help needed walking in hospital room?: A Little Help needed climbing 3-5 steps with a railing? : A Little 6 Click Score: 18    End of Session Equipment Utilized During Treatment: Gait belt Activity Tolerance: Treatment limited secondary to medical complications (Comment) Patient left: in bed;with call bell/phone within reach Nurse Communication: Mobility status;Other (comment) (BP changes) PT Visit Diagnosis: Other abnormalities of gait and mobility (R26.89);Pain Pain - Right/Left: Right Pain - part of body: Knee     Time: 1610-9604 PT Time Calculation (min) (ACUTE ONLY): 33 min  Charges:  $Therapeutic Activity: 23-37 mins                    G Codes:      Mabeline Caras, PT, DPT Acute Rehab Services  Pager: [place] 07/07/2017, 10:18 AM

## 2017-07-08 ENCOUNTER — Encounter (HOSPITAL_COMMUNITY): Payer: Self-pay | Admitting: Orthopedic Surgery

## 2017-07-08 LAB — CBC
HCT: 31.3 % — ABNORMAL LOW (ref 36.0–46.0)
HEMOGLOBIN: 10.1 g/dL — AB (ref 12.0–15.0)
MCH: 29.5 pg (ref 26.0–34.0)
MCHC: 32.3 g/dL (ref 30.0–36.0)
MCV: 91.5 fL (ref 78.0–100.0)
Platelets: 173 10*3/uL (ref 150–400)
RBC: 3.42 MIL/uL — ABNORMAL LOW (ref 3.87–5.11)
RDW: 13.8 % (ref 11.5–15.5)
WBC: 7.6 10*3/uL (ref 4.0–10.5)

## 2017-07-08 NOTE — Therapy (Signed)
Occupational Therapy Treatment Patient Details Name: Erin Vasquez MRN: [id number] DOB: 1956/07/17 Today's Date: 07/08/2017    History of present illness Pt is a 61 y.o. female s/p elective R TKA on 07/06/17. Pertinent PMH includes HTN, DVT, bilateral PE, GERD, lupus, arthritis, anemia.   OT comments  Focus of today's session on increased independence with functional mobility and ADLs. Pt able to return demonstration of tub shower transfer with min guard for safety. Reviewed compensatory techniques for LB ADLs. Pt is safe to d/c home with intermittent supervision from family. OT will continue to follow while admitted.    Follow Up Recommendations  No OT follow up;Supervision - Intermittent    Equipment Recommendations  3 in 1 bedside commode    Recommendations for Other Services      Precautions / Restrictions Precautions Precautions: Knee Precaution Booklet Issued: No Precaution Comments: Verbally reviewed precautions Restrictions Weight Bearing Restrictions: Yes RLE Weight Bearing: Weight bearing as tolerated       Mobility Bed Mobility            General bed mobility comments: Pt sitting in chair upon arrival.   Transfers Overall transfer level: Needs assistance Equipment used: Rolling walker (2 wheeled) Transfers: Sit to/from Stand Sit to Stand: Min guard         General transfer comment: min guard for safety and increased time     Balance Overall balance assessment: Needs assistance Sitting-balance support: No upper extremity supported;Feet supported Sitting balance-Leahy Scale: Good     Standing balance support: Bilateral upper extremity supported;No upper extremity supported Standing balance-Leahy Scale: Good Standing balance comment: able to complete tub shower transfer with no LOB                            ADL either performed or assessed with clinical judgement   ADL Overall ADL's : Needs assistance/impaired                        Lower Body Dressing Details (indicate cue type and reason): Reviewed LB dressing compensatory techniques.          Tub/ Shower Transfer: Min guard;Ambulation;3 in 1;Rolling walker Tub/Shower Transfer Details (indicate cue type and reason): Pt educated on tub shower transfer with RW and 3in1. Pt able to return demonstration of transfer with good technique and min guard for safety. Pt advised to have family available initally when showering.  Functional mobility during ADLs: Passenger transport manager     Praxis      Cognition Arousal/Alertness: Awake/alert Behavior During Therapy: WFL for tasks assessed/performed Overall Cognitive Status: Within Functional Limits for tasks assessed                                          Exercises     Shoulder Instructions       General Comments Pt reports no concerns about going home and stated that her family will be able to assist as needed upon d/c.     Pertinent Vitals/ Pain       Pain Assessment: 0-10 Pain Score: 4  Faces Pain Scale: Hurts a little bit Pain Location: R knee Pain Descriptors / Indicators: Sore Pain Intervention(s): Limited activity within patient's tolerance;Monitored during session  Home  Living                                          Prior Functioning/Environment              Frequency  Min 2X/week        Progress Toward Goals  OT Goals(current goals can now be found in the care plan section)  Progress towards OT goals: Progressing toward goals  Acute Rehab OT Goals Patient Stated Goal: Return home OT Goal Formulation: With patient Time For Goal Achievement: 07/14/17 Potential to Achieve Goals: Good ADL Goals Pt Will Perform Tub/Shower Transfer: with supervision;with set-up;with caregiver independent in assisting;3 in 1;rolling walker;ambulating Additional ADL Goal #1: Pt will independently verbalize 3 strategies  to reduce risk of falls.  Plan Discharge plan remains appropriate    Co-evaluation                 AM-PAC PT "6 Clicks" Daily Activity     Outcome Measure   Help from another person eating meals?: None Help from another person taking care of personal grooming?: None Help from another person toileting, which includes using toliet, bedpan, or urinal?: A Little Help from another person bathing (including washing, rinsing, drying)?: A Little Help from another person to put on and taking off regular upper body clothing?: None Help from another person to put on and taking off regular lower body clothing?: A Little 6 Click Score: 21    End of Session Equipment Utilized During Treatment: Gait belt;Rolling walker CPM Right Knee CPM Right Knee: Off Additional Comments: Sitting up eating breakfast  OT Visit Diagnosis: Unsteadiness on feet (R26.81);Pain Pain - Right/Left: Right Pain - part of body: Knee   Activity Tolerance Patient tolerated treatment well   Patient Left in chair;with call bell/phone within reach;with chair alarm set;with family/visitor present   Nurse Communication Mobility status        Time: 9983-3825 OT Time Calculation (min): 21 min  Charges:    Boykin Peek, OTS [phone]   Boykin Peek 07/08/2017, 11:20 AM

## 2017-07-08 NOTE — Progress Notes (Signed)
OT Note Addendum for Charges    07/08/17 1115  OT Time Calculation  OT Start Time (ACUTE ONLY) 1028  OT Stop Time (ACUTE ONLY) 1049  OT Time Calculation (min) 21 min  OT General Charges  $OT Visit 1 Visit  OT Treatments  $Self Care/Home Management  8-22 mins   Almetta Liddicoat A. Ulice Brilliant, M.S., OTR/L Pager: [phone]

## 2017-07-08 NOTE — Progress Notes (Addendum)
Discharge instructions reviewed with pt and family.  Copy of instructions given to pt, no scripts, pt states she was given all scripts prior to admit and were filled already, which included pain medication. Equipment ordered for pt in room and sent with pt--3-in-1, walker, bone foam also sent with pt for home use. Pt also fitted for thigh hi teds today and applied, pt was instructed by office RN CM this am on use and when to wear.  Pt d/c'd via wheelchair with belongings, with family.          Escorted by unit NT and nursing students.

## 2017-07-08 NOTE — Progress Notes (Signed)
Physical Therapy Treatment Patient Details Name: Erin Vasquez MRN: [id number] DOB: 09/22/1955 Today's Date: 07/08/2017    History of Present Illness Pt is a 61 y.o. female s/p elective R TKA on 07/06/17. Pertinent PMH includes HTN, DVT, bilateral PE, GERD, lupus, arthritis, anemia.   PT Comments    Pt has progressed well with mobility. Mod indep for transfers and amb with RW; able to ascend/descend 1 step with RW and supervision for safety. Pt has met acute short-term PT goals. All education completed and pt has no further questions. Feel pt is safe to return home with supervision from mother as needed and HHPT. D/c acute PT.   Follow Up Recommendations  DC plan and follow up therapy as arranged by surgeon;Home health PT     Equipment Recommendations  Rolling walker with 5" wheels;3in1 (PT)    Recommendations for Other Services       Precautions / Restrictions Precautions Precautions: Knee Precaution Comments: Verbally reviewed precautions Restrictions Weight Bearing Restrictions: Yes RLE Weight Bearing: Weight bearing as tolerated    Mobility  Bed Mobility Overal bed mobility: Independent Bed Mobility: Supine to Sit              Transfers Overall transfer level: Modified independent Equipment used: Rolling walker (2 wheeled) Transfers: Sit to/from Stand              Ambulation/Gait Ambulation/Gait assistance: Modified independent (Device/Increase time) Ambulation Distance (Feet): 140 Feet Assistive device: Rolling walker (2 wheeled) Gait Pattern/deviations: Step-through pattern;Antalgic;Decreased weight shift to right;Decreased step length - left Gait velocity: Decreased Gait velocity interpretation: <1.8 ft/sec, indicative of risk for recurrent falls     Stairs Stairs: Yes   Stair Management: Step to pattern;Backwards;Forwards;With walker Number of Stairs: 1 General stair comments: Ascend/descended 1 step with RW to simulate pt's step into  home; educ on technique. Supervision for safety  Wheelchair Mobility    Modified Rankin (Stroke Patients Only)       Balance Overall balance assessment: Needs assistance Sitting-balance support: No upper extremity supported;Feet supported Sitting balance-Leahy Scale: Good     Standing balance support: Bilateral upper extremity supported;No upper extremity supported Standing balance-Leahy Scale: Fair Standing balance comment: Able to static stand with no UE support                            Cognition Arousal/Alertness: Awake/alert Behavior During Therapy: WFL for tasks assessed/performed Overall Cognitive Status: Within Functional Limits for tasks assessed                                        Exercises      General Comments        Pertinent Vitals/Pain Pain Assessment: Faces Faces Pain Scale: Hurts a little bit Pain Location: R knee Pain Descriptors / Indicators: Sore Pain Intervention(s): Monitored during session    Home Living                      Prior Function            PT Goals (current goals can now be found in the care plan section) Acute Rehab PT Goals Patient Stated Goal: Return home PT Goal Formulation: With patient Time For Goal Achievement: 07/20/17 Potential to Achieve Goals: Good Progress towards PT goals: Progressing toward goals    Frequency  7X/week      PT Plan Current plan remains appropriate    Co-evaluation              AM-PAC PT "6 Clicks" Daily Activity  Outcome Measure  Difficulty turning over in bed (including adjusting bedclothes, sheets and blankets)?: None Difficulty moving from lying on back to sitting on the side of the bed? : None Difficulty sitting down on and standing up from a chair with arms (e.g., wheelchair, bedside commode, etc,.)?: None Help needed moving to and from a bed to chair (including a wheelchair)?: None Help needed walking in hospital room?:  None Help needed climbing 3-5 steps with a railing? : A Little 6 Click Score: 23    End of Session Equipment Utilized During Treatment: Gait belt Activity Tolerance: Patient tolerated treatment well Patient left: in chair;with call bell/phone within reach Nurse Communication: Mobility status PT Visit Diagnosis: Other abnormalities of gait and mobility (R26.89);Pain Pain - Right/Left: Right Pain - part of body: Knee     Time: 0802-2336 PT Time Calculation (min) (ACUTE ONLY): 20 min  Charges:  $Gait Training: 8-22 mins                    G Codes:      Mabeline Caras, PT, DPT Acute Rehab Services  Pager: [place] 07/08/2017, 8:17 AM

## 2017-07-08 NOTE — Progress Notes (Signed)
   Assessment / Plan: 2 Days Post-Op  S/P Procedure(s) (LRB): RIGHT TOTAL KNEE ARTHROPLASTY (Right) by Dr. Ernesta Amble. [name] on 07/06/17  Principal Problem:   Primary osteoarthritis of right knee Active Problems:   Thyroid activity decreased   Left shoulder pain   GERD (gastroesophageal reflux disease)   History of DVT (deep vein thrombosis)   History of pulmonary embolus (PE) Acute Blood loss anemia - likely with additional dilutional component with increased fluids dt hypotension yesterday.  Platelets WNL and increasing.  No sign of active bleeding.    S/P Right Total Knee Arthroplasty: Significantly improved mobilization. Hypotension yesterday improved with fluids.  Asymptomatic symptoms during P.M. Therapy session. Pain well controlled Tolerating diet and urinating Patient desires discharge to home today   Up with therapy - working on stairs this morning. Incentive Spirometry Elevate and apply ice CPM / bone foam.  Weight Bearing: Weight Bearing as Tolerated (WBAT)  Dressings: Maintain Mepilex.  VTE prophylaxis: Xarelto, SCDs, ambulation Dispo: Home w/ Home Heath Therapy later today assuming she continues to mobilize safely with therapy. The patient already has her postoperative prescriptions.  Subjective: Patient reports pain as mild to moderate.  Tolerating diet.  Urinating.  No CP, SOB.  OOB mobilizing well in hallway.  Objective:   VITALS:   Vitals:   07/07/17 0650 07/07/17 1530 07/07/17 2210 07/08/17 0500  BP: (!) 113/55 129/72 136/66 (!) 147/72  Pulse: 70 83 98 (!) 101  Resp:  16 17 17   Temp:  98.6 F (37 C) 99.6 F (37.6 C) 98.9 F (37.2 C)  TempSrc:  Oral Oral Oral  SpO2:  94% 94% 93%   CBC Latest Ref Rng & Units 07/08/2017 07/07/2017 06/25/2017  WBC 4.0 - 10.5 K/uL 7.6 8.1 6.3  Hemoglobin 12.0 - 15.0 g/dL 10.1(L) 10.8(L) 14.2  Hematocrit 36.0 - 46.0 % 31.3(L) 31.5(L) 44.4  Platelets 150 - 400 K/uL 173 167 245   BMP Latest Ref Rng & Units  06/25/2017 04/22/2017 03/12/2016  Glucose 65 - 99 mg/dL 84 90 82  BUN 6 - 20 mg/dL 11 7 22(H)  Creatinine 0.44 - 1.00 mg/dL 0.71 0.75 1.12(H)  Sodium 135 - 145 mmol/L 140 143 139  Potassium 3.5 - 5.1 mmol/L 3.6 3.4(L) 4.4  Chloride 101 - 111 mmol/L 106 107 106  CO2 22 - 32 mmol/L 25 28 27   Calcium 8.9 - 10.3 mg/dL 9.2 9.3 9.6   Intake/Output      10/24 0701 - 10/25 0700   P.O. 480   I.V. 3425   IV Piggyback 0   Total Intake 3905   Net +3905       Urine Occurrence 4 x     Physical Exam: General: NAD.  Supine in bed.   Resp: No increased wob.  Clear A/P without crackle / wheeze. Cardio: regular rate and rhythm ABD soft, protuberant Neurologically intact MSK RLE: Neurovascularly intact Sensation intact distally Intact pulses distally Dorsiflexion/Plantar flexion intact Incision: dressing C/D/I   Prudencio Burly III, PA-C 07/08/2017, 6:42 AM

## 2017-09-01 ENCOUNTER — Encounter: Attending: Plastic Surgery | Primary: Family Medicine

## 2017-10-28 ENCOUNTER — Encounter: Payer: Self-pay | Admitting: *Deleted

## 2017-10-29 NOTE — Progress Notes (Signed)
[facility] Report   Patient Details  Name: Erin Vasquez MRN: [id number] Date of Birth: 20-Sep-1955 Age: 62 y.o. PCP: Katherina Mires, MD  Vitals:   10/29/17 1356  BP: (!) 150/86  Pulse: (!) 55  Resp: 16  SpO2: 94%  Weight: 177 lb 6.4 oz (80.5 kg)     [facility] Eval - 10/29/17 1300      Referral    Referring Provider  Dr. Volanda Napoleon    Reason for referral  Inactivity;Orthopedic    Program Start Date  10/29/17      Measurement   Waist Circumference  35 inches    Hip Circumference  41 inches    Body fat  42.2 percent      Information for Trainer   Goals  "to lose 25 more pounds and tone up"    Current Exercise  "none"    Orthopedic Concerns  recent RTKR 06/2017, Lt shoulder"    Pertinent Medical History  see chart    Current Barriers  LT shoulder      Mobility and Daily Activities   I find it easy to walk up or down two or more flights of stairs.  2    I have no trouble taking out the trash.  4    I do housework such as vacuuming and dusting on my own without difficulty.  4    I can easily lift a gallon of milk (8lbs).  2    I can easily walk a mile.  1    I have no trouble reaching into high cupboards or reaching down to pick up something from the floor.  1    I do not have trouble doing out-door work such as Armed forces logistics/support/administrative officer, raking leaves, or gardening.  3      Mobility and Daily Activities   I feel younger than my age.  4    I feel independent.  4    I feel energetic.  3    I live an active life.   3    I feel strong.  3    I feel healthy.  3    I feel active as other people my age.  2      How fit and strong are you.   Fit and Strong Total Score  39      Past Medical History:  Diagnosis Date  . Anemia    during pregnancy  . Arthritis    "all over" (07/07/2017)  . Bilateral pulmonary embolism ([place]) 10/29/2015   "from my left ankle"  . Childhood asthma    as child, nothing as adult  . Chronic back pain    "all over" (07/07/2017)  .  DVT (deep venous thrombosis) ([place]) 10/29/2015   "from my left ankle"  . GERD (gastroesophageal reflux disease)    tx. Tums  . H/O hiatal hernia    "repaired w/gastric bypass OR" (07/07/2017)  . History of esophageal dilatation   . Hypertension   . Hypothyroidism   . Lupus anticoagulant disorder (HCC)    Dr. Alen Blew   . Neuromuscular disorder ([place])    L thigh - Numbness, nerve damage    . Sacroiliac pain    "left thigh has been numb since 01/2016 after appendectomy" (07/07/2017)  . Sleep apnea    07/07/2017 "bariatric dr says I don't need CPAP anymore" (07/07/2017)  . Thyroid disease   . Uterine cancer ([place]) 01/2014  . Varicose veins   .  Ventral hernia   . Vitamin D deficiency    Past Surgical History:  Procedure Laterality Date  . ABDOMINAL HYSTERECTOMY  01/2014   "robotic"  . APPENDECTOMY     ruptured 01/2016  . BALLOON DILATION N/A 08/02/2013   Procedure: BALLOON DILATION;  Surgeon: Arta Silence, MD;  Location: [facility];  Service: Endoscopy;  Laterality: N/A;  . BUNIONECTOMY WITH HAMMERTOE RECONSTRUCTION Left ~ 2005  . COLONOSCOPY WITH PROPOFOL N/A 08/02/2013   Procedure: COLONOSCOPY WITH PROPOFOL;  Surgeon: Arta Silence, MD;  Location: [facility];  Service: Endoscopy;  Laterality: N/A;  . DILATION AND CURETTAGE OF UTERUS  x2   S/P miscarriage  . ESOPHAGOGASTRODUODENOSCOPY (EGD) WITH ESOPHAGEAL DILATION  ~ 2003  . ESOPHAGOGASTRODUODENOSCOPY (EGD) WITH PROPOFOL N/A 08/02/2013   Procedure: ESOPHAGOGASTRODUODENOSCOPY (EGD) WITH PROPOFOL;  Surgeon: Arta Silence, MD;  Location: [facility];  Service: Endoscopy;  Laterality: N/A;  . HERNIA REPAIR  04/2017   VHR  . INSERTION OF MESH N/A 04/28/2017   Procedure: POSSIBLE INSERTION OF MESH;  Surgeon: Coralie Keens, MD;  Location: [facility];  Service: General;  Laterality: N/A;  . JOINT REPLACEMENT    . LAPAROSCOPIC APPENDECTOMY N/A 02/07/2016   Procedure: LAPAROSCOPIC APPENDECTOMY;  Surgeon: Coralie Keens, MD;   Location: [facility];  Service: General;  Laterality: N/A;  . ROBOTIC ASSISTED TOTAL HYSTERECTOMY WITH BILATERAL SALPINGO OOPHERECTOMY  01/23/2014   uterine cancer  . ROUX-EN-Y GASTRIC BYPASS  11/2016   Cherrie Gauze  . SHOULDER ARTHROSCOPY WITH DEBRIDEMENT AND BICEP TENDON REPAIR Left 09/11/2014   Procedure: SHOULDER ARTHROSCOPY WITH DEBRIDEMENT AND BICEP TENDON REPAIR;  Surgeon: Renette Butters, MD;  Location: [facility];  Service: Orthopedics;  Laterality: Left;  . SHOULDER ARTHROSCOPY WITH DISTAL CLAVICLE RESECTION Left 09/11/2014   Procedure: SHOULDER ARTHROSCOPY WITH DISTAL CLAVICLE RESECTION;  Surgeon: Renette Butters, MD;  Location: [place];  Service: Orthopedics;  Laterality: Left;  . SHOULDER ARTHROSCOPY WITH SUBACROMIAL DECOMPRESSION Left 09/11/2014   Procedure: SHOULDER ARTHROSCOPY WITH SUBACROMIAL DECOMPRESSION;  Surgeon: Renette Butters, MD;  Location: [place];  Service: Orthopedics;  Laterality: Left;  . TONSILLECTOMY    . TOTAL KNEE ARTHROPLASTY Right 07/06/2017  . TOTAL KNEE ARTHROPLASTY Right 07/06/2017   Procedure: RIGHT TOTAL KNEE ARTHROPLASTY;  Surgeon: Renette Butters, MD;  Location: [place];  Service: Orthopedics;  Laterality: Right;  . VENTRAL HERNIA REPAIR N/A 04/28/2017   Procedure: HERNIA REPAIR VENTRAL ADULT;  Surgeon: Coralie Keens, MD;  Location: [facility];  Service: General;  Laterality: N/A;   Social History   Tobacco Use  Smoking Status Never Smoker  Smokeless Tobacco Former Systems developer  . Types: Chew  Tobacco Comment   "chewed as a teen"     [name] has rejoined the PREP after having a knee replacement.  She will be doing it this time with her life-partner.  They will be joining the Wed/Friday 11-noon small group training sessions starting 11/03/2017.   [name] 10/29/2017, 1:59 PM

## 2017-11-03 NOTE — Progress Notes (Signed)
[facility] [employer] [facility] Weekly Session   Patient Details  Name: Erin Vasquez MRN: [id number] Date of Birth: 05/07/1956 Age: 62 y.o. PCP: Katherina Mires, MD  Vitals:   11/03/17 1350  Weight: 178 lb 9.6 oz (81 kg)    [facility] [employer] Weekly seesion - 11/03/17 1300      Weekly Session   Topic Discussed  Other ways to be active    Classes attended to date  1      Fun things you did since last meeting:"Spent time w/mom & Sister" Things you are grateful for:"Family, faith, health & [name] at [facility]:)" Nutrition celebrations:Eating small amounts" Barriers:"getting in proteins"   [name] 11/03/2017, 1:51 PM

## 2017-11-10 ENCOUNTER — Encounter: Payer: Self-pay | Admitting: *Deleted

## 2017-11-10 ENCOUNTER — Ambulatory Visit (INDEPENDENT_AMBULATORY_CARE_PROVIDER_SITE_OTHER): Payer: Self-pay | Admitting: *Deleted

## 2017-11-10 ENCOUNTER — Encounter: Attending: Plastic Surgery | Primary: Family Medicine

## 2017-11-10 DIAGNOSIS — I83893 Varicose veins of bilateral lower extremities with other complications: Secondary | ICD-10-CM

## 2017-11-10 NOTE — Progress Notes (Signed)
X=.3% Sotradecol administered with a 27g butterfly.  Patient received a total of 12cc.  Treated all areas of concern with 2 syringes. Easy access. Tol well. Anticipate good results. Follow prn.  Note: Received clearance from Dr. Alen Blew for her to have sclero.  Photos: Yes.    Compression stockings applied: Yes.

## 2017-11-15 NOTE — Progress Notes (Signed)
[facility] [employer] PRE[facility] Weekly Session   Patient Details  Name: Erin Vasquez MRN: [id number] Date of Birth: [birth year] Age: 62 y.o. PCP: Katherina Mires, MD  Vitals:   11/10/17 0931  Weight: 178 lb 3.2 oz (80.8 kg)    [facility] Weekly seesion - 11/15/17 0900      Weekly Session   Topic Discussed  Health habits    Minutes exercised this week  15 minutes 5cardio/5strength/75flexibility   5cardio/5strength/19flexibility   Classes attended to date  2      Fun things you did since last meeting:"spent time w/family" Things you ae grateful for:"God, health, family, church and [employer]" Barriers:"proteins & water intake"   [name] 11/15/2017, 9:31 AM

## 2017-11-19 NOTE — Progress Notes (Signed)
[facility] [employer] PRE[facility] Weekly Session   Patient Details  Name: ZENAYA ULATOWSKI MRN: [id number] Date of Birth: 18-Feb-1956 Age: 62 y.o. PCP: Katherina Mires, MD  Vitals:   11/15/17 1348  Weight: 177 lb 6.4 oz (80.5 kg)    [facility] Weekly seesion - 11/15/17 1349      Weekly Session   Topic Discussed  Restaurant Eating    Minutes exercised this week  40 minutes 30cardio/66flexibility   30cardio/29flexibility   Classes attended to date  3      Fun things you've done since last meeting:"Granddaughter Chair concert" Things you are grateful for:"God, health, family, [employer], [name], and [place]" Barriers:"water and protein intake"   [name] 11/19/2017, 1:50 PM

## 2017-11-24 ENCOUNTER — Institutional Professional Consult (permissible substitution): Admit: 2017-11-24 | Discharge: 2017-11-24 | Attending: Plastic Surgery | Primary: Family Medicine

## 2017-11-24 DIAGNOSIS — Z411 Encounter for cosmetic surgery: Secondary | ICD-10-CM

## 2017-11-24 NOTE — Progress Notes (Signed)
Bra Size= 40 F  2 Children (44,21)  Done having Children  Hx of C-Section x1  Hx of Breast feeding X2  No Hx of Breast Cancer in Patient or Family  No Hx of breast Surgery  Normal Mammogram  No Diabetes or Nicotine use  Disabled from back pain, Hx of neck, back shoulder and breast pain  Hx of Chiropractor, and Physical Therapy with no relief   Positive Hx of breast Rashing and shoulder grooving  No Change in Breast Size with Weight loss  Tries to be active;however,  Very Difficult with Severe back pain  Measurements  Right=34.5  Left=35.5  Symmetry=Right larger than left  Density= Moderate density  Breasts are too long for my operation technique = [place]   No Charge for todays visit  She will call her primary care to change surgeons.    Patient was seen in the room with my nurse at all times.  Portions of this note were dictated on the Dragon Medical system and certain variabilities may be present due to voice-recognition.

## 2017-11-26 NOTE — Progress Notes (Signed)
[facility] [employer] PREP Weekly Session   Patient Details  Name: Erin Vasquez MRN: [id number] Date of Birth: 1955/10/12 Age: 62 y.o. PCP: Katherina Mires, MD  Vitals:   11/26/17 1255  Weight: 172 lb 12.8 oz (78.4 kg)    [facility] Weekly seesion - 11/26/17 1200      Weekly Session   Topic Discussed  Stress management and problem solving    Minutes exercised this week  75 minutes 15cardio/60strength   15cardio/60strength   Classes attended to date  4      Fun things you did since last meeting:"cub/boyscout camp" Things you are grateful for:"God, family, health, weight loss, [employer]" Nutrition celebrations for the week:"less salt" Barriers:"more water & protein"  [name] 11/26/2017, 12:56 PM

## 2017-12-06 NOTE — Progress Notes (Signed)
[facility] PREP Weekly Session   Patient Details  Name: Erin Vasquez MRN: [id number] Date of Birth: [birth year] Age: 62 y.o. PCP: Katherina Mires, MD  Vitals:   12/03/17 0947  Weight: 173 lb (78.5 kg)    [facility] Weekly seesion - 12/06/17 0900      Weekly Session   Topic Discussed  Importance of resistance training    Minutes exercised this week  190 minutes 60cardio/90strength/25flexibility   60cardio/90strength/41flexibility   Classes attended to date  5      Things you are grateful for:"Jesus, family, health, [employer] (except free weights-LOL)" Nutrition celebrations:"Ate plenty of protein" Barriers:"water intake"  [name] 12/06/2017, 9:48 AM

## 2017-12-20 NOTE — Progress Notes (Signed)
[facility] PREP Weekly Session   Patient Details  Name: Erin Vasquez MRN: [id number] Date of Birth: 1956/06/06 Age: 62 y.o. PCP: Katherina Mires, MD  Vitals:   12/17/17 1025  Weight: 177 lb (80.3 kg)    [facility] Weekly seesion - 12/20/17 1000      Weekly Session   Topic Discussed  Hitting roadblocks    Minutes exercised this week  215 minutes 110cardio/90strength/94flexibility   110cardio/90strength/18flexibility   Classes attended to date  6      Fun things you did since last meeting:"Grandkids soccer games, granddaughter bday party." Things you are grateful for:"Family, faith" Barriers:"getting more time in at the Y"  [name] 12/20/2017, 10:26 AM

## 2017-12-24 NOTE — Progress Notes (Signed)
[facility] PREP Weekly Session   Patient Details  Name: Erin Vasquez MRN: [id number] Date of Birth: 1956/09/07 Age: 62 y.o. PCP: Katherina Mires, MD  Vitals:   12/24/17 1244  Weight: 174 lb 3.2 oz (79 kg)    [facility] Weekly seesion - 12/24/17 1200      Weekly Session   Topic Discussed  Other portion control   portion control   Minutes exercised this week  110 minutes 30cardio/75strength/41flexibility   30cardio/75strength/102flexibility   Classes attended to date  7      Fun things you did since last meeting:"Discovered [employer] in [place]." Things you are grateful for:"God, healing" Nutrition celebrations:"Losing back to my lost weight" Barriers:"water intake"   [name] 12/24/2017, 12:45 PM

## 2017-12-31 NOTE — Progress Notes (Signed)
[facility] PREP Weekly Session   Patient Details  Name: Erin Vasquez MRN: [id number] Date of Birth: Sep 07, 1956 Age: 62 y.o. PCP: Katherina Mires, MD  Vitals:   12/31/17 1332  Weight: 173 lb 3.2 oz (78.6 kg)    [facility] Weekly seesion - 12/31/17 1300      Weekly Session   Topic Discussed  Finding support    Minutes exercised this week  225 minutes 40cardio/165strength/77flexibility   40cardio/165strength/71flexibility   Classes attended to date  8      Fun things you did since last meeting:"Soccer games" Things you are grateful for:"God, family, health, [employer], [name] & [name]" Barriers:"water intake"  [name] 12/31/2017, 1:33 PM

## 2018-02-10 ENCOUNTER — Telehealth: Payer: Self-pay | Admitting: *Deleted

## 2018-02-10 NOTE — Telephone Encounter (Addendum)
Received a clearance form from [place] for surgery clearance (Left total knee replacement) from neurology standpoint.   Pt was only seen for an EMG back in 2017. Dr. Jaynee Eagles is not her treating physician so pt could not be cleared. This was noted on the form, signed by Dr. Jaynee Eagles. Faxed to [place]. Received a receipt of confirmation.

## 2018-03-29 DIAGNOSIS — R600 Localized edema: Secondary | ICD-10-CM | POA: Insufficient documentation

## 2018-04-01 ENCOUNTER — Other Ambulatory Visit: Payer: Self-pay | Admitting: Family Medicine

## 2018-04-01 DIAGNOSIS — Z1231 Encounter for screening mammogram for malignant neoplasm of breast: Secondary | ICD-10-CM

## 2018-04-04 DIAGNOSIS — M1712 Unilateral primary osteoarthritis, left knee: Secondary | ICD-10-CM | POA: Diagnosis present

## 2018-04-04 NOTE — H&P (Signed)
KNEE ARTHROPLASTY ADMISSION H&P  Patient ID: Erin Vasquez MRN: [id number] DOB/AGE: 1955/12/04 62 y.o.  Chief Complaint: left knee pain.  Planned Procedure Date: 04/19/18 Medical and Cardiac Clearance by Dr. Nathaniel Man    Neuro: Dr. Jaynee Eagles.  Vascular: Dr. Kellie Simmering.  Heme/Onc: Dr. Alen Blew.   HPI: Erin Vasquez is a 62 y.o. female with a history of gastric bypass, DVT / PE, GERD, Hypothyroidism, and Left shoulder irreparable RTC s/p debridement, biceps tenotomy, DCE.  She presents for evaluation of OA LEFT KNEE. The patient has a history of pain and functional disability in the left knee due to arthritis and has failed non-surgical conservative treatments for greater than 12 weeks to include viscosupplementation injections, weight reduction as appropriate and activity modification.  Onset of symptoms was gradual, starting 3 years ago with gradually worsening course since that time.  Patient currently rates pain at 7 out of 10 with activity. Patient has night pain, worsening of pain with activity and weight bearing and pain that interferes with activities of daily living.  Patient has evidence of periarticular osteophytes and joint space narrowing by imaging studies.  There is no active infection.  Past Medical History:  Diagnosis Date  . Anemia    during pregnancy  . Arthritis    "all over" (07/07/2017)  . Bilateral pulmonary embolism ([place]) 10/29/2015   "from my left ankle"  . Childhood asthma    as child, nothing as adult  . Chronic back pain    "all over" (07/07/2017)  . DVT (deep venous thrombosis) ([place]) 10/29/2015   "from my left ankle"  . GERD (gastroesophageal reflux disease)    tx. Tums  . H/O hiatal hernia    "repaired w/gastric bypass OR" (07/07/2017)  . History of esophageal dilatation   . Hypertension   . Hypothyroidism   . Lupus anticoagulant disorder (HCC)    Dr. Alen Blew   . Neuromuscular disorder ([place])    L thigh - Numbness, nerve damage    . Sacroiliac pain    "left  thigh has been numb since 01/2016 after appendectomy" (07/07/2017)  . Sleep apnea    07/07/2017 "bariatric dr says I don't need CPAP anymore" (07/07/2017)  . Thyroid disease   . Uterine cancer ([place]) 01/2014  . Varicose veins   . Ventral hernia   . Vitamin D deficiency    Past Surgical History:  Procedure Laterality Date  . ABDOMINAL HYSTERECTOMY  01/2014   "robotic"  . APPENDECTOMY     ruptured 01/2016  . BALLOON DILATION N/A 08/02/2013   Procedure: BALLOON DILATION;  Surgeon: Arta Silence, MD;  Location: [facility];  Service: Endoscopy;  Laterality: N/A;  . BUNIONECTOMY WITH HAMMERTOE RECONSTRUCTION Left ~ 2005  . COLONOSCOPY WITH PROPOFOL N/A 08/02/2013   Procedure: COLONOSCOPY WITH PROPOFOL;  Surgeon: Arta Silence, MD;  Location: [facility];  Service: Endoscopy;  Laterality: N/A;  . DILATION AND CURETTAGE OF UTERUS  x2   S/P miscarriage  . ESOPHAGOGASTRODUODENOSCOPY (EGD) WITH ESOPHAGEAL DILATION  ~ 2003  . ESOPHAGOGASTRODUODENOSCOPY (EGD) WITH PROPOFOL N/A 08/02/2013   Procedure: ESOPHAGOGASTRODUODENOSCOPY (EGD) WITH PROPOFOL;  Surgeon: Arta Silence, MD;  Location: [facility];  Service: Endoscopy;  Laterality: N/A;  . HERNIA REPAIR  04/2017   V[facility]  . INSERTION OF MESH N/A 04/28/2017   Procedure: POSSIBLE INSERTION OF MESH;  Surgeon: Coralie Keens, MD;  Location: [facility];  Service: General;  Laterality: N/A;  . JOINT REPLACEMENT    . LAPAROSCOPIC APPENDECTOMY N/A 02/07/2016   Procedure: LAPAROSCOPIC APPENDECTOMY;  Surgeon: Coralie Keens, MD;  Location: [facility];  Service: General;  Laterality: N/A;  . ROBOTIC ASSISTED TOTAL HYSTERECTOMY WITH BILATERAL SALPINGO OOPHERECTOMY  01/23/2014   uterine cancer  . ROUX-EN-Y GASTRIC BYPASS  11/2016   Cherrie Gauze  . SHOULDER ARTHROSCOPY WITH DEBRIDEMENT AND BICEP TENDON REPAIR Left 09/11/2014   Procedure: SHOULDER ARTHROSCOPY WITH DEBRIDEMENT AND BICEP TENDON REPAIR;  Surgeon: Renette Butters, MD;  Location: [facility];   Service: Orthopedics;  Laterality: Left;  . SHOULDER ARTHROSCOPY WITH DISTAL CLAVICLE RESECTION Left 09/11/2014   Procedure: SHOULDER ARTHROSCOPY WITH DISTAL CLAVICLE RESECTION;  Surgeon: Renette Butters, MD;  Location: [facility];  Service: Orthopedics;  Laterality: Left;  . SHOULDER ARTHROSCOPY WITH SUBACROMIAL DECOMPRESSION Left 09/11/2014   Procedure: SHOULDER ARTHROSCOPY WITH SUBACROMIAL DECOMPRESSION;  Surgeon: Renette Butters, MD;  Location: [facility];  Service: Orthopedics;  Laterality: Left;  . TONSILLECTOMY    . TOTAL KNEE ARTHROPLASTY Right 07/06/2017  . TOTAL KNEE ARTHROPLASTY Right 07/06/2017   Procedure: RIGHT TOTAL KNEE ARTHROPLASTY;  Surgeon: Renette Butters, MD;  Location: [facility];  Service: Orthopedics;  Laterality: Right;  . VENTRAL HERNIA REPAIR N/A 04/28/2017   Procedure: HERNIA REPAIR VENTRAL ADULT;  Surgeon: Coralie Keens, MD;  Location: [facility];  Service: General;  Laterality: N/A;   Allergies  Allergen Reactions  . Bee Venom Swelling and Other (See Comments)    Patient is allergic to bee venom, mosquitos, wasps, yellow jackets and bees. Wherever she gets stung that area swells, turns hot and has fevers.    . Other Anaphylaxis and Shortness Of Breath    Hickory smoke  . Penicillins Other (See Comments)    PATIENT HAS HAD A PCN REACTION WITH IMMEDIATE RASH, FACIAL/TONGUE/THROAT SWELLING, SOB, OR LIGHTHEADEDNESS WITH HYPOTENSION:  #  #  #  YES  #  #  #   Has patient had a PCN reaction causing severe rash involving mucus membranes or skin necrosis: NO Has patient had a PCN reaction that required hospitalization NO Has patient had a PCN reaction occurring within the last 10 years: NO If all of the above answers are "NO", then may proceed with Cephalosporin use.  . Food Other (See Comments)    TANGERINES UNSPECIFIED CHILDHOOD REACTION   . Tangerine Flavor     UNSPECIFIED REACTION   . Latex Rash    Latex rash due to elastic in zip-up TED hose at home (local rash), pt. Has  had flu vaccine before without problems.  . Morphine And Related Itching   Prior to Admission medications   Medication Sig Start Date End Date Taking? Authorizing Provider  amLODipine (NORVASC) 5 MG tablet Take 5 mg by mouth at bedtime.    Yes [provider]  Calcium Carbonate-Vitamin D (CALTRATE 600+D PO) Take 1 tablet by mouth daily.   Yes [provider]  Cholecalciferol (VITAMIN D3) 5000 units TABS Take 5,000 Units by mouth daily.  12/03/15  Yes [provider]  fluticasone (CUTIVATE) 0.05 % cream Apply 1 application topically daily as needed (applied to dry spots on face).   Yes [provider]  levothyroxine (SYNTHROID, LEVOTHROID) 50 MCG tablet Take 50 mcg by mouth daily before breakfast.    Yes [provider]  Multiple Vitamin (MULTIVITAMIN WITH MINERALS) TABS tablet Take 1 tablet by mouth 2 (two) times daily.   Yes [provider]  omeprazole (PRILOSEC) 40 MG capsule Take 40 mg by mouth at bedtime.    Yes [provider]  protein supplement  shake (PREMIER PROTEIN) LIQD Take 11 oz by mouth daily as needed (for meal replacement). Chocolate   Yes [provider]  pseudoephedrine (SUDAFED) 30 MG tablet Take 30 mg by mouth every 6 (six) hours as needed (for allergies/congestion).    Yes [provider]  ranitidine (ZANTAC) 150 MG tablet Take 150 mg by mouth daily.    Yes [provider]  ursodiol (ACTIGALL) 250 MG tablet Take 250 mg by mouth 2 (two) times daily. 03/26/17  Yes [provider]   Social History   Socioeconomic History  . Marital status: Divorced    Spouse name: Not on file  . Number of children: Not on file  . Years of education: Not on file  . Highest education level: Not on file  Occupational History  . Not on file  Social Needs  . Financial resource strain: Not on file  . Food insecurity:    Worry: Not on file    Inability: Not on file  . Transportation needs:     Medical: Not on file    Non-medical: Not on file  Tobacco Use  . Smoking status: Never Smoker  . Smokeless tobacco: Former Systems developer    Types: Chew  . Tobacco comment: "chewed as a teen"  Substance and Sexual Activity  . Alcohol use: Yes    Comment: 07/07/2017 "quit drinking by age 89; social drinker only"  . Drug use: No  . Sexual activity: Yes    Birth control/protection: None  Lifestyle  . Physical activity:    Days per week: Not on file    Minutes per session: Not on file  . Stress: Not on file  Relationships  . Social connections:    Talks on phone: Not on file    Gets together: Not on file    Attends religious service: Not on file    Active member of club or organization: Not on file    Attends meetings of clubs or organizations: Not on file    Relationship status: Not on file  Other Topics Concern  . Not on file  Social History Narrative  . Not on file   Family History  Problem Relation Age of Onset  . Transient ischemic attack Mother   . Heart attack Father   . Diabetes type II Other   . Lung cancer Maternal Grandmother     ROS: Currently denies lightheadedness, dizziness, Fever, chills, CP, SOB.   No personal history of MI, or CVA. No loose teeth or dentures All other systems have been reviewed and were otherwise currently negative with the exception of those mentioned in the HPI and as above.  Objective: Vitals: Ht: 5 feet 4 inches wt: 161 temp: 97.6 BP: 137/86 pulse: 72 O2 97 % on room air.   Physical Exam: General: Alert, NAD.  Antalgic Gait  HEENT: EOMI, Good Neck Extension  Pulm: No increased work of breathing.  Clear B/L A/P w/o crackle or wheeze.  CV: RRR, No m/g/r appreciated  GI: soft, NT, ND Neuro: Neuro without gross focal deficit.  Sensation intact distally Skin: No lesions in the area of chief complaint MSK/Surgical Site: Left knee w/o redness or effusion.  Mainly medial JLT. ROM 2-115.  5/5 strength in extension and flexion.  +EHL/FHL.  NVI.   Stable varus and valgus stress.    Imaging Review Plain radiographs demonstrate severe degenerative joint disease of the left knee.   Preoperative templating of the joint replacement has been completed, documented, and submitted to  the Operating Room personnel in order to optimize intra-operative equipment management.  Assessment: OA LEFT KNEE Principal Problem:   Primary osteoarthritis of left knee Active Problems:   Thyroid activity decreased   Left shoulder pain   GERD (gastroesophageal reflux disease)   History of DVT (deep vein thrombosis)   History of pulmonary embolus (PE)   Plan: Plan for Procedure(s): LEFT TOTAL KNEE ARTHROPLASTY  The patient history, physical exam, clinical judgement of the provider and imaging are consistent with end stage degenerative joint disease and total joint arthroplasty is deemed medically necessary. The treatment options including medical management, injection therapy, and arthroplasty were discussed at length. The risks and benefits of Procedure(s): LEFT TOTAL KNEE ARTHROPLASTY were presented and reviewed.  The risks of nonoperative treatment, versus surgical intervention including but not limited to continued pain, aseptic loosening, stiffness, dislocation/subluxation, infection, bleeding, nerve injury, blood clots, cardiopulmonary complications, morbidity, mortality, among others were discussed. The patient verbalizes understanding and wishes to proceed with the plan.  Patient is being admitted for inpatient treatment for surgery, pain control, PT, OT, prophylactic antibiotics, VTE prophylaxis, progressive ambulation, ADL's and discharge planning.   Dental prophylaxis discussed and recommended for 2 years postoperatively.   The patient does not meet the criteria for TXA dt DVT/PE history.  Xarelto will be used postoperatively for DVT prophylaxis in addition to SCDs, and early ambulation.  History of DVT/PE and gastric bypass.  Her  postoperative prescriptions were provided after her pre-op visit.  PCN allergy as a child reported as life-treating / affected breathing  The patient is planning to be discharged home with home health services [facility] home care) in care of her fianc.   Patient's anticipated LOS is less than 2 midnights, meeting these requirements: - Younger than 72 - Lives within 1 hour of care - Has a competent adult at home to recover with post-op recover - NO history of  - Chronic pain requiring opiods  - Diabetes  - Coronary Artery Disease  - Heart failure  - Heart attack  - Stroke  - Cardiac arrhythmia  - Respiratory Failure/COPD  - Renal failure  - Anemia  - Advanced Liver disease  Prudencio Burly III, PA-C 04/04/2018 5:18 PM

## 2018-04-06 NOTE — Pre-Procedure Instructions (Signed)
Erin Vasquez  04/06/2018      [place], [place] - [address] Erin Vas[place] [address] Phone: [phone] Fax: [phone]    Your procedure is scheduled on August 6  Report to [facility] Admitting at 0800 A.M.  Call this number if you have problems the morning of surgery:  [phone]   Remember:  Do not eat or drink after midnight.    Take these medicines the morning of surgery with A SIP OF WATER  levothyroxine (SYNTHROID, LEVOTHROID ranitidine (ZANTAC)  ursodiol (ACTIGALL)  7 days prior to surgery STOP taking any Aspirin(unless otherwise instructed by your surgeon), Aleve, Naproxen, Ibuprofen, Motrin, Advil, Goody's, BC's, all herbal medications, fish oil, and all vitamins     Do not wear jewelry, make-up or nail polish.  Do not wear lotions, powders, or perfumes, or deodorant.  Do not shave 48 hours prior to surgery.  Men may shave face and neck.  Do not bring valuables to the hospital.  [facility] is not responsible for any belongings or valuables.  Contacts, dentures or bridgework may not be worn into surgery.  Leave your suitcase in the car.  After surgery it may be brought to your room.  For patients admitted to the hospital, discharge time will be determined by your treatment team.  Patients discharged the day of surgery will not be allowed to drive home.    Special instructions:   [facility]- Preparing For Surgery  Before surgery, you can play an important role. Because skin is not sterile, your skin needs to be as free of germs as possible. You can reduce the number of germs on your skin by washing with CHG (chlorahexidine gluconate) Soap before surgery.  CHG is an antiseptic cleaner which kills germs and bonds with the skin to continue killing germs even after washing.    Oral Hygiene is also important to reduce your risk of infection.  Remember - BRUSH YOUR TEETH THE MORNING OF  SURGERY WITH YOUR REGULAR TOOTHPASTE  Please do not use if you have an allergy to CHG or antibacterial soaps. If your skin becomes reddened/irritated stop using the CHG.  Do not shave (including legs and underarms) for at least 48 hours prior to first CHG shower. It is OK to shave your face.  Please follow these instructions carefully.   1. Shower the NIGHT BEFORE SURGERY and the MORNING OF SURGERY with CHG.   2. If you chose to wash your hair, wash your hair first as usual with your normal shampoo.  3. After you shampoo, rinse your hair and body thoroughly to remove the shampoo.  4. Use CHG as you would any other liquid soap. You can apply CHG directly to the skin and wash gently with a scrungie or a clean washcloth.   5. Apply the CHG Soap to your body ONLY FROM THE NECK DOWN.  Do not use on open wounds or open sores. Avoid contact with your eyes, ears, mouth and genitals (private parts). Wash Face and genitals (private parts)  with your normal soap.  6. Wash thoroughly, paying special attention to the area where your surgery will be performed.  7. Thoroughly rinse your body with warm water from the neck down.  8. DO NOT shower/wash with your normal soap after using and rinsing off the CHG Soap.  9. Pat yourself dry with a CLEAN TOWEL.  10. Wear CLEAN PAJAMAS to bed the night  before surgery, wear comfortable clothes the morning of surgery  11. Place CLEAN SHEETS on your bed the night of your first shower and DO NOT SLEEP WITH PETS.    Day of Surgery:  Do not apply any deodorants/lotions.  Please wear clean clothes to the hospital/surgery center.   Remember to brush your teeth WITH YOUR REGULAR TOOTHPASTE.    Please read over the following fact sheets that you were given.

## 2018-04-07 ENCOUNTER — Encounter (HOSPITAL_COMMUNITY): Payer: Self-pay

## 2018-04-07 ENCOUNTER — Encounter (HOSPITAL_COMMUNITY)
Admission: RE | Admit: 2018-04-07 | Discharge: 2018-04-07 | Disposition: A | Discharge status: Home / Self Care | Payer: Medicare HMO | Source: Ambulatory Visit | Attending: Orthopedic Surgery | Admitting: Orthopedic Surgery

## 2018-04-07 DIAGNOSIS — Z01812 Encounter for preprocedural laboratory examination: Secondary | ICD-10-CM | POA: Insufficient documentation

## 2018-04-07 HISTORY — DX: Anxiety disorder, unspecified: F41.9

## 2018-04-07 LAB — CBC
HCT: 41.3 % (ref 36.0–46.0)
Hemoglobin: 13.4 g/dL (ref 12.0–15.0)
MCH: 31 pg (ref 26.0–34.0)
MCHC: 32.4 g/dL (ref 30.0–36.0)
MCV: 95.6 fL (ref 78.0–100.0)
PLATELETS: 233 10*3/uL (ref 150–400)
RBC: 4.32 MIL/uL (ref 3.87–5.11)
RDW: 12.8 % (ref 11.5–15.5)
WBC: 5.1 10*3/uL (ref 4.0–10.5)

## 2018-04-07 LAB — BASIC METABOLIC PANEL
Anion gap: 9 (ref 5–15)
BUN: 14 mg/dL (ref 8–23)
CALCIUM: 9.2 mg/dL (ref 8.9–10.3)
CO2: 26 mmol/L (ref 22–32)
CREATININE: 0.86 mg/dL (ref 0.44–1.00)
Chloride: 109 mmol/L (ref 98–111)
GFR calc non Af Amer: >60 mL/min (ref >60)
Glucose, Bld: 84 mg/dL (ref 70–99)
Potassium: 3.4 mmol/L — ABNORMAL LOW (ref 3.5–5.1)
Sodium: 144 mmol/L (ref 135–145)

## 2018-04-07 LAB — SURGICAL PCR SCREEN
MRSA, PCR: NEGATIVE
STAPHYLOCOCCUS AUREUS: NEGATIVE

## 2018-04-13 ENCOUNTER — Ambulatory Visit
Admission: RE | Admit: 2018-04-13 | Discharge: 2018-04-13 | Disposition: A | Discharge status: Home / Self Care | Payer: Medicare HMO | Source: Ambulatory Visit | Attending: Family Medicine | Admitting: Family Medicine

## 2018-04-13 DIAGNOSIS — Z1231 Encounter for screening mammogram for malignant neoplasm of breast: Secondary | ICD-10-CM

## 2018-04-14 ENCOUNTER — Other Ambulatory Visit: Payer: Self-pay | Admitting: Orthopedic Surgery

## 2018-04-14 NOTE — Care Plan (Signed)
Spoke with patient. Known to me from prior surgery. Will discharge to home with following arrangements.   DME: [facility] delivered to home, has other equipment  HHPT: [facility]: [facility] 05/04/18 @ 300  MD follow up: 05/04/18 @ 415   Please contact [place], [place] [phone] with questions or if this plan should need to change.  Thanks

## 2018-04-15 ENCOUNTER — Inpatient Hospital Stay: Payer: Medicare HMO | Attending: Oncology | Admitting: Oncology

## 2018-04-15 ENCOUNTER — Telehealth: Payer: Self-pay

## 2018-04-15 VITALS — BP 142/87 | HR 51 | Temp 97.8°F | Resp 20 | Ht 64.0 in | Wt 164.0 lb

## 2018-04-15 DIAGNOSIS — I82402 Acute embolism and thrombosis of unspecified deep veins of left lower extremity: Secondary | ICD-10-CM | POA: Diagnosis present

## 2018-04-15 DIAGNOSIS — R609 Edema, unspecified: Secondary | ICD-10-CM | POA: Insufficient documentation

## 2018-04-15 DIAGNOSIS — I2699 Other pulmonary embolism without acute cor pulmonale: Secondary | ICD-10-CM | POA: Diagnosis present

## 2018-04-15 NOTE — Progress Notes (Signed)
Hematology and Oncology Follow Up Visit  Erin Vasquez [id number] 1956-03-02 62 y.o. 04/15/2018 1:08 PM Erin Vasquez, MDBriscoe, Erin Rodney, MD   Principle Diagnosis: 62 year old with left lower extremity DVT and pulmonary emboli diagnosed in February 2017.  Due to immobility and obesity following surgery.  Prior therapy: Xarelto 20 mg daily since February 2017. She completed 6 months of therapy and discontinued after that.  Current therapy: Active surveillance.   Interim History: Erin Vasquez is here for a follow-up visit.  Since the last visit, she reports no major changes or complications.  She underwent right knee replacement last year without any postoperative complications.  She also underwent bariatric surgery for weight reduction without any postoperative issues.  She remains active and attends to activities of daily living.  She did report increased swelling in her leg which is chronic since her last deep vein thrombosis.  She denied any headaches blurred vision, syncope or seizures.  She denies any alteration mental status or dizziness.  She denied any fevers, chills, sweats or weight loss. She denies any chest pain, difficulty breathing, cough or wheezing. She denied any hemoptysis or hematemesis. She does not report any nausea, vomiting or abdominal pain. She does not report any constipation or diarrhea. She does not report any frequency urgency or hesitancy.  Denies any skin rashes or lesions.  Remaining review of systems is negative.  Medications: I have reviewed the patient's current medications.  Current Outpatient Medications  Medication Sig Dispense Refill  . amLODipine (NORVASC) 5 MG tablet Take 5 mg by mouth at bedtime.     . Calcium Carbonate-Vitamin D (CALTRATE 600+D PO) Take 1 tablet by mouth daily.    . Cholecalciferol (VITAMIN D3) 5000 units TABS Take 5,000 Units by mouth daily.     . fluticasone (CUTIVATE) 0.05 % cream Apply 1 application topically daily as needed (applied  to dry spots on face).    Marland Kitchen levothyroxine (SYNTHROID, LEVOTHROID) 50 MCG tablet Take 50 mcg by mouth daily before breakfast.     . Multiple Vitamin (MULTIVITAMIN WITH MINERALS) TABS tablet Take 1 tablet by mouth 2 (two) times daily.    Marland Kitchen omeprazole (PRILOSEC) 40 MG capsule Take 40 mg by mouth at bedtime.     . protein supplement shake (PREMIER PROTEIN) LIQD Take 11 oz by mouth daily as needed (for meal replacement). Chocolate    . pseudoephedrine (SUDAFED) 30 MG tablet Take 30 mg by mouth every 6 (six) hours as needed (for allergies/congestion).     . ranitidine (ZANTAC) 150 MG tablet Take 150 mg by mouth daily.     . ursodiol (ACTIGALL) 250 MG tablet Take 250 mg by mouth 2 (two) times daily.  2   No current facility-administered medications for this visit.      Allergies:  Allergies  Allergen Reactions  . Bee Venom Swelling and Other (See Comments)    Patient is allergic to bee venom, mosquitos, wasps, yellow jackets and bees. Wherever she gets stung that area swells, turns hot and has fevers.    . Other Anaphylaxis and Shortness Of Breath    Hickory smoke  . Penicillins Other (See Comments)    PATIENT HAS HAD A PCN REACTION WITH IMMEDIATE RASH, FACIAL/TONGUE/THROAT SWELLING, SOB, OR LIGHTHEADEDNESS WITH HYPOTENSION:  #  #  #  YES  #  #  #   Has patient had a PCN reaction causing severe rash involving mucus membranes or skin necrosis: NO Has patient had a PCN reaction that required  hospitalization NO Has patient had a PCN reaction occurring within the last 10 years: NO If all of the above answers are "NO", then may proceed with Cephalosporin use.  . Food Other (See Comments)    TANGERINES UNSPECIFIED CHILDHOOD REACTION   . Tangerine Flavor     UNSPECIFIED REACTION   . Latex Rash    Latex rash due to elastic in zip-up TED hose at home (local rash), pt. Has had flu vaccine before without problems.  . Morphine And Related Itching    Past Medical History, Surgical history, Social  history, and Family History were reviewed and updated.  Physical Exam: Blood pressure (!) 142/87, pulse (!) 51, temperature 97.8 F (36.6 C), temperature source Oral, resp. rate 20, height 5\' 4"  (1.626 m), weight 164 lb (74.4 kg), SpO2 99 %. ECOG: 1 General appearance: Well-appearing woman without distress. Head: Atraumatic without normalities. Oropharynx: Without any thrush or ulcers. Lymph nodes: No lymphadenopathy in the cervical, supraclavicular, or axillary lymph node. Heart:regular rate and rhythm, S1, S2.  1+ edema noted in her left ankle. Lung: Clear to auscultation without any rhonchi, wheezes or dullness to percussion. Abdomin: Soft, nontender with any rebound or guarding. Muscularskeletal: No joint deformity or effusion. Skin: No rashes or lesions.   Lab Results: Lab Results  Component Value Date   WBC 5.1 04/07/2018   HGB 13.4 04/07/2018   HCT 41.3 04/07/2018   MCV 95.6 04/07/2018   PLT 233 04/07/2018     Chemistry      Component Value Date/Time   NA 144 04/07/2018 0957   K 3.4 (L) 04/07/2018 0957   CL 109 04/07/2018 0957   CO2 26 04/07/2018 0957   BUN 14 04/07/2018 0957   CREATININE 0.86 04/07/2018 0957      Component Value Date/Time   CALCIUM 9.2 04/07/2018 0957   ALKPHOS 105 02/06/2016 1223   AST 27 02/06/2016 1223   ALT 36 02/06/2016 1223   BILITOT 0.5 02/06/2016 1223       Impression and Plan:  62 year old woman with the following issues:  1. Left lower extremity DVT with PE diagnosed in 2017.  This was provoked because of immobility after surgery as well as obesity.  She finished 6 months of anticoagulation with Xarelto without any recurrent thrombosis.  The natural course of acquired thrombophilia and risk of develop further thrombosis was discussed today.  Since her deep vein thrombosis, she underwent knee replacement surgery and weight reduction surgery without any issues.  I doubt there is an inherited thrombophilia associated with her deep  vein thrombosis.  She is scheduled to have another knee replaced next month and I see no contraindication to proceeding.  I did recommend proceeding with Xarelto for short period of time which is scheduled to take place in any case.   2.  Left lower extremity edema: This is related to her previous deep vein thrombosis.  A repeat ultrasound performed recently did not show any new blood clots.  I reassured her that this is likely related to chronic changes from her previous deep vein thrombosis.  We have discussed strategies to improve this with leg elevation as well as compression stocking.   3. Follow-up: Happy to see her in the future as needed.  15  minutes was spent with the patient face-to-face today.  More than 50% of time was dedicated to discussing the natural course of thrombosis, risk factors and treatment options in the future.    Zola Button, MD 8/2/20191:08 PM

## 2018-04-15 NOTE — Telephone Encounter (Signed)
Per 8/2 no los 

## 2018-04-18 ENCOUNTER — Encounter (HOSPITAL_COMMUNITY): Payer: Self-pay | Admitting: Anesthesiology

## 2018-04-18 MED ORDER — LACTATED RINGERS IV SOLN
INTRAVENOUS | Status: DC
  Administered 2018-04-19: 06:00:00 via INTRAVENOUS

## 2018-04-18 MED ORDER — VANCOMYCIN HCL IN DEXTROSE 1-5 GM/200ML-% IV SOLN
1000.0000 mg | INTRAVENOUS | Status: AC
  Administered 2018-04-19: 1000 mg via INTRAVENOUS
  Filled 2018-04-18: qty 200

## 2018-04-18 MED ORDER — GABAPENTIN 300 MG PO CAPS
300.0000 mg | ORAL_CAPSULE | Freq: Once | ORAL | Status: AC
  Administered 2018-04-19: 300 mg via ORAL
  Filled 2018-04-18: qty 1

## 2018-04-18 MED ORDER — ACETAMINOPHEN 500 MG PO TABS
1000.0000 mg | ORAL_TABLET | Freq: Once | ORAL | Status: AC
  Administered 2018-04-19: 1000 mg via ORAL
  Filled 2018-04-18: qty 2

## 2018-04-18 NOTE — Anesthesia Preprocedure Evaluation (Addendum)
Anesthesia Evaluation  Patient identified by MRN, date of birth, ID band Patient awake    Reviewed: Allergy & Precautions, NPO status , Patient's Chart, lab work & pertinent test results  Airway Mallampati: II  TM Distance: >3 FB Neck ROM: Full    Dental no notable dental hx. (+) Teeth Intact   Pulmonary asthma , sleep apnea ,  Not currently using CPAP since weight loss   Pulmonary exam normal breath sounds clear to auscultation       Cardiovascular hypertension, Pt. on medications + Peripheral Vascular Disease  Normal cardiovascular exam Rhythm:Regular Rate:Normal  Hx/o DVT and bilateral PTE   Neuro/Psych Anxiety Hx/o left thigh numbness  Neuromuscular disease    GI/Hepatic Neg liver ROS, hiatal hernia, GERD  Medicated and Controlled,S/P Gastric bypass   Endo/Other  Hypothyroidism   Renal/GU Renal diseaseHx/o AKI     Musculoskeletal  (+) Arthritis , Osteoarthritis,  OA left knee   Abdominal   Peds  Hematology  (+) anemia , Hx/o lupus anticoagulant   Anesthesia Other Findings   Reproductive/Obstetrics                            Anesthesia Physical Anesthesia Plan  ASA: III  Anesthesia Plan: Spinal   Post-op Pain Management:  Regional for Post-op pain   Induction:   PONV Risk Score and Plan: 3 and Propofol infusion, Ondansetron and Dexamethasone  Airway Management Planned: Natural Airway, Nasal Cannula and Simple Face Mask  Additional Equipment:   Intra-op Plan:   Post-operative Plan:   Informed Consent: I have reviewed the patients History and Physical, chart, labs and discussed the procedure including the risks, benefits and alternatives for the proposed anesthesia with the patient or authorized representative who has indicated his/her understanding and acceptance.   Dental advisory given  Plan Discussed with: CRNA and Surgeon  Anesthesia Plan Comments:         Anesthesia Quick Evaluation

## 2018-04-19 ENCOUNTER — Encounter (HOSPITAL_COMMUNITY)
Admission: RE | Disposition: A | Discharge status: Home / Self Care | Payer: Self-pay | Source: Ambulatory Visit | Attending: Orthopedic Surgery

## 2018-04-19 ENCOUNTER — Inpatient Hospital Stay (HOSPITAL_COMMUNITY)
Admission: RE | Admit: 2018-04-19 | Discharge: 2018-04-20 | DRG: 470 | Disposition: A | Discharge status: Home / Self Care | Payer: Medicare HMO | Source: Ambulatory Visit | Attending: Orthopedic Surgery | Admitting: Orthopedic Surgery

## 2018-04-19 ENCOUNTER — Inpatient Hospital Stay (HOSPITAL_COMMUNITY): Payer: Medicare HMO | Admitting: Anesthesiology

## 2018-04-19 ENCOUNTER — Inpatient Hospital Stay (HOSPITAL_COMMUNITY): Payer: Medicare HMO

## 2018-04-19 ENCOUNTER — Encounter (HOSPITAL_COMMUNITY): Payer: Self-pay

## 2018-04-19 ENCOUNTER — Other Ambulatory Visit: Payer: Self-pay

## 2018-04-19 DIAGNOSIS — M1710 Unilateral primary osteoarthritis, unspecified knee: Secondary | ICD-10-CM | POA: Diagnosis present

## 2018-04-19 DIAGNOSIS — Z91040 Latex allergy status: Secondary | ICD-10-CM

## 2018-04-19 DIAGNOSIS — K449 Diaphragmatic hernia without obstruction or gangrene: Secondary | ICD-10-CM | POA: Diagnosis present

## 2018-04-19 DIAGNOSIS — M1712 Unilateral primary osteoarthritis, left knee: Principal | ICD-10-CM | POA: Diagnosis present

## 2018-04-19 DIAGNOSIS — Z9109 Other allergy status, other than to drugs and biological substances: Secondary | ICD-10-CM | POA: Diagnosis not present

## 2018-04-19 DIAGNOSIS — E559 Vitamin D deficiency, unspecified: Secondary | ICD-10-CM | POA: Diagnosis present

## 2018-04-19 DIAGNOSIS — E039 Hypothyroidism, unspecified: Secondary | ICD-10-CM | POA: Diagnosis present

## 2018-04-19 DIAGNOSIS — Z86718 Personal history of other venous thrombosis and embolism: Secondary | ICD-10-CM

## 2018-04-19 DIAGNOSIS — Z90710 Acquired absence of both cervix and uterus: Secondary | ICD-10-CM

## 2018-04-19 DIAGNOSIS — Z7951 Long term (current) use of inhaled steroids: Secondary | ICD-10-CM | POA: Diagnosis not present

## 2018-04-19 DIAGNOSIS — Z91018 Allergy to other foods: Secondary | ICD-10-CM | POA: Diagnosis not present

## 2018-04-19 DIAGNOSIS — G8929 Other chronic pain: Secondary | ICD-10-CM | POA: Diagnosis present

## 2018-04-19 DIAGNOSIS — Z79890 Hormone replacement therapy (postmenopausal): Secondary | ICD-10-CM | POA: Diagnosis not present

## 2018-04-19 DIAGNOSIS — Z885 Allergy status to narcotic agent status: Secondary | ICD-10-CM | POA: Diagnosis not present

## 2018-04-19 DIAGNOSIS — Z9884 Bariatric surgery status: Secondary | ICD-10-CM

## 2018-04-19 DIAGNOSIS — Z8542 Personal history of malignant neoplasm of other parts of uterus: Secondary | ICD-10-CM | POA: Diagnosis not present

## 2018-04-19 DIAGNOSIS — Z91030 Bee allergy status: Secondary | ICD-10-CM | POA: Diagnosis not present

## 2018-04-19 DIAGNOSIS — E038 Other specified hypothyroidism: Secondary | ICD-10-CM

## 2018-04-19 DIAGNOSIS — M1990 Unspecified osteoarthritis, unspecified site: Secondary | ICD-10-CM | POA: Diagnosis present

## 2018-04-19 DIAGNOSIS — Z96651 Presence of right artificial knee joint: Secondary | ICD-10-CM | POA: Diagnosis present

## 2018-04-19 DIAGNOSIS — Z96659 Presence of unspecified artificial knee joint: Secondary | ICD-10-CM

## 2018-04-19 DIAGNOSIS — K219 Gastro-esophageal reflux disease without esophagitis: Secondary | ICD-10-CM | POA: Diagnosis present

## 2018-04-19 DIAGNOSIS — D6862 Lupus anticoagulant syndrome: Secondary | ICD-10-CM | POA: Diagnosis present

## 2018-04-19 DIAGNOSIS — M549 Dorsalgia, unspecified: Secondary | ICD-10-CM | POA: Diagnosis present

## 2018-04-19 DIAGNOSIS — I10 Essential (primary) hypertension: Secondary | ICD-10-CM | POA: Diagnosis present

## 2018-04-19 DIAGNOSIS — Z880 Allergy status to penicillin: Secondary | ICD-10-CM | POA: Diagnosis not present

## 2018-04-19 DIAGNOSIS — Z86711 Personal history of pulmonary embolism: Secondary | ICD-10-CM | POA: Diagnosis present

## 2018-04-19 DIAGNOSIS — M25512 Pain in left shoulder: Secondary | ICD-10-CM | POA: Diagnosis present

## 2018-04-19 DIAGNOSIS — M25562 Pain in left knee: Secondary | ICD-10-CM | POA: Diagnosis present

## 2018-04-19 HISTORY — PX: TOTAL KNEE ARTHROPLASTY: SHX125

## 2018-04-19 SURGERY — ARTHROPLASTY, KNEE, TOTAL
Anesthesia: Spinal | Site: Knee | Laterality: Left

## 2018-04-19 MED ORDER — LEVOTHYROXINE SODIUM 50 MCG PO TABS
50.0000 ug | ORAL_TABLET | Freq: Every day | ORAL | Status: DC
  Administered 2018-04-20: 50 ug via ORAL
  Filled 2018-04-19: qty 1

## 2018-04-19 MED ORDER — TRANEXAMIC ACID 1000 MG/10ML IV SOLN
1000.0000 mg | INTRAVENOUS | Status: AC
  Administered 2018-04-19: 1000 mg via INTRAVENOUS
  Filled 2018-04-19: qty 1100

## 2018-04-19 MED ORDER — HYDROMORPHONE HCL 1 MG/ML IJ SOLN: 0.5000 mg | INTRAMUSCULAR | Status: DC | PRN

## 2018-04-19 MED ORDER — CHLORHEXIDINE GLUCONATE 4 % EX LIQD: 60.0000 mL | Freq: Once | CUTANEOUS | Status: DC

## 2018-04-19 MED ORDER — URSODIOL 300 MG PO CAPS
300.0000 mg | ORAL_CAPSULE | Freq: Two times a day (BID) | ORAL | Status: DC
  Administered 2018-04-19 – 2018-04-20 (×2): 300 mg via ORAL
  Filled 2018-04-19 (×3): qty 1

## 2018-04-19 MED ORDER — FENTANYL CITRATE (PF) 100 MCG/2ML IJ SOLN
INTRAMUSCULAR | Status: AC
  Filled 2018-04-19: qty 2

## 2018-04-19 MED ORDER — ONDANSETRON HCL 4 MG/2ML IJ SOLN
4.0000 mg | Freq: Four times a day (QID) | INTRAMUSCULAR | Status: DC | PRN
  Administered 2018-04-19: 4 mg via INTRAVENOUS
  Filled 2018-04-19: qty 2

## 2018-04-19 MED ORDER — AMLODIPINE BESYLATE 5 MG PO TABS
5.0000 mg | ORAL_TABLET | Freq: Every day | ORAL | Status: DC
  Administered 2018-04-19: 5 mg via ORAL
  Filled 2018-04-19 (×2): qty 1

## 2018-04-19 MED ORDER — METOCLOPRAMIDE HCL 5 MG/ML IJ SOLN: 5.0000 mg | Freq: Three times a day (TID) | INTRAMUSCULAR | Status: DC | PRN

## 2018-04-19 MED ORDER — TRANEXAMIC ACID 1000 MG/10ML IV SOLN
1000.0000 mg | INTRAVENOUS | Status: DC
  Filled 2018-04-19 (×2): qty 10

## 2018-04-19 MED ORDER — 0.9 % SODIUM CHLORIDE (POUR BTL) OPTIME
TOPICAL | Status: DC | PRN
  Administered 2018-04-19: 1000 mL

## 2018-04-19 MED ORDER — RIVAROXABAN 10 MG PO TABS
10.0000 mg | ORAL_TABLET | Freq: Every day | ORAL | Status: DC
  Administered 2018-04-20: 10 mg via ORAL
  Filled 2018-04-19: qty 1

## 2018-04-19 MED ORDER — KETOROLAC TROMETHAMINE 15 MG/ML IJ SOLN
7.5000 mg | Freq: Four times a day (QID) | INTRAMUSCULAR | Status: AC
  Administered 2018-04-19 – 2018-04-20 (×4): 7.5 mg via INTRAVENOUS
  Filled 2018-04-19 (×3): qty 1

## 2018-04-19 MED ORDER — MEPERIDINE HCL 50 MG/ML IJ SOLN: 6.2500 mg | INTRAMUSCULAR | Status: DC | PRN

## 2018-04-19 MED ORDER — PHENOL 1.4 % MT LIQD: 1.0000 | OROMUCOSAL | Status: DC | PRN

## 2018-04-19 MED ORDER — MIDAZOLAM HCL 5 MG/5ML IJ SOLN
INTRAMUSCULAR | Status: DC | PRN
  Administered 2018-04-19: 2 mg via INTRAVENOUS

## 2018-04-19 MED ORDER — KETOROLAC TROMETHAMINE 15 MG/ML IJ SOLN
INTRAMUSCULAR | Status: AC
  Filled 2018-04-19: qty 1

## 2018-04-19 MED ORDER — ACETAMINOPHEN 325 MG PO TABS: 325.0000 mg | ORAL_TABLET | Freq: Four times a day (QID) | ORAL | Status: DC | PRN

## 2018-04-19 MED ORDER — ADULT MULTIVITAMIN W/MINERALS CH
1.0000 | ORAL_TABLET | Freq: Two times a day (BID) | ORAL | Status: DC
  Administered 2018-04-19 – 2018-04-20 (×2): 1 via ORAL
  Filled 2018-04-19 (×2): qty 1

## 2018-04-19 MED ORDER — ONDANSETRON HCL 4 MG/2ML IJ SOLN
INTRAMUSCULAR | Status: DC | PRN
  Administered 2018-04-19: 4 mg via INTRAVENOUS

## 2018-04-19 MED ORDER — BUPIVACAINE LIPOSOME 1.3 % IJ SUSP
20.0000 mL | INTRAMUSCULAR | Status: AC
  Administered 2018-04-19: 20 mL
  Filled 2018-04-19: qty 20

## 2018-04-19 MED ORDER — PANTOPRAZOLE SODIUM 40 MG PO TBEC
40.0000 mg | DELAYED_RELEASE_TABLET | Freq: Every day | ORAL | Status: DC
  Administered 2018-04-19 – 2018-04-20 (×2): 40 mg via ORAL
  Filled 2018-04-19 (×3): qty 1

## 2018-04-19 MED ORDER — SODIUM CHLORIDE 0.9 % IR SOLN
Status: DC | PRN
  Administered 2018-04-19: 3000 mL

## 2018-04-19 MED ORDER — PROPOFOL 10 MG/ML IV BOLUS
INTRAVENOUS | Status: AC
  Filled 2018-04-19: qty 20

## 2018-04-19 MED ORDER — SODIUM CHLORIDE 0.9 % IV SOLN
INTRAVENOUS | Status: DC | PRN
  Administered 2018-04-19: 20 ug/min via INTRAVENOUS

## 2018-04-19 MED ORDER — FENTANYL CITRATE (PF) 100 MCG/2ML IJ SOLN
25.0000 ug | INTRAMUSCULAR | Status: DC | PRN
  Administered 2018-04-19 (×4): 25 ug via INTRAVENOUS

## 2018-04-19 MED ORDER — DEXAMETHASONE SODIUM PHOSPHATE 10 MG/ML IJ SOLN: 10.0000 mg | Freq: Once | INTRAMUSCULAR | Status: DC

## 2018-04-19 MED ORDER — PROPOFOL 500 MG/50ML IV EMUL
INTRAVENOUS | Status: DC | PRN
  Administered 2018-04-19: 75 ug/kg/min via INTRAVENOUS

## 2018-04-19 MED ORDER — DIPHENHYDRAMINE HCL 12.5 MG/5ML PO ELIX: 12.5000 mg | ORAL_SOLUTION | ORAL | Status: DC | PRN

## 2018-04-19 MED ORDER — ONDANSETRON HCL 4 MG/2ML IJ SOLN: 4.0000 mg | Freq: Once | INTRAMUSCULAR | Status: DC | PRN

## 2018-04-19 MED ORDER — SODIUM CHLORIDE 0.9% FLUSH
INTRAVENOUS | Status: DC | PRN
  Administered 2018-04-19: 30 mL

## 2018-04-19 MED ORDER — BUPIVACAINE IN DEXTROSE 0.75-8.25 % IT SOLN
INTRATHECAL | Status: DC | PRN
  Administered 2018-04-19: 1.8 mL via INTRATHECAL

## 2018-04-19 MED ORDER — ONDANSETRON HCL 4 MG PO TABS: 4.0000 mg | ORAL_TABLET | Freq: Four times a day (QID) | ORAL | Status: DC | PRN

## 2018-04-19 MED ORDER — SORBITOL 70 % SOLN: 30.0000 mL | Freq: Every day | Status: DC | PRN

## 2018-04-19 MED ORDER — MIDAZOLAM HCL 2 MG/2ML IJ SOLN
INTRAMUSCULAR | Status: AC
  Filled 2018-04-19: qty 2

## 2018-04-19 MED ORDER — MAGNESIUM CITRATE PO SOLN: 1.0000 | Freq: Once | ORAL | Status: DC | PRN

## 2018-04-19 MED ORDER — PREMIER PROTEIN SHAKE: 11.0000 [oz_av] | Freq: Every day | ORAL | Status: DC | PRN

## 2018-04-19 MED ORDER — OXYCODONE HCL 5 MG PO TABS
5.0000 mg | ORAL_TABLET | ORAL | Status: DC | PRN
  Administered 2018-04-19 – 2018-04-20 (×4): 10 mg via ORAL
  Filled 2018-04-19 (×4): qty 2

## 2018-04-19 MED ORDER — METOCLOPRAMIDE HCL 5 MG PO TABS: 5.0000 mg | ORAL_TABLET | Freq: Three times a day (TID) | ORAL | Status: DC | PRN

## 2018-04-19 MED ORDER — MENTHOL 3 MG MT LOZG
1.0000 | LOZENGE | OROMUCOSAL | Status: DC | PRN
Start: 2018-04-19 — End: 2018-04-20

## 2018-04-19 MED ORDER — FAMOTIDINE 20 MG PO TABS
20.0000 mg | ORAL_TABLET | Freq: Every day | ORAL | Status: DC
  Administered 2018-04-20: 20 mg via ORAL
  Filled 2018-04-19 (×2): qty 1

## 2018-04-19 MED ORDER — DEXAMETHASONE SODIUM PHOSPHATE 10 MG/ML IJ SOLN
INTRAMUSCULAR | Status: AC
  Filled 2018-04-19: qty 1

## 2018-04-19 MED ORDER — ACETAMINOPHEN 500 MG PO TABS
1000.0000 mg | ORAL_TABLET | Freq: Three times a day (TID) | ORAL | Status: DC
  Administered 2018-04-19 – 2018-04-20 (×4): 1000 mg via ORAL
  Filled 2018-04-19 (×4): qty 2

## 2018-04-19 MED ORDER — LACTATED RINGERS IV SOLN
INTRAVENOUS | Status: DC
  Administered 2018-04-19: 100 mL/h via INTRAVENOUS

## 2018-04-19 MED ORDER — DOCUSATE SODIUM 100 MG PO CAPS
100.0000 mg | ORAL_CAPSULE | Freq: Two times a day (BID) | ORAL | Status: DC
  Administered 2018-04-19 – 2018-04-20 (×3): 100 mg via ORAL
  Filled 2018-04-19 (×2): qty 1

## 2018-04-19 MED ORDER — DEXAMETHASONE SODIUM PHOSPHATE 10 MG/ML IJ SOLN
INTRAMUSCULAR | Status: DC | PRN
  Administered 2018-04-19: 10 mg via INTRAVENOUS

## 2018-04-19 MED ORDER — CLINDAMYCIN PHOSPHATE 600 MG/50ML IV SOLN
600.0000 mg | Freq: Four times a day (QID) | INTRAVENOUS | Status: AC
  Administered 2018-04-19 (×2): 600 mg via INTRAVENOUS
  Filled 2018-04-19 (×3): qty 50

## 2018-04-19 MED ORDER — METHOCARBAMOL 500 MG PO TABS
500.0000 mg | ORAL_TABLET | Freq: Four times a day (QID) | ORAL | Status: DC | PRN
  Administered 2018-04-19 – 2018-04-20 (×2): 500 mg via ORAL
  Filled 2018-04-19 (×2): qty 1

## 2018-04-19 MED ORDER — FENTANYL CITRATE (PF) 100 MCG/2ML IJ SOLN
INTRAMUSCULAR | Status: DC | PRN
  Administered 2018-04-19: 50 ug via INTRAVENOUS

## 2018-04-19 MED ORDER — METHOCARBAMOL 1000 MG/10ML IJ SOLN: 500.0000 mg | Freq: Four times a day (QID) | INTRAVENOUS | Status: DC | PRN

## 2018-04-19 MED ORDER — ONDANSETRON HCL 4 MG/2ML IJ SOLN
INTRAMUSCULAR | Status: AC
  Filled 2018-04-19: qty 2

## 2018-04-19 MED ORDER — POLYETHYLENE GLYCOL 3350 17 G PO PACK: 17.0000 g | PACK | Freq: Every day | ORAL | Status: DC | PRN

## 2018-04-19 MED ORDER — POVIDONE-IODINE 10 % EX SWAB: 2.0000 "application " | Freq: Once | CUTANEOUS | Status: DC

## 2018-04-19 MED ORDER — FENTANYL CITRATE (PF) 250 MCG/5ML IJ SOLN
INTRAMUSCULAR | Status: AC
  Filled 2018-04-19: qty 5

## 2018-04-19 SURGICAL SUPPLY — 58 items
BANDAGE ESMARK 6X9 LF (GAUZE/BANDAGES/DRESSINGS) ×1 IMPLANT
BEARIN INSERT TIBIAL SZ5 9 (Orthopedic Implant) ×2 IMPLANT
BEARIN INSERT TIBIAL SZ5 9MM (Orthopedic Implant) ×1 IMPLANT
BEARING INSERT TIBIAL SZ5 9 (Orthopedic Implant) ×1 IMPLANT
BLADE SAG 18X100X1.27 (BLADE) ×6 IMPLANT
BNDG ELASTIC 6X10 VLCR STRL LF (GAUZE/BANDAGES/DRESSINGS) ×3 IMPLANT
BNDG ELASTIC 6X15 VLCR STRL LF (GAUZE/BANDAGES/DRESSINGS) ×3 IMPLANT
BNDG ESMARK 6X9 LF (GAUZE/BANDAGES/DRESSINGS) ×3
BOWL SMART MIX CTS (DISPOSABLE) IMPLANT
CLOSURE STERI-STRIP 1/2X4 (GAUZE/BANDAGES/DRESSINGS) ×2
CLSR STERI-STRIP ANTIMIC 1/2X4 (GAUZE/BANDAGES/DRESSINGS) ×4 IMPLANT
COMP POSTERIOR FEMORAL SZ4 LFT (Femur) ×3 IMPLANT
COMPONENT PSTRER FEMRL SZ4 LT (Femur) ×1 IMPLANT
COVER SURGICAL LIGHT HANDLE (MISCELLANEOUS) ×3 IMPLANT
CUFF TOURNIQUET SINGLE 34IN LL (TOURNIQUET CUFF) ×3 IMPLANT
DRAPE EXTREMITY T 121X128X90 (DRAPE) ×3 IMPLANT
DRAPE HALF SHEET 40X57 (DRAPES) ×3 IMPLANT
DRAPE IMP U-DRAPE 54X76 (DRAPES) ×3 IMPLANT
DRAPE U-SHAPE 47X51 STRL (DRAPES) ×3 IMPLANT
DRSG MEPILEX BORDER 4X12 (GAUZE/BANDAGES/DRESSINGS) ×3 IMPLANT
DRSG MEPILEX BORDER 4X8 (GAUZE/BANDAGES/DRESSINGS) ×3 IMPLANT
DURAPREP 26ML APPLICATOR (WOUND CARE) ×6 IMPLANT
ELECT CAUTERY BLADE 6.4 (BLADE) ×3 IMPLANT
ELECT REM PT RETURN 9FT ADLT (ELECTROSURGICAL) ×3
ELECTRODE REM PT RTRN 9FT ADLT (ELECTROSURGICAL) ×1 IMPLANT
FACESHIELD WRAPAROUND (MASK) ×6 IMPLANT
GLOVE BIO SURGEON STRL SZ7.5 (GLOVE) ×6 IMPLANT
GLOVE BIOGEL PI IND STRL 8 (GLOVE) ×2 IMPLANT
GLOVE BIOGEL PI INDICATOR 8 (GLOVE) ×4
GOWN STRL REUS W/ TWL LRG LVL3 (GOWN DISPOSABLE) ×2 IMPLANT
GOWN STRL REUS W/ TWL XL LVL3 (GOWN DISPOSABLE) ×1 IMPLANT
GOWN STRL REUS W/TWL LRG LVL3 (GOWN DISPOSABLE) ×4
GOWN STRL REUS W/TWL XL LVL3 (GOWN DISPOSABLE) ×2
HANDPIECE INTERPULSE COAX TIP (DISPOSABLE) ×2
IMMOBILIZER KNEE 22 UNIV (SOFTGOODS) ×3 IMPLANT
KIT BASIN OR (CUSTOM PROCEDURE TRAY) ×3 IMPLANT
KIT TURNOVER KIT B (KITS) ×3 IMPLANT
KNEE PATELLA ASYMMETRIC 9X29 (Knees) ×3 IMPLANT
KNEE TIBIAL COMPONENT SZ5 (Knees) ×3 IMPLANT
MANIFOLD NEPTUNE II (INSTRUMENTS) ×3 IMPLANT
NEEDLE 18GX1X1/2 (RX/OR ONLY) (NEEDLE) ×3 IMPLANT
NS IRRIG 1000ML POUR BTL (IV SOLUTION) ×3 IMPLANT
PACK TOTAL JOINT (CUSTOM PROCEDURE TRAY) ×3 IMPLANT
PAD ARMBOARD 7.5X6 YLW CONV (MISCELLANEOUS) ×3 IMPLANT
SET HNDPC FAN SPRY TIP SCT (DISPOSABLE) ×1 IMPLANT
SUCTION FRAZIER HANDLE 10FR (MISCELLANEOUS) ×2
SUCTION TUBE FRAZIER 10FR DISP (MISCELLANEOUS) ×1 IMPLANT
SUT MNCRL AB 4-0 PS2 18 (SUTURE) ×3 IMPLANT
SUT MON AB 2-0 CT1 27 (SUTURE) ×3 IMPLANT
SUT VIC AB 0 CT1 27 (SUTURE) ×2
SUT VIC AB 0 CT1 27XBRD ANBCTR (SUTURE) ×1 IMPLANT
SUT VIC AB 1 CT1 27 (SUTURE) ×4
SUT VIC AB 1 CT1 27XBRD ANBCTR (SUTURE) ×2 IMPLANT
SYR 50ML LL SCALE MARK (SYRINGE) ×3 IMPLANT
TOWEL OR 17X24 6PK STRL BLUE (TOWEL DISPOSABLE) ×3 IMPLANT
TOWEL OR 17X26 10 PK STRL BLUE (TOWEL DISPOSABLE) ×3 IMPLANT
TRAY CATH 16FR W/PLASTIC CATH (SET/KITS/TRAYS/PACK) IMPLANT
TRAY FOLEY BAG SILVER LF 14FR (CATHETERS) IMPLANT

## 2018-04-19 NOTE — Anesthesia Procedure Notes (Signed)
Anesthesia Regional Block: Adductor canal block   Pre-Anesthetic Checklist: ,, timeout performed, Correct Patient, Correct Site, Correct Laterality, Correct Procedure, Correct Position, site marked, Risks and benefits discussed,  Surgical consent,  Pre-op evaluation,  At surgeon's request and post-op pain management  Laterality: Left  Prep: chloraprep       Needles:  Injection technique: Single-shot  Needle Type: Echogenic Stimulator Needle     Needle Length: 9cm  Needle Gauge: 21   Needle insertion depth: 7 cm   Additional Needles:   Procedures:,,,, ultrasound used (permanent image in chart),,,,  Narrative:  Start time: 04/19/2018 7:10 AM End time: 04/19/2018 7:15 AM Injection made incrementally with aspirations every 5 mL.  Performed by: Personally  Anesthesiologist: Josephine Igo, MD  Additional Notes: Timeout performed. Patient sedated. Relevant anatomy ID'd using [place]. Incremental 2-43ml injection of LA with frequent aspiration. Patient tolerated procedure well.        Left Adductor Canal Block

## 2018-04-19 NOTE — Progress Notes (Signed)
Orthopedic Tech Progress Note Patient Details:  Erin Vasquez September 30, 1955 [id number]  CPM Left Knee CPM Left Knee: On Left Knee Flexion (Degrees): 90 Left Knee Extension (Degrees): 0     [place] Pink 04/19/2018, 10:36 AM

## 2018-04-19 NOTE — Interval H&P Note (Signed)
History and Physical Interval Note:  04/19/2018 7:13 AM  Erin Vasquez  has presented today for surgery, with the diagnosis of OA LEFT KNEE  The various methods of treatment have been discussed with the patient and family. After consideration of risks, benefits and other options for treatment, the patient has consented to  Procedure(s): LEFT TOTAL KNEE ARTHROPLASTY (Left) as a surgical intervention .  The patient's history has been reviewed, patient examined, no change in status, stable for surgery.  I have reviewed the patient's chart and labs.  Questions were answered to the patient's satisfaction.     [name]

## 2018-04-19 NOTE — Anesthesia Procedure Notes (Signed)
Spinal  Patient location during procedure: OR Start time: 04/19/2018 7:31 AM Staffing Anesthesiologist: Josephine Igo, MD Performed: anesthesiologist  Preanesthetic Checklist Completed: patient identified, site marked, surgical consent, pre-op evaluation, timeout performed, IV checked, risks and benefits discussed and monitors and equipment checked Spinal Block Patient position: sitting Prep: DuraPrep Patient monitoring: heart rate, cardiac monitor, continuous pulse ox and blood pressure Approach: midline Location: L4-5 Injection technique: single-shot Needle Needle type: Pencan  Needle gauge: 24 G Needle length: 9 cm Needle insertion depth: 6 cm Assessment Sensory level: T4 Additional Notes Patient tolerated procedure well. Adequate sensory level.

## 2018-04-19 NOTE — Anesthesia Postprocedure Evaluation (Signed)
Anesthesia Post Note  Patient: Erin Vasquez  Procedure(s) Performed: LEFT TOTAL KNEE ARTHROPLASTY (Left Knee)     Patient location during evaluation: PACU Anesthesia Type: Spinal Level of consciousness: oriented and awake and alert Pain management: pain level controlled Vital Signs Assessment: post-procedure vital signs reviewed and stable Respiratory status: spontaneous breathing, respiratory function stable and nonlabored ventilation Cardiovascular status: blood pressure returned to baseline and stable Postop Assessment: no headache, no backache, no apparent nausea or vomiting, spinal receding and patient able to bend at knees Anesthetic complications: no    Last Vitals:  Vitals:   04/19/18 0945 04/19/18 0951  BP:  127/81  Pulse: (!) 45 (!) 50  Resp: 12 (!) 22  Temp:    SpO2: 100% 98%    Last Pain:  Vitals:   04/19/18 1000  PainSc: 7                  Rakiya Krawczyk A.

## 2018-04-19 NOTE — Evaluation (Signed)
Physical Therapy Evaluation Patient Details Name: Erin Vasquez MRN: [id number] DOB: May 15, 1956 Today's Date: 04/19/2018   History of Present Illness  Gudrun Axe is a 62 y.o. F s/p L TKR. PMH includes HTN, OA, DVT, PE, Gerd, Hypothyroidism, L Shoulder RTC, Hiatal hernia, Sleep Apnea, Uterine Cx, Lupus anticoagulant disorder.   Clinical Impression  Pt presents with problems above and deficits below. Pt was educated on precautions and supine HEP. Pt performed bed mobility with Supervision. Pt performed sit<>stand transfers and ambulated with RW and MinG. Will continue to follow acutely to support independence, mobility, and safety.     Follow Up Recommendations Follow surgeon's recommendation for DC plan and follow-up therapies;Supervision for mobility/OOB    Equipment Recommendations  3in1 (PT)    Recommendations for Other Services OT consult     Precautions / Restrictions Precautions Precautions: Knee;Fall Precaution Booklet Issued: Yes (comment) Precaution Comments: Pt educated on precautions and supine HEP Restrictions Weight Bearing Restrictions: Yes LLE Weight Bearing: Weight bearing as tolerated      Mobility  Bed Mobility Overal bed mobility: Needs Assistance Bed Mobility: Supine to Sit     Supine to sit: Supervision     General bed mobility comments: Pt required increased time to sit up at EOB, but had no visible LOB or reported difficulty.   Transfers Overall transfer level: Needs assistance Equipment used: Rolling walker (2 wheeled) Transfers: Sit to/from Stand Sit to Stand: Min guard         General transfer comment: Pt required cues for safe hand placement and sequencing using RW. Pt required MinG for sit<>stand, but had no difficulties with standing.   Ambulation/Gait Ambulation/Gait assistance: Min guard Gait Distance (Feet): 50 Feet Assistive device: Rolling walker (2 wheeled) Gait Pattern/deviations: Step-to pattern;Decreased weight shift to  left;Decreased stance time - left;Decreased step length - right;Wide base of support Gait velocity: Decreased   General Gait Details: Pt had decreased step length, and decreased weight shift to the right, but seemed steady during ambulation with RW and MinG. Pt was able to dual task and hold conversation during ambulation, was attentive to the environment, and moved well.   Stairs            Wheelchair Mobility    Modified Rankin (Stroke Patients Only)       Balance Overall balance assessment: Needs assistance Sitting-balance support: No upper extremity supported;Feet supported Sitting balance-Leahy Scale: Good Sitting balance - Comments: Pt was able to scoot hips to EOB with ease   Standing balance support: Bilateral upper extremity supported Standing balance-Leahy Scale: Poor Standing balance comment: Pt used RW for balance.                              Pertinent Vitals/Pain Pain Assessment: Faces Faces Pain Scale: Hurts a little bit Pain Location: L Knee Pain Descriptors / Indicators: Grimacing;Guarding Pain Intervention(s): Limited activity within patient's tolerance;Monitored during session;Repositioned;RN gave pain meds during session    [place] expects to be discharged to:: Private residence Living Arrangements: Spouse/significant other Available Help at Discharge: Available PRN/intermittently;Family Type of Home: House Home Access: Stairs to enter Entrance Stairs-Rails: Can reach both Entrance Stairs-Number of Steps: 2 Home Layout: Able to live on main level with bedroom/bathroom;Two level Home Equipment: Grab bars - tub/shower;Other (comment);Shower seat;Walker - 2 wheels(Lift chair)      Prior Function Level of Independence: Independent         Comments: Pt was visiting  the gym 3x/week for 3 hours/day     Hand Dominance   Dominant Hand: Right    Extremity/Trunk Assessment   Upper Extremity Assessment Upper  Extremity Assessment: Defer to OT evaluation    Lower Extremity Assessment Lower Extremity Assessment: LLE deficits/detail LLE Deficits / Details: Pain and deficits as expected s/p L TKR. Pt reports decreased sensation on L leg from hip-knee from previous surgeries.  LLE Sensation: decreased light touch    Cervical / Trunk Assessment Cervical / Trunk Assessment: Normal  Communication   Communication: No difficulties  Cognition Arousal/Alertness: Awake/alert Behavior During Therapy: WFL for tasks assessed/performed Overall Cognitive Status: Within Functional Limits for tasks assessed                                 General Comments: Pt is pleasant       General Comments General comments (skin integrity, edema, etc.): BP 138/77 at start of session; proceeded with treatment.     Exercises Total Joint Exercises Ankle Circles/Pumps: AROM;Both;20 reps;Supine Quad Sets: AROM;Left;10 reps;Supine Heel Slides: AROM;Left;10 reps;Supine   Assessment/Plan    PT Assessment Patient needs continued PT services  PT Problem List Decreased strength;Decreased range of motion;Decreased balance;Decreased mobility;Decreased knowledge of use of DME;Decreased knowledge of precautions;Pain       PT Treatment Interventions DME instruction;Gait training;Stair training;Functional mobility training;Therapeutic activities;Therapeutic exercise;Balance training;Patient/family education    PT Goals (Current goals can be found in the Care Plan section)  Acute Rehab PT Goals Patient Stated Goal: To go home PT Goal Formulation: With patient Time For Goal Achievement: 05/03/18 Potential to Achieve Goals: Good    Frequency 7X/week   Barriers to discharge        Co-evaluation               AM-PAC PT "6 Clicks" Daily Activity  Outcome Measure Difficulty turning over in bed (including adjusting bedclothes, sheets and blankets)?: A Little Difficulty moving from lying on back to  sitting on the side of the bed? : A Little Difficulty sitting down on and standing up from a chair with arms (e.g., wheelchair, bedside commode, etc,.)?: Unable Help needed moving to and from a bed to chair (including a wheelchair)?: A Little Help needed walking in hospital room?: A Little Help needed climbing 3-5 steps with a railing? : A Lot 6 Click Score: 15    End of Session Equipment Utilized During Treatment: Gait belt Activity Tolerance: Patient tolerated treatment well Patient left: in chair;with call bell/phone within reach;with chair alarm set Nurse Communication: Mobility status PT Visit Diagnosis: Other abnormalities of gait and mobility (R26.89);Muscle weakness (generalized) (M62.81);Difficulty in walking, not elsewhere classified (R26.2);Pain Pain - Right/Left: Left Pain - part of body: Knee    Time: 4536-4680 PT Time Calculation (min) (ACUTE ONLY): 34 min   Charges:   PT Evaluation $PT Eval Low Complexity: 1 Low PT Treatments $Gait Training: 8-22 mins   Elwin Mocha, S-DPT [place] [facility] [phone]     04/19/2018, 6:43 PM

## 2018-04-19 NOTE — Op Note (Signed)
DATE OF SURGERY:  04/19/2018 TIME: 8:39 AM  PATIENT NAME:  Erin Vasquez   AGE: 62 y.o.    PRE-OPERATIVE DIAGNOSIS:  OA LEFT KNEE  POST-OPERATIVE DIAGNOSIS:  Same  PROCEDURE:  Procedure(s): LEFT TOTAL KNEE ARTHROPLASTY   SURGEON:  Penny Arrambide D, MD   ASSISTANT:  Roxan Hockey, PA-C, he was present and scrubbed throughout the case, critical for completion in a timely fashion, and for retraction, instrumentation, and closure.    OPERATIVE IMPLANTS: [employer] Triathlon Posterior Stabilized. Press fit knee  Femur size 4, Tibia size 5, Patella size 29 3-peg oval button, with a 9 mm polyethylene insert.   PREOPERATIVE INDICATIONS:  Erin Vasquez is a 62 y.o. year old female with end stage bone on bone degenerative arthritis of the knee who failed conservative treatment, including injections, antiinflammatories, activity modification, and assistive devices, and had significant impairment of their activities of daily living, and elected for Total Knee Arthroplasty.   The risks, benefits, and alternatives were discussed at length including but not limited to the risks of infection, bleeding, nerve injury, stiffness, blood clots, the need for revision surgery, cardiopulmonary complications, among others, and they were willing to proceed.   OPERATIVE DESCRIPTION:  The patient was brought to the operative room and placed in a supine position.  General anesthesia was administered.  IV antibiotics were given.  The lower extremity was prepped and draped in the usual sterile fashion.  Time out was performed.  The leg was elevated and exsanguinated and the tourniquet was inflated.  Anterior approach was performed.  The patella was everted and osteophytes were removed.  The anterior horn of the medial and lateral meniscus was removed.   The distal femur was opened with the drill and the intramedullary distal femoral cutting jig was utilized, set at 5 degrees resecting 8 mm off the distal  femur.  Care was taken to protect the collateral ligaments.  The distal femoral sizing jig was applied, taking care to avoid notching.  Then the 4-in-1 cutting jig was applied and the anterior and posterior femur was cut, along with the chamfer cuts.  All posterior osteophytes were removed.  The flexion gap was then measured and was symmetric with the extension gap.  Then the extramedullary tibial cutting jig was utilized making the appropriate cut using the anterior tibial crest as a reference building in appropriate posterior slope.  Care was taken during the cut to protect the medial and collateral ligaments.  The proximal tibia was removed along with the posterior horns of the menisci.  The PCL was sacrificed.    The extensor gap was measured and was approximately 60mm.    I completed the distal femoral preparation using the appropriate jig to prepare the box.  The patella was then measured, and cut with the saw.    The proximal tibia sized and prepared accordingly with the reamer and the punch, and then all components were trialed with the above sized poly insert.  The knee was found to have excellent balance and full motion.    The above named components were then impacted into place and Poly tibial piece and patella were inserted.  I was very happy with his stability and ROM  I performed a periarticular injection with marcaine and toradol  The knee was easily taken through a range of motion and the patella tracked well and the knee irrigated copiously and the parapatellar and subcutaneous tissue closed with vicryl, and monocryl with steri strips for the skin.  The incision was dressed with sterile gauze and the tourniquet released and the patient was awakened and returned to the PACU in stable and satisfactory condition.  There were no complications.  Total tourniquet time was roughly 60 minutes.   POSTOPERATIVE PLAN: post op Abx, DVT px: SCD's, TED's, Early ambulation and chemical  px

## 2018-04-19 NOTE — OR Nursing (Signed)
PRE-OP LEFT CALF MEASUREMENT IS 44 CENTIMETERS. MEASURING TAPE MARKED AND WITH CHART.

## 2018-04-19 NOTE — Transfer of Care (Signed)
Immediate Anesthesia Transfer of Care Note  Patient: Erin Vasquez  Procedure(s) Performed: LEFT TOTAL KNEE ARTHROPLASTY (Left Knee)  Patient Location: PACU  Anesthesia Type:Spinal and MAC combined with regional for post-op pain  Level of Consciousness: awake, alert  and oriented  Airway & Oxygen Therapy: Patient Spontanous Breathing and Patient connected to face mask oxygen  Post-op Assessment: Report given to RN and Post -op Vital signs reviewed and stable  Post vital signs: Reviewed and stable  Last Vitals:  Vitals Value Taken Time  BP    Temp    Pulse 54 04/19/2018  9:34 AM  Resp    SpO2 96 % 04/19/2018  9:34 AM  Vitals shown include unvalidated device data.  Last Pain:  Vitals:   04/19/18 0601  PainSc: 0-No pain      Patients Stated Pain Goal: 3 (53/01/04 0459)  Complications: No apparent anesthesia complications

## 2018-04-20 ENCOUNTER — Encounter (HOSPITAL_COMMUNITY): Payer: Self-pay | Admitting: Orthopedic Surgery

## 2018-04-20 NOTE — Evaluation (Signed)
Occupational Therapy Evaluation Patient Details Name: Erin Vasquez MRN: [id number] DOB: Feb 10, 1956 Today's Date: 04/20/2018    History of Present Illness Erin Vasquez is a 62 y.o. F s/p L TKR. PMH includes HTN, OA, DVT, PE, Gerd, Hypothyroidism, L Shoulder RTC, Hiatal hernia, Sleep Apnea, Uterine Cx, Lupus anticoagulant disorder.    Clinical Impression   This 62 y/o female presents with the above. At baseline pt is independent with ADLs and functional mobility, was active. Pt demonstrating functional mobility using RW with minguard assist throughout. Pt currently requires setup assist for seated UB ADL, minA for LB ADLs secondary to LLE functional limitations. Pt reports she will return home with significant other who is able to assist with ADLs PRN. Will continue to follow while pt remains in acute setting to maximize her overall safety and independence with ADLs and mobility.     Follow Up Recommendations  Follow surgeon's recommendation for DC plan and follow-up therapies;Supervision/Assistance - 24 hour    Equipment Recommendations  None recommended by OT           Precautions / Restrictions Precautions Precautions: Knee;Fall Precaution Booklet Issued: Yes (comment) Precaution Comments: Pt educated on precautions and progressed supine HEP Restrictions Weight Bearing Restrictions: Yes LLE Weight Bearing: Weight bearing as tolerated      Mobility Bed Mobility               General bed mobility comments: Pt in chair upon arrival  Transfers Overall transfer level: Needs assistance Equipment used: Rolling walker (2 wheeled) Transfers: Sit to/from Stand Sit to Stand: Supervision         General transfer comment: Pt demonstrated safe hand placement, and used RW appopriately.    Balance Overall balance assessment: Needs assistance Sitting-balance support: Feet supported;No upper extremity supported Sitting balance-Leahy Scale: Good Sitting balance - Comments:  Pt was able to sit in chair and perform seated HEP   Standing balance support: No upper extremity supported;Bilateral upper extremity supported Standing balance-Leahy Scale: Fair Standing balance comment: Pt was able to adjust gown in static standing without UE support. Pt used RW and rails for balance and support during dynamic activities.                            ADL either performed or assessed with clinical judgement   ADL Overall ADL's : Needs assistance/impaired Eating/Feeding: Independent;Sitting   Grooming: Min guard;Standing;Wash/dry hands   Upper Body Bathing: Set up;Sitting   Lower Body Bathing: Minimal assistance;Sit to/from stand   Upper Body Dressing : Set up;Sitting   Lower Body Dressing: Minimal assistance;Sit to/from stand Lower Body Dressing Details (indicate cue type and reason): educated on dressing operated LE first  Toilet Transfer: Min guard;Ambulation;BSC;RW Toilet Transfer Details (indicate cue type and reason): over toilet; simulated in transfer to [place] and Hygiene: Min guard;Sit to/from stand   Tub/ Shower Transfer: Tub transfer;Min guard;Ambulation;3 in 1;Rolling walker Tub/Shower Transfer Details (indicate cue type and reason): simulated in room; educated on technique for safe transfer to 3:1 and handout issued  Functional mobility during ADLs: Min guard;Rolling walker       Vision         Perception     Praxis      Pertinent Vitals/Pain Pain Assessment: Faces Faces Pain Scale: Hurts a little bit Pain Location: L Knee Pain Descriptors / Indicators: Grimacing;Guarding;Sore Pain Intervention(s): Monitored during session;Limited activity within patient's tolerance;Repositioned  Hand Dominance Right   Extremity/Trunk Assessment Upper Extremity Assessment Upper Extremity Assessment: Overall WFL for tasks assessed   Lower Extremity Assessment Lower Extremity Assessment: Defer to PT  evaluation   Cervical / Trunk Assessment Cervical / Trunk Assessment: Normal   Communication Communication Communication: No difficulties   Cognition Arousal/Alertness: Awake/alert Behavior During Therapy: WFL for tasks assessed/performed Overall Cognitive Status: Within Functional Limits for tasks assessed                                 General Comments: Pt is pleasant    General Comments       Exercises Exercises: Total Joint Total Joint Exercises Ankle Circles/Pumps: AROM;Both;20 reps;Seated Quad Sets: AROM;Left;10 reps;Seated Short Arc Quad: AROM;Left;10 reps;Seated Heel Slides: AROM;Left;10 reps;Seated Hip ABduction/ADduction: AROM;10 reps;Seated;Left Straight Leg Raises: AROM;Left;10 reps;Seated Knee Flexion: AROM;Left;Seated;Other (comment);20 reps(Assisted X10, unassisted X10. )   Shoulder Instructions      Home Living Family/patient expects to be discharged to:: Private residence Living Arrangements: Spouse/significant other Available Help at Discharge: Available PRN/intermittently;Family Type of Home: House Home Access: Stairs to enter [employer] of Steps: 2 Entrance Stairs-Rails: Can reach both Home Layout: Able to live on main level with bedroom/bathroom;Two level     Bathroom Shower/Tub: Tub/shower unit(reports small ledge to step over)   [employer]: Standard     Home Equipment: Grab bars - tub/shower;Other (comment);Shower seat;Walker - 2 wheels;Bedside commode(lift chair )          Prior Functioning/Environment Level of Independence: Independent        Comments: Pt was visiting the gym 3x/week for 3 hours/day        OT Problem List: Impaired balance (sitting and/or standing);Decreased strength;Decreased range of motion;Pain      OT Treatment/Interventions: Self-care/ADL training;DME and/or AE instruction;Balance training;Therapeutic activities;Therapeutic exercise;Patient/family education    OT  Goals(Current goals can be found in the care plan section) Acute Rehab OT Goals Patient Stated Goal: return home soon  OT Goal Formulation: With patient Time For Goal Achievement: 05/04/18 Potential to Achieve Goals: Good  OT Frequency: Min 2X/week   Barriers to D/C:            Co-evaluation              AM-PAC PT "6 Clicks" Daily Activity     Outcome Measure Help from another person eating meals?: None Help from another person taking care of personal grooming?: None Help from another person toileting, which includes using toliet, bedpan, or urinal?: A Little Help from another person bathing (including washing, rinsing, drying)?: A Little Help from another person to put on and taking off regular upper body clothing?: None Help from another person to put on and taking off regular lower body clothing?: A Little 6 Click Score: 21   End of Session Equipment Utilized During Treatment: Gait belt;Rolling walker CPM Left Knee CPM Left Knee: Off Nurse Communication: Mobility status  Activity Tolerance: Patient tolerated treatment well Patient left: in chair;with call bell/phone within reach  OT Visit Diagnosis: Other abnormalities of gait and mobility (R26.89)                Time: 9833-8250 OT Time Calculation (min): 26 min Charges:  OT General Charges $OT Visit: 1 Visit OT Evaluation $OT Eval Low Complexity: 1 Low OT Treatments $Self Care/Home Management : 8-22 mins  Erin Vasquez, OT Pager [phone] 04/20/2018   Erin Vasquez 04/20/2018, 4:08 PM

## 2018-04-20 NOTE — Progress Notes (Signed)
04/20/18 1300  PT Visit Information  Last PT Received On 04/20/18  Assistance Needed +1  History of Present Illness Erin Vasquez is a 62 y.o. F s/p L TKR. PMH includes HTN, OA, DVT, PE, Gerd, Hypothyroidism, L Shoulder RTC, Hiatal hernia, Sleep Apnea, Uterine Cx, Lupus anticoagulant disorder.   Subjective Data  Patient Stated Goal To do crafts  Precautions  Precautions Knee;Fall  Precaution Booklet Issued Yes (comment)  Precaution Comments Pt educated on precautions and progressed supine HEP  Restrictions  Weight Bearing Restrictions Yes  LLE Weight Bearing WBAT  Pain Assessment  Pain Assessment Faces  Faces Pain Scale 2  Pain Location L Knee  Pain Descriptors / Indicators Grimacing;Guarding;Sore  Pain Intervention(s) Limited activity within patient's tolerance;Monitored during session;Repositioned  Cognition  Arousal/Alertness Awake/alert  Behavior During Therapy WFL for tasks assessed/performed  Overall Cognitive Status Within Functional Limits for tasks assessed  General Comments Pt is pleasant   Bed Mobility  General bed mobility comments Pt in chair upon arrival  Transfers  Overall transfer level Needs assistance  Equipment used Rolling walker (2 wheeled)  Transfers Sit to/from Stand  Sit to Stand Supervision  General transfer comment Pt demonstrated safe hand placement, and used RW appopriately.  Ambulation/Gait  Ambulation/Gait assistance Supervision  Gait Distance (Feet) 400 Feet  Assistive device Rolling walker (2 wheeled)  Gait Pattern/deviations Step-through pattern;Decreased stride length  General Gait Details Pt responded to cues to stand up right. Pt had good weight shift to R leg and relied on step-through pattern. Pt was reminded to not pick up RW during ambulation. Pt was able to dual task and make sharper turns. Was educated on decreasing clutter in the home for ease of ambulation.  Gait velocity Decreased  Stairs Yes  Stairs assistance Supervision   Stair Management Two rails;Step to pattern;Forwards  Number of Stairs 4  General stair comments Pt reports confidence on stairs, and demonstrated good hand placement and sequencing without cues. Pt continues to guard L knee, but has increased control on descent.  Balance  Overall balance assessment Needs assistance  Sitting-balance support Feet supported;No upper extremity supported  Sitting balance-Leahy Scale Good  Sitting balance - Comments Pt was able to sit in chair and perform seated HEP  Standing balance support No upper extremity supported;Bilateral upper extremity supported  Standing balance-Leahy Scale Fair  Standing balance comment Pt was able to adjust gown in static standing without UE support. Pt used RW and rails for balance and support during dynamic activities.   Exercises  Exercises Total Joint  Total Joint Exercises  Ankle Circles/Pumps AROM;Both;20 reps;Seated  Quad Sets AROM;Left;10 reps;Seated  Heel Slides AROM;Left;10 reps;Seated  Short Arc Quad AROM;Left;10 reps;Seated  Hip ABduction/ADduction AROM;10 reps;Seated;Left  Straight Leg Raises AROM;Left;10 reps;Seated  Knee Flexion AROM;Left;Seated;Other (comment);20 reps (Assisted X10, unassisted X10. )  PT - End of Session  Equipment Utilized During Treatment Gait belt  Activity Tolerance Patient tolerated treatment well  Patient left in chair;with call bell/phone within reach  Nurse Communication Mobility status;Other (comment) (Pt has not had a bowel movement yet)  CPM Left Knee  CPM Left Knee Off   PT - Assessment/Plan  PT Plan Current plan remains appropriate  PT Visit Diagnosis Other abnormalities of gait and mobility (R26.89);Muscle weakness (generalized) (M62.81);Difficulty in walking, not elsewhere classified (R26.2);Pain  Pain - Right/Left Left  Pain - part of body Knee  PT Frequency (ACUTE ONLY) 7X/week  Follow Up Recommendations Follow surgeon's recommendation for DC plan and follow-up  therapies;Supervision for  mobility/OOB  PT equipment 3in1 (PT)  AM-PAC PT "6 Clicks" Daily Activity Outcome Measure  Difficulty turning over in bed (including adjusting bedclothes, sheets and blankets)? 3  Difficulty moving from lying on back to sitting on the side of the bed?  3  Difficulty sitting down on and standing up from a chair with arms (e.g., wheelchair, bedside commode, etc,.)? 3  Help needed moving to and from a bed to chair (including a wheelchair)? 3  Help needed walking in hospital room? 3  Help needed climbing 3-5 steps with a railing?  3  6 Click Score 18  Mobility G Code  CK  PT Goal Progression  Progress towards PT goals Progressing toward goals  Acute Rehab PT Goals  PT Goal Formulation With patient  Time For Goal Achievement 05/03/18  Potential to Achieve Goals Good  PT Time Calculation  PT Start Time (ACUTE ONLY) 1332  PT Stop Time (ACUTE ONLY) 1356  PT Time Calculation (min) (ACUTE ONLY) 24 min  PT General Charges  $$ ACUTE PT VISIT 1 Visit  PT Treatments  $Gait Training 8-22 mins  $Therapeutic Exercise 8-22 mins   Pt is progressing toward goals. Pt performed seated HEP and was educated on the importance of precautions. Pt ambulated with RW and Supervision, and climbed steps with rails and Supervision. Pt continues to move well and reports increasing confidence with mobility. Will continue to follow acutely to support independence, mobility, and safety.   Elwin Mocha, S-DPT [place] Student [phone]

## 2018-04-20 NOTE — Care Plan (Signed)
Spoke with patient. Known to me from prior surgery. Will discharge to home with following arrangements.   DME: CPM delivered to home, has other equipment  HHPT: [facility]: [facility] 05/04/18 @ 300  MD follow up: 05/04/18 @ 415   Please contact [place], [place] [phone] with questions or if this plan should need to change.  Thanks

## 2018-04-20 NOTE — Progress Notes (Signed)
Physical Therapy Treatment Patient Details Name: Erin Vasquez MRN: [id number] DOB: [birth year] Today's Date: 04/20/2018    History of Present Illness Erin Vasquez is a 62 y.o. F s/p L TKR. PMH includes HTN, OA, DVT, PE, Gerd, Hypothyroidism, L Shoulder RTC, Hiatal hernia, Sleep Apnea, Uterine Cx, Lupus anticoagulant disorder.     PT Comments    Pt is progressing toward goals. Pt performed supine HEP and was reminded of precautions. Pt performed sit<>stand transfers and ambulation with RW and Supervision. Pt climbed 4 steps using 2 rails with Supervision, and was educated on sequencing and safety. Pt continues to move well. Will continue to follow acutely to support independence, mobility, and safety.   Follow Up Recommendations  Follow surgeon's recommendation for DC plan and follow-up therapies;Supervision for mobility/OOB     Equipment Recommendations  3in1 (PT)    Recommendations for Other Services       Precautions / Restrictions Precautions Precautions: Knee;Fall Precaution Booklet Issued: Yes (comment) Precaution Comments: Pt educated on precautions and progressed supine HEP Restrictions Weight Bearing Restrictions: Yes LLE Weight Bearing: Weight bearing as tolerated    Mobility  Bed Mobility Overal bed mobility: Needs Assistance Bed Mobility: Sit to Supine;Supine to Sit     Supine to sit: Supervision Sit to supine: Supervision   General bed mobility comments: Pt required some increased time to get in and out of bed, but had no reported difficulties  Transfers Overall transfer level: Needs assistance Equipment used: Rolling walker (2 wheeled) Transfers: Sit to/from Stand Sit to Stand: Supervision         General transfer comment: Pt required cues for safe hand placement, but used RW well and reported no difficulties with standing  Ambulation/Gait Ambulation/Gait assistance: Supervision Gait Distance (Feet): 200 Feet Assistive device: Rolling walker (2  wheeled) Gait Pattern/deviations: Decreased step length - right;Decreased stance time - left;Decreased weight shift to left;Step-through pattern Gait velocity: Decreased   General Gait Details: Pt had more flexed posture than yesterday, likely secondary to increase soreness. Pt seemed steady during ambulation, and even let go of RW while standing to adjust gown. Pt continues to be attentive to the environment, and move well. Pt is using a step-through pattern.     Stairs Stairs: Yes Stairs assistance: Supervision Stair Management: Two rails;Step to pattern;Forwards Number of Stairs: 4 General stair comments: Pt had some guarding of L knee when going down the stairs. Pt was educated on sequencing and safe use of rails. Pt climbed and descended 4 steps using 2 rails, and reported no difficulties. PT noticed no buckling in L LE, but pt did have some issues controlling the last ~2 inches of descent.   Wheelchair Mobility    Modified Rankin (Stroke Patients Only)       Balance Overall balance assessment: Needs assistance Sitting-balance support: Feet supported;No upper extremity supported Sitting balance-Leahy Scale: Good Sitting balance - Comments: Pt was able to scoot hips to EOB with ease   Standing balance support: No upper extremity supported;Bilateral upper extremity supported Standing balance-Leahy Scale: Fair Standing balance comment: Pt used RW and rails for balance, but was able to let go of RW to adjust gown, and gesture with arms during conversation.                             Cognition Arousal/Alertness: Awake/alert Behavior During Therapy: WFL for tasks assessed/performed Overall Cognitive Status: Within Functional Limits for tasks assessed  General Comments: Pt is pleasant       Exercises Total Joint Exercises Short Arc Quad: AROM;Left;10 reps;Supine Hip ABduction/ADduction: AROM;Both;10  reps;Supine Straight Leg Raises: AROM;Left;10 reps;Supine Goniometric ROM: 12-78    General Comments        Pertinent Vitals/Pain Pain Assessment: 0-10 Pain Score: 10-Worst pain ever Pain Location: L Knee Pain Descriptors / Indicators: Grimacing;Guarding;Sore Pain Intervention(s): Limited activity within patient's tolerance;Monitored during session;Repositioned    Home Living                      Prior Function            PT Goals (current goals can now be found in the care plan section) Acute Rehab PT Goals Patient Stated Goal: To go home PT Goal Formulation: With patient Time For Goal Achievement: 05/03/18 Potential to Achieve Goals: Good Progress towards PT goals: Progressing toward goals    Frequency    7X/week      PT Plan Current plan remains appropriate    Co-evaluation              AM-PAC PT "6 Clicks" Daily Activity  Outcome Measure  Difficulty turning over in bed (including adjusting bedclothes, sheets and blankets)?: A Little Difficulty moving from lying on back to sitting on the side of the bed? : A Little Difficulty sitting down on and standing up from a chair with arms (e.g., wheelchair, bedside commode, etc,.)?: A Little Help needed moving to and from a bed to chair (including a wheelchair)?: A Little Help needed walking in hospital room?: A Little Help needed climbing 3-5 steps with a railing? : A Little 6 Click Score: 18    End of Session Equipment Utilized During Treatment: Gait belt Activity Tolerance: Patient tolerated treatment well Patient left: in bed;with call bell/phone within reach Nurse Communication: Mobility status PT Visit Diagnosis: Other abnormalities of gait and mobility (R26.89);Muscle weakness (generalized) (M62.81);Difficulty in walking, not elsewhere classified (R26.2);Pain Pain - Right/Left: Left Pain - part of body: Knee     Time: 2876-8115 PT Time Calculation (min) (ACUTE ONLY): 18 min  Charges:   $Gait Training: 8-22 mins           Elwin Mocha, S-DPT [place] Student [phone]            04/20/2018, 1:31 PM

## 2018-04-20 NOTE — Discharge Instructions (Signed)
You may bear weight as tolerated. °Keep your dressing on and dry until follow up. °Take medicine to prevent blood clots as directed. °Take pain medicine as needed with the goal of transitioning to over the counter medicines.  °If needed, you may increase pain medication for the first few days post up to 2 tablets every 4 hours. ° °INSTRUCTIONS AFTER JOINT REPLACEMENT  ° °o Remove items at home which could result in a fall. This includes throw rugs or furniture in walking pathways °o ICE to the affected joint every three hours while awake for 30 minutes at a time, for at least the first 3-5 days, and then as needed for pain and swelling.  Continue to use ice for pain and swelling. You may notice swelling that will progress down to the foot and ankle.  This is normal after surgery.  Elevate your leg when you are not up walking on it.   °o Continue to use the breathing machine you got in the hospital (incentive spirometer) which will help keep your temperature down.  It is common for your temperature to cycle up and down following surgery, especially at night when you are not up moving around and exerting yourself.  The breathing machine keeps your lungs expanded and your temperature down. ° ° °DIET:  As you were doing prior to hospitalization, we recommend a well-balanced diet. ° °DRESSING / WOUND CARE / SHOWERING ° °You may shower 3 days after surgery, but keep the wounds dry during showering.  You may use an occlusive plastic wrap (Press'n Seal for example) with blue painter's tape at edges, NO SOAKING/SUBMERGING IN THE BATHTUB.  If the bandage gets wet, change with a clean dry gauze.  If the incision gets wet, pat the wound dry with a clean towel. ° °ACTIVITY ° °o Increase activity slowly as tolerated, but follow the weight bearing instructions below.   °o No driving for 6 weeks or until further direction given by your physician.  You cannot drive while taking narcotics.  °o No lifting or carrying greater than 10  lbs. until further directed by your surgeon. °o Avoid periods of inactivity such as sitting longer than an hour when not asleep. This helps prevent blood clots.  °o You may return to work once you are authorized by your doctor.  ° ° ° °WEIGHT BEARING  ° °Weight bearing as tolerated with assist device (walker, cane, etc) as directed, use it as long as suggested by your surgeon or therapist, typically at least 4-6 weeks. ° ° °EXERCISES ° °Results after joint replacement surgery are often greatly improved when you follow the exercise, range of motion and muscle strengthening exercises prescribed by your doctor. Safety measures are also important to protect the joint from further injury. Any time any of these exercises cause you to have increased pain or swelling, decrease what you are doing until you are comfortable again and then slowly increase them. If you have problems or questions, call your caregiver or physical therapist for advice.  ° °Rehabilitation is important following a joint replacement. After just a few days of immobilization, the muscles of the leg can become weakened and shrink (atrophy).  These exercises are designed to build up the tone and strength of the thigh and leg muscles and to improve motion. Often times heat used for twenty to thirty minutes before working out will loosen up your tissues and help with improving the range of motion but do not use heat for the first two   weeks following surgery (sometimes heat can increase post-operative swelling).  ° °These exercises can be done on a training (exercise) mat, on the floor, on a table or on a bed. Use whatever works the best and is most comfortable for you.    Use music or television while you are exercising so that the exercises are a pleasant break in your day. This will make your life better with the exercises acting as a break in your routine that you can look forward to.   Perform all exercises about fifteen times, three times per day or as  directed.  You should exercise both the operative leg and the other leg as well. ° °Exercises include: °  °• Quad Sets - Tighten up the muscle on the front of the thigh (Quad) and hold for 5-10 seconds.   °• Straight Leg Raises - With your knee straight (if you were given a brace, keep it on), lift the leg to 60 degrees, hold for 3 seconds, and slowly lower the leg.  Perform this exercise against resistance later as your leg gets stronger.  °• Leg Slides: Lying on your back, slowly slide your foot toward your buttocks, bending your knee up off the floor (only go as far as is comfortable). Then slowly slide your foot back down until your leg is flat on the floor again.  °• Angel Wings: Lying on your back spread your legs to the side as far apart as you can without causing discomfort.  °• Hamstring Strength:  Lying on your back, push your heel against the floor with your leg straight by tightening up the muscles of your buttocks.  Repeat, but this time bend your knee to a comfortable angle, and push your heel against the floor.  You may put a pillow under the heel to make it more comfortable if necessary.  ° °A rehabilitation program following joint replacement surgery can speed recovery and prevent re-injury in the future due to weakened muscles. Contact your doctor or a physical therapist for more information on knee rehabilitation.  ° ° °CONSTIPATION ° °Constipation is defined medically as fewer than three stools per week and severe constipation as less than one stool per week.  Even if you have a regular bowel pattern at home, your normal regimen is likely to be disrupted due to multiple reasons following surgery.  Combination of anesthesia, postoperative narcotics, change in appetite and fluid intake all can affect your bowels.  ° °YOU MUST use at least one of the following options; they are listed in order of increasing strength to get the job done.  They are all available over the counter, and you may need to  use some, POSSIBLY even all of these options:   ° °Drink plenty of fluids (prune juice may be helpful) and high fiber foods °Colace 100 mg by mouth twice a day  °Senokot for constipation as directed and as needed Dulcolax (bisacodyl), take with full glass of water  °Miralax (polyethylene glycol) once or twice a day as needed. ° °If you have tried all these things and are unable to have a bowel movement in the first 3-4 days after surgery call either your surgeon or your primary doctor.   ° °If you experience loose stools or diarrhea, hold the medications until you stool forms back up.  If your symptoms do not get better within 1 week or if they get worse, check with your doctor.  If you experience "the worst abdominal pain ever" or   develop nausea or vomiting, please contact the office immediately for further recommendations for treatment.   ITCHING:  If you experience itching with your medications, try taking only a single pain pill, or even half a pain pill at a time.  You can also use Benadryl over the counter for itching or also to help with sleep.   TED HOSE STOCKINGS:  Use stockings on both legs until for at least 2 weeks or as directed by physician office. They may be removed at night for sleeping.  MEDICATIONS:  See your medication summary on the After Visit Summary that nursing will review with you.  You may have some home medications which will be placed on hold until you complete the course of blood thinner medication.  It is important for you to complete the blood thinner medication as prescribed.  PRECAUTIONS:  If you experience chest pain or shortness of breath - call 911 immediately for transfer to the hospital emergency department.   If you develop a fever greater that 101 F, purulent drainage from wound, increased redness or drainage from wound, foul odor from the wound/dressing, or calf pain - CONTACT YOUR SURGEON.                                                   FOLLOW-UP  APPOINTMENTS:  If you do not already have a post-op appointment, please call the office for an appointment to be seen by your surgeon.  Guidelines for how soon to be seen are listed in your After Visit Summary, but are typically between 1-4 weeks after surgery.  OTHER INSTRUCTIONS:     MAKE SURE YOU:   Understand these instructions.   Get help right away if you are not doing well or get worse.    Thank you for letting us be a part of your medical care team.  It is a privilege we respect greatly.  We hope these instructions will help you stay on track for a fast and full recovery!     Information on my medicine - XARELTO (Rivaroxaban)  This medication education was reviewed with me or my healthcare representative as part of my discharge preparation.  The pharmacist that spoke with me during my hospital stay was:  Saundra Shelling, [facility]  Why was Xarelto prescribed for you? Xarelto was prescribed for you to reduce the risk of blood clots forming after orthopedic surgery. The medical term for these abnormal blood clots is venous thromboembolism (VTE).  What do you need to know about xarelto ? Take your Xarelto ONCE DAILY at the same time every day. You may take it either with or without food.  If you have difficulty swallowing the tablet whole, you may crush it and mix in applesauce just prior to taking your dose.  Take Xarelto exactly as prescribed by your doctor and DO NOT stop taking Xarelto without talking to the doctor who prescribed the medication.  Stopping without other VTE prevention medication to take the place of Xarelto may increase your risk of developing a clot.  After discharge, you should have regular check-up appointments with your healthcare provider that is prescribing your Xarelto.    What do you do if you miss a dose? If you miss a dose, take it as soon as you remember on the same day then continue your regularly scheduled once  daily regimen the next day.  Do not take two doses of Xarelto on the same day.   Important Safety Information A possible side effect of Xarelto is bleeding. You should call your healthcare provider right away if you experience any of the following: ? Bleeding from an injury or your nose that does not stop. ? Unusual colored urine (red or dark brown) or unusual colored stools (red or black). ? Unusual bruising for unknown reasons. ? A serious fall or if you hit your head (even if there is no bleeding).  Some medicines may interact with Xarelto and might increase your risk of bleeding while on Xarelto. To help avoid this, consult your healthcare provider or pharmacist prior to using any new prescription or non-prescription medications, including herbals, vitamins, non-steroidal anti-inflammatory drugs (NSAIDs) and supplements.  This website has more information on Xarelto: [email or web address]

## 2018-04-20 NOTE — Discharge Summary (Signed)
Discharge Summary  Patient ID: Erin Vasquez MRN: [id number] DOB/AGE: 63/09/1955 63 y.o.  Admit date: 04/19/2018 Discharge date: 04/20/2018  Admission Diagnoses:  Primary osteoarthritis of left knee  Discharge Diagnoses:  Principal Problem:   Primary osteoarthritis of left knee Active Problems:   Thyroid activity decreased   Left shoulder pain   GERD (gastroesophageal reflux disease)   History of DVT (deep vein thrombosis)   History of pulmonary embolus (PE)   Primary osteoarthritis of knee   Past Medical History:  Diagnosis Date  . Anemia    during pregnancy  . Anxiety   . Arthritis    "all over" (07/07/2017)  . Bilateral pulmonary embolism ([place]) 10/29/2015   "from my left ankle"  . Childhood asthma    as child, nothing as adult  . Chronic back pain    "all over" (07/07/2017)  . DVT (deep venous thrombosis) ([place]) 10/29/2015   "from my left ankle"  . GERD (gastroesophageal reflux disease)    tx. Tums  . H/O hiatal hernia    "repaired w/gastric bypass OR" (07/07/2017)  . History of esophageal dilatation   . Hypertension   . Hypothyroidism   . Lupus anticoagulant disorder ([facility])    Dr. Alen Blew   . Neuromuscular disorder ([place])    L thigh - Numbness, nerve damage    . Sacroiliac pain    "left thigh has been numb since 01/2016 after appendectomy" (07/07/2017)  . Sleep apnea    07/07/2017 "bariatric dr says I don't need CPAP anymore" (07/07/2017)  . Thyroid disease   . Uterine cancer ([place]) 01/2014  . Varicose veins   . Ventral hernia   . Vitamin D deficiency     Surgeries: Procedure(s): LEFT TOTAL KNEE ARTHROPLASTY on 04/19/2018   Consultants (if any):   Discharged Condition: Improved  Hospital Course: Erin Vasquez is an 62 y.o. female who was admitted 04/19/2018 with a diagnosis of Primary osteoarthritis of left knee and went to the operating room on 04/19/2018 and underwent the above named procedures.    She was given perioperative antibiotics:   Anti-infectives (From admission, onward)   Start     Dose/Rate Route Frequency Ordered Stop   04/19/18 1330  clindamycin (CLEOCIN) IVPB 600 mg     600 mg 100 mL/hr over 30 Minutes Intravenous Every 6 hours 04/19/18 1316 04/19/18 2129   04/19/18 0900  vancomycin (VANCOCIN) IVPB 1000 mg/200 mL premix     1,000 mg 200 mL/hr over 60 Minutes Intravenous To ShortStay Surgical 04/18/18 0741 04/19/18 0700    .  She was given sequential compression devices, early ambulation, and Xarelto for DVT prophylaxis.  She benefited maximally from the hospital stay and there were no complications.    Recent vital signs:  Vitals:   04/19/18 2034 04/20/18 0425  BP: 135/79 106/66  Pulse: (!) 56 70  Resp:    Temp: 97.8 F (36.6 C) 98 F (36.7 C)  SpO2: 99% 94%    Recent laboratory studies:  Lab Results  Component Value Date   HGB 13.4 04/07/2018   HGB 10.1 (L) 07/08/2017   HGB 10.8 (L) 07/07/2017   Lab Results  Component Value Date   WBC 5.1 04/07/2018   PLT 233 04/07/2018   Lab Results  Component Value Date   INR 1.30 03/12/2016   Lab Results  Component Value Date   NA 144 04/07/2018   K 3.4 (L) 04/07/2018   CL 109 04/07/2018   CO2 26 04/07/2018   BUN 14  04/07/2018   CREATININE 0.86 04/07/2018   GLUCOSE 84 04/07/2018    Discharge Medications:   Allergies as of 04/20/2018      Reactions   Bee Venom Swelling, Rash, Other (See Comments)   Patient is allergic to bee venom, mosquitos, wasps, yellow jackets and bees. Localized swelling Fever     Hymenoptera Venom Preparations Rash, Other (See Comments)   Patient is allergic to bee venom, mosquitos, wasps, yellow jackets and bees. Localized swelling Fever   Other Anaphylaxis, Shortness Of Breath, Itching, Rash   HICKORY SMOKE   Penicillins Anaphylaxis, Other (See Comments)   PATIENT HAS HAD A PCN REACTION WITH IMMEDIATE RASH, FACIAL/TONGUE/THROAT SWELLING, SOB, OR LIGHTHEADEDNESS WITH HYPOTENSION:  #  #  #  YES  #  #  #   Has  patient had a PCN reaction causing severe rash involving mucus membranes or skin necrosis: NO Has patient had a PCN reaction that required hospitalization NO Has patient had a PCN reaction occurring within the last 10 years: NO   Food Other (See Comments)   TANGERINES UNSPECIFIED CHILDHOOD REACTION    Latex Rash   Latex rash due to elastic in zip-up TED hose at home (local rash), pt. Has had flu vaccine before without problems.   Morphine And Related Itching   Plastibase Rash   Tangerine Flavor Rash      Medication List    TAKE these medications   amLODipine 5 MG tablet Commonly known as:  NORVASC Take 5 mg by mouth at bedtime.   CALTRATE 600+D PO Take 1 tablet by mouth daily.   fluticasone 0.05 % cream Commonly known as:  CUTIVATE Apply 1 application topically daily as needed (applied to dry spots on face).   levothyroxine 50 MCG tablet Commonly known as:  SYNTHROID, LEVOTHROID Take 50 mcg by mouth daily before breakfast.   multivitamin with minerals Tabs tablet Take 1 tablet by mouth 2 (two) times daily.   omeprazole 40 MG capsule Commonly known as:  PRILOSEC Take 40 mg by mouth at bedtime.   protein supplement shake Liqd Commonly known as:  PREMIER PROTEIN Take 11 oz by mouth daily as needed (for meal replacement). Chocolate   pseudoephedrine 30 MG tablet Commonly known as:  SUDAFED Take 30 mg by mouth every 6 (six) hours as needed (for allergies/congestion).   ranitidine 150 MG tablet Commonly known as:  ZANTAC Take 150 mg by mouth daily.   ursodiol 250 MG tablet Commonly known as:  ACTIGALL Take 250 mg by mouth 2 (two) times daily.   Vitamin D3 5000 units Tabs Take 5,000 Units by mouth daily.       Diagnostic Studies: Dg Knee Left Port  Result Date: 04/19/2018 CLINICAL DATA:  Osteoarthritis of the left knee. EXAM: PORTABLE LEFT KNEE - 1-2 VIEW COMPARISON:  None. FINDINGS: Left total knee prosthesis appears in excellent position. Postsurgical air is  expected. No fractures. IMPRESSION: Satisfactory appearance of the left knee after total knee replacement. Electronically Signed   By: Lorriane Shire M.D.   On: 04/19/2018 10:40   Mm 3d Screen Breast Bilateral  Result Date: 04/13/2018 CLINICAL DATA:  Screening. EXAM: DIGITAL SCREENING BILATERAL MAMMOGRAM WITH TOMO AND CAD COMPARISON:  Previous exam(s). ACR Breast Density Category b: There are scattered areas of fibroglandular density. FINDINGS: There are no findings suspicious for malignancy. Images were processed with CAD. IMPRESSION: No mammographic evidence of malignancy. A result letter of this screening mammogram will be mailed directly to the patient. RECOMMENDATION: Screening mammogram  in one year. (Code:SM-B-01Y) BI-RADS CATEGORY  1: Negative. Electronically Signed   By: Ammie Ferrier M.D.   On: 04/13/2018 16:15    Disposition:     Follow-up Information    Renette Butters, MD Follow up.   Specialty:  Orthopedic Surgery Contact information: [address] [place] [address] [phone]            Signed: Prudencio Burly III PA-C 04/20/2018, 7:32 AM

## 2018-04-20 NOTE — Progress Notes (Signed)
Subjective: Patient reports pain as moderate, controlled.  Tolerating diet.  Urinating.   No CP, SOB.  OOB mobilizing well with therapy and nursing.  Objective:   VITALS:   Vitals:   04/19/18 1500 04/19/18 1529 04/19/18 2034 04/20/18 0425  BP:  (!) 150/125 135/79 106/66  Pulse:  75 (!) 56 70  Resp:  16    Temp: (!) 97.2 F (36.2 C) 98.2 F (36.8 C) 97.8 F (36.6 C) 98 F (36.7 C)  TempSrc:  Oral Oral Oral  SpO2:  94% 99% 94%  Weight:      Height:       CBC Latest Ref Rng & Units 04/07/2018 07/08/2017 07/07/2017  WBC 4.0 - 10.5 K/uL 5.1 7.6 8.1  Hemoglobin 12.0 - 15.0 g/dL 13.4 10.1(L) 10.8(L)  Hematocrit 36.0 - 46.0 % 41.3 31.3(L) 31.5(L)  Platelets 150 - 400 K/uL 233 173 167   BMP Latest Ref Rng & Units 04/07/2018 06/25/2017 04/22/2017  Glucose 70 - 99 mg/dL 84 84 90  BUN 8 - 23 mg/dL 14 11 7   Creatinine 0.44 - 1.00 mg/dL 0.86 0.71 0.75  Sodium 135 - 145 mmol/L 144 140 143  Potassium 3.5 - 5.1 mmol/L 3.4(L) 3.6 3.4(L)  Chloride 98 - 111 mmol/L 109 106 107  CO2 22 - 32 mmol/L 26 25 28   Calcium 8.9 - 10.3 mg/dL 9.2 9.2 9.3   Intake/Output      08/06 0701 - 08/07 0700 08/07 0701 - 08/08 0700   P.O. 480    I.V. (mL/kg) 1658 (22.3)    IV Piggyback 100    Total Intake(mL/kg) 2238 (30.1)    Urine (mL/kg/hr) 400 (0.2)    Blood 20    Total Output 420    Net +1818         Urine Occurrence 3 x       Physical Exam: General: NAD.  Upright in bed on arrival.  Calm, conversant.  CPM in place. Resp: No increased wob Cardio: regular rate and rhythm ABD soft Neurologically intact MSK Neurovascularly intact Sensation intact distally Intact pulses distally Dorsiflexion/Plantar flexion intact Incision: dressing C/D/I   Assessment: 1 Day Post-Op  S/P Procedure(s) (LRB): LEFT TOTAL KNEE ARTHROPLASTY (Left) by Dr. Ernesta Amble. [name] on 04/19/2018  Principal Problem:   Primary osteoarthritis of left knee Active Problems:   Thyroid activity decreased   Left shoulder  pain   GERD (gastroesophageal reflux disease)   History of DVT (deep vein thrombosis)   History of pulmonary embolus (PE)   Primary osteoarthritis of knee   Primary osteoarthritis, status post left total knee arthroplasty Doing well postop day 1 Eating, drinking, and voiding Pain controlled Good early mobilization with therapy and nursing  Plan: Up with therapy Incentive Spirometry Elevate and Apply ice CPM, bone foam  Weight Bearing: Weight Bearing as Tolerated (WBAT) LLE. Dressings: Maintain Mepilex.  Please apply appropriate sized thigh high TED hose to operative leg prior to discharge. VTE prophylaxis: Xarelto, SCDs, ambulation Dispo: Home today   Patient's anticipated LOS is less than 2 midnights, meeting these requirements: - Younger than 7 - Lives within 1 hour of care - Has a competent adult at home to recover with post-op recover - NO history of  - Chronic pain requiring opiods  - Diabetes  - Coronary Artery Disease  - Heart failure  - Heart attack  - Stroke  - Cardiac arrhythmia  - Respiratory Failure/COPD  - Renal failure  - Anemia  - Advanced Liver disease  Charna Elizabeth Martensen III, PA-C 04/20/2018, 7:28 AM

## 2018-05-19 ENCOUNTER — Ambulatory Visit (HOSPITAL_COMMUNITY)
Admission: RE | Admit: 2018-05-19 | Discharge: 2018-05-19 | Disposition: A | Discharge status: Home / Self Care | Payer: Medicare HMO | Source: Ambulatory Visit | Attending: Cardiovascular Disease | Admitting: Cardiovascular Disease

## 2018-05-19 ENCOUNTER — Other Ambulatory Visit (HOSPITAL_COMMUNITY): Payer: Self-pay | Admitting: Orthopedic Surgery

## 2018-05-19 DIAGNOSIS — M7989 Other specified soft tissue disorders: Secondary | ICD-10-CM

## 2018-05-19 DIAGNOSIS — M79662 Pain in left lower leg: Secondary | ICD-10-CM | POA: Diagnosis present

## 2018-06-26 ENCOUNTER — Encounter (HOSPITAL_COMMUNITY): Payer: Self-pay | Admitting: Emergency Medicine

## 2018-06-26 ENCOUNTER — Emergency Department (HOSPITAL_COMMUNITY): Payer: Medicare HMO

## 2018-06-26 ENCOUNTER — Emergency Department (HOSPITAL_COMMUNITY)
Admission: EM | Admit: 2018-06-26 | Discharge: 2018-06-26 | Disposition: A | Discharge status: Home / Self Care | Payer: Medicare HMO | Attending: Emergency Medicine | Admitting: Emergency Medicine

## 2018-06-26 ENCOUNTER — Other Ambulatory Visit: Payer: Self-pay

## 2018-06-26 ENCOUNTER — Emergency Department (HOSPITAL_BASED_OUTPATIENT_CLINIC_OR_DEPARTMENT_OTHER): Payer: Medicare HMO

## 2018-06-26 DIAGNOSIS — I10 Essential (primary) hypertension: Secondary | ICD-10-CM | POA: Diagnosis not present

## 2018-06-26 DIAGNOSIS — Z91040 Latex allergy status: Secondary | ICD-10-CM | POA: Insufficient documentation

## 2018-06-26 DIAGNOSIS — R51 Headache: Secondary | ICD-10-CM | POA: Insufficient documentation

## 2018-06-26 DIAGNOSIS — E039 Hypothyroidism, unspecified: Secondary | ICD-10-CM | POA: Diagnosis not present

## 2018-06-26 DIAGNOSIS — Y92410 Unspecified street and highway as the place of occurrence of the external cause: Secondary | ICD-10-CM | POA: Insufficient documentation

## 2018-06-26 DIAGNOSIS — S161XXA Strain of muscle, fascia and tendon at neck level, initial encounter: Secondary | ICD-10-CM | POA: Diagnosis not present

## 2018-06-26 DIAGNOSIS — R52 Pain, unspecified: Secondary | ICD-10-CM

## 2018-06-26 DIAGNOSIS — M25562 Pain in left knee: Secondary | ICD-10-CM | POA: Diagnosis not present

## 2018-06-26 DIAGNOSIS — Y9389 Activity, other specified: Secondary | ICD-10-CM | POA: Diagnosis not present

## 2018-06-26 DIAGNOSIS — M549 Dorsalgia, unspecified: Secondary | ICD-10-CM | POA: Diagnosis not present

## 2018-06-26 DIAGNOSIS — Z79899 Other long term (current) drug therapy: Secondary | ICD-10-CM | POA: Diagnosis not present

## 2018-06-26 DIAGNOSIS — Z8541 Personal history of malignant neoplasm of cervix uteri: Secondary | ICD-10-CM | POA: Diagnosis not present

## 2018-06-26 DIAGNOSIS — Z87891 Personal history of nicotine dependence: Secondary | ICD-10-CM | POA: Diagnosis not present

## 2018-06-26 DIAGNOSIS — S199XXA Unspecified injury of neck, initial encounter: Secondary | ICD-10-CM | POA: Diagnosis present

## 2018-06-26 DIAGNOSIS — Y999 Unspecified external cause status: Secondary | ICD-10-CM | POA: Insufficient documentation

## 2018-06-26 MED ORDER — ORPHENADRINE CITRATE ER 100 MG PO TB12: 100.0000 mg | ORAL_TABLET | Freq: Two times a day (BID) | ORAL | 0 refills | Status: AC

## 2018-06-26 MED ORDER — ACETAMINOPHEN 500 MG PO TABS
1000.0000 mg | ORAL_TABLET | Freq: Once | ORAL | Status: AC
  Administered 2018-06-26: 1000 mg via ORAL
  Filled 2018-06-26: qty 2
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
## 2018-06-26 NOTE — ED Notes (Signed)
Patient transported to X-ray 
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
## 2018-06-26 NOTE — ED Notes (Signed)
Bed: WA08 Expected date:  Expected time:  Means of arrival:  Comments: Hold for Fast track 9

## 2018-06-26 NOTE — Progress Notes (Signed)
*  Preliminary Results* Left lower extremity venous duplex completed. Left lower extremity is positive for chronic deep vein thrombosis in the femoral vein and popliteal vein. There is no evidence of left Baker's cyst.  06/26/2018 4:10 PM  [name] Erin Vasquez

## 2018-07-15 DIAGNOSIS — M542 Cervicalgia: Secondary | ICD-10-CM | POA: Insufficient documentation

## 2018-08-18 ENCOUNTER — Other Ambulatory Visit: Payer: Self-pay | Admitting: Orthopedic Surgery

## 2018-08-18 DIAGNOSIS — M75102 Unspecified rotator cuff tear or rupture of left shoulder, not specified as traumatic: Secondary | ICD-10-CM

## 2018-08-31 ENCOUNTER — Ambulatory Visit
Admission: RE | Admit: 2018-08-31 | Discharge: 2018-08-31 | Disposition: A | Discharge status: Home / Self Care | Payer: Medicare HMO | Source: Ambulatory Visit | Attending: Orthopedic Surgery | Admitting: Orthopedic Surgery

## 2018-08-31 DIAGNOSIS — M75102 Unspecified rotator cuff tear or rupture of left shoulder, not specified as traumatic: Secondary | ICD-10-CM

## 2018-09-05 ENCOUNTER — Ambulatory Visit (INDEPENDENT_AMBULATORY_CARE_PROVIDER_SITE_OTHER): Payer: Medicare HMO | Admitting: Podiatry

## 2018-09-05 ENCOUNTER — Encounter: Payer: Self-pay | Admitting: Podiatry

## 2018-09-05 ENCOUNTER — Ambulatory Visit (INDEPENDENT_AMBULATORY_CARE_PROVIDER_SITE_OTHER): Payer: Medicare HMO

## 2018-09-05 DIAGNOSIS — M2011 Hallux valgus (acquired), right foot: Secondary | ICD-10-CM

## 2018-09-05 NOTE — Progress Notes (Signed)
Subjective:    Patient ID: Erin Vasquez, female    DOB: 06/08/1956, 62 y.o.   MRN: [id number]  HPI 62 year old female presents the office today for concerns of a bunion on her right foot which is been ongoing since 2003 and is becoming gradually more painful.  She has tried changing shoes and offloading any significant improvement in this point she wants to consider surgical intervention for the bunion on the right side.  She previously had bunion surgery in her left foot and this has been doing well but the left foot was not as severe as the right foot.  She has no other concerns today.   Review of Systems  All other systems reviewed and are negative.  Past Medical History:  Diagnosis Date  . Anemia    during pregnancy  . Anxiety   . Arthritis    "all over" (07/07/2017)  . Bilateral pulmonary embolism ([place]) 10/29/2015   "from my left ankle"  . Childhood asthma    as child, nothing as adult  . Chronic back pain    "all over" (07/07/2017)  . DVT (deep venous thrombosis) ([place]) 10/29/2015   "from my left ankle"  . GERD (gastroesophageal reflux disease)    tx. Tums  . H/O hiatal hernia    "repaired w/gastric bypass OR" (07/07/2017)  . History of esophageal dilatation   . Hypertension   . Hypothyroidism   . Lupus anticoagulant disorder ([facility])    Dr. Alen Blew   . Neuromuscular disorder ([place])    L thigh - Numbness, nerve damage    . Sacroiliac pain    "left thigh has been numb since 01/2016 after appendectomy" (07/07/2017)  . Sleep apnea    07/07/2017 "bariatric dr says I don't need CPAP anymore" (07/07/2017)  . Thyroid disease   . Uterine cancer ([place]) 01/2014  . Varicose veins   . Ventral hernia   . Vitamin D deficiency     Past Surgical History:  Procedure Laterality Date  . ABDOMINAL HYSTERECTOMY  01/2014   "robotic"  . APPENDECTOMY     ruptured 01/2016  . BALLOON DILATION N/A 08/02/2013   Procedure: BALLOON DILATION;  Surgeon: Arta Silence, MD;  [facility]: [facility];  Service: Endoscopy;  Laterality: N/A;  . BUNIONECTOMY WITH HAMMERTOE RECONSTRUCTION Left ~ 2005  . COLONOSCOPY WITH PROPOFOL N/A 08/02/2013   Procedure: COLONOSCOPY WITH PROPOFOL;  Surgeon: Arta Silence, MD;  [facility];  Service: Endoscopy;  Laterality: N/A;  . DILATION AND CURETTAGE OF UTERUS  x2   S/P miscarriage  . ESOPHAGOGASTRODUODENOSCOPY (EGD) WITH ESOPHAGEAL DILATION  ~ 2003  . ESOPHAGOGASTRODUODENOSCOPY (EGD) WITH PROPOFOL N/A 08/02/2013   Procedure: ESOPHAGOGASTRODUODENOSCOPY (EGD) WITH PROPOFOL;  Surgeon: Arta Silence, MD;  [facility];  Service: Endoscopy;  Laterality: N/A;  . HERNIA REPAIR  04/2017   [facility]  . INSERTION OF MESH N/A 04/28/2017   Procedure: POSSIBLE INSERTION OF MESH;  Surgeon: Coralie Keens, MD;  Location: [facility];  Service: General;  Laterality: N/A;  . JOINT REPLACEMENT    . LAPAROSCOPIC APPENDECTOMY N/A 02/07/2016   Procedure: LAPAROSCOPIC APPENDECTOMY;  Surgeon: Coralie Keens, MD;  Location: [facility];  Service: General;  Laterality: N/A;  . ROBOTIC ASSISTED TOTAL HYSTERECTOMY WITH BILATERAL SALPINGO OOPHERECTOMY  01/23/2014   uterine cancer  . ROUX-EN-Y GASTRIC BYPASS  11/2016   Cherrie Gauze  . SHOULDER ARTHROSCOPY WITH DEBRIDEMENT AND BICEP TENDON REPAIR Left 09/11/2014   Procedure: SHOULDER ARTHROSCOPY WITH DEBRIDEMENT AND BICEP TENDON REPAIR;  Surgeon: Christia Reading  Maryla Morrow, MD;  Location: [facility];  Service: Orthopedics;  Laterality: Left;  . SHOULDER ARTHROSCOPY WITH DISTAL CLAVICLE RESECTION Left 09/11/2014   Procedure: SHOULDER ARTHROSCOPY WITH DISTAL CLAVICLE RESECTION;  Surgeon: Renette Butters, MD;  Location: [facility];  Service: Orthopedics;  Laterality: Left;  . SHOULDER ARTHROSCOPY WITH SUBACROMIAL DECOMPRESSION Left 09/11/2014   Procedure: SHOULDER ARTHROSCOPY WITH SUBACROMIAL DECOMPRESSION;  Surgeon: Renette Butters, MD;  Location: [facility];  Service: Orthopedics;  Laterality: Left;  . TONSILLECTOMY    . TOTAL  KNEE ARTHROPLASTY Right 07/06/2017  . TOTAL KNEE ARTHROPLASTY Right 07/06/2017   Procedure: RIGHT TOTAL KNEE ARTHROPLASTY;  Surgeon: Renette Butters, MD;  Location: [facility];  Service: Orthopedics;  Laterality: Right;  . TOTAL KNEE ARTHROPLASTY Left 04/19/2018   Procedure: LEFT TOTAL KNEE ARTHROPLASTY;  Surgeon: Renette Butters, MD;  Location: [facility];  Service: Orthopedics;  Laterality: Left;  Marland Kitchen VENTRAL HERNIA REPAIR N/A 04/28/2017   Procedure: HERNIA REPAIR VENTRAL ADULT;  Surgeon: Coralie Keens, MD;  Location: [facility];  Service: General;  Laterality: N/A;     Current Outpatient Medications:  .  amLODipine (NORVASC) 5 MG tablet, Take 2.5 mg by mouth at bedtime. , Disp: , Rfl:  .  Calcium Carbonate-Vitamin D (CALTRATE 600+D PO), Take 1 tablet by mouth daily., Disp: , Rfl:  .  Cholecalciferol (VITAMIN D3) 5000 units TABS, Take 5,000 Units by mouth daily. , Disp: , Rfl:  .  levothyroxine (SYNTHROID, LEVOTHROID) 50 MCG tablet, Take 50 mcg by mouth daily before breakfast. , Disp: , Rfl:  .  Multiple Vitamin (MULTIVITAMIN WITH MINERALS) TABS tablet, Take 1 tablet by mouth 2 (two) times daily., Disp: , Rfl:  .  omeprazole (PRILOSEC) 40 MG capsule, Take 40 mg by mouth at bedtime. , Disp: , Rfl:  .  ranitidine (ZANTAC) 150 MG tablet, Take 150 mg by mouth every morning. , Disp: , Rfl:   Allergies  Allergen Reactions  . Bee Venom Swelling, Rash and Other (See Comments)    Patient is allergic to bee venom, mosquitos, wasps, yellow jackets and bees. Localized swelling Fever    . Hymenoptera Venom Preparations Rash and Other (See Comments)    Patient is allergic to bee venom, mosquitos, wasps, yellow jackets and bees. Localized swelling Fever  . Other Anaphylaxis, Shortness Of Breath, Itching and Rash    HICKORY SMOKE   . Penicillins Anaphylaxis and Other (See Comments)    PATIENT HAS HAD A PCN REACTION WITH IMMEDIATE RASH, FACIAL/TONGUE/THROAT SWELLING, SOB, OR LIGHTHEADEDNESS WITH HYPOTENSION:   #  #  #  YES  #  #  #   Has patient had a PCN reaction causing severe rash involving mucus membranes or skin necrosis: NO Has patient had a PCN reaction that required hospitalization NO Has patient had a PCN reaction occurring within the last 10 years: NO  . Food Other (See Comments)    TANGERINES UNSPECIFIED CHILDHOOD REACTION   . Latex Rash    Latex rash due to elastic in zip-up TED hose at home (local rash), pt. Has had flu vaccine before without problems.  . Morphine And Related Itching  . Plastibase Rash  . Tangerine Flavor Rash         Objective:   Physical Exam  General: AAO x3, NAD  Dermatological: Skin is warm, dry and supple bilateral. Nails x 10 are well manicured; remaining integument appears unremarkable at this time. There are no open sores, no preulcerative lesions, no rash or signs of  infection present.  Vascular: Dorsalis Pedis artery and Posterior Tibial artery pedal pulses are 2/4 bilateral with immedate capillary fill time. Pedal hair growth present. No varicosities and no lower extremity edema present bilateral. There is no pain with calf compression, swelling, warmth, erythema.   Neruologic: Grossly intact via light touch bilateralProtective threshold with Semmes Wienstein monofilament intact to all pedal sites bilateral.   Musculoskeletal: Significant bunion deformities present on the right foot and there is mild erythema on the medial aspect the first metatarsal head from where it is rubbing inside shoes.  There is no increase in warmth there is no drainage or any skin breakdown.  There is first ray hypermobility present.  There is no other areas of tenderness elicited bilaterally.  Muscular strength 5/5 in all groups tested bilateral.  Gait: Unassisted, Nonantalgic.      Assessment & Plan:  62 year old female with severe bunion deformity right foot -Treatment options discussed including all alternatives, risks, and complications -Etiology of symptoms were  discussed -X-rays were obtained and reviewed.  Severe bunion deformities present.  No evidence of acute fracture. -We discussed both conservative as well as surgical treatment options.  At this point she wants to proceed with surgery.  Discussed with her the severity of the bunion and she would likely need to have a Lapidus bunionectomy performed.  This is definitely on the control extremity but also has bunions she states is much worse.  We discussed the surgery as well as postoperative course.  She does not want this placed into the spring.  The meantime I will try to get medical clearance.  Also she looks to proceed with surgery and will check arterial studies prior to surgery to ensure adequate healing.We discussed the surgery and the postoperative course.   Trula Slade DPM

## 2018-09-05 NOTE — Patient Instructions (Signed)
Bunion  A bunion is a bump on the base of the big toe that forms when the bones of the big toe joint move out of position. Bunions may be small at first, but they often get larger over time. They can make walking painful. What are the causes? A bunion may be caused by:  Wearing narrow or pointed shoes that force the big toe to press against the other toes.  Abnormal foot development that causes the foot to roll inward (pronate).  Changes in the foot that are caused by certain diseases, such as rheumatoid arthritis or polio.  A foot injury. What increases the risk? The following factors may make you more likely to develop this condition:  Wearing shoes that squeeze the toes together.  Having certain diseases, such as: ? Rheumatoid arthritis. ? Polio. ? Cerebral palsy.  Having family members who have bunions.  Being born with a foot deformity, such as flat feet or low arches.  Doing activities that put a lot of pressure on the feet, such as ballet dancing. What are the signs or symptoms? The main symptom of a bunion is a noticeable bump on the big toe. Other symptoms may include:  Pain.  Swelling around the big toe.  Redness and inflammation.  Thick or hardened skin on the big toe or between the toes.  Stiffness or loss of motion in the big toe.  Trouble with walking. How is this diagnosed? A bunion may be diagnosed based on your symptoms, medical history, and activities. You may have tests, such as:  X-rays. These allow your health care provider to check the position of the bones in your foot and look for damage to your joint. They also help your health care provider determine the severity of your bunion and the best way to treat it.  Joint aspiration. In this test, a sample of fluid is removed from the toe joint. This test may be done if you are in a lot of pain. It helps rule out diseases that cause painful swelling of the joints, such as arthritis. How is this  treated? Treatment depends on the severity of your symptoms. The goal of treatment is to relieve symptoms and prevent the bunion from getting worse. Your health care provider may recommend:  Wearing shoes that have a wide toe box.  Using bunion pads to cushion the affected area.  Taping your toes together to keep them in a normal position.  Placing a device inside your shoe (orthotics) to help reduce pressure on your toe joint.  Taking medicine to ease pain, inflammation, and swelling.  Applying heat or ice to the affected area.  Doing stretching exercises.  Surgery to remove scar tissue and move the toes back into their normal position. This treatment is rare. Follow these instructions at home: Managing pain, stiffness, and swelling   If directed, put ice on the painful area: ? Put ice in a plastic bag. ? Place a towel between your skin and the bag. ? Leave the ice on for 20 minutes, 2-3 times a day. Activity   If directed, apply heat to the affected area before you exercise. Use the heat source that your health care provider recommends, such as a moist heat pack or a heating pad. ? Place a towel between your skin and the heat source. ? Leave the heat on for 20-30 minutes. ? Remove the heat if your skin turns bright red. This is especially important if you are unable to feel pain,   heat, or cold. You may have a greater risk of getting burned.  Do exercises as told by your health care provider. General instructions  Support your toe joint with proper footwear, shoe padding, or taping as told by your health care provider.  Take over-the-counter and prescription medicines only as told by your health care provider.  Keep all follow-up visits as told by your health care provider. This is important. Contact a health care provider if your symptoms:  Get worse.  Do not improve in 2 weeks. Get help right away if you have:  Severe pain and trouble with walking. Summary  A  bunion is a bump on the base of the big toe that forms when the bones of the big toe joint move out of position.  Bunions can make walking painful.  Treatment depends on the severity of your symptoms.  Support your toe joint with proper footwear, shoe padding, or taping as told by your health care provider. This information is not intended to replace advice given to you by your health care provider. Make sure you discuss any questions you have with your health care provider. Document Released: 08/31/2005 Document Revised: 01/11/2018 Document Reviewed: 01/11/2018 Elsevier Interactive Patient Education  2019 Elsevier Inc.  

## 2018-09-20 ENCOUNTER — Encounter: Payer: Self-pay | Admitting: Podiatry

## 2018-09-20 ENCOUNTER — Telehealth: Payer: Self-pay | Admitting: *Deleted

## 2018-09-20 ENCOUNTER — Ambulatory Visit (INDEPENDENT_AMBULATORY_CARE_PROVIDER_SITE_OTHER): Payer: Medicare HMO | Admitting: Podiatry

## 2018-09-20 VITALS — BP 143/86 | HR 63 | Resp 16

## 2018-09-20 DIAGNOSIS — M21621 Bunionette of right foot: Secondary | ICD-10-CM

## 2018-09-20 DIAGNOSIS — M2011 Hallux valgus (acquired), right foot: Secondary | ICD-10-CM

## 2018-09-20 DIAGNOSIS — M216X1 Other acquired deformities of right foot: Secondary | ICD-10-CM | POA: Diagnosis not present

## 2018-09-20 DIAGNOSIS — Z01818 Encounter for other preprocedural examination: Secondary | ICD-10-CM

## 2018-09-20 NOTE — Patient Instructions (Signed)
Pre-Operative Instructions  Congratulations, you have decided to take an important step towards improving your quality of life.  You can be assured that the doctors and staff at [facility] will be with you every step of the way.  Here are some important things you should know:  1. Plan to be at the surgery center/hospital at least 1 (one) hour prior to your scheduled time, unless otherwise directed by the surgical center/hospital staff.  You must have a responsible adult accompany you, remain during the surgery and drive you home.  Make sure you have directions to the surgical center/hospital to ensure you arrive on time. 2. If you are having surgery at [facility] or [facility] hospitals, you will need a copy of your medical history and physical form from your family physician within one month prior to the date of surgery. We will give you a form for your primary physician to complete.  3. We make every effort to accommodate the date you request for surgery.  However, there are times where surgery dates or times have to be moved.  We will contact you as soon as possible if a change in schedule is required.   4. No aspirin/ibuprofen for one week before surgery.  If you are on aspirin, any non-steroidal anti-inflammatory medications (Mobic, Aleve, Ibuprofen) should not be taken seven (7) days prior to your surgery.  You make take Tylenol for pain prior to surgery.  5. Medications - If you are taking daily heart and blood pressure medications, seizure, reflux, allergy, asthma, anxiety, pain or diabetes medications, make sure you notify the surgery center/hospital before the day of surgery so they can tell you which medications you should take or avoid the day of surgery. 6. No food or drink after midnight the night before surgery unless directed otherwise by surgical center/hospital staff. 7. No alcoholic beverages 24-hours prior to surgery.  No smoking 24-hours prior or 24-hours after  surgery. 8. Wear loose pants or shorts. They should be loose enough to fit over bandages, boots, and casts. 9. Don't wear slip-on shoes. Sneakers are preferred. 10. Bring your boot with you to the surgery center/hospital.  Also bring crutches or a walker if your physician has prescribed it for you.  If you do not have this equipment, it will be provided for you after surgery. 11. If you have not been contacted by the surgery center/hospital by the day before your surgery, call to confirm the date and time of your surgery. 12. Leave-time from work may vary depending on the type of surgery you have.  Appropriate arrangements should be made prior to surgery with your employer. 13. Prescriptions will be provided immediately following surgery by your doctor.  Fill these as soon as possible after surgery and take the medication as directed. Pain medications will not be refilled on weekends and must be approved by the doctor. 14. Remove nail polish on the operative foot and avoid getting pedicures prior to surgery. 15. Wash the night before surgery.  The night before surgery wash the foot and leg well with water and the antibacterial soap provided. Be sure to pay special attention to beneath the toenails and in between the toes.  Wash for at least three (3) minutes. Rinse thoroughly with water and dry well with a towel.  Perform this wash unless told not to do so by your physician.  Enclosed: 1 Ice pack (please put in freezer the night before surgery)   1 Hibiclens skin cleaner     Pre-op instructions  If you have any questions regarding the instructions, please do not hesitate to call our office.  [place]: [address], [place], [place] [address] -- [phone]  [place]: [address] [place], [place] [address] -- [phone]  [place]: [address]  [place], [place] [address] -- [phone]  [place]: [address], [address], [place], [place] [address] -- [phone]  Website:  [email or web address] 

## 2018-09-20 NOTE — Telephone Encounter (Signed)
Pt states [facility] is closed and needs the address of another lab. Pt states she found someone at [facility] and was told they have moved to another floor, and she will call again if there is a problem.

## 2018-09-21 ENCOUNTER — Telehealth: Payer: Self-pay | Admitting: Podiatry

## 2018-09-21 DIAGNOSIS — M2041 Other hammer toe(s) (acquired), right foot: Secondary | ICD-10-CM

## 2018-09-21 DIAGNOSIS — M2011 Hallux valgus (acquired), right foot: Secondary | ICD-10-CM

## 2018-09-21 DIAGNOSIS — M21541 Acquired clubfoot, right foot: Secondary | ICD-10-CM

## 2018-09-21 LAB — CBC WITH DIFFERENTIAL/PLATELET
ABSOLUTE MONOCYTES: 290 {cells}/uL (ref 200–950)
BASOS PCT: 0.9 %
Basophils Absolute: 59 cells/uL (ref 0–200)
EOS PCT: 2.1 %
Eosinophils Absolute: 139 cells/uL (ref 15–500)
HCT: 39.4 % (ref 35.0–45.0)
Hemoglobin: 13.4 g/dL (ref 11.7–15.5)
Lymphs Abs: 1789 cells/uL (ref 850–3900)
MCH: 31.1 pg (ref 27.0–33.0)
MCHC: 34 g/dL (ref 32.0–36.0)
MCV: 91.4 fL (ref 80.0–100.0)
MONOS PCT: 4.4 %
MPV: 10.8 fL (ref 7.5–12.5)
Neutro Abs: 4323 cells/uL (ref 1500–7800)
Neutrophils Relative %: 65.5 %
PLATELETS: 314 10*3/uL (ref 140–400)
RBC: 4.31 10*6/uL (ref 3.80–5.10)
RDW: 12.5 % (ref 11.0–15.0)
TOTAL LYMPHOCYTE: 27.1 %
WBC: 6.6 10*3/uL (ref 3.8–10.8)

## 2018-09-21 LAB — COMPLETE METABOLIC PANEL WITH GFR
AG Ratio: 1.9 (calc) (ref 1.0–2.5)
ALKALINE PHOSPHATASE (APISO): 63 U/L (ref 33–130)
ALT: 22 U/L (ref 6–29)
AST: 17 U/L (ref 10–35)
Albumin: 4.3 g/dL (ref 3.6–5.1)
BUN: 17 mg/dL (ref 7–25)
CO2: 29 mmol/L (ref 20–32)
CREATININE: 0.68 mg/dL (ref 0.50–0.99)
Calcium: 9.1 mg/dL (ref 8.6–10.4)
Chloride: 106 mmol/L (ref 98–110)
GFR, EST NON AFRICAN AMERICAN: 93 mL/min/{1.73_m2} (ref >=60)
GFR, Est African American: 108 mL/min/{1.73_m2} (ref >=60)
GLUCOSE: 78 mg/dL (ref 65–139)
Globulin: 2.3 g/dL (calc) (ref 1.9–3.7)
Potassium: 4 mmol/L (ref 3.5–5.3)
Sodium: 143 mmol/L (ref 135–146)
Total Bilirubin: 0.6 mg/dL (ref 0.2–1.2)
Total Protein: 6.6 g/dL (ref 6.1–8.1)

## 2018-09-21 LAB — VITAMIN D 25 HYDROXY (VIT D DEFICIENCY, FRACTURES): VIT D 25 HYDROXY: 38 ng/mL (ref 30–100)

## 2018-09-21 NOTE — Progress Notes (Signed)
Subjective: 63 year old female presents the office today for surgical consultation.  She states that she is continued pain on the bunion on the right side and because that she was going proceed with surgery in the near future and not waiting till the spring.  She is tried shoe modifications and offloading without any significant improvement.  She is considering her options and she wants to proceed with surgery.  She is also seen she is having quite a bit of pain more to the ulcer after the foot and she points no tenderness on each foot fixed.  Also thinking that if we do the bunion surgery the other toes are to start to realign.  The second toe is starting to go over the third toe.  Denies any systemic complaints such as fevers, chills, nausea, vomiting. No acute changes since last appointment, and no other complaints at this time.   Objective: AAO x3, NAD DP/PT pulses palpable bilaterally, CRT less than 3 seconds Severe bunion deformities present on the right foot.  There is no pain or crepitation with MPJ range of motion but there is first ray hypermobility present.  There is erythema on the bunion site, rubs inside shoes.  Second toe has a lateral deviation at the level of the MPJ.  Moderate tailor's bunion is also present and there is erythema on the lateral fifth metatarsal head.  There is tenderness palpation of this area but there is no other areas of pinpoint tenderness. No open lesions or pre-ulcerative lesions.  No pain with calf compression, swelling, warmth, erythema  Assessment: Right foot severe bunion deformity, tailor's bunion, second toe deviation  Plan: -All treatment options discussed with the patient including all alternatives, risks, complications.  -I again reviewed the x-rays with her we discussed both conservative as well as surgical treatment options.  At this time she wished to proceed with surgery given continuation of pain despite conservative care. -We discussed a Lapidus  bunionectomy as well as possible Akin osteotomy.  She like to have surgery to fix the tailor's bunion as well.  Discussed pros and cons of this.  We will plan for reverse Austin bunionectomy with screw fixation.  Also given the elongated second metatarsal and deviation of the toe will do a second metatarsal shortening osteotomy with pinning of the toe. -The incision placement as well as the postoperative course was discussed with the patient. I discussed risks of the surgery which include, but not limited to, infection, bleeding, pain, swelling, need for further surgery, delayed or nonhealing, painful or ugly scar, numbness or sensation changes, over/under correction, recurrence, transfer lesions, further deformity, hardware failure, DVT/PE, loss of toe/foot. Patient understands these risks and wishes to proceed with surgery. The surgical consent was reviewed with the patient all 3 pages were signed. No promises or guarantees were given to the outcome of the procedure. All questions were answered to the best of my ability. Before the surgery the patient was encouraged to call the office if there is any further questions. The surgery will be performed at the [facility] on an outpatient basis. -Order blood work for preoperative evaluation including vitamin D level, CBC, CMP -We will order venous duplex as well as arterial studies to be performed prior to surgery as well given her history. -We will also need medical clearance.  She will need to also be on DVT prophylaxis postoperatively.  She does not want to do the Lovenox and she wants to do oral medication.  Discussed this is not FDA approved  for foot surgery and she understands the risks. -Order knee scooter for postoperative use -We will also set up home health care for postop including home health and physical therapy. -Surgical boot dispensed for postoperative use. -Patient encouraged to call the office with any questions, concerns, change in symptoms.    Trula Slade DPM

## 2018-09-21 NOTE — Telephone Encounter (Signed)
Pt called stating that paperwork was being sent to her PCP but they have not received it yet. Pt is calling to follow up and make sure that we have the correct fax#  [phone] Attn: Colletta [place]

## 2018-09-22 NOTE — Telephone Encounter (Addendum)
"  I am trying to get my surgery scheduled for January 22.  Dr. Jacqualyn Posey said I needed to get clearance from Dr. Doreene Nest.  I called his office and they said they have not received anything yet."  Yes, Dr. Jacqualyn Posey wants medical clearance.  I will take care of it.  "When you send it can you put it to the attention of Stephanie.  She will get it to Dr. Doreene Nest.  The fax number is [phone].  The reason I'm pushing this is because I am going on a cruise in April and I want to be able to walk.  He said I was going to be non-weight bearing for a while.  Will you be sending that today?"  It will be today or tomorrow.  I sent an order for a knee scooter to [facility].  They will contact the patient to make arrangements.  I entered orders for a Venous Doppler with ABI.  I also put in an order for [facility].  [facility] is not in network.  Per Dr. Jacqualyn Posey I sent a medical clearance letter to Dr. Doreene Nest.

## 2018-09-22 NOTE — Telephone Encounter (Signed)
I left the patient a message to call me on tomorrow.  I want to make sure she's aware that Dr. Jacqualyn Posey wants her to have a Venous Duplex and ABI test prior to having surgery.

## 2018-09-22 NOTE — Addendum Note (Signed)
Addended by: Lolita Rieger on: 09/22/2018 07:25 PM   Modules accepted: Orders

## 2018-09-23 ENCOUNTER — Telehealth: Payer: Self-pay | Admitting: *Deleted

## 2018-09-23 NOTE — Telephone Encounter (Signed)
"  You called me last night and asked me to call you this morning."  Yes, Dr. Jacqualyn Posey wants you to have a Venous Doppler with ABIs performed at [facility], they're located in the building where [facility] is located in [facility].  I have sent them the orders.  You should receive a call from them to schedule your appointment or you can call them.  Also, I put in an order for a knee scooter.  You should get a call from [facility].  [facility] agency will also be giving you a call to set up your home health care assistance for after surgery.  "What's the phone number to the place to have the Venous doppler?"  It's [phone].

## 2018-09-26 ENCOUNTER — Ambulatory Visit (HOSPITAL_BASED_OUTPATIENT_CLINIC_OR_DEPARTMENT_OTHER)
Admission: RE | Admit: 2018-09-26 | Discharge: 2018-09-26 | Disposition: A | Discharge status: Home / Self Care | Payer: Medicare HMO | Source: Ambulatory Visit | Attending: Cardiology | Admitting: Cardiology

## 2018-09-26 ENCOUNTER — Other Ambulatory Visit: Payer: Self-pay | Admitting: *Deleted

## 2018-09-26 ENCOUNTER — Ambulatory Visit (HOSPITAL_COMMUNITY)
Admission: RE | Admit: 2018-09-26 | Discharge: 2018-09-26 | Disposition: A | Discharge status: Home / Self Care | Payer: Medicare HMO | Source: Ambulatory Visit | Attending: Cardiology | Admitting: Cardiology

## 2018-09-26 ENCOUNTER — Other Ambulatory Visit: Payer: Self-pay | Admitting: Podiatry

## 2018-09-26 ENCOUNTER — Ambulatory Visit (HOSPITAL_COMMUNITY): Payer: Medicare HMO

## 2018-09-26 ENCOUNTER — Encounter

## 2018-09-26 DIAGNOSIS — M2011 Hallux valgus (acquired), right foot: Secondary | ICD-10-CM | POA: Diagnosis not present

## 2018-09-26 DIAGNOSIS — M2041 Other hammer toe(s) (acquired), right foot: Secondary | ICD-10-CM | POA: Diagnosis present

## 2018-09-26 DIAGNOSIS — I82401 Acute embolism and thrombosis of unspecified deep veins of right lower extremity: Secondary | ICD-10-CM | POA: Diagnosis present

## 2018-09-26 DIAGNOSIS — M21541 Acquired clubfoot, right foot: Secondary | ICD-10-CM

## 2018-09-27 ENCOUNTER — Telehealth: Payer: Self-pay | Admitting: *Deleted

## 2018-09-27 NOTE — Telephone Encounter (Signed)
"  I'm calling to see if you have received my clearance from Dr. Doreene Nest.  I also have not heard from [facility] nor [facility] about the home health care and the knee scooter.  I had the studies done on yesterday."  We have not received the clearance from Dr. Doreene Nest at this time.  [facility] should contact you about the scooter.  We are still looking for someone for Archie did not have anyone available to assist you at this time.  [facility] and [facility] are not in-network with your insurance.  I have put in a request with [facility] to see if they can assist you, I am waiting on a response.  "Okay, I'll call Dr. Rayetta Pigg office now to see what the hold up is."  I called Dr. Rayetta Pigg office and spoke to Raymond.  She said that Dr. Doreene Nest wants to see me for my appointment on Friday before she gives the clearance.  I will give her until 1 pm Friday to see if she has sent you the clearance.  If not, I'll give her a call."

## 2018-09-29 ENCOUNTER — Encounter (HOSPITAL_COMMUNITY): Payer: Medicare HMO

## 2018-09-30 DIAGNOSIS — Z01818 Encounter for other preprocedural examination: Secondary | ICD-10-CM | POA: Insufficient documentation

## 2018-10-04 ENCOUNTER — Telehealth: Payer: Self-pay | Admitting: Podiatry

## 2018-10-04 ENCOUNTER — Other Ambulatory Visit: Payer: Self-pay | Admitting: *Deleted

## 2018-10-04 DIAGNOSIS — I82462 Acute embolism and thrombosis of left calf muscular vein: Secondary | ICD-10-CM

## 2018-10-04 DIAGNOSIS — R0989 Other specified symptoms and signs involving the circulatory and respiratory systems: Secondary | ICD-10-CM

## 2018-10-04 DIAGNOSIS — Z9889 Other specified postprocedural states: Secondary | ICD-10-CM

## 2018-10-04 NOTE — Telephone Encounter (Signed)
I called and spoke to the patient. Due to non compressible vessels on x-ray we are going to do arterial duplex. She also reports increase pain in the left calf. Will order venous duplex. Will cancel surgery for tomorrow and reschedule for next week. She had no further questions or concerns and thanked me for the call.

## 2018-10-04 NOTE — Telephone Encounter (Signed)
We received medical clearance from Dr. Doreene Nest.

## 2018-10-04 NOTE — Progress Notes (Unsigned)
Per Dr. Jacqualyn Posey, I placed an order for physical therapy, lower arterial duplex of the right, and a lower venous duplex of the left leg.  I informed

## 2018-10-04 NOTE — Telephone Encounter (Signed)
Pt is returning Dr. Leigh Aurora Phone call, she states he called after 5pm and she called right back but no answer. She has surgery tomorrow. Please call patient

## 2018-10-05 ENCOUNTER — Ambulatory Visit (HOSPITAL_COMMUNITY)
Admission: RE | Admit: 2018-10-05 | Discharge: 2018-10-05 | Disposition: A | Discharge status: Home / Self Care | Payer: Medicare HMO | Source: Ambulatory Visit | Attending: Cardiology | Admitting: Cardiology

## 2018-10-05 DIAGNOSIS — I82462 Acute embolism and thrombosis of left calf muscular vein: Secondary | ICD-10-CM | POA: Insufficient documentation

## 2018-10-06 ENCOUNTER — Telehealth: Payer: Self-pay | Admitting: *Deleted

## 2018-10-06 NOTE — Telephone Encounter (Signed)
I informed pt of Dr. Wagoner's review of results. 

## 2018-10-06 NOTE — Telephone Encounter (Signed)
-----   Message from Trula Slade, DPM sent at 10/06/2018  7:06 AM EST ----- Please let her know that the venous duplex is unchanged. Thanks.

## 2018-10-10 ENCOUNTER — Other Ambulatory Visit: Payer: BC Managed Care – PPO

## 2018-10-10 ENCOUNTER — Ambulatory Visit (HOSPITAL_COMMUNITY)
Admission: RE | Admit: 2018-10-10 | Discharge: 2018-10-10 | Disposition: A | Discharge status: Home / Self Care | Payer: Medicare HMO | Source: Ambulatory Visit | Attending: Cardiovascular Disease | Admitting: Cardiovascular Disease

## 2018-10-10 DIAGNOSIS — R0989 Other specified symptoms and signs involving the circulatory and respiratory systems: Secondary | ICD-10-CM | POA: Diagnosis present

## 2018-10-11 ENCOUNTER — Other Ambulatory Visit: Payer: Self-pay | Admitting: Podiatry

## 2018-10-11 ENCOUNTER — Telehealth: Payer: Self-pay | Admitting: Podiatry

## 2018-10-11 ENCOUNTER — Telehealth: Payer: Self-pay | Admitting: *Deleted

## 2018-10-11 MED ORDER — CLINDAMYCIN HCL 300 MG PO CAPS: 300.0000 mg | ORAL_CAPSULE | Freq: Three times a day (TID) | ORAL | 0 refills | Status: DC

## 2018-10-11 MED ORDER — OXYCODONE-ACETAMINOPHEN 5-325 MG PO TABS: 1.0000 | ORAL_TABLET | Freq: Four times a day (QID) | ORAL | 0 refills | Status: DC | PRN

## 2018-10-11 MED ORDER — ENOXAPARIN SODIUM 40 MG/0.4ML ~~LOC~~ SOLN: 40.0000 mg | SUBCUTANEOUS | 0 refills | Status: DC

## 2018-10-11 MED ORDER — OXYCODONE-ACETAMINOPHEN 5-325 MG PO TABS: 2.0000 | ORAL_TABLET | Freq: Four times a day (QID) | ORAL | 0 refills | Status: DC | PRN

## 2018-10-11 MED ORDER — PROMETHAZINE HCL 25 MG PO TABS: 25.0000 mg | ORAL_TABLET | Freq: Three times a day (TID) | ORAL | 0 refills | Status: DC | PRN

## 2018-10-11 NOTE — Progress Notes (Signed)
Postop medications sent to the pharmacy. Confirmed that she is not allergic to the medications I prescribed.

## 2018-10-11 NOTE — Telephone Encounter (Signed)
I am returning your call.  Dr. Jacqualyn Posey received your results and he said everything looks good.  He wanted me to ask you about Lovenox.  Have you ever given yourself the Lovenox injections?  "Yes, I have."  He's going to send a prescription to your pharmacy.  "What about any other medications, like pain medication?"  Usually he prescribes that the day of surgery.  "My surgery starts at 7 am and I'm supposed to be there at 5:30 am correct?"  If that is what they told you from the surgical center then that's the time you need to arrive.  They control the scheduling.    I'm calling you back.  I asked Dr. Jacqualyn Posey about the pain medication.  He said he would go ahead and send in all your prescriptions.  "That's great, that way I don't have to depend on my Fiance going to get them.  Thank you so much and God bless."

## 2018-10-11 NOTE — Telephone Encounter (Signed)
Sent to her pharmacy. 

## 2018-10-11 NOTE — Telephone Encounter (Signed)
I'm having surgery tomorrow 10/12/2018, I need to have RX changed to no more than 6 pills a day for Medicare to pay for it. I would like to have that sent to [facility] on [address].

## 2018-10-11 NOTE — Telephone Encounter (Signed)
"  I'm scheduled for surgery tomorrow.  I was supposed to have had it done last week but I had to cancel it because Dr. Jacqualyn Posey wanted to run some more test.  I have a history of blood clots.  So, I am calling to see if it's okay for me to have the surgery tomorrow."  I am not sure if he has received the results.  I will ask Dr. Jacqualyn Posey and see what he recommends.  I'll call you back and give you his response.

## 2018-10-12 ENCOUNTER — Other Ambulatory Visit: Payer: Self-pay | Admitting: Podiatry

## 2018-10-12 ENCOUNTER — Encounter: Payer: Self-pay | Admitting: Podiatry

## 2018-10-12 DIAGNOSIS — M21541 Acquired clubfoot, right foot: Secondary | ICD-10-CM

## 2018-10-12 DIAGNOSIS — M2041 Other hammer toe(s) (acquired), right foot: Secondary | ICD-10-CM

## 2018-10-12 DIAGNOSIS — M2011 Hallux valgus (acquired), right foot: Secondary | ICD-10-CM | POA: Diagnosis not present

## 2018-10-12 MED ORDER — OXYCODONE-ACETAMINOPHEN 10-325 MG PO TABS: 1.0000 | ORAL_TABLET | Freq: Four times a day (QID) | ORAL | 0 refills | Status: DC | PRN

## 2018-10-12 NOTE — Progress Notes (Signed)
Talked to pharmacist and insurance will not approve more than 6 pills a day. Re-sent pain medications

## 2018-10-14 ENCOUNTER — Telehealth: Payer: Self-pay | Admitting: *Deleted

## 2018-10-14 DIAGNOSIS — G44209 Tension-type headache, unspecified, not intractable: Secondary | ICD-10-CM | POA: Insufficient documentation

## 2018-10-14 DIAGNOSIS — H8113 Benign paroxysmal vertigo, bilateral: Secondary | ICD-10-CM | POA: Insufficient documentation

## 2018-10-14 NOTE — Telephone Encounter (Signed)
Called and spoke with patient and patient stated that she was doing ok and is going to neurologist today and has been elevating and I did not take medicine for at least 12 hours and I fell asleep and forgot to take and I stated to call the office if any concerns or questions at [phone]. Lattie Haw

## 2018-10-17 ENCOUNTER — Other Ambulatory Visit: Payer: Self-pay | Admitting: Sports Medicine

## 2018-10-17 ENCOUNTER — Telehealth: Payer: Self-pay | Admitting: *Deleted

## 2018-10-17 MED ORDER — OXYCODONE-ACETAMINOPHEN 10-325 MG PO TABS: 1.0000 | ORAL_TABLET | Freq: Four times a day (QID) | ORAL | 0 refills | Status: AC | PRN

## 2018-10-17 NOTE — Telephone Encounter (Signed)
I called pt and informed of the Dr. Leeanne Rio orders.

## 2018-10-17 NOTE — Telephone Encounter (Signed)
Pt states she just got home from surgery center and is calling concerning pain medication.

## 2018-10-17 NOTE — Progress Notes (Signed)
Refilled percocet. Advise patient NOT to mix muscle relaxants or any other sediatives with Pain medication -Dr. Cannon Kettle

## 2018-10-17 NOTE — Telephone Encounter (Signed)
I called pt and she states went to neurologist doctor who gave her a muscle relaxer to take once she has finished her pain medication from Dr. Jacqualyn Posey. Pt states the percocet that Dr. Jacqualyn Posey ordered is working, and she needs a refill today after 10:00am.  I told pt I would inform a doctor in office.

## 2018-10-17 NOTE — Telephone Encounter (Signed)
Refilled. Advise patient not to mix muscle relaxants or sedatives with pain medication it can cause her to stop breathing from being over sedated -Dr. Cannon Kettle

## 2018-10-18 ENCOUNTER — Ambulatory Visit (INDEPENDENT_AMBULATORY_CARE_PROVIDER_SITE_OTHER): Payer: BC Managed Care – PPO | Admitting: Sports Medicine

## 2018-10-18 ENCOUNTER — Encounter: Payer: Self-pay | Admitting: Sports Medicine

## 2018-10-18 ENCOUNTER — Ambulatory Visit (INDEPENDENT_AMBULATORY_CARE_PROVIDER_SITE_OTHER): Payer: BC Managed Care – PPO

## 2018-10-18 DIAGNOSIS — M2041 Other hammer toe(s) (acquired), right foot: Secondary | ICD-10-CM | POA: Diagnosis not present

## 2018-10-18 DIAGNOSIS — M21621 Bunionette of right foot: Secondary | ICD-10-CM

## 2018-10-18 DIAGNOSIS — Z9889 Other specified postprocedural states: Secondary | ICD-10-CM

## 2018-10-18 DIAGNOSIS — M2011 Hallux valgus (acquired), right foot: Secondary | ICD-10-CM

## 2018-10-18 NOTE — Progress Notes (Signed)
Subjective: Erin Vasquez is a 63 y.o. female patient seen today in office for POV # 1 dos 01.29.2020 Lapidus Procedure Including Bunionectomy Rt, Aiken Osteotomy Rt, Metatarsal Osteotomy 2nd Rt, Tailors Bunionectomy Rt with Dr. Jacqualyn Posey. Patient admits a little pain at surgical site, states that she fell yesterday, denies hitting her foot had her boot on, denies calf pain, denies headache, chest pain, shortness of breath, nausea, vomiting, fever, or chills. No other issues noted.   Patient Active Problem List   Diagnosis Date Noted  . Primary osteoarthritis of knee 04/19/2018  . Primary osteoarthritis of left knee 04/04/2018  . History of DVT (deep vein thrombosis) 06/21/2017  . History of pulmonary embolus (PE) 06/21/2017  . Primary osteoarthritis of right knee 06/21/2017  . Varicose veins of bilateral lower extremities with other complications 36/14/4315  . Acute perforated appendicitis   . AKI (acute kidney injury) ([place]) 02/08/2016  . Ruptured appendicitis 02/06/2016  . Hyponatremia 02/06/2016  . Sepsis, unspecified organism ([place]) 02/06/2016  . Dehydration 02/06/2016  . GERD (gastroesophageal reflux disease) 02/06/2016  . Pulmonary embolism ([place]) 10/29/2015  . Obese   . Pulmonary embolism, bilateral ([place])   . Thyroid activity decreased   . Arthritis   . Left shoulder pain     Current Outpatient Medications on File Prior to Visit  Medication Sig Dispense Refill  . amLODipine (NORVASC) 5 MG tablet Take 2.5 mg by mouth at bedtime.     . Calcium Carbonate-Vitamin D (CALTRATE 600+D PO) Take 1 tablet by mouth daily.    . Cholecalciferol (VITAMIN D3) 5000 units TABS Take 5,000 Units by mouth daily.     . clindamycin (CLEOCIN) 300 MG capsule Take 1 capsule (300 mg total) by mouth 3 (three) times daily. 30 capsule 0  . enoxaparin (LOVENOX) 40 MG/0.4ML injection Inject 0.4 mLs (40 mg total) into the skin daily. 14 Syringe 0  . levothyroxine (SYNTHROID, LEVOTHROID) 50 MCG tablet Take 50  mcg by mouth daily before breakfast.     . Multiple Vitamin (MULTIVITAMIN WITH MINERALS) TABS tablet Take 1 tablet by mouth 2 (two) times daily.    Marland Kitchen omeprazole (PRILOSEC) 40 MG capsule Take 40 mg by mouth at bedtime.     Marland Kitchen oxyCODONE-acetaminophen (PERCOCET) 10-325 MG tablet Take 1 tablet by mouth every 6 (six) hours as needed for up to 5 days for pain. MAXIMUM TOTAL ACETAMINOPHEN DOSE IS 4000 MG PER DAY 20 tablet 0  . promethazine (PHENERGAN) 25 MG tablet Take 1 tablet (25 mg total) by mouth every 8 (eight) hours as needed for nausea or vomiting. 20 tablet 0  . ranitidine (ZANTAC) 150 MG tablet Take 150 mg by mouth every morning.      No current facility-administered medications on file prior to visit.     Allergies  Allergen Reactions  . Bee Venom Swelling, Rash and Other (See Comments)    Patient is allergic to bee venom, mosquitos, wasps, yellow jackets and bees. Localized swelling Fever    . Hymenoptera Venom Preparations Rash and Other (See Comments)    Patient is allergic to bee venom, mosquitos, wasps, yellow jackets and bees. Localized swelling Fever  . Other Anaphylaxis, Shortness Of Breath, Itching and Rash    HICKORY SMOKE   . Penicillins Anaphylaxis and Other (See Comments)    PATIENT HAS HAD A PCN REACTION WITH IMMEDIATE RASH, FACIAL/TONGUE/THROAT SWELLING, SOB, OR LIGHTHEADEDNESS WITH HYPOTENSION:  #  #  #  YES  #  #  #   Has patient  had a PCN reaction causing severe rash involving mucus membranes or skin necrosis: NO Has patient had a PCN reaction that required hospitalization NO Has patient had a PCN reaction occurring within the last 10 years: NO  . Food Other (See Comments)    TANGERINES UNSPECIFIED CHILDHOOD REACTION   . Latex Rash    Latex rash due to elastic in zip-up TED hose at home (local rash), pt. Has had flu vaccine before without problems.  . Morphine And Related Itching  . Plastibase Rash  . Tangerine Flavor Rash    Objective: There were no vitals  filed for this visit.  General: No acute distress, AAOx3  Right foot: Kwire and Sutures intact with no gapping or dehiscence at surgical sites, mild swelling to right foot, no erythema, no warmth, no drainage, no signs of infection noted, Capillary fill time <3 seconds in all digits, sensation present via light touch, Mild guarding to right foot with range of motion.  No pain with calf compression.   Post Op Xray, Right foot: Kwire and staple/screws intact, Osteotomy sites in good alignment. Soft tissue swelling within normal limits for post op status.   Assessment and Plan:  Problem List Items Addressed This Visit    None    Visit Diagnoses    Post-operative state    -  Primary   Relevant Orders   DG Foot Complete Right (Completed)   Hallux valgus of right foot       Relevant Orders   DG Foot Complete Right (Completed)   Hammer toe of right foot       Relevant Orders   DG Foot Complete Right (Completed)   Tailor's bunion of right foot       Relevant Orders   DG Foot Complete Right (Completed)      -Patient seen and evaluated -Xrays reviewed  -Applied dry sterile dressing to surgical site right foot secured with ACE wrap and stockinet  -Advised patient to make sure to keep dressings clean, dry, and intact to right surgical site  -Advised patient to continue with CAM boot and nonweightbearing with use of scooter -Continue with PRN meds and antibiotics  -Advised patient to ice and elevate as necessary  -Will plan for post op follow up with Dr. Jacqualyn Posey at next office visit. In the meantime, patient to call office if any issues or problems arise.   Landis Martins, DPM

## 2018-10-27 ENCOUNTER — Ambulatory Visit (INDEPENDENT_AMBULATORY_CARE_PROVIDER_SITE_OTHER): Payer: Self-pay | Admitting: Podiatry

## 2018-10-27 DIAGNOSIS — Z9889 Other specified postprocedural states: Secondary | ICD-10-CM

## 2018-10-27 DIAGNOSIS — M2041 Other hammer toe(s) (acquired), right foot: Secondary | ICD-10-CM

## 2018-10-27 DIAGNOSIS — M2011 Hallux valgus (acquired), right foot: Secondary | ICD-10-CM

## 2018-10-27 MED ORDER — ENOXAPARIN SODIUM 40 MG/0.4ML ~~LOC~~ SOLN: 40.0000 mg | SUBCUTANEOUS | 0 refills | Status: DC

## 2018-11-07 NOTE — Progress Notes (Signed)
Subjective: Erin Vasquez is a 63 y.o. is seen today in office s/p right foot Lapidus, Akin bunionectomy, second metatarsal osteotomy, tailor's bunion correction preformed on 10/12/2018.  She states her pain is controlled.  She is been nonweightbearing using the cam boot. Denies any systemic complaints such as fevers, chills, nausea, vomiting. No calf pain, chest pain, shortness of breath.   Objective: General: No acute distress, AAOx3  DP/PT pulses palpable 2/4, CRT < 3 sec to all digits.  Protective sensation intact. Motor function intact.  RIGHT foot: Incision is well coapted without any evidence of dehiscence sutures are intact. There is no surrounding erythema, ascending cellulitis, fluctuance, crepitus, malodor, drainage/purulence. There is mild edema around the surgical site. There is no pain along the surgical site.  Toes are in rectus position.  No signs of infection incisions appear to be healing well.  K wire intact the second toe. No other areas of tenderness to bilateral lower extremities.  No other open lesions or pre-ulcerative lesions.  No pain with calf compression, swelling, warmth, erythema.   Assessment and Plan:  Status post right foot surgery, doing well with no complications   -Treatment options discussed including all alternatives, risks, and complications -I again reviewed the x-rays with her today. -Incision appears to be doing well.  Antibiotic ointment and a bandage was applied.  Keep dressing clean, dry, intact -Ice/elevation -Pain medication as needed. -Continue nonweightbearing in the cam boot. -Monitor for any clinical signs or symptoms of infection and DVT/PE and directed to call the office immediately should any occur or go to the ER. -Follow-up as scheduled for possible suture removal or sooner if any problems arise. In the meantime, encouraged to call the office with any questions, concerns, change in symptoms.   Celesta Gentile, DPM

## 2018-11-08 ENCOUNTER — Ambulatory Visit (INDEPENDENT_AMBULATORY_CARE_PROVIDER_SITE_OTHER): Payer: Medicare HMO

## 2018-11-08 ENCOUNTER — Ambulatory Visit (INDEPENDENT_AMBULATORY_CARE_PROVIDER_SITE_OTHER): Payer: Medicare HMO | Admitting: Podiatry

## 2018-11-08 DIAGNOSIS — M21621 Bunionette of right foot: Secondary | ICD-10-CM

## 2018-11-08 DIAGNOSIS — M2011 Hallux valgus (acquired), right foot: Secondary | ICD-10-CM | POA: Diagnosis not present

## 2018-11-08 DIAGNOSIS — M2041 Other hammer toe(s) (acquired), right foot: Secondary | ICD-10-CM

## 2018-11-08 DIAGNOSIS — Z9889 Other specified postprocedural states: Secondary | ICD-10-CM

## 2018-11-09 NOTE — Progress Notes (Signed)
Subjective: Erin Vasquez is a 63 y.o. is seen today in office s/p right foot Lapidus, Akin bunionectomy, second metatarsal osteotomy, tailor's bunion correction preformed on 10/12/2018.  Overall she states that she has been doing well.  She does admit that she has been putting weight on her foot but more to the heel only when wearing the cam boot.  She not taking any pain medicine for this currently she states that she is feeling well and she has no new concerns.  She denies any fevers, chills, nausea, vomiting.  No calf pain, chest pain, shortness of breath.   Objective: General: No acute distress, AAOx3  DP/PT pulses palpable 2/4, CRT < 3 sec to all digits.  Protective sensation intact. Motor function intact.  RIGHT foot: Incision is well coapted without any evidence of dehiscence sutures are intact.  There is decreased swelling to the area there is no erythema or increase in warmth.  There is no drainage or pus complaints there is no clinical signs of infection.  The toes are in rectus position.  K wire intact the second toe.  No drainage or signs of infection at the K wire site.  No other areas of tenderness to bilateral lower extremities.  No other open lesions or pre-ulcerative lesions.  No pain with calf compression, swelling, warmth, erythema.   Assessment and Plan:  Status post right foot surgery, doing well with no complications   -Treatment options discussed including all alternatives, risks, and complications -X-rays were obtained reviewed.  Hardware intact.  Osteotomy, arthrodesis sites appear to be healing.  No signs of acute fracture. -Overall she can start to transition to partial weightbearing in the cam boot.  I will use crutches to avoid but 100% weight on her foot but I want her to walk more heel-to-toe is putting weight just on her heel. -Sutures removed.  Antibiotic ointment was applied followed by a bandage.  Monitor for any signs or symptoms of infection.  Encouraged ice  and elevation of the swelling. -Monitor for any clinical signs or symptoms of infection and directed to call the office immediately should any occur or go to the ER.  Return in about 2 weeks (around 11/22/2018).-X-ray next appointment may be referred to physical therapy.  Also pin removal.   Trula Slade DPM

## 2018-11-22 ENCOUNTER — Ambulatory Visit (INDEPENDENT_AMBULATORY_CARE_PROVIDER_SITE_OTHER): Payer: Medicare HMO | Admitting: Podiatry

## 2018-11-22 ENCOUNTER — Ambulatory Visit (INDEPENDENT_AMBULATORY_CARE_PROVIDER_SITE_OTHER): Payer: Medicare HMO

## 2018-11-22 DIAGNOSIS — M21621 Bunionette of right foot: Secondary | ICD-10-CM

## 2018-11-22 DIAGNOSIS — M2011 Hallux valgus (acquired), right foot: Secondary | ICD-10-CM

## 2018-11-22 DIAGNOSIS — Z9889 Other specified postprocedural states: Secondary | ICD-10-CM

## 2018-11-23 NOTE — Progress Notes (Signed)
Subjective: Erin Vasquez is a 63 y.o. is seen today in office s/p right foot Lapidus, Akin bunionectomy, second metatarsal osteotomy, tailor's bunion correction preformed on 10/12/2018.  She presents here for possible pin removal.  She is overall she has been doing well.  She states she did drive today.  She has no new concerns.  She is not having any pain medication.  Denies any fevers, chills, nausea, vomiting.  No calf pain, chest pain, shortness of breath.  Objective: General: No acute distress, AAOx3  DP/PT pulses palpable 2/4, CRT < 3 sec to all digits.  Protective sensation intact. Motor function intact.  RIGHT foot: Incision is well coapted without any evidence of scar is well formed.  There is no significant discomfort to palpation of the surgical sites.  The toes and rectus position she states that looks much better than prior to surgery.  She is walking today in the cam boot.  K wire intact the second toe. No open lesions or gross lesions. No pain with calf compression, swelling, warmth, erythema.   Assessment and Plan:  Status post right foot surgery, doing well with no complications   -Treatment options discussed including all alternatives, risks, and complications -X-rays were obtained and reviewed.  Hardware intact.  There is mild hallux abductus present.  The lateral view there is still radiolucent line present along the second metatarsal. -Today the K wires removed in toto complications.  I did apply a splint to help hold the second toe in rectus position.  Also this will help hold the hallux in a rectus position. -Remain in cam boot.  Ice elevation. -Monitor for any clinical signs or symptoms of infection and directed to call the office immediately should any occur or go to the ER.  Trula Slade DPM

## 2018-12-04 ENCOUNTER — Other Ambulatory Visit: Payer: Self-pay | Admitting: Podiatry

## 2018-12-06 ENCOUNTER — Ambulatory Visit (INDEPENDENT_AMBULATORY_CARE_PROVIDER_SITE_OTHER): Payer: Medicare HMO | Admitting: Podiatry

## 2018-12-06 ENCOUNTER — Ambulatory Visit (INDEPENDENT_AMBULATORY_CARE_PROVIDER_SITE_OTHER): Payer: Medicare HMO

## 2018-12-06 ENCOUNTER — Other Ambulatory Visit: Payer: Self-pay

## 2018-12-06 ENCOUNTER — Encounter: Payer: Self-pay | Admitting: Podiatry

## 2018-12-06 ENCOUNTER — Ambulatory Visit: Payer: Medicare HMO

## 2018-12-06 DIAGNOSIS — M2041 Other hammer toe(s) (acquired), right foot: Secondary | ICD-10-CM

## 2018-12-06 DIAGNOSIS — M2011 Hallux valgus (acquired), right foot: Secondary | ICD-10-CM | POA: Diagnosis not present

## 2018-12-06 DIAGNOSIS — M21621 Bunionette of right foot: Secondary | ICD-10-CM

## 2018-12-12 NOTE — Progress Notes (Signed)
Subjective: Erin Vasquez is a 63 y.o. is seen today in office s/p right foot Lapidus, Akin bunionectomy, second metatarsal osteotomy, tailor's bunion correction preformed on 10/12/2018.  She did want her surgical shoe when she states that she has no pain.  She is not taking any pain medication.  She still has some swelling but overall she feels that she is improving. Denies any fevers, chills, nausea, vomiting.  No calf pain, chest pain, shortness of breath.  Objective: General: No acute distress, AAOx3  DP/PT pulses palpable 2/4, CRT < 3 sec to all digits.  Protective sensation intact. Motor function intact.  RIGHT foot: Incision is well coapted without any evidence of scar is well formed.  Feels swelling along the right foot.  There is no tenderness palpation the surgical sites there is no MPJ range of motion.  There is no erythema or warmth there is no clinical signs of infection.  Toes appear to be in rectus position. No open lesions or gross lesions. No pain with calf compression, swelling, warmth, erythema.   Assessment and Plan:  Status post right foot surgery, doing well with no complications   -Treatment options discussed including all alternatives, risks, and complications -X-rays were obtained and reviewed.  Hardware intact.  There is mild hallux abductus present.  The lateral view there is still radiolucent line present along the second metatarsal.  Overall the position appears to be unchanged. -She is having no pain however given her swelling as well as the second metatarsal I want her to remain immobilized in a surgical shoe with this.  Continue ice elevate.  Continue the splint to help all the toes and rectus position.  RTC 3-4 weeks   Trula Slade DPM

## 2018-12-13 DIAGNOSIS — M5412 Radiculopathy, cervical region: Secondary | ICD-10-CM | POA: Insufficient documentation

## 2018-12-27 ENCOUNTER — Other Ambulatory Visit: Payer: Self-pay

## 2018-12-27 ENCOUNTER — Ambulatory Visit (INDEPENDENT_AMBULATORY_CARE_PROVIDER_SITE_OTHER): Payer: Medicare HMO | Admitting: Podiatry

## 2018-12-27 ENCOUNTER — Ambulatory Visit (INDEPENDENT_AMBULATORY_CARE_PROVIDER_SITE_OTHER): Payer: Medicare HMO

## 2018-12-27 ENCOUNTER — Encounter: Payer: Self-pay | Admitting: Podiatry

## 2018-12-27 DIAGNOSIS — M2011 Hallux valgus (acquired), right foot: Secondary | ICD-10-CM

## 2018-12-27 DIAGNOSIS — M21621 Bunionette of right foot: Secondary | ICD-10-CM

## 2018-12-30 NOTE — Progress Notes (Addendum)
Subjective: Erin Vasquez is a 63 y.o. is seen today in office s/p right foot Lapidus, Akin bunionectomy, second metatarsal osteotomy, tailor's bunion correction preformed on 10/12/2018.  Overall she states that she is doing well.  She has been using a BioSkin brace that she thinks helps some.  She is still wearing a surgical shoe for the swelling although swelling has improved some.  She has been having no pain and she has been more active on her foot she relates.  She has no new concerns and she is very happy with her result so far. Denies any fevers, chills, nausea, vomiting.  No calf pain, chest pain, shortness of breath.  Objective: General: No acute distress, AAOx3  DP/PT pulses palpable 2/4, CRT < 3 sec to all digits.  Protective sensation intact. Motor function intact.  RIGHT foot: Incision is well coapted without any evidence of scar is well formed.  There is still swelling to the foot but overall is improved mildly.  There is no erythema or warmth associated with swelling there is no clinical signs of infection.  There is no pain with MPJ range of motion is adequate range of motion the first MPJ.  No pain the first metatarsocuneiform joint on the second metatarsal.  No other areas of tenderness are identified. No open lesions or gross lesions. No pain with calf compression, swelling, warmth, erythema.   Assessment and Plan:  Status post right foot surgery, doing well with no complications   -Treatment options discussed including all alternatives, risks, and complications -X-rays were reviewed.  There does appear to be some shifting of the first metatarsal and more hallux abductus present.  Hardware intact. Increased consolidation to the 2nd metatarsal osteotomy. There is still a radiolucent line in the arthrodesis site of the 1st metatarsal cuneiform joint.  -I did dispense a BioSkin brace to help hold the toe in a rectus position.  Things will do better than the BioSkin.  Continue with  surgical shoe for now.  Continue compression socks.  Also dispensed a new compression sock with him that she was wearing is now too loose.  -Ice/elvation -She has not been taking any pain medication and she has been feeling well.  -Monitor for any clinical signs or symptoms of infection and directed to call the office immediately should any occur or go to the ER.  *x-ray next appointment and consider bone stimulator.   Trula Slade DPM

## 2019-01-17 ENCOUNTER — Ambulatory Visit (INDEPENDENT_AMBULATORY_CARE_PROVIDER_SITE_OTHER): Payer: Medicare HMO | Admitting: Podiatry

## 2019-01-17 ENCOUNTER — Encounter: Payer: Self-pay | Admitting: Podiatry

## 2019-01-17 ENCOUNTER — Other Ambulatory Visit: Payer: Self-pay

## 2019-01-17 ENCOUNTER — Ambulatory Visit (INDEPENDENT_AMBULATORY_CARE_PROVIDER_SITE_OTHER): Payer: Medicare HMO

## 2019-01-17 VITALS — Temp 97.7°F

## 2019-01-17 DIAGNOSIS — M2011 Hallux valgus (acquired), right foot: Secondary | ICD-10-CM

## 2019-01-17 DIAGNOSIS — S92301K Fracture of unspecified metatarsal bone(s), right foot, subsequent encounter for fracture with nonunion: Secondary | ICD-10-CM

## 2019-01-25 NOTE — Progress Notes (Signed)
Subjective: Erin Vasquez is a 63 y.o. is seen today in office s/p right foot Lapidus, Akin bunionectomy, second metatarsal osteotomy, tailor's bunion correction preformed on 10/12/2018.  Overall she states that she is continued to do well she not taking any pain medicine she denies any pain.  Swelling has improved but she is unable to wear shoe because of swelling.  She states that she is happy with the surgery so far.  She has no other concerns.  Denies any fevers, chills, nausea, vomiting.  No calf pain, chest pain, shortness of breath.  Objective: General: No acute distress, AAOx3  DP/PT pulses palpable 2/4, CRT < 3 sec to all digits.  Protective sensation intact. Motor function intact.  RIGHT foot: Incision is well coapted without any evidence of scar is well formed.  There is still swelling to the surgical site but overall is improved.  There is no pain on the surgical sites there is no pain with MPJ range of motion.  There is no erythema associated with swelling.  Tenderness is elicited bilaterally today. No pain with calf compression, swelling, warmth, erythema.   Assessment and Plan:  Status post right foot surgery  -Treatment options discussed including all alternatives, risks, and complications -X-rays were reviewed.  There is still radiolucent line along the first metatarsocuneiform joint consistent with a nonunion at this point.  Approximately 1-31mm fracture gap is present. -Although she does not have any pain on surgical sites she still having swelling.  In the nonunion will order bone stimulator for her.  Continue in surgical shoe for now.  I want her to continue with the splint to help with having a rectus position.  Continue to the previous x-rays which has not changed any further position.  She states that she is so happy with the outcome of the surgery but did discuss further the change from the initial postoperative x-rays.  Trula Slade DPM

## 2019-01-26 ENCOUNTER — Telehealth: Payer: Self-pay | Admitting: Podiatry

## 2019-01-26 DIAGNOSIS — S92301K Fracture of unspecified metatarsal bone(s), right foot, subsequent encounter for fracture with nonunion: Secondary | ICD-10-CM

## 2019-01-26 NOTE — Telephone Encounter (Signed)
Faxed request information to [facility] - T. Nicole [facility].

## 2019-01-26 NOTE — Addendum Note (Signed)
Addended by: Harriett Sine D on: 01/26/2019 11:35 AM   Modules accepted: Orders

## 2019-01-26 NOTE — Telephone Encounter (Signed)
Pt calling to follow up on bone stimulator. Pt was told she would receive a call from [place] but it has been over a week and patient was told to call if she had not heard anything

## 2019-01-26 NOTE — Telephone Encounter (Addendum)
I informed Lytle Michaels - [employer] concerning pt's call. Lytle Michaels states Dr. Jacqualyn Posey spoke with him and he will need 1st clinical note, op note, 1st POV and latest clinical note, demographics and rx.

## 2019-01-27 ENCOUNTER — Telehealth: Payer: Self-pay | Admitting: *Deleted

## 2019-01-27 NOTE — Telephone Encounter (Signed)
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 Dr. Jacqualyn Posey signed and I faxed to [employer] without cover sheet as requested by T. Nicole Kindred.

## 2019-01-30 ENCOUNTER — Telehealth: Payer: Self-pay | Admitting: *Deleted

## 2019-01-30 NOTE — Telephone Encounter (Signed)
[employer] approved pt for Osteogenesis Ultrasound Stimulator. PA sent to be scanned to pt's chart in Epic.

## 2019-02-14 ENCOUNTER — Encounter: Payer: Self-pay | Admitting: Podiatry

## 2019-02-14 ENCOUNTER — Other Ambulatory Visit: Payer: Self-pay

## 2019-02-14 ENCOUNTER — Ambulatory Visit (INDEPENDENT_AMBULATORY_CARE_PROVIDER_SITE_OTHER): Payer: Medicare HMO | Admitting: Podiatry

## 2019-02-14 ENCOUNTER — Telehealth: Payer: Self-pay

## 2019-02-14 ENCOUNTER — Ambulatory Visit (INDEPENDENT_AMBULATORY_CARE_PROVIDER_SITE_OTHER): Payer: Medicare HMO

## 2019-02-14 VITALS — Temp 98.4°F

## 2019-02-14 DIAGNOSIS — M21621 Bunionette of right foot: Secondary | ICD-10-CM

## 2019-02-14 DIAGNOSIS — M2041 Other hammer toe(s) (acquired), right foot: Secondary | ICD-10-CM | POA: Diagnosis not present

## 2019-02-14 DIAGNOSIS — M2011 Hallux valgus (acquired), right foot: Secondary | ICD-10-CM

## 2019-02-14 DIAGNOSIS — Z9889 Other specified postprocedural states: Secondary | ICD-10-CM

## 2019-02-14 NOTE — Telephone Encounter (Signed)
Surgical clearance request from received from Doree Fudge and faxed back to [phone] on 02/14/2019.

## 2019-02-23 NOTE — Progress Notes (Signed)
Subjective: Erin Vasquez is a 63 y.o. is seen today in office s/p right foot Lapidus, Akin bunionectomy, second metatarsal osteotomy, tailor's bunion correction preformed on 10/12/2018.  She states that she is doing better.  She has been using a bone stimulator as directed.  She is back to wearing a sandal is having no pain.  Swelling is also improved.  She states that it is feeling "pretty good".  She is not taking any medication for the foot.  She is been doing daily activities without issues. Denies any fevers, chills, nausea, vomiting.  No calf pain, chest pain, shortness of breath.  Objective: General: No acute distress, AAOx3  DP/PT pulses palpable 2/4, CRT < 3 sec to all digits.  Protective sensation intact. Motor function intact.  RIGHT foot: Incision is well coapted without any evidence of scar is well formed. There is no significant discomfort palpation at surgical site.  Particularly on the Lapidus bunionectomy there is no pain.  There is decreased warmth of the foot. There is no erythema or warmth.  No clinical signs of infection noted today.  MPJ range of motion without pain.  No pain with calf compression, swelling, warmth, erythema.   Assessment and Plan:  Status post right foot surgery  -Treatment options discussed including all alternatives, risks, and complications -X-rays were reviewed.  1 of the staples does appear to be broken today.  Otherwise there is increased consolidation across the arthrodesis site. -Overall she is continued to improve and she is back to work and sandal.  Discussed with her continue with compression and return to a sneaker/closed in shoe as able.  Gradual increase to activity.  Continue to follow sooner.  Ice elevation.  Return in about 6 weeks (around 03/28/2019).  Trula Slade DPM

## 2019-02-27 DIAGNOSIS — R591 Generalized enlarged lymph nodes: Secondary | ICD-10-CM | POA: Insufficient documentation

## 2019-03-09 NOTE — H&P (Signed)
SHOULDER ARTHROPLASTY ADMISSION H&P  Patient ID: CELES DEDIC MRN: [id number] DOB/AGE: 01/30/1956 63 y.o.  Chief Complaint: left shoulder pain.  Planned Procedure Date: 04/04/19 Medicaland CardiacClearanceby Dr. Nathaniel Man Neuro:Dr. Jaynee Eagles. Vascular: Dr. Kellie Simmering. Heme/Onc: Dr. Alen Blew.  PM&R: Dr. Lyla Son  HPI: Erin Vasquez is a 63 y.o. female with a history of gastric bypass, DVT / PE,GERD,Hypothyroidism,and Left shoulder irreparable RTC s/p Biceps tenotomy, SAD, DCR, left shoulder in 2015.  She presents for evaluation of RTC deficient OA LEFT SHOULDER. The patient has a history of pain and functional disability in the left shoulder due to arthritis and has failed non-surgical conservative treatments for greater than 12 weeks to include NSAID's and/or analgesics, corticosteriod injections and activity modification.  Onset of symptoms was gradual, starting 5 +years ago with gradually worsening course since that time.  Patient currently rates pain at 8 out of 10 with activity. Patient has night pain, worsening of pain with activity and weight bearing and pain that interferes with activities of daily living.  Patient has evidence of RTC deficient osteoarthritis by imaging studies.  There is no active infection.  Past Medical History:  Diagnosis Date  . Anemia    during pregnancy  . Anxiety   . Arthritis    "all over" (07/07/2017)  . Bilateral pulmonary embolism ([facility]) 10/29/2015   "from my left ankle"  . Childhood asthma    as child, nothing as adult  . Chronic back pain    "all over" (07/07/2017)  . DVT (deep venous thrombosis) ([place]) 10/29/2015   "from my left ankle"  . GERD (gastroesophageal reflux disease)    tx. Tums  . H/O hiatal hernia    "repaired w/gastric bypass OR" (07/07/2017)  . History of esophageal dilatation   . Hypertension   . Hypothyroidism   . Lupus anticoagulant disorder (HCC)    Dr. Alen Blew   . Neuromuscular disorder ([place])    L thigh - Numbness,  nerve damage    . Sacroiliac pain    "left thigh has been numb since 01/2016 after appendectomy" (07/07/2017)  . Sleep apnea    07/07/2017 "bariatric dr says I don't need CPAP anymore" (07/07/2017)  . Thyroid disease   . Uterine cancer ([place]) 01/2014  . Varicose veins   . Ventral hernia   . Vitamin D deficiency    Past Surgical History:  Procedure Laterality Date  . ABDOMINAL HYSTERECTOMY  01/2014   "robotic"  . APPENDECTOMY     ruptured 01/2016  . BALLOON DILATION N/A 08/02/2013   Procedure: BALLOON DILATION;  Surgeon: Arta Silence, MD;  Location: [facility];  Service: Endoscopy;  Laterality: N/A;  . BUNIONECTOMY WITH HAMMERTOE RECONSTRUCTION Left ~ 2005  . COLONOSCOPY WITH PROPOFOL N/A 08/02/2013   Procedure: COLONOSCOPY WITH PROPOFOL;  Surgeon: Arta Silence, MD;  Location: [facility];  Service: Endoscopy;  Laterality: N/A;  . DILATION AND CURETTAGE OF UTERUS  x2   S/P miscarriage  . ESOPHAGOGASTRODUODENOSCOPY (EGD) WITH ESOPHAGEAL DILATION  ~ 2003  . ESOPHAGOGASTRODUODENOSCOPY (EGD) WITH PROPOFOL N/A 08/02/2013   Procedure: ESOPHAGOGASTRODUODENOSCOPY (EGD) WITH PROPOFOL;  Surgeon: Arta Silence, MD;  Location: [facility];  Service: Endoscopy;  Laterality: N/A;  . HERNIA REPAIR  04/2017   VHR  . INSERTION OF MESH N/A 04/28/2017   Procedure: POSSIBLE INSERTION OF MESH;  Surgeon: Coralie Keens, MD;  Location: [facility];  Service: General;  Laterality: N/A;  . JOINT REPLACEMENT    . LAPAROSCOPIC APPENDECTOMY N/A 02/07/2016   Procedure: LAPAROSCOPIC APPENDECTOMY;  Surgeon: Nathaneil Canary  Ninfa Linden, MD;  Location: [facility];  Service: General;  Laterality: N/A;  . ROBOTIC ASSISTED TOTAL HYSTERECTOMY WITH BILATERAL SALPINGO OOPHERECTOMY  01/23/2014   uterine cancer  . ROUX-EN-Y GASTRIC BYPASS  11/2016   Cherrie Gauze  . SHOULDER ARTHROSCOPY WITH DEBRIDEMENT AND BICEP TENDON REPAIR Left 09/11/2014   Procedure: SHOULDER ARTHROSCOPY WITH DEBRIDEMENT AND BICEP TENDON REPAIR;  Surgeon:  Renette Butters, MD;  Location: [facility];  Service: Orthopedics;  Laterality: Left;  . SHOULDER ARTHROSCOPY WITH DISTAL CLAVICLE RESECTION Left 09/11/2014   Procedure: SHOULDER ARTHROSCOPY WITH DISTAL CLAVICLE RESECTION;  Surgeon: Renette Butters, MD;  Location: [facility];  Service: Orthopedics;  Laterality: Left;  . SHOULDER ARTHROSCOPY WITH SUBACROMIAL DECOMPRESSION Left 09/11/2014   Procedure: SHOULDER ARTHROSCOPY WITH SUBACROMIAL DECOMPRESSION;  Surgeon: Renette Butters, MD;  Location: [facility];  Service: Orthopedics;  Laterality: Left;  . TONSILLECTOMY    . TOTAL KNEE ARTHROPLASTY Right 07/06/2017  . TOTAL KNEE ARTHROPLASTY Right 07/06/2017   Procedure: RIGHT TOTAL KNEE ARTHROPLASTY;  Surgeon: Renette Butters, MD;  Location: [place] [facility];  Service: Orthopedics;  Laterality: Right;  . TOTAL KNEE ARTHROPLASTY Left 04/19/2018   Procedure: LEFT TOTAL KNEE ARTHROPLASTY;  Surgeon: Renette Butters, MD;  Location: [facility];  Service: Orthopedics;  Laterality: Left;  Marland Kitchen VENTRAL HERNIA REPAIR N/A 04/28/2017   Procedure: HERNIA REPAIR VENTRAL ADULT;  Surgeon: Coralie Keens, MD;  Location: [facility];  Service: General;  Laterality: N/A;   Allergies  Allergen Reactions  . Bee Venom Swelling, Rash and Other (See Comments)    Patient is allergic to bee venom, mosquitos, wasps, yellow jackets and bees. Localized swelling Fever    . Hymenoptera Venom Preparations Rash and Other (See Comments)    Patient is allergic to bee venom, mosquitos, wasps, yellow jackets and bees. Localized swelling Fever  . Other Anaphylaxis, Shortness Of Breath, Itching and Rash    HICKORY SMOKE   . Penicillins Anaphylaxis and Other (See Comments)    PATIENT HAS HAD A PCN REACTION WITH IMMEDIATE RASH, FACIAL/TONGUE/THROAT SWELLING, SOB, OR LIGHTHEADEDNESS WITH HYPOTENSION:  #  #  #  YES  #  #  #   Has patient had a PCN reaction causing severe rash involving mucus membranes or skin necrosis: NO Has patient had a PCN reaction that required  hospitalization NO Has patient had a PCN reaction occurring within the last 10 years: NO  . Food Other (See Comments)    TANGERINES UNSPECIFIED CHILDHOOD REACTION   . Latex Rash    Latex rash due to elastic in zip-up TED hose at home (local rash), pt. Has had flu vaccine before without problems.  . Morphine And Related Itching  . Plastibase Rash  . Tangerine Flavor Rash   Prior to Admission medications   Medication Sig Start Date End Date Taking? Authorizing Provider  amLODipine (NORVASC) 2.5 MG tablet Take 2.5 mg by mouth daily. 12/02/18   [provider]  Calcium Carbonate-Vitamin D (CALTRATE 600+D PO) Take 1 tablet by mouth daily.    [provider]  Cholecalciferol (VITAMIN D3) 5000 units TABS Take 5,000 Units by mouth daily.  12/03/15   [provider]  cyclobenzaprine (FLEXERIL) 10 MG tablet TAKE 1 2 (ONE HALF) TABLET BY MOUTH THREE TIMES DAILY AS NEEDED FOR MUSCLE SPASM. (START AFTER YOU FINISH OXYCODONE) FOR UP TO 10 DAYS. 10/14/18   [provider]  diclofenac sodium (VOLTAREN) 1 % GEL  07/13/18   [provider]  enoxaparin (LOVENOX) 40 MG/0.4ML injection  Inject 0.4 mLs (40 mg total) into the skin daily. 10/27/18   Trula Slade, DPM  famotidine (PEPCID) 20 MG tablet Take by mouth. 09/01/18   [provider]  gabapentin (NEURONTIN) 300 MG capsule Take 300 mg by mouth 3 (three) times daily. 02/05/19   [provider]  levothyroxine (SYNTHROID, LEVOTHROID) 50 MCG tablet Take 50 mcg by mouth daily before breakfast.     [provider]  methocarbamol (ROBAXIN) 500 MG tablet Take by mouth. 07/15/18   [provider]  Multiple Vitamin (MULTIVITAMIN WITH MINERALS) TABS tablet Take 1 tablet by mouth 2 (two) times daily.    [provider]  omeprazole (PRILOSEC) 40 MG capsule Take 40 mg by mouth at bedtime.     [provider]  ranitidine (ZANTAC) 150 MG tablet Take 150 mg by mouth every  morning.     [provider]   Social History: Divorced non-smoker.  No alcohol use.  Family History  Problem Relation Age of Onset  . Transient ischemic attack Mother   . Heart attack Father   . Diabetes type II Other   . Lung cancer Maternal Grandmother     ROS: Currently denies lightheadedness, dizziness, Fever, chills, CP, SOB.   No personal history of MI, or CVA. No loose teeth or dentures All other systems have been reviewed and were otherwise currently negative with the exception of those mentioned in the HPI and as above.  Objective: Vitals: Ht: 5 feet 4 inches wt: 155 temp: 97.8  BP: 119/71 pulse: 63 O2 94 % on room air.   Physical Exam: General: Alert, NAD.   HEENT: EOMI, Good Neck Extension  Pulm: No increased work of breathing.  Clear B/L A/P w/o crackle or wheeze.  CV: RRR, No m/g/r appreciated  GI: soft, NT, ND Neuro: Neuro without gross focal deficit.  Sensation intact distally Skin: No lesions in the area of chief complaint MSK/Surgical Site: left shoulder pain with range of motion.  Forward flexion/abduction approximately 60 degrees.  Internal rotation to low lumbar.  External rotation to 10 degrees.  No AC pain.  Mild biceps pain.  Lacks Rotator cuff strength.  NVI distally.  Imaging Review Plain radiographs demonstrate rotator cuff deficient osteoarthritis left shoulder.    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 The treatment options including medical management, injection therapy, and arthroplasty were discussed at length. The risks and benefits of Procedure(s): TOTAL SHOULDER ARTHROPLASTY were presented and reviewed.  The risks of nonoperative treatment, versus surgical intervention including but not limited to continued pain, aseptic loosening, stiffness, dislocation/subluxation, infection, bleeding, nerve injury, blood clots, cardiopulmonary complications, morbidity, mortality, among others were discussed. The patient verbalizes understanding and wishes to proceed with the plan.  Patient is being admitted for surgery, OT, pain control, prophylactic antibiotics, VTE prophylaxis, progressive ambulation, ADL's and discharge planning.   Dental prophylaxis discussed and recommended for 2 years postoperatively.   The patient does not meet the criteria for  TXAdt DVT/PE history.   Lovenox will be used postoperatively while inpatient for DVT prophylaxis in addition to SCDs, and early ambulation.  PCN allergy as a child reported as life-treating / affected breathing  Plan for Tylenol, Gabapentin, oxycodone for pain.  Baclofen for spasm.   The patient is planning to be discharged home.   Anticipated LOS less than 2 midnights.  Insurance approval for inpatient due to -with one or more of the following:  - Obesity  - Expected need for hospital services (PT, OT, Nursing) required for safe  discharge  - Anticipated need for postoperative skilled nursing care or inpatient rehab  - Active co-morbidities: Chronic pain requiring opiods and h/o DVT/VTE   Prudencio Burly III, PA-C 03/13/2019 3:32 PM

## 2019-03-13 ENCOUNTER — Other Ambulatory Visit: Payer: Self-pay | Admitting: Orthopedic Surgery

## 2019-03-13 DIAGNOSIS — M19212 Secondary osteoarthritis, left shoulder: Secondary | ICD-10-CM | POA: Diagnosis present

## 2019-03-13 DIAGNOSIS — M25512 Pain in left shoulder: Secondary | ICD-10-CM

## 2019-03-23 ENCOUNTER — Other Ambulatory Visit (HOSPITAL_COMMUNITY): Payer: Self-pay | Admitting: *Deleted

## 2019-03-23 NOTE — Patient Instructions (Addendum)
YOU NEED TO HAVE A COVID 19 TEST ON 03-31-19  @ 11:30 AM, THIS TEST MUST BE DONE BEFORE SURGERY, COME TO [place] ENTRANCE. ONCE YOUR COVID TEST IS COMPLETED, PLEASE BEGIN THE QUARANTINE INSTRUCTIONS AS OUTLINED IN YOUR HANDOUT.                Mignon [place]    Your procedure is scheduled on: 04-04-2019   Report to [facility] Main  Entrance    Report to [place] at 530 AM    Call this number if you have problems the morning of surgery [phone]    Remember:  NO SOLID FOOD AFTER MIDNIGHT THE NIGHT PRIOR TO SURGERY. NOTHING BY MOUTH EXCEPT CLEAR LIQUIDS UNTIL 430 AM. PLEASE FINISH ENSURE DRINK PER SURGEON ORDER 3 HOURS PRIOR TO SCHEDULED SURGERY TIME WHICH NEEDS TO BE COMPLETED AT 430 AM.   CLEAR LIQUID DIET   Foods Allowed                                                                     Foods Excluded  Coffee and tea, regular and decaf                             liquids that you cannot  Plain Jell-O in any flavor                                             see through such as: Fruit ices (not with fruit pulp)                                     milk, soups, orange juice  Iced Popsicles                                    All solid food Carbonated beverages, regular and diet                                    Cranberry, grape and apple juices Sports drinks like Gatorade Lightly seasoned clear broth or consume(fat free) Sugar, honey syrup  Sample Menu Breakfast                                Lunch                                     Supper Cranberry juice                    Beef broth                            Chicken broth Jell-O  Grape juice                           Apple juice Coffee or tea                        Jell-O                                      Popsicle                                                Coffee or tea                        Coffee or  tea  _____________________________________________________________________     Take these medicines the morning of surgery with A SIP OF WATER:  Famotidine (Pepcid), and Levothyroxine (Synthroid)  BRUSH YOUR TEETH MORNING OF SURGERY AND RINSE YOUR MOUTH OUT, NO CHEWING GUM CANDY OR MINTS.                                 You may not have any metal on your body including hair pins and              piercings     Do not wear jewelry, make-up, lotions, powders or perfumes, deodorant             Do not wear nail polish.  Do not shave  48 hours prior to surgery.               Do not bring valuables to the hospital. [place].  Contacts, dentures or bridgework may not be worn into surgery.      : _____________________________________________________________________             [facility] - Preparing for Surgery Before surgery, you can play an important role.  Because skin is not sterile, your skin needs to be as free of germs as possible.  You can reduce the number of germs on your skin by washing with CHG (chlorahexidine gluconate) soap before surgery.  CHG is an antiseptic cleaner which kills germs and bonds with the skin to continue killing germs even after washing. Please DO NOT use if you have an allergy to CHG or antibacterial soaps.  If your skin becomes reddened/irritated stop using the CHG and inform your nurse when you arrive at Short Stay. Do not shave (including legs and underarms) for at least 48 hours prior to the first CHG shower.  You may shave your face/neck. Please follow these instructions carefully:  1.  Shower with CHG Soap the night before surgery and the  morning of Surgery.  2.  If you choose to wash your hair, wash your hair first as usual with your  normal  shampoo.  3.  After you shampoo, rinse your hair and body thoroughly to remove the  shampoo.  4.  Use CHG as you would any other  liquid soap.  You can apply chg directly  to the skin and wash                       Gently with a scrungie or clean washcloth.  5.  Apply the CHG Soap to your body ONLY FROM THE NECK DOWN.   Do not use on face/ open                           Wound or open sores. Avoid contact with eyes, ears mouth and genitals (private parts).                       Wash face,  Genitals (private parts) with your normal soap.             6.  Wash thoroughly, paying special attention to the area where your surgery  will be performed.  7.  Thoroughly rinse your body with warm water from the neck down.  8.  DO NOT shower/wash with your normal soap after using and rinsing off  the CHG Soap.                9.  Pat yourself dry with a clean towel.            10.  Wear clean pajamas.            11.  Place clean sheets on your bed the night of your first shower and do not  sleep with pets. Day of Surgery : Do not apply any lotions/deodorants the morning of surgery.  Please wear clean clothes to the hospital/surgery center.  FAILURE TO FOLLOW THESE INSTRUCTIONS MAY RESULT IN THE CANCELLATION OF YOUR SURGERY PATIENT SIGNATURE_________________________________  NURSE SIGNATURE__________________________________  ________________________________________________________________________   Adam Phenix  An incentive spirometer is a tool that can help keep your lungs clear and active. This tool measures how well you are filling your lungs with each breath. Taking long deep breaths may help reverse or decrease the chance of developing breathing (pulmonary) problems (especially infection) following:  A long period of time when you are unable to move or be active. BEFORE THE PROCEDURE   If the spirometer includes an indicator to show your best effort, your nurse or respiratory therapist will set it to a desired goal.  If possible, sit up straight or lean slightly forward. Try not to slouch.  Hold the incentive  spirometer in an upright position. INSTRUCTIONS FOR USE  1. Sit on the edge of your bed if possible, or sit up as far as you can in bed or on a chair. 2. Hold the incentive spirometer in an upright position. 3. Breathe out normally. 4. Place the mouthpiece in your mouth and seal your lips tightly around it. 5. Breathe in slowly and as deeply as possible, raising the piston or the ball toward the top of the column. 6. Hold your breath for 3-5 seconds or for as long as possible. Allow the piston or ball to fall to the bottom of the column. 7. Remove the mouthpiece from your mouth and breathe out normally. 8. Rest for a few seconds and repeat Steps 1 through 7 at least 10 times every 1-2 hours when you are awake. Take your time and take a few normal breaths between deep breaths. 9. The spirometer may include an indicator to  show your best effort. Use the indicator as a goal to work toward during each repetition. 10. After each set of 10 deep breaths, practice coughing to be sure your lungs are clear. If you have an incision (the cut made at the time of surgery), support your incision when coughing by placing a pillow or rolled up towels firmly against it. Once you are able to get out of bed, walk around indoors and cough well. You may stop using the incentive spirometer when instructed by your caregiver.  RISKS AND COMPLICATIONS  Take your time so you do not get dizzy or light-headed.  If you are in pain, you may need to take or ask for pain medication before doing incentive spirometry. It is harder to take a deep breath if you are having pain. AFTER USE  Rest and breathe slowly and easily.  It can be helpful to keep track of a log of your progress. Your caregiver can provide you with a simple table to help with this. If you are using the spirometer at home, follow these instructions: [place] IF:   You are having difficultly using the spirometer.  You have trouble using the  spirometer as often as instructed.  Your pain medication is not giving enough relief while using the spirometer.  You develop fever of 100.5 F (38.1 C) or higher. SEEK IMMEDIATE MEDICAL CARE IF:   You cough up bloody sputum that had not been present before.  You develop fever of 102 F (38.9 C) or greater.  You develop worsening pain at or near the incision site. MAKE SURE YOU:   Understand these instructions.  Will watch your condition.  Will get help right away if you are not doing well or get worse. Document Released: 01/11/2007 Document Revised: 11/23/2011 Document Reviewed: 03/14/2007 [facility] Patient Information 2014 [place], [place].   ________________________________________________________________________

## 2019-03-23 NOTE — Progress Notes (Signed)
MEDICAL CLEARANCE DR Doreene Nest 02-27-2019 ON CHART NEUROLOGY CLEARANCE DR WILLIS 02-14-19 ON CHART HEMATOLOGY CLEARANCE DR Alen Blew 02-13-19 ON CHART

## 2019-03-24 ENCOUNTER — Other Ambulatory Visit: Payer: Self-pay

## 2019-03-24 ENCOUNTER — Ambulatory Visit
Admission: RE | Admit: 2019-03-24 | Discharge: 2019-03-24 | Disposition: A | Discharge status: Home / Self Care | Payer: Medicare HMO | Source: Ambulatory Visit | Attending: Orthopedic Surgery | Admitting: Orthopedic Surgery

## 2019-03-24 DIAGNOSIS — M25512 Pain in left shoulder: Secondary | ICD-10-CM

## 2019-03-27 ENCOUNTER — Encounter (HOSPITAL_COMMUNITY): Payer: Self-pay

## 2019-03-27 ENCOUNTER — Other Ambulatory Visit: Payer: Self-pay

## 2019-03-27 ENCOUNTER — Encounter (HOSPITAL_COMMUNITY)
Admission: RE | Admit: 2019-03-27 | Discharge: 2019-03-27 | Disposition: A | Discharge status: Home / Self Care | Payer: Medicare HMO | Source: Ambulatory Visit | Attending: Orthopedic Surgery | Admitting: Orthopedic Surgery

## 2019-03-27 DIAGNOSIS — I10 Essential (primary) hypertension: Secondary | ICD-10-CM | POA: Diagnosis not present

## 2019-03-27 DIAGNOSIS — Z01818 Encounter for other preprocedural examination: Secondary | ICD-10-CM | POA: Diagnosis not present

## 2019-03-27 DIAGNOSIS — M19012 Primary osteoarthritis, left shoulder: Secondary | ICD-10-CM | POA: Insufficient documentation

## 2019-03-27 LAB — URINALYSIS, ROUTINE W REFLEX MICROSCOPIC
Bilirubin Urine: NEGATIVE
Glucose, UA: NEGATIVE mg/dL
Hgb urine dipstick: NEGATIVE
Ketones, ur: NEGATIVE mg/dL
Nitrite: NEGATIVE
Protein, ur: NEGATIVE mg/dL
Specific Gravity, Urine: 1.024 (ref 1.005–1.030)
pH: 5 (ref 5.0–8.0)

## 2019-03-27 LAB — CBC
HCT: 41.6 % (ref 36.0–46.0)
Hemoglobin: 13 g/dL (ref 12.0–15.0)
MCH: 30.4 pg (ref 26.0–34.0)
MCHC: 31.3 g/dL (ref 30.0–36.0)
MCV: 97.4 fL (ref 80.0–100.0)
Platelets: 257 10*3/uL (ref 150–400)
RBC: 4.27 MIL/uL (ref 3.87–5.11)
RDW: 13.1 % (ref 11.5–15.5)
WBC: 4.8 10*3/uL (ref 4.0–10.5)
nRBC: 0 % (ref 0.0–0.2)

## 2019-03-27 LAB — BASIC METABOLIC PANEL
Anion gap: 10 (ref 5–15)
BUN: 16 mg/dL (ref 8–23)
CO2: 25 mmol/L (ref 22–32)
Calcium: 8.9 mg/dL (ref 8.9–10.3)
Chloride: 107 mmol/L (ref 98–111)
Creatinine, Ser: 0.65 mg/dL (ref 0.44–1.00)
GFR calc Af Amer: >60 mL/min (ref >60)
GFR calc non Af Amer: >60 mL/min (ref >60)
Glucose, Bld: 83 mg/dL (ref 70–99)
Potassium: 4.1 mmol/L (ref 3.5–5.1)
Sodium: 142 mmol/L (ref 135–145)

## 2019-03-27 LAB — SURGICAL PCR SCREEN
MRSA, PCR: NEGATIVE
Staphylococcus aureus: NEGATIVE

## 2019-03-27 NOTE — Progress Notes (Signed)
Dr. Rayetta Pigg clearance on 6-15 recommends that pt be placed on an anticoagulate perioperative. Pt states that Claiborne Billings in Dr. [name] office indicated on 02-13-19 that she did not need anticoagulate. Advised patient to contact Dr. Debroah Loop office, as Dr. Gillis Ends was seen on 02-27-19, which was after she spoke to [place] on 02-13-19. Pt verbalized understanding, and indicated that she will contact Dr. Debroah Loop office.

## 2019-03-28 ENCOUNTER — Ambulatory Visit (INDEPENDENT_AMBULATORY_CARE_PROVIDER_SITE_OTHER): Payer: Self-pay | Admitting: Podiatry

## 2019-03-28 ENCOUNTER — Ambulatory Visit (INDEPENDENT_AMBULATORY_CARE_PROVIDER_SITE_OTHER): Payer: Medicare HMO

## 2019-03-28 VITALS — Temp 97.3°F

## 2019-03-28 DIAGNOSIS — M2011 Hallux valgus (acquired), right foot: Secondary | ICD-10-CM

## 2019-03-31 ENCOUNTER — Other Ambulatory Visit (HOSPITAL_COMMUNITY)
Admission: RE | Admit: 2019-03-31 | Discharge: 2019-03-31 | Disposition: A | Discharge status: Home / Self Care | Payer: Medicare HMO | Source: Ambulatory Visit | Attending: Orthopedic Surgery | Admitting: Orthopedic Surgery

## 2019-03-31 DIAGNOSIS — Z1159 Encounter for screening for other viral diseases: Secondary | ICD-10-CM | POA: Diagnosis present

## 2019-03-31 LAB — SARS CORONAVIRUS 2 (TAT 6-24 HRS): SARS Coronavirus 2: NEGATIVE

## 2019-04-03 ENCOUNTER — Encounter (HOSPITAL_COMMUNITY): Payer: Self-pay | Admitting: Anesthesiology

## 2019-04-03 NOTE — Anesthesia Preprocedure Evaluation (Addendum)
Anesthesia Evaluation  Patient identified by MRN, date of birth, ID band Patient awake    Reviewed: Allergy & Precautions, NPO status , Patient's Chart, lab work & pertinent test results  Airway Mallampati: II  TM Distance: >3 FB Neck ROM: Full    Dental  (+) Teeth Intact, Chipped,    Pulmonary asthma , sleep apnea ,  Hx/o childhood asthma Hx/o OSA- has not required CPAP since weight loss   Pulmonary exam normal breath sounds clear to auscultation       Cardiovascular hypertension, Pt. 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  Neuromuscular disease    GI/Hepatic Neg liver ROS, hiatal hernia, GERD  Medicated and Controlled,S/P gastric bypass   Endo/Other  Hypothyroidism   Renal/GU Renal diseaseHx/o AKI with perforated appendix  negative genitourinary   Musculoskeletal  (+) Arthritis , Osteoarthritis,  OA left shoulder OA bilateral hips   Abdominal   Peds  Hematology  (+) Blood dyscrasia, anemia , Hx/o lupus anticoagulant Lovenox therapy for DVT/PTE- last dose   Anesthesia Other Findings   Reproductive/Obstetrics Hx/o endometrial Ca                           Anesthesia Physical Anesthesia Plan  ASA: III  Anesthesia Plan: General   Post-op Pain Management:  Regional for Post-op pain   Induction: Intravenous  PONV Risk Score and Plan: 4 or greater and Midazolam, Ondansetron, Treatment may vary due to age or medical condition, Dexamethasone and Scopolamine patch - Pre-op  Airway Management Planned: Oral ETT  Additional Equipment:   Intra-op Plan:   Post-operative Plan: Extubation in OR  Informed Consent: I have reviewed the patients History and Physical, chart, labs and discussed the  procedure including the risks, benefits and alternatives for the proposed anesthesia with the patient or authorized representative who has indicated his/her understanding and acceptance.     Dental advisory given  Plan Discussed with: CRNA and Surgeon  Anesthesia Plan Comments:        Anesthesia Quick Evaluation

## 2019-04-04 ENCOUNTER — Inpatient Hospital Stay (HOSPITAL_COMMUNITY): Payer: Medicare HMO | Admitting: Anesthesiology

## 2019-04-04 ENCOUNTER — Inpatient Hospital Stay (HOSPITAL_COMMUNITY)
Admission: RE | Admit: 2019-04-04 | Discharge: 2019-04-05 | DRG: 483 | Disposition: A | Discharge status: Home / Self Care | Payer: Medicare HMO | Attending: Orthopedic Surgery | Admitting: Orthopedic Surgery

## 2019-04-04 ENCOUNTER — Inpatient Hospital Stay (HOSPITAL_COMMUNITY): Payer: Medicare HMO | Admitting: Emergency Medicine

## 2019-04-04 ENCOUNTER — Other Ambulatory Visit: Payer: Self-pay

## 2019-04-04 ENCOUNTER — Inpatient Hospital Stay (HOSPITAL_COMMUNITY): Payer: Medicare HMO

## 2019-04-04 ENCOUNTER — Encounter (HOSPITAL_COMMUNITY): Payer: Self-pay | Admitting: Anesthesiology

## 2019-04-04 ENCOUNTER — Encounter (HOSPITAL_COMMUNITY)
Admission: RE | Disposition: A | Discharge status: Home / Self Care | Payer: Self-pay | Source: Home / Self Care | Attending: Orthopedic Surgery

## 2019-04-04 DIAGNOSIS — Z91030 Bee allergy status: Secondary | ICD-10-CM

## 2019-04-04 DIAGNOSIS — E079 Disorder of thyroid, unspecified: Secondary | ICD-10-CM | POA: Diagnosis present

## 2019-04-04 DIAGNOSIS — I10 Essential (primary) hypertension: Secondary | ICD-10-CM | POA: Diagnosis present

## 2019-04-04 DIAGNOSIS — G4730 Sleep apnea, unspecified: Secondary | ICD-10-CM | POA: Diagnosis present

## 2019-04-04 DIAGNOSIS — Z888 Allergy status to other drugs, medicaments and biological substances status: Secondary | ICD-10-CM | POA: Diagnosis not present

## 2019-04-04 DIAGNOSIS — Z86711 Personal history of pulmonary embolism: Secondary | ICD-10-CM

## 2019-04-04 DIAGNOSIS — K219 Gastro-esophageal reflux disease without esophagitis: Secondary | ICD-10-CM

## 2019-04-04 DIAGNOSIS — Z79890 Hormone replacement therapy (postmenopausal): Secondary | ICD-10-CM | POA: Diagnosis not present

## 2019-04-04 DIAGNOSIS — Z79899 Other long term (current) drug therapy: Secondary | ICD-10-CM

## 2019-04-04 DIAGNOSIS — E559 Vitamin D deficiency, unspecified: Secondary | ICD-10-CM | POA: Diagnosis present

## 2019-04-04 DIAGNOSIS — M25512 Pain in left shoulder: Secondary | ICD-10-CM | POA: Diagnosis present

## 2019-04-04 DIAGNOSIS — E039 Hypothyroidism, unspecified: Secondary | ICD-10-CM | POA: Diagnosis present

## 2019-04-04 DIAGNOSIS — Z96653 Presence of artificial knee joint, bilateral: Secondary | ICD-10-CM | POA: Diagnosis present

## 2019-04-04 DIAGNOSIS — Z880 Allergy status to penicillin: Secondary | ICD-10-CM

## 2019-04-04 DIAGNOSIS — Z86718 Personal history of other venous thrombosis and embolism: Secondary | ICD-10-CM

## 2019-04-04 DIAGNOSIS — Z91018 Allergy to other foods: Secondary | ICD-10-CM | POA: Diagnosis not present

## 2019-04-04 DIAGNOSIS — Z9884 Bariatric surgery status: Secondary | ICD-10-CM | POA: Diagnosis not present

## 2019-04-04 DIAGNOSIS — Z96612 Presence of left artificial shoulder joint: Secondary | ICD-10-CM

## 2019-04-04 DIAGNOSIS — Z91040 Latex allergy status: Secondary | ICD-10-CM

## 2019-04-04 DIAGNOSIS — M19212 Secondary osteoarthritis, left shoulder: Secondary | ICD-10-CM | POA: Diagnosis present

## 2019-04-04 DIAGNOSIS — F419 Anxiety disorder, unspecified: Secondary | ICD-10-CM | POA: Diagnosis present

## 2019-04-04 DIAGNOSIS — Z8542 Personal history of malignant neoplasm of other parts of uterus: Secondary | ICD-10-CM

## 2019-04-04 DIAGNOSIS — M549 Dorsalgia, unspecified: Secondary | ICD-10-CM | POA: Diagnosis present

## 2019-04-04 DIAGNOSIS — G8929 Other chronic pain: Secondary | ICD-10-CM | POA: Diagnosis present

## 2019-04-04 DIAGNOSIS — D6862 Lupus anticoagulant syndrome: Secondary | ICD-10-CM | POA: Diagnosis present

## 2019-04-04 DIAGNOSIS — Z8719 Personal history of other diseases of the digestive system: Secondary | ICD-10-CM

## 2019-04-04 HISTORY — PX: TOTAL SHOULDER ARTHROPLASTY: SHX126

## 2019-04-04 LAB — CBC
HCT: 38.7 % (ref 36.0–46.0)
Hemoglobin: 11.9 g/dL — ABNORMAL LOW (ref 12.0–15.0)
MCH: 30.4 pg (ref 26.0–34.0)
MCHC: 30.7 g/dL (ref 30.0–36.0)
MCV: 98.7 fL (ref 80.0–100.0)
Platelets: 238 10*3/uL (ref 150–400)
RBC: 3.92 MIL/uL (ref 3.87–5.11)
RDW: 13.2 % (ref 11.5–15.5)
WBC: 11.2 10*3/uL — ABNORMAL HIGH (ref 4.0–10.5)
nRBC: 0 % (ref 0.0–0.2)

## 2019-04-04 LAB — CREATININE, SERUM
Creatinine, Ser: 0.68 mg/dL (ref 0.44–1.00)
GFR calc Af Amer: >60 mL/min (ref >60)
GFR calc non Af Amer: >60 mL/min (ref >60)

## 2019-04-04 SURGERY — ARTHROPLASTY, SHOULDER, TOTAL
Anesthesia: General | Site: Shoulder | Laterality: Left

## 2019-04-04 MED ORDER — LACTATED RINGERS IV SOLN
INTRAVENOUS | Status: DC
  Administered 2019-04-04: 07:00:00 via INTRAVENOUS

## 2019-04-04 MED ORDER — METOCLOPRAMIDE HCL 5 MG/ML IJ SOLN: 5.0000 mg | Freq: Three times a day (TID) | INTRAMUSCULAR | Status: DC | PRN

## 2019-04-04 MED ORDER — CHLORHEXIDINE GLUCONATE 4 % EX LIQD: 60.0000 mL | Freq: Once | CUTANEOUS | Status: DC

## 2019-04-04 MED ORDER — FENTANYL CITRATE (PF) 100 MCG/2ML IJ SOLN: 25.0000 ug | INTRAMUSCULAR | Status: DC | PRN

## 2019-04-04 MED ORDER — BUPIVACAINE LIPOSOME 1.3 % IJ SUSP
INTRAMUSCULAR | Status: DC | PRN
  Administered 2019-04-04: 10 mL via PERINEURAL

## 2019-04-04 MED ORDER — DIPHENHYDRAMINE HCL 12.5 MG/5ML PO ELIX: 12.5000 mg | ORAL_SOLUTION | ORAL | Status: DC | PRN

## 2019-04-04 MED ORDER — PROPOFOL 10 MG/ML IV BOLUS
INTRAVENOUS | Status: DC | PRN
  Administered 2019-04-04: 130 mg via INTRAVENOUS

## 2019-04-04 MED ORDER — MIDAZOLAM HCL 2 MG/2ML IJ SOLN
INTRAMUSCULAR | Status: AC
  Filled 2019-04-04: qty 2

## 2019-04-04 MED ORDER — ACETAMINOPHEN 500 MG PO TABS
1000.0000 mg | ORAL_TABLET | Freq: Once | ORAL | Status: AC
  Administered 2019-04-04: 1000 mg via ORAL
  Filled 2019-04-04: qty 2

## 2019-04-04 MED ORDER — ONDANSETRON HCL 4 MG PO TABS: 4.0000 mg | ORAL_TABLET | Freq: Three times a day (TID) | ORAL | 0 refills | Status: DC | PRN

## 2019-04-04 MED ORDER — LACTATED RINGERS IV SOLN
INTRAVENOUS | Status: DC
  Administered 2019-04-04 (×2): via INTRAVENOUS

## 2019-04-04 MED ORDER — DEXAMETHASONE SODIUM PHOSPHATE 10 MG/ML IJ SOLN
INTRAMUSCULAR | Status: DC | PRN
  Administered 2019-04-04: 8 mg via INTRAVENOUS

## 2019-04-04 MED ORDER — ACETAMINOPHEN 500 MG PO TABS: 1000.0000 mg | ORAL_TABLET | Freq: Three times a day (TID) | ORAL | 0 refills | Status: AC

## 2019-04-04 MED ORDER — GLYCOPYRROLATE PF 0.2 MG/ML IJ SOSY
PREFILLED_SYRINGE | INTRAMUSCULAR | Status: AC
  Filled 2019-04-04: qty 2

## 2019-04-04 MED ORDER — ONDANSETRON HCL 4 MG/2ML IJ SOLN
INTRAMUSCULAR | Status: AC
  Filled 2019-04-04: qty 4

## 2019-04-04 MED ORDER — PHENOL 1.4 % MT LIQD: 1.0000 | OROMUCOSAL | Status: DC | PRN

## 2019-04-04 MED ORDER — PROPOFOL 10 MG/ML IV BOLUS
INTRAVENOUS | Status: AC
  Filled 2019-04-04: qty 40

## 2019-04-04 MED ORDER — DOCUSATE SODIUM 100 MG PO CAPS
100.0000 mg | ORAL_CAPSULE | Freq: Two times a day (BID) | ORAL | Status: DC
  Administered 2019-04-04 – 2019-04-05 (×2): 100 mg via ORAL
  Filled 2019-04-04 (×2): qty 1

## 2019-04-04 MED ORDER — METHOCARBAMOL 500 MG PO TABS
500.0000 mg | ORAL_TABLET | Freq: Four times a day (QID) | ORAL | Status: DC | PRN
  Administered 2019-04-04 – 2019-04-05 (×3): 500 mg via ORAL
  Filled 2019-04-04 (×3): qty 1

## 2019-04-04 MED ORDER — MIDAZOLAM HCL 5 MG/5ML IJ SOLN
INTRAMUSCULAR | Status: DC | PRN
  Administered 2019-04-04 (×2): 1 mg via INTRAVENOUS

## 2019-04-04 MED ORDER — POVIDONE-IODINE 10 % EX SWAB
2.0000 "application " | Freq: Once | CUTANEOUS | Status: AC
  Administered 2019-04-04: 2 via TOPICAL

## 2019-04-04 MED ORDER — LIDOCAINE 2% (20 MG/ML) 5 ML SYRINGE
INTRAMUSCULAR | Status: AC
  Filled 2019-04-04: qty 10

## 2019-04-04 MED ORDER — SUGAMMADEX SODIUM 200 MG/2ML IV SOLN
INTRAVENOUS | Status: DC | PRN
  Administered 2019-04-04: 200 mg via INTRAVENOUS

## 2019-04-04 MED ORDER — FLUTICASONE PROPIONATE 0.005 % EX OINT: TOPICAL_OINTMENT | Freq: Two times a day (BID) | CUTANEOUS | Status: DC | PRN

## 2019-04-04 MED ORDER — ONDANSETRON HCL 4 MG/2ML IJ SOLN
INTRAMUSCULAR | Status: DC | PRN
  Administered 2019-04-04: 4 mg via INTRAVENOUS

## 2019-04-04 MED ORDER — MEPERIDINE HCL 50 MG/ML IJ SOLN: 6.2500 mg | INTRAMUSCULAR | Status: DC | PRN

## 2019-04-04 MED ORDER — LACTATED RINGERS IV SOLN: INTRAVENOUS | Status: DC

## 2019-04-04 MED ORDER — ACETAMINOPHEN 500 MG PO TABS
1000.0000 mg | ORAL_TABLET | Freq: Three times a day (TID) | ORAL | Status: DC
  Administered 2019-04-04 – 2019-04-05 (×3): 1000 mg via ORAL
  Filled 2019-04-04 (×3): qty 2

## 2019-04-04 MED ORDER — PANTOPRAZOLE SODIUM 40 MG PO TBEC
40.0000 mg | DELAYED_RELEASE_TABLET | Freq: Every day | ORAL | Status: DC
  Administered 2019-04-04: 40 mg via ORAL
  Filled 2019-04-04: qty 1

## 2019-04-04 MED ORDER — ONDANSETRON HCL 4 MG/2ML IJ SOLN: 4.0000 mg | Freq: Four times a day (QID) | INTRAMUSCULAR | Status: DC | PRN

## 2019-04-04 MED ORDER — DEXAMETHASONE SODIUM PHOSPHATE 10 MG/ML IJ SOLN
INTRAMUSCULAR | Status: AC
  Filled 2019-04-04: qty 2

## 2019-04-04 MED ORDER — EPINEPHRINE 1 MG/10ML IJ SOSY
PREFILLED_SYRINGE | INTRAMUSCULAR | Status: AC
  Filled 2019-04-04: qty 10

## 2019-04-04 MED ORDER — AMLODIPINE BESYLATE 5 MG PO TABS
2.5000 mg | ORAL_TABLET | Freq: Every day | ORAL | Status: DC
  Administered 2019-04-04: 2.5 mg via ORAL
  Filled 2019-04-04: qty 1

## 2019-04-04 MED ORDER — SODIUM CHLORIDE 0.9 % IV SOLN
INTRAVENOUS | Status: DC | PRN
  Administered 2019-04-04: 08:00:00 20 ug/min via INTRAVENOUS

## 2019-04-04 MED ORDER — OXYCODONE HCL 5 MG PO TABS: 5.0000 mg | ORAL_TABLET | ORAL | 0 refills | Status: AC | PRN

## 2019-04-04 MED ORDER — FAMOTIDINE 20 MG PO TABS
20.0000 mg | ORAL_TABLET | Freq: Every day | ORAL | Status: DC
  Administered 2019-04-05: 20 mg via ORAL
  Filled 2019-04-04: qty 1

## 2019-04-04 MED ORDER — ROCURONIUM BROMIDE 10 MG/ML (PF) SYRINGE
PREFILLED_SYRINGE | INTRAVENOUS | Status: DC | PRN
  Administered 2019-04-04: 10 mg via INTRAVENOUS
  Administered 2019-04-04: 50 mg via INTRAVENOUS

## 2019-04-04 MED ORDER — METOCLOPRAMIDE HCL 5 MG/ML IJ SOLN: 10.0000 mg | Freq: Once | INTRAMUSCULAR | Status: DC | PRN

## 2019-04-04 MED ORDER — METOCLOPRAMIDE HCL 5 MG PO TABS: 5.0000 mg | ORAL_TABLET | Freq: Three times a day (TID) | ORAL | Status: DC | PRN

## 2019-04-04 MED ORDER — EPHEDRINE 5 MG/ML INJ
INTRAVENOUS | Status: AC
  Filled 2019-04-04: qty 10

## 2019-04-04 MED ORDER — ESMOLOL HCL 100 MG/10ML IV SOLN
INTRAVENOUS | Status: AC
  Filled 2019-04-04: qty 10

## 2019-04-04 MED ORDER — GABAPENTIN 300 MG PO CAPS: 300.0000 mg | ORAL_CAPSULE | Freq: Two times a day (BID) | ORAL | 0 refills | Status: DC | PRN

## 2019-04-04 MED ORDER — OXYCODONE HCL 5 MG PO TABS
5.0000 mg | ORAL_TABLET | ORAL | Status: DC | PRN
  Administered 2019-04-04 – 2019-04-05 (×4): 5 mg via ORAL
  Filled 2019-04-04 (×4): qty 1

## 2019-04-04 MED ORDER — HYDROMORPHONE HCL 1 MG/ML IJ SOLN: 0.5000 mg | INTRAMUSCULAR | Status: DC | PRN

## 2019-04-04 MED ORDER — SCOPOLAMINE 1 MG/3DAYS TD PT72
MEDICATED_PATCH | TRANSDERMAL | Status: AC
  Administered 2019-04-04: 07:00:00
  Filled 2019-04-04: qty 1

## 2019-04-04 MED ORDER — TRANEXAMIC ACID-NACL 1000-0.7 MG/100ML-% IV SOLN
INTRAVENOUS | Status: DC | PRN
  Administered 2019-04-04: 1000 mg via INTRAVENOUS

## 2019-04-04 MED ORDER — ONDANSETRON HCL 4 MG PO TABS: 4.0000 mg | ORAL_TABLET | Freq: Four times a day (QID) | ORAL | Status: DC | PRN

## 2019-04-04 MED ORDER — LEVOTHYROXINE SODIUM 50 MCG PO TABS
50.0000 ug | ORAL_TABLET | Freq: Every day | ORAL | Status: DC
  Administered 2019-04-05: 50 ug via ORAL
  Filled 2019-04-04: qty 1

## 2019-04-04 MED ORDER — CLINDAMYCIN PHOSPHATE 900 MG/50ML IV SOLN
900.0000 mg | INTRAVENOUS | Status: AC
  Administered 2019-04-04: 08:00:00 900 mg via INTRAVENOUS
  Filled 2019-04-04: qty 50

## 2019-04-04 MED ORDER — POLYETHYLENE GLYCOL 3350 17 G PO PACK: 17.0000 g | PACK | Freq: Every day | ORAL | Status: DC | PRN

## 2019-04-04 MED ORDER — TRANEXAMIC ACID-NACL 1000-0.7 MG/100ML-% IV SOLN
INTRAVENOUS | Status: AC
  Filled 2019-04-04: qty 100

## 2019-04-04 MED ORDER — FENTANYL CITRATE (PF) 100 MCG/2ML IJ SOLN
INTRAMUSCULAR | Status: DC | PRN
  Administered 2019-04-04 (×2): 50 ug via INTRAVENOUS

## 2019-04-04 MED ORDER — PANTOPRAZOLE SODIUM 40 MG PO TBEC: 40.0000 mg | DELAYED_RELEASE_TABLET | Freq: Every day | ORAL | Status: DC

## 2019-04-04 MED ORDER — SODIUM CHLORIDE 0.9 % IR SOLN
Status: DC | PRN
  Administered 2019-04-04: 1000 mL

## 2019-04-04 MED ORDER — LIDOCAINE 2% (20 MG/ML) 5 ML SYRINGE
INTRAMUSCULAR | Status: DC | PRN
  Administered 2019-04-04: 60 mg via INTRAVENOUS

## 2019-04-04 MED ORDER — METHOCARBAMOL 500 MG IVPB - SIMPLE MED
500.0000 mg | Freq: Four times a day (QID) | INTRAVENOUS | Status: DC | PRN
  Filled 2019-04-04: qty 50

## 2019-04-04 MED ORDER — CLINDAMYCIN PHOSPHATE 600 MG/50ML IV SOLN
600.0000 mg | Freq: Four times a day (QID) | INTRAVENOUS | Status: AC
  Administered 2019-04-04 – 2019-04-05 (×3): 600 mg via INTRAVENOUS
  Filled 2019-04-04 (×4): qty 50

## 2019-04-04 MED ORDER — WATER FOR IRRIGATION, STERILE IR SOLN
Status: DC | PRN
  Administered 2019-04-04: 2000 mL

## 2019-04-04 MED ORDER — EPHEDRINE SULFATE-NACL 50-0.9 MG/10ML-% IV SOSY
PREFILLED_SYRINGE | INTRAVENOUS | Status: DC | PRN
  Administered 2019-04-04: 10 mg via INTRAVENOUS
  Administered 2019-04-04 (×2): 5 mg via INTRAVENOUS

## 2019-04-04 MED ORDER — ENOXAPARIN SODIUM 40 MG/0.4ML ~~LOC~~ SOLN
40.0000 mg | SUBCUTANEOUS | Status: DC
  Administered 2019-04-05: 40 mg via SUBCUTANEOUS
  Filled 2019-04-04: qty 0.4

## 2019-04-04 MED ORDER — BACLOFEN 10 MG PO TABS: 10.0000 mg | ORAL_TABLET | Freq: Two times a day (BID) | ORAL | 0 refills | Status: DC | PRN

## 2019-04-04 MED ORDER — ACETAMINOPHEN 325 MG PO TABS: 325.0000 mg | ORAL_TABLET | Freq: Four times a day (QID) | ORAL | Status: DC | PRN

## 2019-04-04 MED ORDER — FLUTICASONE PROPIONATE 0.05 % EX CREA: 1.0000 "application " | TOPICAL_CREAM | Freq: Two times a day (BID) | CUTANEOUS | Status: DC | PRN

## 2019-04-04 MED ORDER — BUPIVACAINE-EPINEPHRINE (PF) 0.5% -1:200000 IJ SOLN
INTRAMUSCULAR | Status: DC | PRN
  Administered 2019-04-04: 20 mL via PERINEURAL

## 2019-04-04 MED ORDER — GABAPENTIN 300 MG PO CAPS
300.0000 mg | ORAL_CAPSULE | Freq: Once | ORAL | Status: AC
  Administered 2019-04-04: 300 mg via ORAL
  Filled 2019-04-04: qty 1

## 2019-04-04 MED ORDER — ROCURONIUM BROMIDE 10 MG/ML (PF) SYRINGE
PREFILLED_SYRINGE | INTRAVENOUS | Status: AC
  Filled 2019-04-04: qty 20

## 2019-04-04 MED ORDER — MENTHOL 3 MG MT LOZG: 1.0000 | LOZENGE | OROMUCOSAL | Status: DC | PRN

## 2019-04-04 MED ORDER — FENTANYL CITRATE (PF) 100 MCG/2ML IJ SOLN
INTRAMUSCULAR | Status: AC
  Filled 2019-04-04: qty 2

## 2019-04-04 MED ORDER — PHENYLEPHRINE HCL (PRESSORS) 10 MG/ML IV SOLN
INTRAVENOUS | Status: AC
  Filled 2019-04-04: qty 2

## 2019-04-04 SURGICAL SUPPLY — 62 items
BASEPLATE GLENOSPHERE 25 (Plate) ×1 IMPLANT
BASEPLATE GLENOSPHERE 25MM (Plate) ×1 IMPLANT
BEARING HUMERAL SHLDER 36M STD (Shoulder) IMPLANT
BIT DRILL F/CENTRAL SCRW 3.2 (BIT) ×1
BIT DRILL F/CENTRAL SCRW 3.2MM (BIT) IMPLANT
BIT DRILL TWIST 2.7 (BIT) ×1 IMPLANT
BIT DRILL TWIST 2.7MM (BIT) ×1
BLADE SAW SAG 73X25 THK (BLADE) ×2
BLADE SAW SGTL 73X25 THK (BLADE) ×1 IMPLANT
BLADE SURG SZ10 CARB STEEL (BLADE) ×6 IMPLANT
BNDG ELASTIC 3X5.8 VLCR STR LF (GAUZE/BANDAGES/DRESSINGS) ×2 IMPLANT
BNDG ELASTIC 4X5.8 VLCR STR LF (GAUZE/BANDAGES/DRESSINGS) ×2 IMPLANT
CHLORAPREP W/TINT 26 (MISCELLANEOUS) ×5 IMPLANT
CLOSURE STERI-STRIP 1/2X4 (GAUZE/BANDAGES/DRESSINGS) ×1
CLSR STERI-STRIP ANTIMIC 1/2X4 (GAUZE/BANDAGES/DRESSINGS) ×2 IMPLANT
COVER SURGICAL LIGHT HANDLE (MISCELLANEOUS) ×3 IMPLANT
COVER WAND RF STERILE (DRAPES) IMPLANT
DRAPE INCISE IOBAN 66X45 STRL (DRAPES) ×3 IMPLANT
DRAPE U-SHAPE 47X51 STRL (DRAPES) ×3 IMPLANT
DRILL BIT F/CENTRAL SCRW 3.2MM (BIT) ×2
DRSG ADAPTIC 3X8 NADH LF (GAUZE/BANDAGES/DRESSINGS) ×3 IMPLANT
DRSG MEPILEX BORDER 4X8 (GAUZE/BANDAGES/DRESSINGS) ×2 IMPLANT
ELECT REM PT RETURN 15FT ADLT (MISCELLANEOUS) ×3 IMPLANT
GAUZE SPONGE 4X4 12PLY STRL (GAUZE/BANDAGES/DRESSINGS) ×3 IMPLANT
GLENOID SPHERE STD STRL 36MM (Orthopedic Implant) ×2 IMPLANT
GLOVE BIO SURGEON STRL SZ7.5 (GLOVE) ×6 IMPLANT
GLOVE BIOGEL PI IND STRL 8 (GLOVE) ×2 IMPLANT
GLOVE BIOGEL PI INDICATOR 8 (GLOVE) ×4
GOWN STRL REUS W/ TWL LRG LVL3 (GOWN DISPOSABLE) ×2 IMPLANT
GOWN STRL REUS W/TWL LRG LVL3 (GOWN DISPOSABLE) ×4
KIT BASIN OR (CUSTOM PROCEDURE TRAY) ×3 IMPLANT
KIT TURNOVER KIT A (KITS) IMPLANT
MANIFOLD NEPTUNE II (INSTRUMENTS) ×3 IMPLANT
NS IRRIG 1000ML POUR BTL (IV SOLUTION) ×3 IMPLANT
PACK SHOULDER (CUSTOM PROCEDURE TRAY) ×3 IMPLANT
PIN STEINMANN THREADED TIP (PIN) ×2 IMPLANT
RESTRAINT HEAD UNIVERSAL NS (MISCELLANEOUS) ×3 IMPLANT
SCREW CENTRAL 6.5X20MM (Screw) ×2 IMPLANT
SCREW LOCKING 4.75MMX15MM (Screw) ×4 IMPLANT
SCREW LOCKING NS 4.75MMX20MM (Screw) ×4 IMPLANT
SHOULDER HUMERAL BEAR 36M STD (Shoulder) ×3 IMPLANT
SLING ARM FOAM STRAP LRG (SOFTGOODS) ×2 IMPLANT
SLING ARM IMMOBILIZER LRG (SOFTGOODS) ×3 IMPLANT
SLING ARM IMMOBILIZER MED (SOFTGOODS) IMPLANT
SMARTMIX MINI TOWER (MISCELLANEOUS) ×3
SPONGE LAP 18X18 X RAY DECT (DISPOSABLE) ×3 IMPLANT
STEM HUMERAL STRL 11MMX55MM (Stem) ×2 IMPLANT
SUCTION FRAZIER HANDLE 10FR (MISCELLANEOUS) ×2
SUCTION TUBE FRAZIER 10FR DISP (MISCELLANEOUS) ×1 IMPLANT
SUPPORT WRAP ARM LG (MISCELLANEOUS) IMPLANT
SUT FIBERWIRE #2 38 T-5 BLUE (SUTURE) ×6
SUT MNCRL AB 4-0 PS2 18 (SUTURE) ×3 IMPLANT
SUT VIC AB 0 CT1 27 (SUTURE) ×2
SUT VIC AB 0 CT1 27XBRD ANBCTR (SUTURE) ×1 IMPLANT
SUT VIC AB 2-0 CT1 27 (SUTURE) ×2
SUT VIC AB 2-0 CT1 TAPERPNT 27 (SUTURE) ×1 IMPLANT
SUTURE FIBERWR #2 38 T-5 BLUE (SUTURE) ×2 IMPLANT
TOWEL OR 17X26 10 PK STRL BLUE (TOWEL DISPOSABLE) ×6 IMPLANT
TOWER SMARTMIX MINI (MISCELLANEOUS) ×1 IMPLANT
TRAY FOLEY MTR SLVR 14FR STAT (SET/KITS/TRAYS/PACK) IMPLANT
TRAY HUM MINI SHOULDER +0 40D (Shoulder) ×2 IMPLANT
WATER STERILE IRR 1000ML POUR (IV SOLUTION) ×3 IMPLANT

## 2019-04-04 NOTE — Interval H&P Note (Signed)
I participated in the care of this patient and agree with the above history, physical and evaluation. I performed a review of the history and a physical exam as detailed   Timothy Daniel Murphy MD  

## 2019-04-04 NOTE — Anesthesia Procedure Notes (Signed)
Anesthesia Regional Block: Interscalene brachial plexus block   Pre-Anesthetic Checklist: ,, timeout performed, Correct Patient, Correct Site, Correct Laterality, Correct Procedure, Correct Position, site marked, Risks and benefits discussed,  Surgical consent,  Pre-op evaluation,  At surgeon's request and post-op pain management  Laterality: Left  Prep: chloraprep       Needles:  Injection technique: Single-shot  Needle Type: Echogenic Stimulator Needle     Needle Length: 9cm  Needle Gauge: 21   Needle insertion depth: 5 cm   Additional Needles:   Narrative:  Start time: 04/04/2019 7:17 AM End time: 04/04/2019 7:21 AM Injection made incrementally with aspirations every 5 mL.  Performed by: Personally  Anesthesiologist: Josephine Igo, MD  Additional Notes: Timeout performed. Patient sedated. Relevant anatomy ID'd using [place]. Incremental 2-68ml injection of LA with frequent aspiration. Patient tolerated procedure well.        Left Interscalene Block

## 2019-04-04 NOTE — Anesthesia Postprocedure Evaluation (Signed)
Anesthesia Post Note  Patient: Erin Vasquez  Procedure(s) Performed: TOTAL SHOULDER ARTHROPLASTY reverse (Left Shoulder)     Patient location during evaluation: PACU Anesthesia Type: General Level of consciousness: awake and alert and oriented Pain management: pain level controlled Vital Signs Assessment: post-procedure vital signs reviewed and stable Respiratory status: spontaneous breathing, nonlabored ventilation and respiratory function stable Cardiovascular status: blood pressure returned to baseline and stable Postop Assessment: no apparent nausea or vomiting Anesthetic complications: no    Last Vitals:  Vitals:   04/04/19 1015 04/04/19 1030  BP: 130/70 126/69  Pulse: 65 63  Resp: 13 15  Temp:  36.7 C  SpO2: 96% 96%    Last Pain:  Vitals:   04/04/19 1030  TempSrc:   PainSc: 0-No pain                 Tondalaya Perren A.

## 2019-04-04 NOTE — Discharge Instructions (Signed)
Keep sling on when out of bed.  Prop arm on pillows to elevate.  Open and close hand frequently to reduce swelling.  Do not bear weight with arm. Keep your dressing on and dry until follow up. Take pain medicine as needed with the goal of transitioning to over the counter medicines.  Stop breakthrough medication as soon as you are able.  INSTRUCTIONS AFTER JOINT REPLACEMENT   o Remove items at home which could result in a fall. This includes throw rugs or furniture in walking pathways o ICE to the affected joint every three hours while awake for 30 minutes at a time, for at least the first 3-5 days, and then as needed for pain and swelling.  Continue to use ice for pain and swelling. You may notice swelling that will progress down to the foot and ankle.  This is normal after surgery.  Elevate your leg when you are not up walking on it.   o Continue to use the breathing machine you got in the hospital (incentive spirometer) which will help keep your temperature down.  It is common for your temperature to cycle up and down following surgery, especially at night when you are not up moving around and exerting yourself.  The breathing machine keeps your lungs expanded and your temperature down.   DIET:  As you were doing prior to hospitalization, we recommend a well-balanced diet.  DRESSING / WOUND CARE / SHOWERING  Keep dressing dry.  You may use an occlusive plastic wrap (Press'n Seal for example) with blue painter's tape at edges, NO SOAKING/SUBMERGING IN THE BATHTUB.  If the bandage gets wet, change with a clean dry gauze.  If the incision gets wet, pat the wound dry with a clean towel.  ACTIVITY  o Increase activity slowly as tolerated, but follow the weight bearing instructions below.   o No driving for 6 weeks or until further direction given by your physician.  You cannot drive while taking narcotics.  o No lifting or carrying greater than 10 lbs. until further directed by your  surgeon. o Avoid periods of inactivity such as sitting longer than an hour when not asleep. This helps prevent blood clots.  o You may return to work once you are authorized by your doctor.     WEIGHT BEARING  Do not bear weight with arm.  Maintain sling at all times.  A rehabilitation program following joint replacement surgery can speed recovery and prevent re-injury in the future due to weakened muscles. Contact your doctor or a physical therapist for more information on knee rehabilitation.    CONSTIPATION  Constipation is defined medically as fewer than three stools per week and severe constipation as less than one stool per week.  Even if you have a regular bowel pattern at home, your normal regimen is likely to be disrupted due to multiple reasons following surgery.  Combination of anesthesia, postoperative narcotics, change in appetite and fluid intake all can affect your bowels.   YOU MUST use at least one of the following options; they are listed in order of increasing strength to get the job done.  They are all available over the counter, and you may need to use some, POSSIBLY even all of these options:    Drink plenty of fluids (prune juice may be helpful) and high fiber foods Colace 100 mg by mouth twice a day  Senokot for constipation as directed and as needed Dulcolax (bisacodyl), take with full glass of water  Miralax (polyethylene glycol) once or twice a day as needed.  If you have tried all these things and are unable to have a bowel movement in the first 3-4 days after surgery call either your surgeon or your primary doctor.    If you experience loose stools or diarrhea, hold the medications until you stool forms back up.  If your symptoms do not get better within 1 week or if they get worse, check with your doctor.  If you experience "the worst abdominal pain ever" or develop nausea or vomiting, please contact the office immediately for further recommendations for  treatment.   ITCHING:  If you experience itching with your medications, try taking only a single pain pill, or even half a pain pill at a time.  You can also use Benadryl over the counter for itching or also to help with sleep.   TED HOSE STOCKINGS:  Use stockings on both legs until for at least 2 weeks or as directed by physician office. They may be removed at night for sleeping.  MEDICATIONS:  See your medication summary on the After Visit Summary that nursing will review with you.  You may have some home medications which will be placed on hold until you complete the course of blood thinner medication.  It is important for you to complete the blood thinner medication as prescribed.  PRECAUTIONS:  If you experience chest pain or shortness of breath - call 911 immediately for transfer to the hospital emergency department.   If you develop a fever greater that 101 F, purulent drainage from wound, increased redness or drainage from wound, foul odor from the wound/dressing, or calf pain - CONTACT YOUR SURGEON.                                                   FOLLOW-UP APPOINTMENTS:  If you do not already have a post-op appointment, please call the office for an appointment to be seen by your surgeon.  Guidelines for how soon to be seen are listed in your After Visit Summary, but are typically between 1-4 weeks after surgery.  OTHER INSTRUCTIONS:     MAKE SURE YOU:   Understand these instructions.   Get help right away if you are not doing well or get worse.    Thank you for letting us be a part of your medical care team.  It is a privilege we respect greatly.  We hope these instructions will help you stay on track for a fast and full recovery!

## 2019-04-04 NOTE — Op Note (Signed)
04/04/2019  9:08 AM  PATIENT:  Erin Vasquez    PRE-OPERATIVE DIAGNOSIS:  OA LEFT SHOULDER  POST-OPERATIVE DIAGNOSIS:  Same  PROCEDURE:  TOTAL SHOULDER ARTHROPLASTY reverse  SURGEON:  Renette Butters, MD  PHYSICIAN ASSISTANT: Roxan Hockey, PA-C, he was present and scrubbed throughout the case, critical for completion in a timely fashion, and for retraction, instrumentation, and closure.   ANESTHESIA:   General  PREOPERATIVE INDICATIONS:  [name] is a  63 y.o. female with a diagnosis of OA LEFT SHOULDER who failed conservative measures and elected for surgical management.    The risks benefits and alternatives were discussed with the patient preoperatively including but not limited to the risks of infection, bleeding, nerve injury, cardiopulmonary complications, the need for revision surgery, dislocation, brachial plexus palsy, incomplete relief of pain, among others, and the patient was willing to proceed.  OPERATIVE IMPLANTS: Biomet size 11 micro humeral stem press-fit standard with a 36standard+0 poly shoulder arthroplasty tray and a 25 mm glenosphere baseplate and 4 locking screws and one central nonlocking screw.  OPERATIVE FINDINGS: severe GH cuff deficient OA  OPERATIVE PROCEDURE: The patient was brought to the operating room and placed in the supine position. General anesthesia was administered. IV antibiotics were given. A Foley was placed. Time out was performed. The upper extremity was prepped and draped in usual sterile fashion. The patient was in a beachchair position. Deltopectoral approach was carried out. The biceps was tenodesed to the pectoralis tendon with #2 Fiberwire. The subscapularis was released off of the bone.   I then performed circumferential releases of the humerus, and then dislocated the head, and then reamed with the reamer to the above named size.  I then applied the jig, and cut the humeral head in 30 of retroversion, and then turned my  attention to the glenoid.  Deep retractors were placed, and I resected the labrum, and then placed a guidepin into the center position on the glenoid, with slight inferior inclination. I then reamed over the guidepin, and this created a small metaphyseal cancellus blush inferiorly, removing just the cartilage to the subchondral bone superiorly. The base plate was selected and impacted place, and then I secured it centrally with a nonlocking screw, and I had excellent purchase both inferiorly and superiorly. I placed a short locking screws on anterior and posterior aspects.  I then turned my attention to the glenosphere, and impacted this into place, placing slight inferior offset (set on B).   The glenoid sphere was completely seated, and had engagement of the [facility] taper. I then turned my attention back to the humerus.  I sequentially broached, and then trialed, and was found to restore soft tissue tension, and it had 2 finger tightness. Therefore the above named components were selected. The shoulder felt stable throughout functional motion.  I then impacted the real prosthesis into place, as well as the real humeral tray, and reduced the shoulder. The shoulder had excellent motion, and was stable, and I irrigated the wounds copiously.   I then irrigated the shoulder copiously once more, repaired the deltopectoral interval with Vicryl followed by subcutaneous Vicryl with Steri-Strips and sterile gauze for the skin. The patient was awakened and returned back in stable and satisfactory condition. There no complications and She tolerated the procedure well.  POST OP PLAN: sling full time for 1wk. Mobilize for dvt px

## 2019-04-04 NOTE — Transfer of Care (Signed)
Immediate Anesthesia Transfer of Care Note  Patient: Erin Vasquez  Procedure(s) Performed: Procedure(s): TOTAL SHOULDER ARTHROPLASTY reverse (Left)  Patient Location: PACU  Anesthesia Type:General  Level of Consciousness:  sedated, patient cooperative and responds to stimulation  Airway & Oxygen Therapy:Patient Spontanous Breathing and Patient connected to face mask oxgen  Post-op Assessment:  Report given to PACU RN and Post -op Vital signs reviewed and stable  Post vital signs:  Reviewed and stable  Last Vitals:  Vitals:   04/04/19 0717 04/04/19 0718  BP:    Pulse: 61 63  Resp: 11 14  Temp:    SpO2: 073% 710%    Complications: No apparent anesthesia complications

## 2019-04-04 NOTE — Anesthesia Procedure Notes (Signed)
Procedure Name: Intubation Date/Time: 04/04/2019 7:52 AM Performed by: Lavina Hamman, CRNA Pre-anesthesia Checklist: Patient identified, Emergency Drugs available, Suction available, Patient being monitored and Timeout performed Patient Re-evaluated:Patient Re-evaluated prior to induction Oxygen Delivery Method: Circle system utilized Preoxygenation: Pre-oxygenation with 100% oxygen Induction Type: IV induction Ventilation: Mask ventilation without difficulty Laryngoscope Size: Mac and 3 Grade View: Grade I Tube type: Oral Tube size: 7.0 mm Number of attempts: 1 Airway Equipment and Method: Stylet Placement Confirmation: ETT inserted through vocal cords under direct vision,  positive ETCO2,  CO2 detector and breath sounds checked- equal and bilateral Secured at: 21 cm Tube secured with: Tape Dental Injury: Teeth and Oropharynx as per pre-operative assessment

## 2019-04-05 NOTE — Plan of Care (Signed)
  Problem: Clinical Measurements: Goal: Ability to maintain clinical measurements within normal limits will improve Outcome: Adequate for Discharge Goal: Will remain free from infection Outcome: Adequate for Discharge Goal: Diagnostic test results will improve Outcome: Adequate for Discharge   Problem: Activity: Goal: Risk for activity intolerance will decrease Outcome: Adequate for Discharge   Problem: Nutrition: Goal: Adequate nutrition will be maintained Outcome: Adequate for Discharge   Problem: Elimination: Goal: Will not experience complications related to bowel motility Outcome: Adequate for Discharge Goal: Will not experience complications related to urinary retention Outcome: Adequate for Discharge   Problem: Pain Managment: Goal: General experience of comfort will improve Outcome: Adequate for Discharge   Problem: Safety: Goal: Ability to remain free from injury will improve Outcome: Adequate for Discharge   Problem: Skin Integrity: Goal: Risk for impaired skin integrity will decrease Outcome: Adequate for Discharge   Problem: Education: Goal: Knowledge of the prescribed therapeutic regimen will improve Outcome: Adequate for Discharge Goal: Understanding of activity limitations/precautions following surgery will improve Outcome: Adequate for Discharge   Problem: Activity: Goal: Ability to tolerate increased activity will improve Outcome: Adequate for Discharge   Problem: Pain Management: Goal: Pain level will decrease with appropriate interventions Outcome: Adequate for Discharge  Home with SO today. Discharge teaching done, written information given.

## 2019-04-05 NOTE — Progress Notes (Signed)
Subjective: Erin Vasquez is a 63 y.o. is seen today in office s/p right foot Lapidus, Akin bunionectomy, second metatarsal osteotomy, tailor's bunion correction preformed on 10/12/2018.  She states that she is doing well she denies any significant pain to the bunion and she states it looks better than the prior surgery however we discussed the bunion did have reoccurrence of the relationship to the first metatarsal.  She gets some pain in the foot mostly along the plantar medial aspect.  She states is depending what shoes she wears.  She still using a bone stimulator. Denies any fevers, chills, nausea, vomiting.  No calf pain, chest pain, shortness of breath.  She is to be having shoulder surgery likely coming out.  Objective: General: No acute distress, AAOx3  DP/PT pulses palpable 2/4, CRT < 3 sec to all digits.  Protective sensation intact. Motor function intact.  RIGHT foot: Incision is well coapted without any evidence of scar is well formed. There is no significant discomfort palpation at surgical site.  Particularly on the Lapidus bunionectomy there is no pain.  There is minimal edema to the foot.  There is no erythema or warmth.  Bony prominence present on the plantar first metatarsal Joint.  Unable to Palpate Any Hardware. No pain with calf compression, swelling, warmth, erythema. Assessment and Plan:  Status post right foot surgery  -Treatment options discussed including all alternatives, risks, and complications -X-rays were reviewed.  Stable is broken but not change position.  Unfortunately reoccurrence of the bunion deformity.   -I reviewed the x-rays with her and we had a long discussion regards to treatment options.  I discussed with her revision bunion surgery.  However she has other issues going on now that she wants to have taking care of.  In the meantime we discussed shoe modifications and orthotics to offload.  Continue bone stimulator.  Trula Slade DPM

## 2019-04-05 NOTE — Evaluation (Signed)
Occupational Therapy Evaluation Patient Details Name: Erin Vasquez MRN: [id number] DOB: [birth year] Today's Date: 04/05/2019    History of Present Illness 63 year old female admitted for L TSA.  PMH:  L shoulder sx, gastric bypass, PE   Clinical Impression   Pt was admitted for the above surgery. All education was completed. Pt will follow up with Dr [name] for further rehab.    Follow Up Recommendations  Follow surgeon's recommendation for DC plan and follow-up therapies    Equipment Recommendations  None recommended by OT    Recommendations for Other Services       Precautions / Restrictions Precautions Precautions: Shoulder;Fall Type of Shoulder Precautions: sling on at all times except for bathing, dressing and exercise.  NO shoulder ROM, Elbow to finger ROM allowed Shoulder Interventions: Shoulder sling/immobilizer Precaution Booklet Issued: No Restrictions Weight Bearing Restrictions: Yes LUE Weight Bearing: Non weight bearing      Mobility Bed Mobility Overal bed mobility: Modified Independent                Transfers Overall transfer level: Independent                    Balance                                           ADL either performed or assessed with clinical judgement   ADL Overall ADL's : Needs assistance/impaired Eating/Feeding: Set up   Grooming: Wash/dry hands;Supervision/safety;Standing   Upper Body Bathing: Moderate assistance   Lower Body Bathing: Minimal assistance   Upper Body Dressing : Moderate assistance   Lower Body Dressing: Maximal assistance   Toilet Transfer: Supervision/safety;Ambulation;Comfort height toilet   Toileting- Clothing Manipulation and Hygiene: Modified independent         General ADL Comments: performed adl and toileting.  Educated on elbow to finger ROM, no shoulder ROM and shoulder protocol.  Rewrapped top of ace wrap as there was a gap in back of upper arm. Pt  verbalizes understanding of all. She is unable to move elbow due to block     Vision         Perception     Praxis      Pertinent Vitals/Pain Pain Assessment: 0-10 Pain Score: 3  Pain Location: left shoulder Pain Descriptors / Indicators: Sore Pain Intervention(s): Limited activity within patient's tolerance;Monitored during session;Repositioned;Ice applied     Hand Dominance Left   Extremity/Trunk Assessment Upper Extremity Assessment Upper Extremity Assessment: LUE deficits/detail(able to move fingers and wrist)           Communication Communication Communication: No difficulties   Cognition Arousal/Alertness: Awake/alert Behavior During Therapy: WFL for tasks assessed/performed Overall Cognitive Status: Within Functional Limits for tasks assessed                                     General Comments       Exercises     Shoulder Instructions      Home Living Family/patient expects to be discharged to:: Private residence Living Arrangements: Spouse/significant other                 Bathroom Shower/Tub: Teacher, early years/pre: Standard     Home Equipment: Grab bars - tub/shower;Other (comment);Shower seat;Walker - 2 wheels;Bedside  commode   Additional Comments: finance will help at home. He works 54 - 2 am      Prior Functioning/Environment Level of Independence: Independent                 OT Problem List:        OT Treatment/Interventions:      OT Goals(Current goals can be found in the care plan section) Acute Rehab OT Goals Patient Stated Goal: none stated OT Goal Formulation: All assessment and education complete, DC therapy  OT Frequency:     Barriers to D/C:            Co-evaluation              AM-PAC OT "6 Clicks" Daily Activity     Outcome Measure Help from another person eating meals?: A Little Help from another person taking care of personal grooming?: A Little Help from another  person toileting, which includes using toliet, bedpan, or urinal?: A Little Help from another person bathing (including washing, rinsing, drying)?: A Little Help from another person to put on and taking off regular upper body clothing?: A Lot Help from another person to put on and taking off regular lower body clothing?: A Lot 6 Click Score: 16   End of Session Nurse Communication: (ready for d/c)  Activity Tolerance: Patient tolerated treatment well Patient left: in bed;with call bell/phone within reach  OT Visit Diagnosis: Pain Pain - Right/Left: Left Pain - part of body: Shoulder                Time: 7092-9574 OT Time Calculation (min): 35 min Charges:  OT General Charges $OT Visit: 1 Visit OT Evaluation $OT Eval Low Complexity: 1 Low OT Treatments $Self Care/Home Management : 8-22 mins  Lesle Chris, OTR/L Acute Rehabilitation Services [phone] [facility] pager [phone] office 04/05/2019  [place] 04/05/2019, 9:02 AM

## 2019-04-05 NOTE — Progress Notes (Signed)
    Subjective: Patient reports pain as mild to moderate, controlled.  Tolerating diet.  Urinating. No CP, SOB.  Mobilizing with help.  Objective:   VITALS:   Vitals:   04/04/19 1541 04/04/19 2109 04/05/19 0139 04/05/19 0431  BP: 111/67 127/66 114/60 129/70  Pulse: 67 63 (!) 57 62  Resp: 16 16 16 18   Temp: 97.9 F (36.6 C) 97.7 F (36.5 C) 98 F (36.7 C) 98.2 F (36.8 C)  TempSrc: Oral Oral    SpO2: 95% 93% 95% 94%  Weight:      Height:       CBC Latest Ref Rng & Units 04/04/2019 03/27/2019 09/20/2018  WBC 4.0 - 10.5 K/uL 11.2(H) 4.8 6.6  Hemoglobin 12.0 - 15.0 g/dL 11.9(L) 13.0 13.4  Hematocrit 36.0 - 46.0 % 38.7 41.6 39.4  Platelets 150 - 400 K/uL 238 257 314   BMP Latest Ref Rng & Units 04/04/2019 03/27/2019 09/20/2018  Glucose 70 - 99 mg/dL - 83 78  BUN 8 - 23 mg/dL - 16 17  Creatinine 0.44 - 1.00 mg/dL 0.68 0.65 0.68  BUN/Creat Ratio 6 - 22 (calc) - - NOT APPLICABLE  Sodium 440 - 145 mmol/L - 142 143  Potassium 3.5 - 5.1 mmol/L - 4.1 4.0  Chloride 98 - 111 mmol/L - 107 106  CO2 22 - 32 mmol/L - 25 29  Calcium 8.9 - 10.3 mg/dL - 8.9 9.1   Intake/Output      07/21 0701 - 07/22 0700 07/22 0701 - 07/23 0700   P.O. 120    I.V. (mL/kg) 2366.4 (32.6)    IV Piggyback 150    Total Intake(mL/kg) 2636.4 (36.4)    Urine (mL/kg/hr) 1700 (1)    Stool 0    Blood 50    Total Output 1750    Net +886.4         Urine Occurrence 402 x    Stool Occurrence 0 x       Physical Exam: General: NAD.  Upright in bed.  Calm, conversant. Resp: No increased wob Cardio: regular rate and rhythm ABD soft Neurologically intact MSK LUE: Sling in place Neurovascularly intact distally.  Nerve block still seems to limit upper motor function. Sensation intact distally Intact pulses distally Incision: dressing C/D/I   Assessment: 1 Day Post-Op  S/P Procedure(s) (LRB): TOTAL SHOULDER ARTHROPLASTY reverse (Left) by Dr. Ernesta Amble. 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  Plan: Advance diet Up with OT D/C IV fluids Incentive Spirometry Sling, Apply ice   Weight Bearing: Non Weight Bearing (NWB) LUE Dressings: Maintain Dressing.   VTE prophylaxis: Lovenox, SCDs, ambulation  Dispo: Home today   Erin Burly III, PA-C 04/05/2019, 7:47 AM

## 2019-04-05 NOTE — Discharge Summary (Signed)
Discharge Summary  Patient ID: Erin Vasquez MRN: [id number] DOB/AGE: May 08, 1956 63 y.o.  Admit date: 04/04/2019 Discharge date: 04/05/2019  Admission Diagnoses:  Secondary osteoarthritis of left shoulder due to rotator cuff arthropathy  Discharge Diagnoses:  Principal Problem:   Secondary osteoarthritis of left shoulder due to rotator cuff arthropathy Active Problems:   GERD (gastroesophageal reflux disease)   Essential hypertension   H/O gastric bypass   History of endometrial cancer   History of esophageal stricture   Lupus anticoagulant disorder (HCC)   Vitamin D deficiency   Past Medical History:  Diagnosis Date  . Anemia    during pregnancy  . Anxiety   . Arthritis    "all over" (07/07/2017)  . Bilateral pulmonary embolism ([place]) 10/29/2015   "from my left ankle"  . Childhood asthma    as child, nothing as adult  . Chronic back pain    "all over" (07/07/2017)  . DVT (deep venous thrombosis) ([place]) 10/29/2015   "from my left ankle"  . GERD (gastroesophageal reflux disease)    tx. Tums  . H/O hiatal hernia    "repaired w/gastric bypass OR" (07/07/2017)  . History of esophageal dilatation   . Hypertension   . Hypothyroidism   . Lupus anticoagulant disorder (HCC)    Dr. Alen Blew   . Neuromuscular disorder ([place])    L thigh - Numbness, nerve damage    . Sacroiliac pain    "left thigh has been numb since 01/2016 after appendectomy" (07/07/2017)  . Sleep apnea    07/07/2017 "bariatric dr says I don't need CPAP anymore" (07/07/2017)  . Thyroid disease   . Uterine cancer ([place]) 01/2014  . Varicose veins   . Ventral hernia   . Vitamin D deficiency     Surgeries: Procedure(s): TOTAL SHOULDER ARTHROPLASTY reverse on 04/04/2019   Consultants (if any):   Discharged Condition: Improved  Hospital Course: Erin Vasquez is an 63 y.o. female who was admitted 04/04/2019 with a diagnosis of Secondary osteoarthritis of left shoulder due to rotator cuff arthropathy and went  to the operating room on 04/04/2019 and underwent the above named procedures.    She was given perioperative antibiotics:  Anti-infectives (From admission, onward)   Start     Dose/Rate Route Frequency Ordered Stop   04/04/19 1400  clindamycin (CLEOCIN) IVPB 600 mg     600 mg 100 mL/hr over 30 Minutes Intravenous Every 6 hours 04/04/19 1141 04/05/19 0330   04/04/19 0600  clindamycin (CLEOCIN) IVPB 900 mg     900 mg 100 mL/hr over 30 Minutes Intravenous On call to O.R. 04/04/19 1586 04/04/19 0736    .  She was given sequential compression devices, early ambulation, and Lovenox for DVT prophylaxis.  She benefited maximally from the hospital stay and there were no complications.    Recent vital signs:  Vitals:   04/05/19 0139 04/05/19 0431  BP: 114/60 129/70  Pulse: (!) 57 62  Resp: 16 18  Temp: 98 F (36.7 C) 98.2 F (36.8 C)  SpO2: 95% 94%    Recent laboratory studies:  Lab Results  Component Value Date   HGB 11.9 (L) 04/04/2019   HGB 13.0 03/27/2019   HGB 13.4 09/20/2018   Lab Results  Component Value Date   WBC 11.2 (H) 04/04/2019   PLT 238 04/04/2019   Lab Results  Component Value Date   INR 1.30 03/12/2016   Lab Results  Component Value Date   NA 142 03/27/2019   K 4.1 03/27/2019  CL 107 03/27/2019   CO2 25 03/27/2019   BUN 16 03/27/2019   CREATININE 0.68 04/04/2019   GLUCOSE 83 03/27/2019    Discharge Medications:   Allergies as of 04/05/2019      Reactions   Bee Venom Swelling, Rash, Other (See Comments)   Patient is allergic to bee venom, mosquitos, wasps, yellow jackets and bees. Localized swelling Fever     Hymenoptera Venom Preparations Rash, Other (See Comments)   Patient is allergic to bee venom, mosquitos, wasps, yellow jackets and bees. Localized swelling Fever   Other Anaphylaxis, Shortness Of Breath, Itching, Rash   HICKORY SMOKE   Penicillins Anaphylaxis, Other (See Comments)   Did it involve swelling of the face/tongue/throat,  SOB, or low BP? Yes Did it involve sudden or severe rash/hives, skin peeling, or any reaction on the inside of your mouth or nose? No Did you need to seek medical attention at a hospital or doctor's office? Yes When did it last happen?childhood allergy If all above answers are "NO", may proceed with cephalosporin use.   Latex Rash   Latex rash due to elastic in zip-up TED hose at home (local rash), pt. Has had flu vaccine before without problems.   Morphine And Related Itching, Swelling   Plastibase Rash   Tangerine Flavor Rash      Medication List    STOP taking these medications   enoxaparin 40 MG/0.4ML injection Commonly known as: LOVENOX     TAKE these medications   acetaminophen 500 MG tablet Commonly known as: TYLENOL Take 2 tablets (1,000 mg total) by mouth every 8 (eight) hours for 10 days. For Pain.   amLODipine 2.5 MG tablet Commonly known as: NORVASC Take 2.5 mg by mouth at bedtime.   baclofen 10 MG tablet Commonly known as: LIORESAL Take 1 tablet (10 mg total) by mouth 2 (two) times daily as needed for muscle spasms.   CALTRATE 600+D PO Take 1 tablet by mouth daily.   famotidine 20 MG tablet Commonly known as: PEPCID Take 20 mg by mouth daily.   fluticasone 0.05 % cream Commonly known as: CUTIVATE Apply 1 application topically 2 (two) times daily as needed (rash).   gabapentin 300 MG capsule Commonly known as: Neurontin Take 1 capsule (300 mg total) by mouth 2 (two) times daily as needed for up to 15 days. For 2 weeks post op for pain.   levothyroxine 50 MCG tablet Commonly known as: SYNTHROID Take 50 mcg by mouth daily before breakfast.   multivitamin with minerals Tabs tablet Take 1 tablet by mouth 2 (two) times daily.   omeprazole 40 MG capsule Commonly known as: PRILOSEC Take 40 mg by mouth every evening.   ondansetron 4 MG tablet Commonly known as: Zofran Take 1 tablet (4 mg total) by mouth every 8 (eight) hours as needed for nausea  or vomiting.   oxyCODONE 5 MG immediate release tablet Commonly known as: Roxicodone Take 1 tablet (5 mg total) by mouth every 4 (four) hours as needed for breakthrough pain.   pseudoephedrine 120 MG 12 hr tablet Commonly known as: SUDAFED Take 120 mg by mouth every 12 (twelve) hours as needed for congestion.   triamcinolone ointment 0.5 % Commonly known as: KENALOG Apply 1 application topically 2 (two) times daily as needed (rash).   Vitamin D 50 MCG (2000 UT) tablet Take 2,000 Units by mouth daily.       Diagnostic Studies: Ct Shoulder Left Wo Contrast  Result Date: 03/25/2019 CLINICAL DATA:  Preoperative examination. Patient for reverse left shoulder arthroplasty. EXAM: CT OF THE UPPER LEFT EXTREMITY WITHOUT CONTRAST TECHNIQUE: Multidetector CT imaging of the upper left extremity was performed according to the standard protocol. COMPARISON:  MRI left shoulder 08/31/2018. FINDINGS: Bones/Joint/Cartilage The patient has advanced glenohumeral osteoarthritis with joint space narrowing and osteophytosis present. Largest osteophyte is off the medial margin of the humeral head. Little to no subchondral cyst formation is present about the joint. No acute bony abnormality is seen. The humeral head is high-riding and abuts the acromion. Cystic change and sclerosis in the acromion at the site of abutment are consistent with chronic abutment. No lytic or sclerotic lesion. The acromioclavicular joint has been resected. Ligaments Suboptimally assessed by CT. Muscles and Tendons Complete supraspinatus and infraspinatus tendon tears are seen. There is mild fatty atrophy of rotator cuff musculature. Soft tissues Imaged lung parenchyma is clear.  Aortic atherosclerosis noted. IMPRESSION: Advanced glenohumeral osteoarthritis. Complete supraspinatus and infraspinatus tendon tears. Electronically Signed   By: Inge Rise M.D.   On: 03/25/2019 09:46   Dg Shoulder Left Port  Result Date:  04/04/2019 CLINICAL DATA:  Shoulder shoulder arthroplasty. EXAM: LEFT SHOULDER - 1 VIEW COMPARISON:  None. FINDINGS: Single view of the LEFT shoulder is provided. LEFT shoulder arthroplasty hardware is in place. Hardware appears appropriately positioned. Expected postsurgical changes within the overlying soft tissues. IMPRESSION: Status post LEFT shoulder arthroplasty. Hardware appears appropriately positioned. No evidence of surgical complicating feature. Electronically Signed   By: Franki Cabot M.D.   On: 04/04/2019 11:15   Dg Foot Complete Right  Result Date: 03/28/2019 Please see detailed radiograph report in office note.   Disposition: Discharge disposition: 01-Home or Self Care       Discharge Instructions    Discharge patient   Complete by: As directed    After OT eval   Discharge disposition: 01-Home or Self Care   Discharge patient date: 04/05/2019      Follow-up Information    Erin Butters, MD.   Specialty: Orthopedic Surgery Contact information: [address] [place] [id number] [phone]            Signed: Prudencio Burly III PA-C 04/05/2019, 7:51 AM

## 2019-04-14 ENCOUNTER — Encounter (HOSPITAL_COMMUNITY): Payer: Self-pay | Admitting: Orthopedic Surgery

## 2019-04-20 ENCOUNTER — Telehealth: Payer: Self-pay | Admitting: Podiatry

## 2019-04-20 NOTE — Telephone Encounter (Signed)
Dr. Jacqualyn Posey states he was going to check the progress of the bone healing with the bone growth stimulator but if pt needed she could push her appt out a week or 2. I informed pt of Dr. Leigh Aurora statement and she stated she would keep the appt 04/25/2019.

## 2019-04-20 NOTE — Telephone Encounter (Signed)
Pt wanted to make sure that she is suppose to keep her appointment on Tuesday 08/11 since she is using the bone stimulator. Patient wanted to know should she have made appointment 4 weeks after she used it. Please call patient to clarify.

## 2019-04-25 ENCOUNTER — Ambulatory Visit (INDEPENDENT_AMBULATORY_CARE_PROVIDER_SITE_OTHER): Payer: Medicare HMO | Admitting: Podiatry

## 2019-04-25 ENCOUNTER — Encounter: Payer: Self-pay | Admitting: Podiatry

## 2019-04-25 ENCOUNTER — Other Ambulatory Visit: Payer: Self-pay

## 2019-04-25 ENCOUNTER — Ambulatory Visit (INDEPENDENT_AMBULATORY_CARE_PROVIDER_SITE_OTHER): Payer: Medicare HMO

## 2019-04-25 VITALS — Temp 97.3°F

## 2019-04-25 DIAGNOSIS — M2011 Hallux valgus (acquired), right foot: Secondary | ICD-10-CM

## 2019-04-26 NOTE — Progress Notes (Signed)
Subjective: Erin Vasquez is a 63 y.o. is seen today in office s/p right foot Lapidus, Akin bunionectomy, second metatarsal osteotomy, tailor's bunion correction preformed on 10/12/2018.  She does state that the foot feels "fine" not having any significant issues.  She is concerned about the recurrence of the bunion however which we discussed.  She still using a bone stimulator.  She recently had shoulder surgery on the left side, this has been doing well.    Denies any fevers, chills, nausea, vomiting.  No calf pain, chest pain, shortness of breath.   Objective: General: No acute distress, AAOx3  DP/PT pulses palpable 2/4, CRT < 3 sec to all digits.  Protective sensation intact. Motor function intact.  RIGHT foot: Incision is well coapted without any evidence of scar is well formed. There is no significant discomfort palpation at surgical site.  Unfortunately reoccurrence of a new deformity overall exam is unchanged.  Also prominence of the first metatarsocuneiform joint plantarly. No pain with calf compression, swelling, warmth, erythema.  Assessment and Plan:  Status post right foot surgery  -Treatment options discussed including all alternatives, risks, and complications -X-rays were reviewed.  Stable is broken but not change position.  Unfortunately reoccurrence of the bunion deformity.   -Ultimately discussed revision bunion surgery.  I do think that consider this although she denies significant discomfort and she would like to do this but would be after the first of the year.  For now continue with supportive shoes and offloading.  If there is any changes let me know prior to this otherwise I will see her back in about 6 months to revisit.  Trula Slade DPM

## 2019-06-07 ENCOUNTER — Other Ambulatory Visit: Payer: Self-pay | Admitting: Orthopedic Surgery

## 2019-06-07 DIAGNOSIS — M25512 Pain in left shoulder: Secondary | ICD-10-CM

## 2019-06-14 ENCOUNTER — Other Ambulatory Visit: Payer: Medicare HMO

## 2019-06-19 ENCOUNTER — Ambulatory Visit
Admission: RE | Admit: 2019-06-19 | Discharge: 2019-06-19 | Disposition: A | Discharge status: Home / Self Care | Payer: Medicare HMO | Source: Ambulatory Visit | Attending: Orthopedic Surgery | Admitting: Orthopedic Surgery

## 2019-06-19 ENCOUNTER — Other Ambulatory Visit: Payer: Self-pay

## 2019-06-19 DIAGNOSIS — M25512 Pain in left shoulder: Secondary | ICD-10-CM

## 2019-07-06 ENCOUNTER — Telehealth: Payer: Self-pay | Admitting: Podiatry

## 2019-07-06 NOTE — Telephone Encounter (Signed)
Contacted pt to let her know that her medical records were ready. Pt also asked for an itemized billing statement to go with her medical records. Pt stated she would be in probably Monday or one day next week to pick up her records.

## 2019-10-24 ENCOUNTER — Ambulatory Visit (INDEPENDENT_AMBULATORY_CARE_PROVIDER_SITE_OTHER): Payer: Medicare HMO | Admitting: Podiatry

## 2019-10-24 ENCOUNTER — Ambulatory Visit (INDEPENDENT_AMBULATORY_CARE_PROVIDER_SITE_OTHER): Payer: Medicare HMO

## 2019-10-24 ENCOUNTER — Other Ambulatory Visit: Payer: Self-pay

## 2019-10-24 ENCOUNTER — Other Ambulatory Visit: Payer: Self-pay | Admitting: Podiatry

## 2019-10-24 DIAGNOSIS — M2041 Other hammer toe(s) (acquired), right foot: Secondary | ICD-10-CM

## 2019-10-24 DIAGNOSIS — M79671 Pain in right foot: Secondary | ICD-10-CM

## 2019-10-24 DIAGNOSIS — M2011 Hallux valgus (acquired), right foot: Secondary | ICD-10-CM

## 2019-10-30 NOTE — Progress Notes (Signed)
Subjective: 64 year old female presents the office today for follow-up evaluation of right foot pain.  She previous underwent Lapidus bunionectomy but unfortunate hardware did break and she had reoccurrence of the bunion.  Although the bunion is better than what it was prior to surgery unfortunately toe is starting to drift back and causing the second toe to move as well.  She also gets discomfort in the plantar foot on the plantar medial aspect the foot on the surgical site.  She has been doing well otherwise.  Last week she started developed some sharp pain into the forefoot area on the ball of her foot it seems.  No recent injury no swelling.  This is intermittent.  She does walk at least a mile a day with dogs.  She wears slip on shoes for this. Denies any systemic complaints such as fevers, chills, nausea, vomiting. No acute changes since last appointment, and no other complaints at this time.   Objective: AAO x3, NAD DP/PT pulses palpable bilaterally, CRT less than 3 seconds There is recurrence of the bunion deformity.  There is decrease range of motion of the first MPJ.  Medial deviation of the second digit as well.  Suggest that she is getting some discomfort MPJ area of the third, fourth but there is no significant discomfort today there is no area of pinpoint tenderness.  Flatfoot is present.  Her right hallux toenail is dystrophic, discolored and hypertrophic has yellow-brown discoloration. No open lesions or pre-ulcerative lesions.  No pain with calf compression, swelling, warmth, erythema  Assessment: 64 year old female with reoccurrence of bunion, first metatarsal cuneiform joint discomfort; metatarsalgia  Plan: -All treatment options discussed with the patient including all alternatives, risks, complications.  -X-rays obtained reviewed.  Recurrence of bunion forming the broken hardware on the first metatarsal cuneiform joint.  Medial deviation of the second digit.  There is no evidence  of acute fracture. -We have a long discussion regards to treatment options.  Discussed with conservative as well as surgical options.  Given the recurrence of bunion I discussed with her revisional Lapidus bunionectomy.  She was to consider doing this later this summer (likley August).  For now dispensed graphite insert.  Discussed wearing more supportive shoes. -Urea cream for the toenail.  We are out of this currently and we will call her once it arrives. -Patient encouraged to call the office with any questions, concerns, change in symptoms.   RTC in July for surgery consult- repeat x-rays  Trula Slade DPM

## 2019-11-03 ENCOUNTER — Telehealth: Payer: Self-pay | Admitting: *Deleted

## 2019-11-03 NOTE — Telephone Encounter (Signed)
Called and left a message for the patient to come by the [place] office and pick up the revitaderm and I would have instructions with it as well and to call the office if any concerns or questions. Lattie Haw

## 2019-11-03 NOTE — Telephone Encounter (Signed)
Patient stopped by the office today and picked up the urea cream and went over the instructions with the patient and stated to call the office if any concerns or questions. Erin Vasquez

## 2020-03-25 ENCOUNTER — Ambulatory Visit: Payer: Medicare HMO | Admitting: Podiatry

## 2020-05-21 ENCOUNTER — Telehealth: Payer: Self-pay | Admitting: Podiatry

## 2020-05-21 NOTE — Telephone Encounter (Signed)
Pt would like a handicap sticker until her sx next year. Please call patient

## 2020-05-29 NOTE — Telephone Encounter (Signed)
That is fine 

## 2020-05-29 NOTE — Telephone Encounter (Signed)
Please advise. Thanks Ricki Vanhandel 

## 2020-06-17 IMAGING — MR MR SHOULDER*L* W/O CM
4 of 5 series · 28 of 40 positions shown · non-contrast
Comparison: None.

CLINICAL DATA: Left shoulder pain status post fall.

EXAM:
MRI OF THE LEFT SHOULDER WITHOUT CONTRAST
TECHNIQUE: Multiplanar, multisequence MR imaging of the shoulder was performed.
No intravenous contrast was administered.

[Series 3: PD fat-sat · axial · 4.0mm · 0.27mm/px · z∈[-42,+43]mm · 8 of 19 slices shown (1 of 2)]
[im 1/19]
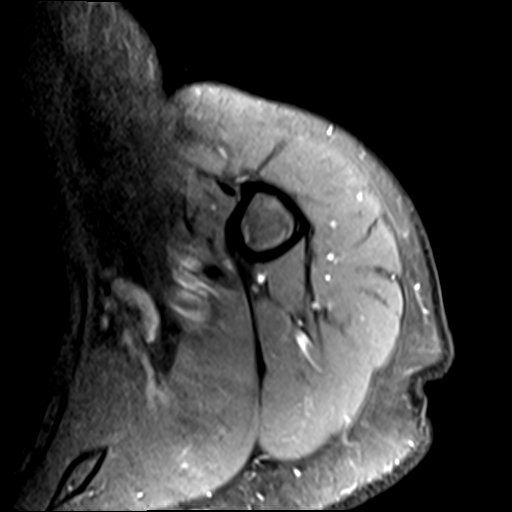
[im 3/19]
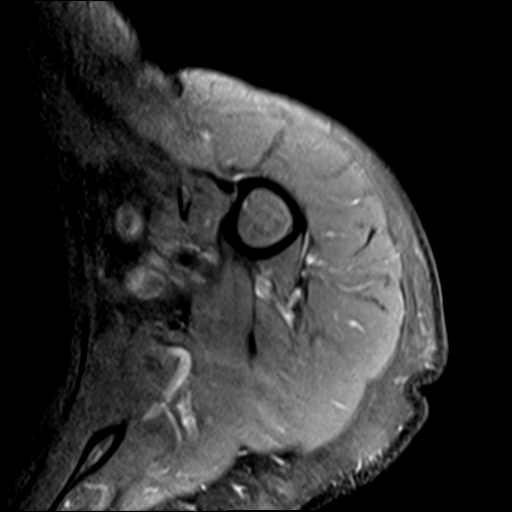
[im 7/19]
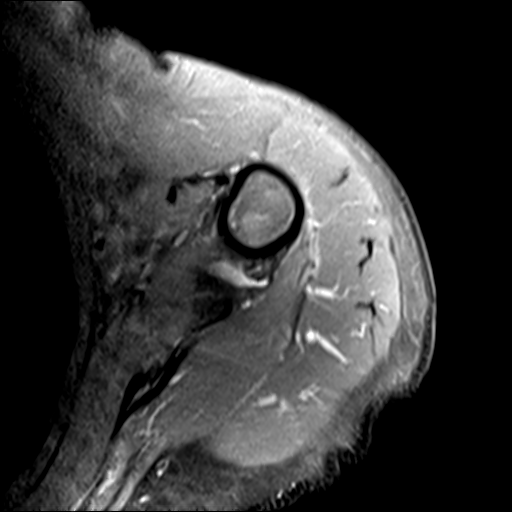
[im 9/19]
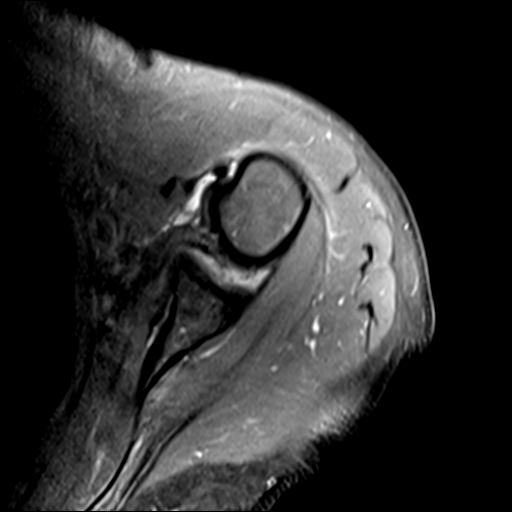
[im 11/19]
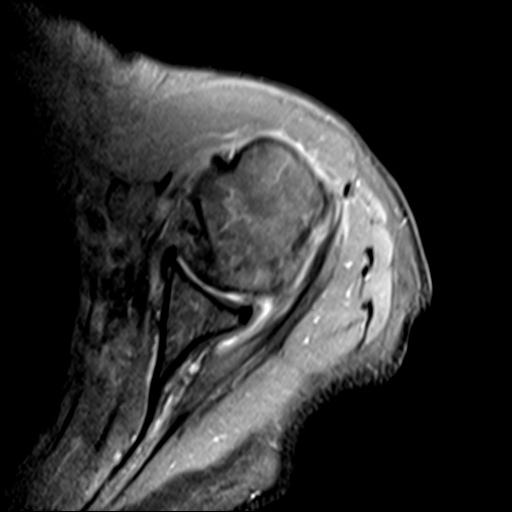
[im 13/19]
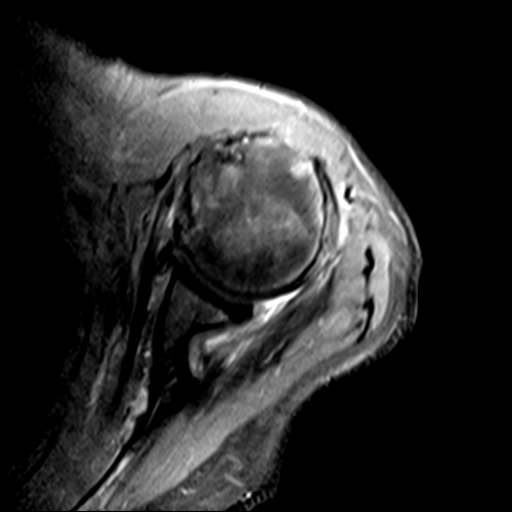
[im 17/19]
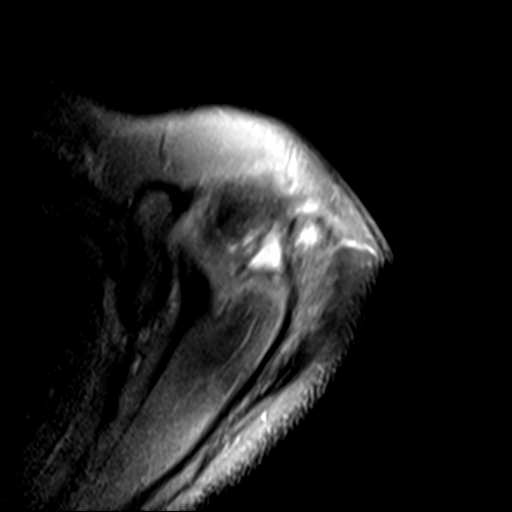
[im 19/19]
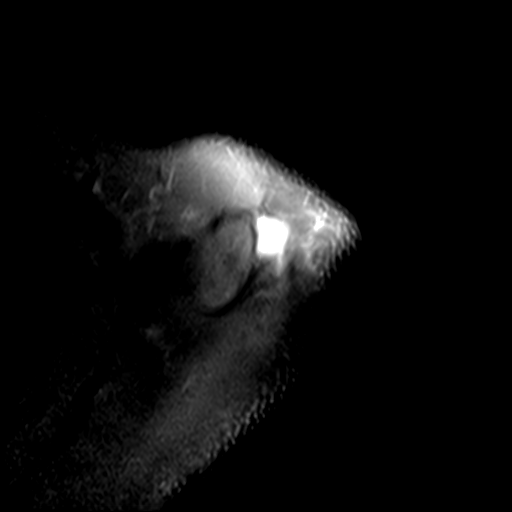

[Series 4: T2 fat-sat · sagittal · 4.0mm · 0.55mm/px · 7 of 15 slices shown (1 of 2)]
[im 1/15]
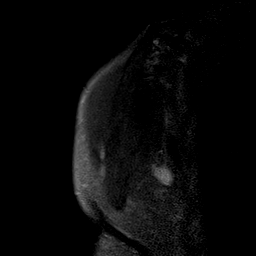
[im 3/15]
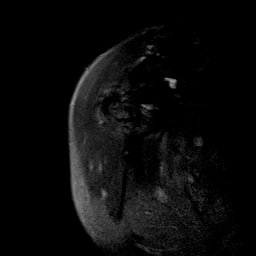
[im 5/15]
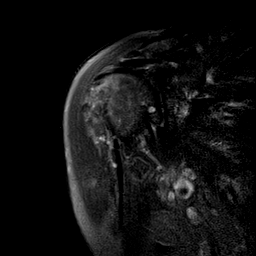
[im 8/15]
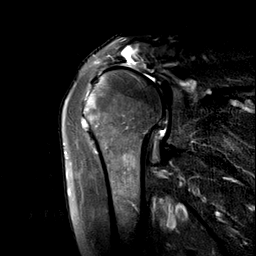
[im 10/15]
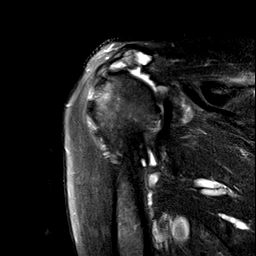
[im 12/15]
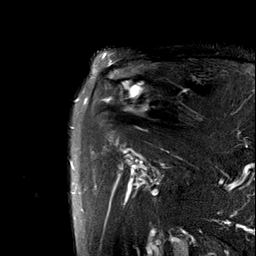
[im 15/15]
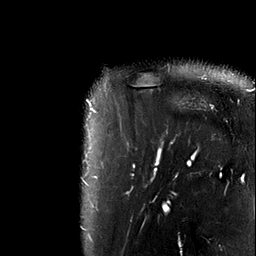

[Series 5: PD fat-sat · sagittal · 4.0mm · 0.27mm/px · 7 of 15 slices shown (2 of 2)]
[im 1/15]
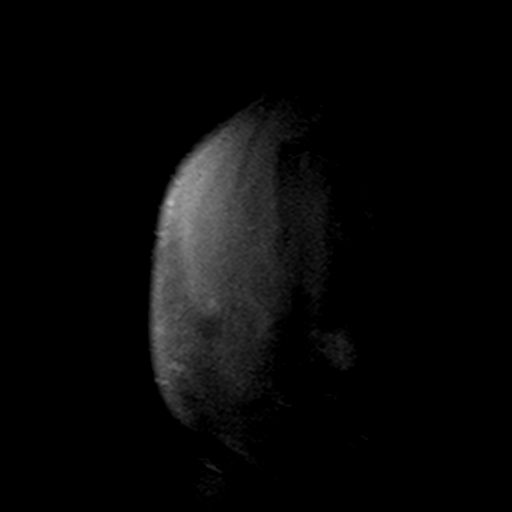
[im 3/15]
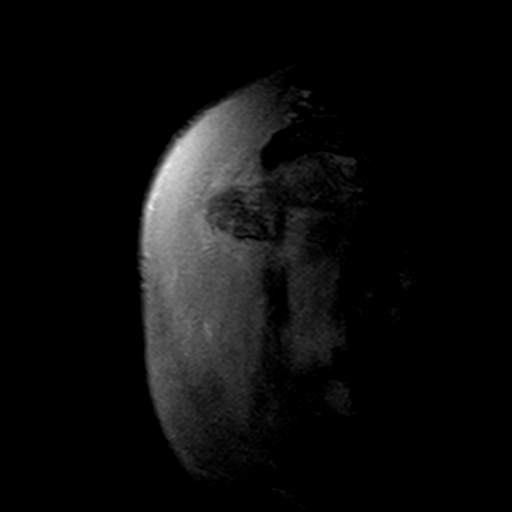
[im 5/15]
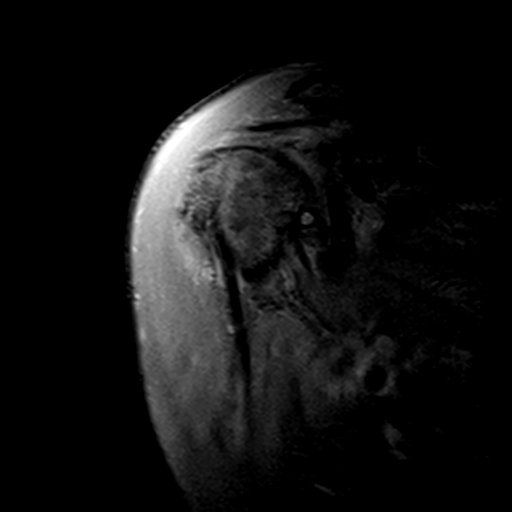
[im 8/15]
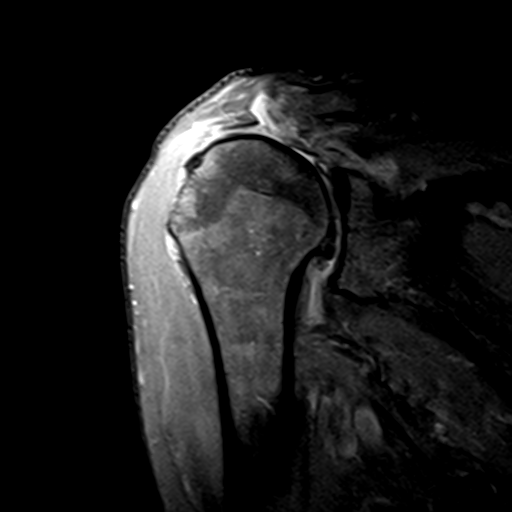
[im 10/15]
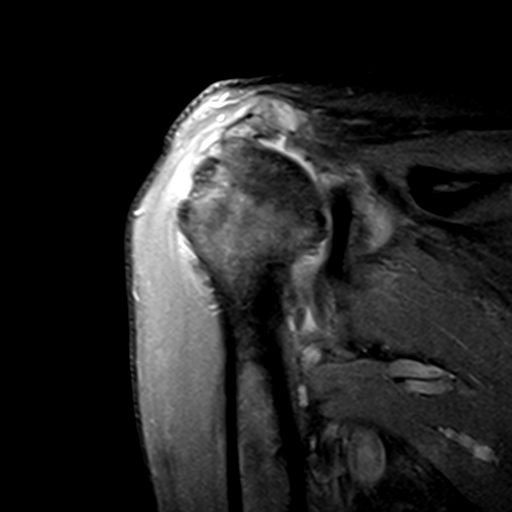
[im 12/15]
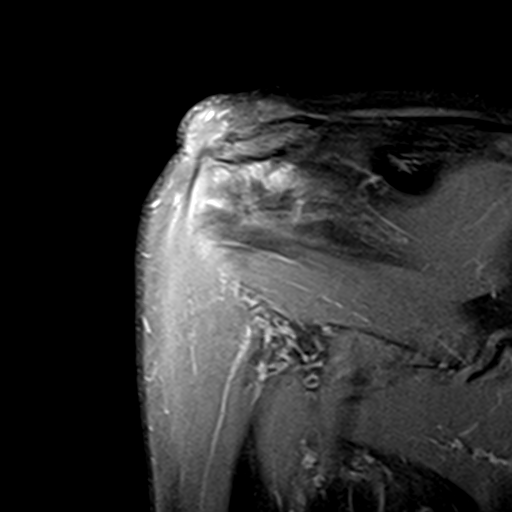
[im 15/15]
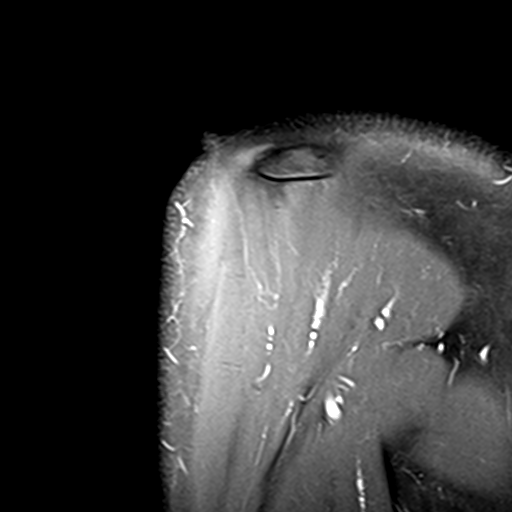

[Series 6: T2 fat-sat · coronal · 4.0mm · 0.55mm/px · 6 of 17 slices shown (2 of 2)]
[im 1/17]
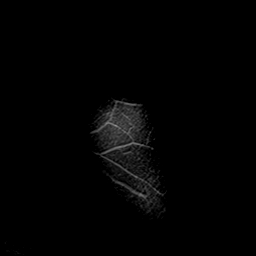
[im 3/17]
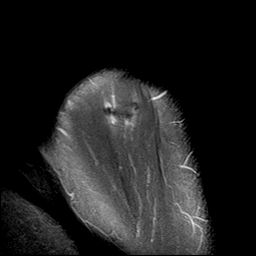
[im 5/17]
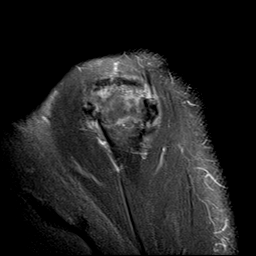
[im 7/17]
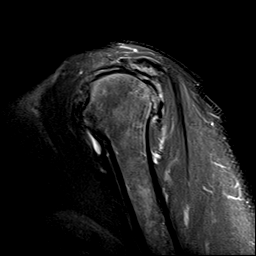
[im 10/17]
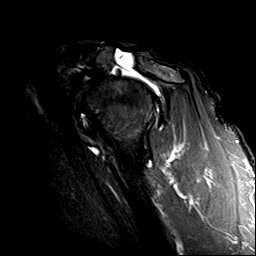
[im 14/17]
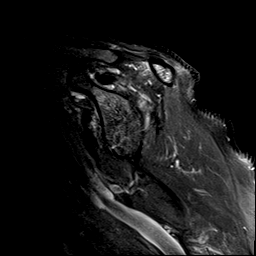

[28 of 40 positions shown; findings below may reference images not displayed]

FINDINGS: Rotator cuff: Complete tear of the supraspinatus and infraspinatus
tendon with 3 cm of retraction. Teres minor tendon is intact.
Subscapularis tendon is intact.

Muscles: Mild atrophy of the supraspinatus and infraspinatus
muscles.

Biceps long head: Mild tendinosis of the intra-articular portion of
the long head of the biceps tendon with attenuation likely
reflecting a partial tear.

Acromioclavicular Joint: Mild arthropathy of the acromioclavicular
joint. Mild AC joint widening likely chronic given the lack of
surrounding soft tissue edema. Type I acromion. Small amount of
subacromial/subdeltoid bursal fluid.

Glenohumeral Joint: Partial-thickness cartilage loss of the
glenohumeral joint with marginal osteophytes. Small joint effusion
with mild synovitis.

Labrum:  Degeneration of the superior labrum.

Bones:  No acute osseous abnormality.  No aggressive osseous lesion.

Other: No fluid collection or hematoma.
IMPRESSION: 1. Complete tear of the supraspinatus and infraspinatus tendon with
3 cm of retraction. Mild atrophy of the supraspinatus and
infraspinatus muscles.
2. Mild tendinosis of the intra-articular portion of the long head
of the biceps tendon with attenuation likely reflecting a partial
tear.
3. Moderate osteoarthritis of the glenohumeral joint.

## 2020-08-23 ENCOUNTER — Telehealth: Payer: Self-pay | Admitting: Physician Assistant

## 2020-08-23 ENCOUNTER — Other Ambulatory Visit: Payer: Self-pay | Admitting: Physician Assistant

## 2020-08-23 DIAGNOSIS — G4733 Obstructive sleep apnea (adult) (pediatric): Secondary | ICD-10-CM

## 2020-08-23 DIAGNOSIS — Z8542 Personal history of malignant neoplasm of other parts of uterus: Secondary | ICD-10-CM

## 2020-08-23 DIAGNOSIS — E669 Obesity, unspecified: Secondary | ICD-10-CM

## 2020-08-23 DIAGNOSIS — E871 Hypo-osmolality and hyponatremia: Secondary | ICD-10-CM

## 2020-08-23 DIAGNOSIS — I10 Essential (primary) hypertension: Secondary | ICD-10-CM

## 2020-08-23 DIAGNOSIS — E66811 Obesity, class 1: Secondary | ICD-10-CM

## 2020-08-23 DIAGNOSIS — I2699 Other pulmonary embolism without acute cor pulmonale: Secondary | ICD-10-CM

## 2020-08-23 DIAGNOSIS — U071 COVID-19: Secondary | ICD-10-CM

## 2020-08-23 NOTE — Progress Notes (Signed)
I connected by phone with Erin Vasquez on 08/23/2020 at 10:15 AM to discuss the potential use of a new treatment for mild to moderate COVID-19 viral infection in non-hospitalized patients.  This patient is a 64 y.o. female that meets the FDA criteria for Emergency Use Authorization of COVID monoclonal antibody sotrovimab, casirivimab/imdevimab or bamlamivimab/estevimab.  Has a (+) direct SARS-CoV-2 viral test result  Has mild or moderate COVID-19   Is NOT hospitalized due to COVID-19  Is within 10 days of symptom onset  Has at least one of the high risk factor(s) for progression to severe COVID-19 and/or hospitalization as defined in EUA.  Specific high risk criteria : Older age (>/= 65 yo), BMI > 25, Immunosuppressive Disease or Treatment, Cardiovascular disease or hypertension, Chronic Lung Disease and Other high risk medical condition per CDC:  high SVI   I have spoken and communicated the following to the patient or parent/caregiver regarding COVID monoclonal antibody treatment:  1. FDA has authorized the emergency use for the treatment of mild to moderate COVID-19 in adults and pediatric patients with positive results of direct SARS-CoV-2 viral testing who are 65 years of age and older weighing at least 40 kg, and who are at high risk for progressing to severe COVID-19 and/or hospitalization.  2. The significant known and potential risks and benefits of COVID monoclonal antibody, and the extent to which such potential risks and benefits are unknown.  3. Information on available alternative treatments and the risks and benefits of those alternatives, including clinical trials.  4. Patients treated with COVID monoclonal antibody should continue to self-isolate and use infection control measures (e.g., wear mask, isolate, social distance, avoid sharing personal items, clean and disinfect "high touch" surfaces, and frequent handwashing) according to CDC guidelines.   5. The patient or  parent/caregiver has the option to accept or refuse COVID monoclonal antibody treatment.  After reviewing this information with the patient, the patient has agreed to receive one of the available covid 19 monoclonal antibodies and will be provided an appropriate fact sheet prior to infusion.  Sx onset 12/9. Set up for infusion on 12/11 @ 11:30pm. Directions given to [facility]. Pt is aware that insurance will be charged an infusion fee. Pt is fully vaccinated. + uploaded into epic  Angelena Form 08/23/2020 10:15 AM

## 2020-08-23 NOTE — Telephone Encounter (Signed)
Called to discuss with patient about Covid symptoms and the use of sotrovimab, bamlanivimab/etesevimab or casirivimab/imdevimab, a monoclonal antibody infusion for those with mild to moderate Covid symptoms and at a high risk of hospitalization.  Pt is qualified for this infusion at the [facility] due to; Specific high risk criteria : Older age (>/= 66 yo), BMI > 25, Immunosuppressive Disease or Treatment and Other high risk medical condition per CDC:  high SVI   Message left to call back our hotline [phone]. My chart message sent if active on Mychart.   Angelena Form PA-C

## 2020-08-24 ENCOUNTER — Ambulatory Visit (HOSPITAL_COMMUNITY)
Admission: RE | Admit: 2020-08-24 | Discharge: 2020-08-24 | Disposition: A | Discharge status: Home / Self Care | Payer: Medicare Other | Source: Ambulatory Visit | Attending: Pulmonary Disease | Admitting: Pulmonary Disease

## 2020-08-24 DIAGNOSIS — I2699 Other pulmonary embolism without acute cor pulmonale: Secondary | ICD-10-CM | POA: Diagnosis present

## 2020-08-24 DIAGNOSIS — E669 Obesity, unspecified: Secondary | ICD-10-CM | POA: Diagnosis present

## 2020-08-24 DIAGNOSIS — E871 Hypo-osmolality and hyponatremia: Secondary | ICD-10-CM | POA: Insufficient documentation

## 2020-08-24 DIAGNOSIS — I10 Essential (primary) hypertension: Secondary | ICD-10-CM | POA: Insufficient documentation

## 2020-08-24 DIAGNOSIS — Z8542 Personal history of malignant neoplasm of other parts of uterus: Secondary | ICD-10-CM | POA: Diagnosis present

## 2020-08-24 DIAGNOSIS — Z23 Encounter for immunization: Secondary | ICD-10-CM | POA: Insufficient documentation

## 2020-08-24 DIAGNOSIS — U071 COVID-19: Secondary | ICD-10-CM | POA: Diagnosis present

## 2020-08-24 DIAGNOSIS — G4733 Obstructive sleep apnea (adult) (pediatric): Secondary | ICD-10-CM | POA: Insufficient documentation

## 2020-08-24 MED ORDER — FAMOTIDINE IN NACL 20-0.9 MG/50ML-% IV SOLN: 20.0000 mg | Freq: Once | INTRAVENOUS | Status: DC | PRN

## 2020-08-24 MED ORDER — METHYLPREDNISOLONE SODIUM SUCC 125 MG IJ SOLR: 125.0000 mg | Freq: Once | INTRAMUSCULAR | Status: DC | PRN

## 2020-08-24 MED ORDER — DIPHENHYDRAMINE HCL 50 MG/ML IJ SOLN: 50.0000 mg | Freq: Once | INTRAMUSCULAR | Status: DC | PRN

## 2020-08-24 MED ORDER — EPINEPHRINE 0.3 MG/0.3ML IJ SOAJ: 0.3000 mg | Freq: Once | INTRAMUSCULAR | Status: DC | PRN

## 2020-08-24 MED ORDER — ALBUTEROL SULFATE HFA 108 (90 BASE) MCG/ACT IN AERS: 2.0000 | INHALATION_SPRAY | Freq: Once | RESPIRATORY_TRACT | Status: DC | PRN

## 2020-08-24 MED ORDER — SODIUM CHLORIDE 0.9 % IV SOLN: Freq: Once | INTRAVENOUS | Status: DC

## 2020-08-24 MED ORDER — SODIUM CHLORIDE 0.9 % IV SOLN
1200.0000 mg | Freq: Once | INTRAVENOUS | Status: AC
  Administered 2020-08-24: 12:00:00 1200 mg via INTRAVENOUS
  Filled 2020-08-24: qty 10

## 2020-08-24 MED ORDER — SODIUM CHLORIDE 0.9 % IV SOLN: INTRAVENOUS | Status: DC | PRN

## 2020-08-24 NOTE — Progress Notes (Signed)
Patient reviewed Fact Sheet for Patients, Parents, and Caregivers for Emergency Use Authorization (EUA) of Casirivimab for the Treatment of Coronavirus. Patient also reviewed and is agreeable to the estimated cost of treatment. Patient is agreeable to proceed.

## 2020-08-24 NOTE — Discharge Instructions (Signed)
10 Things You Can Do to Manage Your COVID-19 Symptoms at Home If you have possible or confirmed COVID-19: 1. Stay home from work and school. And stay away from other public places. If you must go out, avoid using any kind of public transportation, ridesharing, or taxis. 2. Monitor your symptoms carefully. If your symptoms get worse, call your healthcare provider immediately. 3. Get rest and stay hydrated. 4. If you have a medical appointment, call the healthcare provider ahead of time and tell them that you have or may have COVID-19. 5. For medical emergencies, call 911 and notify the dispatch personnel that you have or may have COVID-19. 6. Cover your cough and sneezes with a tissue or use the inside of your elbow. 7. Wash your hands often with soap and water for at least 20 seconds or clean your hands with an alcohol-based hand sanitizer that contains at least 60% alcohol. 8. As much as possible, stay in a specific room and away from other people in your home. Also, you should use a separate bathroom, if available. If you need to be around other people in or outside of the home, wear a mask. 9. Avoid sharing personal items with other people in your household, like dishes, towels, and bedding. 10. Clean all surfaces that are touched often, like counters, tabletops, and doorknobs. Use household cleaning sprays or wipes according to the label instructions. cdc.gov/coronavirus 03/15/2019 This information is not intended to replace advice given to you by your health care provider. Make sure you discuss any questions you have with your health care provider. Document Revised: 08/17/2019 Document Reviewed: 08/17/2019 Elsevier Patient Education  2020 Elsevier Inc. What types of side effects do monoclonal antibody drugs cause?  Common side effects  In general, the more common side effects caused by monoclonal antibody drugs include: . Allergic reactions, such as hives or itching . Flu-like signs and  symptoms, including chills, fatigue, fever, and muscle aches and pains . Nausea, vomiting . Diarrhea . Skin rashes . Low blood pressure   The CDC is recommending patients who receive monoclonal antibody treatments wait at least 90 days before being vaccinated.  Currently, there are no data on the safety and efficacy of mRNA COVID-19 vaccines in persons who received monoclonal antibodies or convalescent plasma as part of COVID-19 treatment. Based on the estimated half-life of such therapies as well as evidence suggesting that reinfection is uncommon in the 90 days after initial infection, vaccination should be deferred for at least 90 days, as a precautionary measure until additional information becomes available, to avoid interference of the antibody treatment with vaccine-induced immune responses. If you have any questions or concerns after the infusion please call the Advanced Practice Provider on call at [phone]. This number is ONLY intended for your use regarding questions or concerns about the infusion post-treatment side-effects.  Please do not provide this number to others for use. For return to work notes please contact your primary care provider.   If someone you know is interested in receiving treatment please have them call the COVID hotline at [phone].   

## 2020-08-24 NOTE — Progress Notes (Signed)
  Diagnosis: COVID-19  Physician:  Patrick Wright  Procedure: [facility] Med: casirivimab\imdevimab infusion - Provided patient with casirivimab\imdevimab fact sheet for patients, parents and caregivers prior to infusion.  Complications: No immediate complications noted.  Discharge: Discharged home   Braedyn Kauk L 08/24/2020  

## 2020-08-29 ENCOUNTER — Other Ambulatory Visit: Payer: Self-pay

## 2020-08-29 ENCOUNTER — Ambulatory Visit (INDEPENDENT_AMBULATORY_CARE_PROVIDER_SITE_OTHER): Payer: Medicare HMO | Admitting: Podiatry

## 2020-08-29 DIAGNOSIS — M79675 Pain in left toe(s): Secondary | ICD-10-CM

## 2020-08-29 DIAGNOSIS — S90212A Contusion of left great toe with damage to nail, initial encounter: Secondary | ICD-10-CM

## 2020-08-29 NOTE — Patient Instructions (Signed)

## 2020-08-31 ENCOUNTER — Encounter: Payer: Self-pay | Admitting: Podiatry

## 2020-08-31 NOTE — Progress Notes (Signed)
  Subjective:  Patient ID: JENNINE PEDDY, female    DOB: [birth year],  MRN: [id number]  Chief Complaint  Patient presents with  . Nail Problem    left foot great toenail is falling off    64 y.o. female presents with the above complaint. History confirmed with patient.  She stubbed the toe about a month ago and it lifted the nail off partially.  Is getting more and more loose.  She is on Xarelto for DVTs.  Objective:  Physical Exam: warm, good capillary refill, no trophic changes or ulcerative lesions, normal DP and PT pulses and normal sensory exam. Left Foot: Hallux nail loose with detachment from nail bed with exception of proximal lateral quadrant.  No pain to the distal phalanx or evidence of fracture dislocation  Assessment:   1. Contusion of left great toe with damage to nail, initial encounter   2. Pain in toe of left foot      Plan:  Patient was evaluated and treated and all questions answered.  Recommend we proceed with temporary nail plate avulsion.  Following sterile prep with Betadine and digital block with 1.5 cc each of 2% Xylocaine plain and 0.5% Marcaine plain, the nail plate of the left hallux nail was avulsed.  A sterile compression dressing was applied and post procedure care instructions were given.  Return in about 2 weeks (around 09/12/2020) for left nail check.

## 2020-09-12 ENCOUNTER — Ambulatory Visit: Payer: Medicare HMO | Admitting: Podiatry

## 2020-12-09 ENCOUNTER — Telehealth: Payer: Self-pay | Admitting: Podiatry

## 2020-12-09 NOTE — Telephone Encounter (Signed)
OK to complete this.

## 2020-12-09 NOTE — Telephone Encounter (Signed)
Patient is requesting a renewal on her handicap sticker. I informed patient she would need to get the official paperwork for renewal from the [facility], and bring them into the office to be signed off on. Please advise if there is any paperwork from our office that she must fill out to renew her sticker.

## 2020-12-10 NOTE — Telephone Encounter (Signed)
Handicap placement card paperwork completed. Will call patient to pick up

## 2021-07-21 DIAGNOSIS — R399 Unspecified symptoms and signs involving the genitourinary system: Secondary | ICD-10-CM | POA: Diagnosis not present

## 2021-08-18 DIAGNOSIS — H109 Unspecified conjunctivitis: Secondary | ICD-10-CM | POA: Diagnosis not present

## 2021-10-13 ENCOUNTER — Encounter: Payer: Self-pay | Admitting: Podiatry

## 2021-10-13 ENCOUNTER — Ambulatory Visit: Payer: Medicare PPO | Admitting: Podiatry

## 2021-10-13 ENCOUNTER — Other Ambulatory Visit: Payer: Self-pay

## 2021-10-13 DIAGNOSIS — Q828 Other specified congenital malformations of skin: Secondary | ICD-10-CM

## 2021-10-13 NOTE — Progress Notes (Signed)
°  Subjective:  Patient ID: Erin Vasquez, female    DOB: 05/12/1956,   MRN: [id number]  Chief Complaint  Patient presents with   Callouses    Possible callus on left forefoot area x 3months     66 y.o. female presents for concern of callus on her left foot that has been present for over 6 months. Relates pain to the area and has tried trimming it and cutting out the core but returns . Denies any other pedal complaints. Denies n/v/f/c.   Past Medical History:  Diagnosis Date   Anemia    during pregnancy   Anxiety    Arthritis    "all over" (07/07/2017)   Bilateral pulmonary embolism ([place]) 10/29/2015   "from my left ankle"   Childhood asthma    as child, nothing as adult   Chronic back pain    "all over" (07/07/2017)   DVT (deep venous thrombosis) ([place]) 10/29/2015   "from my left ankle"   GERD (gastroesophageal reflux disease)    tx. Tums   H/O hiatal hernia    "repaired w/gastric bypass OR" (07/07/2017)   History of esophageal dilatation    Hypertension    Hypothyroidism    Lupus anticoagulant disorder ([place])    Dr. Alen Blew    Neuromuscular disorder [facility])    L thigh - Numbness, nerve damage     Sacroiliac pain    "left thigh has been numb since 01/2016 after appendectomy" (07/07/2017)   Sleep apnea    07/07/2017 "bariatric dr says I don't need CPAP anymore" (07/07/2017)   Thyroid disease    Uterine cancer ([place]) 01/2014   Varicose veins    Ventral hernia    Vitamin D deficiency     Objective:  Physical Exam: Vascular: DP/PT pulses 2/4 bilateral. CFT <3 seconds. Normal hair growth on digits. No edema.  Skin. No lacerations or abrasions bilateral feet. Hyperkeratotic cored lesion noted to plantar fifth metatarsal on left.  Musculoskeletal: MMT 5/5 bilateral lower extremities in DF, PF, Inversion and Eversion. Deceased ROM in DF of ankle joint.  Neurological: Sensation intact to light touch.   Assessment:   1. Porokeratosis      Plan:  Patient was evaluated and  treated and all questions answered. -Discussed corns and calluses with patient and treatment options.  -Hyperkeratotic tissue was debrided with chisel without incident.  -Applied salycylic acid treatment to area with dressing. Advised to remove bandaging tomorrow.  -Encouraged daily moisturizing -Discussed use of pumice stone -Advised good supportive shoes and inserts -Patient to return to office as needed or sooner if condition worsens.   Lorenda Peck, DPM

## 2021-11-03 DIAGNOSIS — D6861 Antiphospholipid syndrome: Secondary | ICD-10-CM | POA: Diagnosis not present

## 2021-11-03 DIAGNOSIS — Z86711 Personal history of pulmonary embolism: Secondary | ICD-10-CM | POA: Diagnosis not present

## 2021-11-03 DIAGNOSIS — Z7901 Long term (current) use of anticoagulants: Secondary | ICD-10-CM | POA: Diagnosis not present

## 2021-11-03 DIAGNOSIS — R760 Raised antibody titer: Secondary | ICD-10-CM | POA: Diagnosis not present

## 2021-11-03 DIAGNOSIS — I82409 Acute embolism and thrombosis of unspecified deep veins of unspecified lower extremity: Secondary | ICD-10-CM | POA: Diagnosis not present

## 2021-11-27 DIAGNOSIS — K2100 Gastro-esophageal reflux disease with esophagitis, without bleeding: Secondary | ICD-10-CM | POA: Diagnosis not present

## 2021-11-27 DIAGNOSIS — Z9884 Bariatric surgery status: Secondary | ICD-10-CM | POA: Diagnosis not present

## 2021-11-27 DIAGNOSIS — Z86718 Personal history of other venous thrombosis and embolism: Secondary | ICD-10-CM | POA: Diagnosis not present

## 2021-11-27 DIAGNOSIS — C541 Malignant neoplasm of endometrium: Secondary | ICD-10-CM | POA: Diagnosis not present

## 2021-11-27 DIAGNOSIS — E559 Vitamin D deficiency, unspecified: Secondary | ICD-10-CM | POA: Diagnosis not present

## 2021-11-27 DIAGNOSIS — D6859 Other primary thrombophilia: Secondary | ICD-10-CM | POA: Diagnosis not present

## 2021-11-27 DIAGNOSIS — K912 Postsurgical malabsorption, not elsewhere classified: Secondary | ICD-10-CM | POA: Diagnosis not present

## 2021-11-27 DIAGNOSIS — I10 Essential (primary) hypertension: Secondary | ICD-10-CM | POA: Diagnosis not present

## 2021-11-27 DIAGNOSIS — Z86711 Personal history of pulmonary embolism: Secondary | ICD-10-CM | POA: Diagnosis not present

## 2021-11-27 DIAGNOSIS — Z903 Acquired absence of stomach [part of]: Secondary | ICD-10-CM | POA: Diagnosis not present

## 2021-11-27 DIAGNOSIS — Z8719 Personal history of other diseases of the digestive system: Secondary | ICD-10-CM | POA: Diagnosis not present

## 2021-12-02 DIAGNOSIS — E559 Vitamin D deficiency, unspecified: Secondary | ICD-10-CM | POA: Diagnosis not present

## 2021-12-02 DIAGNOSIS — M8589 Other specified disorders of bone density and structure, multiple sites: Secondary | ICD-10-CM | POA: Diagnosis not present

## 2021-12-02 DIAGNOSIS — M85851 Other specified disorders of bone density and structure, right thigh: Secondary | ICD-10-CM | POA: Diagnosis not present

## 2021-12-29 DIAGNOSIS — E559 Vitamin D deficiency, unspecified: Secondary | ICD-10-CM | POA: Diagnosis not present

## 2021-12-29 DIAGNOSIS — I82409 Acute embolism and thrombosis of unspecified deep veins of unspecified lower extremity: Secondary | ICD-10-CM | POA: Diagnosis not present

## 2021-12-29 DIAGNOSIS — Z23 Encounter for immunization: Secondary | ICD-10-CM | POA: Diagnosis not present

## 2021-12-29 DIAGNOSIS — D6861 Antiphospholipid syndrome: Secondary | ICD-10-CM | POA: Diagnosis not present

## 2021-12-29 DIAGNOSIS — I10 Essential (primary) hypertension: Secondary | ICD-10-CM | POA: Diagnosis not present

## 2021-12-29 DIAGNOSIS — E039 Hypothyroidism, unspecified: Secondary | ICD-10-CM | POA: Diagnosis not present

## 2021-12-29 DIAGNOSIS — Z86711 Personal history of pulmonary embolism: Secondary | ICD-10-CM | POA: Diagnosis not present

## 2021-12-29 DIAGNOSIS — M8580 Other specified disorders of bone density and structure, unspecified site: Secondary | ICD-10-CM | POA: Diagnosis not present

## 2021-12-29 DIAGNOSIS — Z7901 Long term (current) use of anticoagulants: Secondary | ICD-10-CM | POA: Diagnosis not present

## 2022-01-06 DIAGNOSIS — Z1231 Encounter for screening mammogram for malignant neoplasm of breast: Secondary | ICD-10-CM | POA: Diagnosis not present

## 2022-01-09 DIAGNOSIS — Z1321 Encounter for screening for nutritional disorder: Secondary | ICD-10-CM | POA: Diagnosis not present

## 2022-01-09 DIAGNOSIS — Z9884 Bariatric surgery status: Secondary | ICD-10-CM | POA: Diagnosis not present

## 2022-01-09 DIAGNOSIS — I10 Essential (primary) hypertension: Secondary | ICD-10-CM | POA: Diagnosis not present

## 2022-01-09 DIAGNOSIS — E039 Hypothyroidism, unspecified: Secondary | ICD-10-CM | POA: Diagnosis not present

## 2022-01-09 DIAGNOSIS — E559 Vitamin D deficiency, unspecified: Secondary | ICD-10-CM | POA: Diagnosis not present

## 2022-01-09 DIAGNOSIS — K2100 Gastro-esophageal reflux disease with esophagitis, without bleeding: Secondary | ICD-10-CM | POA: Diagnosis not present

## 2022-01-09 DIAGNOSIS — Z903 Acquired absence of stomach [part of]: Secondary | ICD-10-CM | POA: Diagnosis not present

## 2022-01-09 DIAGNOSIS — Z79899 Other long term (current) drug therapy: Secondary | ICD-10-CM | POA: Diagnosis not present

## 2022-01-09 DIAGNOSIS — M85851 Other specified disorders of bone density and structure, right thigh: Secondary | ICD-10-CM | POA: Diagnosis not present

## 2022-01-09 DIAGNOSIS — K912 Postsurgical malabsorption, not elsewhere classified: Secondary | ICD-10-CM | POA: Diagnosis not present

## 2022-01-09 DIAGNOSIS — Z780 Asymptomatic menopausal state: Secondary | ICD-10-CM | POA: Diagnosis not present

## 2022-01-09 DIAGNOSIS — M85852 Other specified disorders of bone density and structure, left thigh: Secondary | ICD-10-CM | POA: Diagnosis not present

## 2022-01-19 DIAGNOSIS — M25572 Pain in left ankle and joints of left foot: Secondary | ICD-10-CM | POA: Diagnosis not present

## 2022-01-19 DIAGNOSIS — M25571 Pain in right ankle and joints of right foot: Secondary | ICD-10-CM | POA: Diagnosis not present

## 2022-01-19 DIAGNOSIS — M7061 Trochanteric bursitis, right hip: Secondary | ICD-10-CM | POA: Diagnosis not present

## 2022-01-19 DIAGNOSIS — M79645 Pain in left finger(s): Secondary | ICD-10-CM | POA: Diagnosis not present

## 2022-01-19 DIAGNOSIS — M79644 Pain in right finger(s): Secondary | ICD-10-CM | POA: Diagnosis not present

## 2022-01-26 DIAGNOSIS — M180 Bilateral primary osteoarthritis of first carpometacarpal joints: Secondary | ICD-10-CM | POA: Diagnosis not present

## 2022-03-02 DIAGNOSIS — R350 Frequency of micturition: Secondary | ICD-10-CM | POA: Diagnosis not present

## 2022-03-02 DIAGNOSIS — I10 Essential (primary) hypertension: Secondary | ICD-10-CM | POA: Diagnosis not present

## 2022-03-10 DIAGNOSIS — Z903 Acquired absence of stomach [part of]: Secondary | ICD-10-CM | POA: Diagnosis not present

## 2022-03-10 DIAGNOSIS — K912 Postsurgical malabsorption, not elsewhere classified: Secondary | ICD-10-CM | POA: Diagnosis not present

## 2022-03-10 DIAGNOSIS — E349 Endocrine disorder, unspecified: Secondary | ICD-10-CM | POA: Diagnosis not present

## 2022-03-10 DIAGNOSIS — M85852 Other specified disorders of bone density and structure, left thigh: Secondary | ICD-10-CM | POA: Diagnosis not present

## 2022-03-10 DIAGNOSIS — M85851 Other specified disorders of bone density and structure, right thigh: Secondary | ICD-10-CM | POA: Diagnosis not present

## 2022-03-10 DIAGNOSIS — E559 Vitamin D deficiency, unspecified: Secondary | ICD-10-CM | POA: Diagnosis not present

## 2022-03-19 DIAGNOSIS — M810 Age-related osteoporosis without current pathological fracture: Secondary | ICD-10-CM | POA: Diagnosis not present

## 2022-03-25 DIAGNOSIS — I10 Essential (primary) hypertension: Secondary | ICD-10-CM | POA: Diagnosis not present

## 2022-03-25 DIAGNOSIS — M2540 Effusion, unspecified joint: Secondary | ICD-10-CM | POA: Diagnosis not present

## 2022-03-25 DIAGNOSIS — M2550 Pain in unspecified joint: Secondary | ICD-10-CM | POA: Diagnosis not present

## 2022-03-29 DIAGNOSIS — Z86718 Personal history of other venous thrombosis and embolism: Secondary | ICD-10-CM | POA: Diagnosis not present

## 2022-03-29 DIAGNOSIS — Z91048 Other nonmedicinal substance allergy status: Secondary | ICD-10-CM | POA: Diagnosis not present

## 2022-03-29 DIAGNOSIS — Z87891 Personal history of nicotine dependence: Secondary | ICD-10-CM | POA: Diagnosis not present

## 2022-03-29 DIAGNOSIS — Z91038 Other insect allergy status: Secondary | ICD-10-CM | POA: Diagnosis not present

## 2022-03-29 DIAGNOSIS — J45909 Unspecified asthma, uncomplicated: Secondary | ICD-10-CM | POA: Diagnosis not present

## 2022-03-29 DIAGNOSIS — Z8542 Personal history of malignant neoplasm of other parts of uterus: Secondary | ICD-10-CM | POA: Diagnosis not present

## 2022-03-29 DIAGNOSIS — M79662 Pain in left lower leg: Secondary | ICD-10-CM | POA: Diagnosis not present

## 2022-03-29 DIAGNOSIS — G4733 Obstructive sleep apnea (adult) (pediatric): Secondary | ICD-10-CM | POA: Diagnosis not present

## 2022-03-29 DIAGNOSIS — Z91030 Bee allergy status: Secondary | ICD-10-CM | POA: Diagnosis not present

## 2022-03-29 DIAGNOSIS — M7989 Other specified soft tissue disorders: Secondary | ICD-10-CM | POA: Diagnosis not present

## 2022-03-29 DIAGNOSIS — Z86711 Personal history of pulmonary embolism: Secondary | ICD-10-CM | POA: Diagnosis not present

## 2022-03-29 DIAGNOSIS — Z91018 Allergy to other foods: Secondary | ICD-10-CM | POA: Diagnosis not present

## 2022-03-29 DIAGNOSIS — R609 Edema, unspecified: Secondary | ICD-10-CM | POA: Diagnosis not present

## 2022-03-29 DIAGNOSIS — Z7901 Long term (current) use of anticoagulants: Secondary | ICD-10-CM | POA: Diagnosis not present

## 2022-03-29 DIAGNOSIS — Z91040 Latex allergy status: Secondary | ICD-10-CM | POA: Diagnosis not present

## 2022-03-29 DIAGNOSIS — Z885 Allergy status to narcotic agent status: Secondary | ICD-10-CM | POA: Diagnosis not present

## 2022-03-29 DIAGNOSIS — Z880 Allergy status to penicillin: Secondary | ICD-10-CM | POA: Diagnosis not present

## 2022-03-29 DIAGNOSIS — I10 Essential (primary) hypertension: Secondary | ICD-10-CM | POA: Diagnosis not present

## 2022-03-29 DIAGNOSIS — R600 Localized edema: Secondary | ICD-10-CM | POA: Diagnosis not present

## 2022-04-02 DIAGNOSIS — Z86718 Personal history of other venous thrombosis and embolism: Secondary | ICD-10-CM | POA: Diagnosis not present

## 2022-04-02 DIAGNOSIS — M7989 Other specified soft tissue disorders: Secondary | ICD-10-CM | POA: Diagnosis not present

## 2022-04-02 DIAGNOSIS — I10 Essential (primary) hypertension: Secondary | ICD-10-CM | POA: Diagnosis not present

## 2022-04-03 DIAGNOSIS — I82432 Acute embolism and thrombosis of left popliteal vein: Secondary | ICD-10-CM | POA: Diagnosis not present

## 2022-04-03 DIAGNOSIS — I82412 Acute embolism and thrombosis of left femoral vein: Secondary | ICD-10-CM | POA: Diagnosis not present

## 2022-04-15 DIAGNOSIS — I10 Essential (primary) hypertension: Secondary | ICD-10-CM | POA: Diagnosis not present

## 2022-04-15 DIAGNOSIS — L309 Dermatitis, unspecified: Secondary | ICD-10-CM | POA: Diagnosis not present

## 2022-04-17 DIAGNOSIS — Z86711 Personal history of pulmonary embolism: Secondary | ICD-10-CM | POA: Diagnosis not present

## 2022-04-17 DIAGNOSIS — I82409 Acute embolism and thrombosis of unspecified deep veins of unspecified lower extremity: Secondary | ICD-10-CM | POA: Diagnosis not present

## 2022-04-17 DIAGNOSIS — R760 Raised antibody titer: Secondary | ICD-10-CM | POA: Diagnosis not present

## 2022-04-17 DIAGNOSIS — Z87891 Personal history of nicotine dependence: Secondary | ICD-10-CM | POA: Diagnosis not present

## 2022-04-17 DIAGNOSIS — Z7901 Long term (current) use of anticoagulants: Secondary | ICD-10-CM | POA: Diagnosis not present

## 2022-04-17 DIAGNOSIS — I82412 Acute embolism and thrombosis of left femoral vein: Secondary | ICD-10-CM | POA: Diagnosis not present

## 2022-04-27 DIAGNOSIS — M25551 Pain in right hip: Secondary | ICD-10-CM | POA: Diagnosis not present

## 2022-04-27 DIAGNOSIS — M25552 Pain in left hip: Secondary | ICD-10-CM | POA: Diagnosis not present

## 2022-05-14 DIAGNOSIS — C541 Malignant neoplasm of endometrium: Secondary | ICD-10-CM | POA: Diagnosis not present

## 2022-05-14 DIAGNOSIS — I10 Essential (primary) hypertension: Secondary | ICD-10-CM | POA: Diagnosis not present

## 2022-05-15 DIAGNOSIS — Z23 Encounter for immunization: Secondary | ICD-10-CM | POA: Diagnosis not present

## 2022-05-15 DIAGNOSIS — I10 Essential (primary) hypertension: Secondary | ICD-10-CM | POA: Diagnosis not present

## 2022-05-15 DIAGNOSIS — R1909 Other intra-abdominal and pelvic swelling, mass and lump: Secondary | ICD-10-CM | POA: Diagnosis not present

## 2022-05-15 DIAGNOSIS — N3941 Urge incontinence: Secondary | ICD-10-CM | POA: Diagnosis not present

## 2022-06-29 DIAGNOSIS — Z0000 Encounter for general adult medical examination without abnormal findings: Secondary | ICD-10-CM | POA: Diagnosis not present

## 2022-06-29 DIAGNOSIS — Z8542 Personal history of malignant neoplasm of other parts of uterus: Secondary | ICD-10-CM | POA: Diagnosis not present

## 2022-06-29 DIAGNOSIS — R591 Generalized enlarged lymph nodes: Secondary | ICD-10-CM | POA: Diagnosis not present

## 2022-06-29 DIAGNOSIS — N3941 Urge incontinence: Secondary | ICD-10-CM | POA: Diagnosis not present

## 2022-06-29 DIAGNOSIS — K2100 Gastro-esophageal reflux disease with esophagitis, without bleeding: Secondary | ICD-10-CM | POA: Diagnosis not present

## 2022-06-29 DIAGNOSIS — R590 Localized enlarged lymph nodes: Secondary | ICD-10-CM | POA: Diagnosis not present

## 2022-06-29 DIAGNOSIS — I10 Essential (primary) hypertension: Secondary | ICD-10-CM | POA: Diagnosis not present

## 2022-07-06 DIAGNOSIS — Z9884 Bariatric surgery status: Secondary | ICD-10-CM | POA: Diagnosis not present

## 2022-07-06 DIAGNOSIS — Z90710 Acquired absence of both cervix and uterus: Secondary | ICD-10-CM | POA: Diagnosis not present

## 2022-07-06 DIAGNOSIS — R590 Localized enlarged lymph nodes: Secondary | ICD-10-CM | POA: Diagnosis not present

## 2022-07-21 DIAGNOSIS — H5203 Hypermetropia, bilateral: Secondary | ICD-10-CM | POA: Diagnosis not present

## 2022-07-21 DIAGNOSIS — H524 Presbyopia: Secondary | ICD-10-CM | POA: Diagnosis not present

## 2022-07-21 DIAGNOSIS — H52209 Unspecified astigmatism, unspecified eye: Secondary | ICD-10-CM | POA: Diagnosis not present

## 2022-09-25 DIAGNOSIS — Z20822 Contact with and (suspected) exposure to covid-19: Secondary | ICD-10-CM | POA: Diagnosis not present

## 2022-09-25 DIAGNOSIS — U071 COVID-19: Secondary | ICD-10-CM | POA: Diagnosis not present

## 2022-10-19 DIAGNOSIS — Z7901 Long term (current) use of anticoagulants: Secondary | ICD-10-CM | POA: Diagnosis not present

## 2022-10-19 DIAGNOSIS — Z1330 Encounter for screening examination for mental health and behavioral disorders, unspecified: Secondary | ICD-10-CM | POA: Diagnosis not present

## 2022-10-19 DIAGNOSIS — R760 Raised antibody titer: Secondary | ICD-10-CM | POA: Diagnosis not present

## 2022-10-19 DIAGNOSIS — D6861 Antiphospholipid syndrome: Secondary | ICD-10-CM | POA: Diagnosis not present

## 2022-10-19 DIAGNOSIS — I82409 Acute embolism and thrombosis of unspecified deep veins of unspecified lower extremity: Secondary | ICD-10-CM | POA: Diagnosis not present

## 2022-10-19 DIAGNOSIS — Z86711 Personal history of pulmonary embolism: Secondary | ICD-10-CM | POA: Diagnosis not present

## 2022-11-23 DIAGNOSIS — K912 Postsurgical malabsorption, not elsewhere classified: Secondary | ICD-10-CM | POA: Diagnosis not present

## 2022-11-23 DIAGNOSIS — Z9884 Bariatric surgery status: Secondary | ICD-10-CM | POA: Diagnosis not present

## 2022-11-23 DIAGNOSIS — Z903 Acquired absence of stomach [part of]: Secondary | ICD-10-CM | POA: Diagnosis not present

## 2022-12-01 DIAGNOSIS — Z9229 Personal history of other drug therapy: Secondary | ICD-10-CM | POA: Diagnosis not present

## 2022-12-01 DIAGNOSIS — Z903 Acquired absence of stomach [part of]: Secondary | ICD-10-CM | POA: Diagnosis not present

## 2022-12-01 DIAGNOSIS — E559 Vitamin D deficiency, unspecified: Secondary | ICD-10-CM | POA: Diagnosis not present

## 2022-12-01 DIAGNOSIS — K912 Postsurgical malabsorption, not elsewhere classified: Secondary | ICD-10-CM | POA: Diagnosis not present

## 2022-12-01 DIAGNOSIS — Z8719 Personal history of other diseases of the digestive system: Secondary | ICD-10-CM | POA: Diagnosis not present

## 2022-12-01 DIAGNOSIS — Z86711 Personal history of pulmonary embolism: Secondary | ICD-10-CM | POA: Diagnosis not present

## 2022-12-01 DIAGNOSIS — K2100 Gastro-esophageal reflux disease with esophagitis, without bleeding: Secondary | ICD-10-CM | POA: Diagnosis not present

## 2022-12-01 DIAGNOSIS — I10 Essential (primary) hypertension: Secondary | ICD-10-CM | POA: Diagnosis not present

## 2022-12-01 DIAGNOSIS — D6862 Lupus anticoagulant syndrome: Secondary | ICD-10-CM | POA: Diagnosis not present

## 2022-12-01 DIAGNOSIS — E039 Hypothyroidism, unspecified: Secondary | ICD-10-CM | POA: Diagnosis not present

## 2022-12-01 DIAGNOSIS — D6859 Other primary thrombophilia: Secondary | ICD-10-CM | POA: Diagnosis not present

## 2022-12-01 DIAGNOSIS — Z9884 Bariatric surgery status: Secondary | ICD-10-CM | POA: Diagnosis not present

## 2022-12-28 ENCOUNTER — Ambulatory Visit (INDEPENDENT_AMBULATORY_CARE_PROVIDER_SITE_OTHER): Payer: Medicare HMO

## 2022-12-28 ENCOUNTER — Encounter: Payer: Self-pay | Admitting: Podiatry

## 2022-12-28 ENCOUNTER — Ambulatory Visit (INDEPENDENT_AMBULATORY_CARE_PROVIDER_SITE_OTHER): Payer: Medicare HMO | Admitting: Podiatry

## 2022-12-28 DIAGNOSIS — L84 Corns and callosities: Secondary | ICD-10-CM

## 2022-12-28 DIAGNOSIS — M2042 Other hammer toe(s) (acquired), left foot: Secondary | ICD-10-CM

## 2022-12-28 DIAGNOSIS — Q828 Other specified congenital malformations of skin: Secondary | ICD-10-CM
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
## 2022-12-29 DIAGNOSIS — M25551 Pain in right hip: Secondary | ICD-10-CM | POA: Diagnosis not present

## 2023-01-05 DIAGNOSIS — Z86718 Personal history of other venous thrombosis and embolism: Secondary | ICD-10-CM | POA: Diagnosis not present

## 2023-01-12 ENCOUNTER — Encounter: Payer: Self-pay | Admitting: Podiatry

## 2023-01-13 ENCOUNTER — Telehealth: Payer: Self-pay | Admitting: Podiatry
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
## 2023-01-18 ENCOUNTER — Telehealth: Payer: Self-pay | Admitting: *Deleted

## 2023-01-18 NOTE — Telephone Encounter (Signed)
Called patient to update that  handicap placard has been approved, completed and ready for pick up, verbalized understanding and will pick up on Wen(8 th).

## 2023-01-20 ENCOUNTER — Encounter: Payer: Self-pay | Admitting: Podiatry

## 2023-01-20 ENCOUNTER — Ambulatory Visit (INDEPENDENT_AMBULATORY_CARE_PROVIDER_SITE_OTHER): Payer: Medicare HMO | Admitting: Podiatry

## 2023-01-20 DIAGNOSIS — T8484XA Pain due to internal orthopedic prosthetic devices, implants and grafts, initial encounter: Secondary | ICD-10-CM | POA: Diagnosis not present

## 2023-01-20 DIAGNOSIS — L84 Corns and callosities: Secondary | ICD-10-CM | POA: Diagnosis not present

## 2023-01-20 DIAGNOSIS — Q828 Other specified congenital malformations of skin: Secondary | ICD-10-CM | POA: Diagnosis not present
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
## 2023-01-28 ENCOUNTER — Encounter: Payer: Self-pay | Admitting: Podiatry

## 2023-01-28 ENCOUNTER — Telehealth: Payer: Self-pay | Admitting: Urology

## 2023-01-28 NOTE — Telephone Encounter (Signed)
DOS - 02/02/23  REMOVAL HARDWARE LEFT --- 20680 EXOSTECTOMY 2ND LEFT --- 16109  [employer]BS EFFECTIVE DATE - 09/14/22  DEDUCTIBLE - $1,500.00 W/ $1,500.00 REMAINING OOP - $5,900.00 W/ $5,860.00 REMAINING COINSURANCE - 30%  SPOKE WITH ERIC R. WITH [employer] AND HE STATED THAT FOR CPT CODES 60454 AND 28108 NO PRIOR AUTH IS REQUIRED.   CALL REF # [id number] R. 01/28/23 AT 8:15 AM EST   [employer]   PER [employer]'S AUTOMATED SYSTEM FOR CPT CODES 09811 AND 28108 NO PRIOR AUTH IS REQUIRED.   CALL REF # [id number]

## 2023-01-29 ENCOUNTER — Telehealth: Payer: Self-pay | Admitting: Podiatry

## 2023-01-29 NOTE — Telephone Encounter (Signed)
That should be good.   Thank you.

## 2023-01-29 NOTE — Telephone Encounter (Signed)
Patient called this morning, stating she didn't know when to stop her blood thinner.  Her chart doesn't have noted that she is on Eliquis BID.  She was in shower during call so couldn't get me the dosage.  Her surgery is on Tuesday.  Advised her to stop the Eliquis after her morning dose on Saturday.  If you prefer something different, please call her at [phone].  Thank you.  -Misha Antonini

## 2023-02-02 ENCOUNTER — Other Ambulatory Visit: Payer: Self-pay | Admitting: Podiatry

## 2023-02-02 DIAGNOSIS — Z472 Encounter for removal of internal fixation device: Secondary | ICD-10-CM | POA: Diagnosis not present

## 2023-02-02 DIAGNOSIS — M89372 Hypertrophy of bone, left ankle and foot: Secondary | ICD-10-CM | POA: Diagnosis not present

## 2023-02-02 DIAGNOSIS — L859 Epidermal thickening, unspecified: Secondary | ICD-10-CM | POA: Diagnosis not present

## 2023-02-02 DIAGNOSIS — Z4889 Encounter for other specified surgical aftercare: Secondary | ICD-10-CM | POA: Diagnosis not present

## 2023-02-02 DIAGNOSIS — T8484XA Pain due to internal orthopedic prosthetic devices, implants and grafts, initial encounter: Secondary | ICD-10-CM | POA: Diagnosis not present

## 2023-02-02 DIAGNOSIS — L84 Corns and callosities: Secondary | ICD-10-CM | POA: Diagnosis not present

## 2023-02-02 DIAGNOSIS — M2041 Other hammer toe(s) (acquired), right foot: Secondary | ICD-10-CM | POA: Diagnosis not present

## 2023-02-02 MED ORDER — OXYCODONE-ACETAMINOPHEN 5-325 MG PO TABS: 1.0000 | ORAL_TABLET | ORAL | 0 refills | Status: AC | PRN

## 2023-02-02 MED ORDER — ONDANSETRON HCL 4 MG PO TABS: 4.0000 mg | ORAL_TABLET | Freq: Three times a day (TID) | ORAL | 0 refills | Status: DC | PRN

## 2023-02-03 ENCOUNTER — Encounter: Payer: Self-pay | Admitting: Podiatry

## 2023-02-09 ENCOUNTER — Encounter: Payer: Self-pay | Admitting: Podiatry

## 2023-02-09 ENCOUNTER — Ambulatory Visit (INDEPENDENT_AMBULATORY_CARE_PROVIDER_SITE_OTHER): Payer: Medicare HMO

## 2023-02-09 ENCOUNTER — Ambulatory Visit (INDEPENDENT_AMBULATORY_CARE_PROVIDER_SITE_OTHER): Payer: Medicare HMO | Admitting: Podiatry

## 2023-02-09 DIAGNOSIS — Z9889 Other specified postprocedural states: Secondary | ICD-10-CM

## 2023-02-09 NOTE — Progress Notes (Signed)
Subjective:  Patient ID: Erin Vasquez, female    DOB: [birth year],  MRN: [id number]  No chief complaint on file.   DOS: 02/02/23 Procedure: Left foot hardware removal, left second toe exostectomy   67 y.o. female returns for POV#1. Relates doing well with minimal pain  Review of Systems: Negative except as noted in the HPI. Denies N/V/F/Ch.  Past Medical History:  Diagnosis Date   Anemia    during pregnancy   Anxiety    Arthritis    "all over" (07/07/2017)   Bilateral pulmonary embolism (HCC) 10/29/2015   "from my left ankle"   Childhood asthma    as child, nothing as adult   Chronic back pain    "all over" (07/07/2017)   DVT (deep venous thrombosis) (HCC) 10/29/2015   "from my left ankle"   GERD (gastroesophageal reflux disease)    tx. Tums   H/O hiatal hernia    "repaired w/gastric bypass OR" (07/07/2017)   History of esophageal dilatation    Hypertension    Hypothyroidism    Lupus anticoagulant disorder (HCC)    Dr. Clelia Croft    Neuromuscular disorder [facility])    L thigh - Numbness, nerve damage     Sacroiliac pain    "left thigh has been numb since 01/2016 after appendectomy" (07/07/2017)   Sleep apnea    07/07/2017 "bariatric dr says I don't need CPAP anymore" (07/07/2017)   Thyroid disease    Uterine cancer (HCC) 01/2014   Varicose veins    Ventral hernia    Vitamin D deficiency     Current Outpatient Medications:    amLODipine (NORVASC) 2.5 MG tablet, Take 2.5 mg by mouth at bedtime. , Disp: , Rfl:    baclofen (LIORESAL) 10 MG tablet, Take 1 tablet (10 mg total) by mouth 2 (two) times daily as needed for muscle spasms., Disp: 20 each, Rfl: 0   Calcium Carbonate-Vitamin D (CALTRATE 600+D PO), Take 1 tablet by mouth daily., Disp: , Rfl:    Cholecalciferol (VITAMIN D) 50 MCG (2000 UT) tablet, Take 2,000 Units by mouth daily., Disp: , Rfl:    clindamycin (CLEOCIN) 300 MG capsule, , Disp: , Rfl:    diclofenac Sodium (VOLTAREN) 1 % GEL, , Disp: , Rfl:     famotidine (PEPCID) 20 MG tablet, Take 20 mg by mouth daily. , Disp: , Rfl:    fluticasone (CUTIVATE) 0.05 % cream, Apply 1 application topically 2 (two) times daily as needed (rash)., Disp: , Rfl:    gabapentin (NEURONTIN) 300 MG capsule, Take 1 capsule (300 mg total) by mouth 2 (two) times daily as needed for up to 15 days. For 2 weeks post op for pain., Disp: 30 capsule, Rfl: 0   levothyroxine (SYNTHROID, LEVOTHROID) 50 MCG tablet, Take 50 mcg by mouth daily before breakfast. , Disp: , Rfl:    methylPREDNISolone acetate (DEPO-MEDROL) 40 MG/ML injection, SMARTSIG:2 Milliliter(s) IM Once PRN, Disp: , Rfl:    Multiple Vitamin (MULTIVITAMIN WITH MINERALS) TABS tablet, Take 1 tablet by mouth 2 (two) times daily. , Disp: , Rfl:    omeprazole (PRILOSEC) 40 MG capsule, Take 40 mg by mouth every evening. , Disp: , Rfl:    ondansetron (ZOFRAN) 4 MG tablet, Take 1 tablet (4 mg total) by mouth every 8 (eight) hours as needed for nausea or vomiting., Disp: 20 tablet, Rfl: 0   ondansetron (ZOFRAN) 4 MG tablet, Take 1 tablet (4 mg total) by mouth every 8 (eight) hours as needed for nausea or vomiting.,  Disp: 20 tablet, Rfl: 0   pseudoephedrine (SUDAFED) 120 MG 12 hr tablet, Take 120 mg by mouth every 12 (twelve) hours as needed for congestion., Disp: , Rfl:    sulfamethoxazole-trimethoprim (BACTRIM DS) 800-160 MG tablet, Take 1 tablet by mouth 2 (two) times daily., Disp: , Rfl:    triamcinolone ointment (KENALOG) 0.5 %, Apply 1 application topically 2 (two) times daily as needed (rash)., Disp: , Rfl:   Social History   Tobacco Use  Smoking Status Never  Smokeless Tobacco Former   Types: Chew  Tobacco Comments   "chewed as a teen"    Allergies  Allergen Reactions   Bee Venom Swelling, Rash and Other (See Comments)    Patient is allergic to bee venom, mosquitos, wasps, yellow jackets and bees. Localized swelling Fever     Hymenoptera Venom Preparations Rash and Other (See Comments)    Patient is  allergic to bee venom, mosquitos, wasps, yellow jackets and bees. Localized swelling Fever   Other Anaphylaxis, Shortness Of Breath, Itching and Rash    HICKORY SMOKE    Penicillins Anaphylaxis and Other (See Comments)    Did it involve swelling of the face/tongue/throat, SOB, or low BP? Yes Did it involve sudden or severe rash/hives, skin peeling, or any reaction on the inside of your mouth or nose? No Did you need to seek medical attention at a hospital or doctor's office? Yes When did it last happen?      childhood allergy If all above answers are "NO", may proceed with cephalosporin use.    Latex Rash    Latex rash due to elastic in zip-up TED hose at home (local rash), pt. Has had flu vaccine before without problems.   Morphine And Codeine Itching and Swelling   Plastibase Rash   Tangerine Flavor Rash   Objective:  There were no vitals filed for this visit. There is no height or weight on file to calculate BMI. Constitutional Well developed. Well nourished.  Vascular Foot warm and well perfused. Capillary refill normal to all digits.   Neurologic Normal speech. Oriented to person, place, and time. Epicritic sensation to light touch grossly present bilaterally.  Dermatologic Skin healing well without signs of infection. Skin edges well coapted without signs of infection.  Orthopedic: Tenderness to palpation noted about the surgical site.   Radiographs: Interval removal of hardware in first metatarsal. Interval reduction of lateral prominence on right second proximal interphalangeal joint  Assessment:   1. Post-operative state    Plan:  Patient was evaluated and treated and all questions answered.  S/p foot surgery left -Progressing as expected post-operatively. -WB Status: WBAT in surgical shoe  -Sutures: intact. -Medications: n/a -Foot redressed.  Return in 2 weeks for suture removal.   No follow-ups on file.

## 2023-02-23 ENCOUNTER — Encounter: Payer: Self-pay | Admitting: Podiatry

## 2023-02-23 ENCOUNTER — Ambulatory Visit (INDEPENDENT_AMBULATORY_CARE_PROVIDER_SITE_OTHER): Payer: Medicare HMO | Admitting: Podiatry

## 2023-02-23 DIAGNOSIS — Z9889 Other specified postprocedural states: Secondary | ICD-10-CM

## 2023-02-23 NOTE — Progress Notes (Signed)
Subjective:  Patient ID: Erin Vasquez, female    DOB: [birth year],  MRN: [id number]  Chief Complaint  Patient presents with   Routine Post Op    POV #2 DOS 02/02/2023 LT FOOR HARDWARE REMOVAL, LT 2ND TOE EXOSTECTOMY    DOS: 02/02/23 Procedure: Left foot hardware removal, left second toe exostectomy   67 y.o. female returns for POV#2. Relates doing well with minimal pain  Review of Systems: Negative except as noted in the HPI. Denies N/V/F/Ch.  Past Medical History:  Diagnosis Date   Anemia    during pregnancy   Anxiety    Arthritis    "all over" (07/07/2017)   Bilateral pulmonary embolism (HCC) 10/29/2015   "from my left ankle"   Childhood asthma    as child, nothing as adult   Chronic back pain    "all over" (07/07/2017)   DVT (deep venous thrombosis) (HCC) 10/29/2015   "from my left ankle"   GERD (gastroesophageal reflux disease)    tx. Tums   H/O hiatal hernia    "repaired w/gastric bypass OR" (07/07/2017)   History of esophageal dilatation    Hypertension    Hypothyroidism    Lupus anticoagulant disorder (HCC)    Dr. Clelia Croft    Neuromuscular disorder [facility])    L thigh - Numbness, nerve damage     Sacroiliac pain    "left thigh has been numb since 01/2016 after appendectomy" (07/07/2017)   Sleep apnea    07/07/2017 "bariatric dr says I don't need CPAP anymore" (07/07/2017)   Thyroid disease    Uterine cancer (HCC) 01/2014   Varicose veins    Ventral hernia    Vitamin D deficiency     Current Outpatient Medications:    amLODipine (NORVASC) 2.5 MG tablet, Take 2.5 mg by mouth at bedtime. , Disp: , Rfl:    baclofen (LIORESAL) 10 MG tablet, Take 1 tablet (10 mg total) by mouth 2 (two) times daily as needed for muscle spasms., Disp: 20 each, Rfl: 0   Calcium Carbonate-Vitamin D (CALTRATE 600+D PO), Take 1 tablet by mouth daily., Disp: , Rfl:    Cholecalciferol (VITAMIN D) 50 MCG (2000 UT) tablet, Take 2,000 Units by mouth daily., Disp: , Rfl:    clindamycin  (CLEOCIN) 300 MG capsule, , Disp: , Rfl:    diclofenac Sodium (VOLTAREN) 1 % GEL, , Disp: , Rfl:    famotidine (PEPCID) 20 MG tablet, Take 20 mg by mouth daily. , Disp: , Rfl:    fluticasone (CUTIVATE) 0.05 % cream, Apply 1 application topically 2 (two) times daily as needed (rash)., Disp: , Rfl:    gabapentin (NEURONTIN) 300 MG capsule, Take 1 capsule (300 mg total) by mouth 2 (two) times daily as needed for up to 15 days. For 2 weeks post op for pain., Disp: 30 capsule, Rfl: 0   levothyroxine (SYNTHROID, LEVOTHROID) 50 MCG tablet, Take 50 mcg by mouth daily before breakfast. , Disp: , Rfl:    methylPREDNISolone acetate (DEPO-MEDROL) 40 MG/ML injection, SMARTSIG:2 Milliliter(s) IM Once PRN, Disp: , Rfl:    Multiple Vitamin (MULTIVITAMIN WITH MINERALS) TABS tablet, Take 1 tablet by mouth 2 (two) times daily. , Disp: , Rfl:    omeprazole (PRILOSEC) 40 MG capsule, Take 40 mg by mouth every evening. , Disp: , Rfl:    ondansetron (ZOFRAN) 4 MG tablet, Take 1 tablet (4 mg total) by mouth every 8 (eight) hours as needed for nausea or vomiting., Disp: 20 tablet, Rfl: 0   ondansetron (  ZOFRAN) 4 MG tablet, Take 1 tablet (4 mg total) by mouth every 8 (eight) hours as needed for nausea or vomiting., Disp: 20 tablet, Rfl: 0   pseudoephedrine (SUDAFED) 120 MG 12 hr tablet, Take 120 mg by mouth every 12 (twelve) hours as needed for congestion., Disp: , Rfl:    sulfamethoxazole-trimethoprim (BACTRIM DS) 800-160 MG tablet, Take 1 tablet by mouth 2 (two) times daily., Disp: , Rfl:    triamcinolone ointment (KENALOG) 0.5 %, Apply 1 application topically 2 (two) times daily as needed (rash)., Disp: , Rfl:   Social History   Tobacco Use  Smoking Status Never  Smokeless Tobacco Former   Types: Chew  Tobacco Comments   "chewed as a teen"    Allergies  Allergen Reactions   Bee Venom Swelling, Rash and Other (See Comments)    Patient is allergic to bee venom, mosquitos, wasps, yellow jackets and bees. Localized  swelling Fever     Hymenoptera Venom Preparations Rash and Other (See Comments)    Patient is allergic to bee venom, mosquitos, wasps, yellow jackets and bees. Localized swelling Fever   Other Anaphylaxis, Shortness Of Breath, Itching and Rash    HICKORY SMOKE    Penicillins Anaphylaxis and Other (See Comments)    Did it involve swelling of the face/tongue/throat, SOB, or low BP? Yes Did it involve sudden or severe rash/hives, skin peeling, or any reaction on the inside of your mouth or nose? No Did you need to seek medical attention at a hospital or doctor's office? Yes When did it last happen?      childhood allergy If all above answers are "NO", may proceed with cephalosporin use.    Latex Rash    Latex rash due to elastic in zip-up TED hose at home (local rash), pt. Has had flu vaccine before without problems.   Morphine And Codeine Itching and Swelling   Plastibase Rash   Tangerine Flavor Rash   Objective:  There were no vitals filed for this visit. There is no height or weight on file to calculate BMI. Constitutional Well developed. Well nourished.  Vascular Foot warm and well perfused. Capillary refill normal to all digits.   Neurologic Normal speech. Oriented to person, place, and time. Epicritic sensation to light touch grossly present bilaterally.  Dermatologic Skin healing well without signs of infection. Skin edges well coapted without signs of infection.  Orthopedic: Tenderness to palpation noted about the surgical site.   Radiographs: Interval removal of hardware in first metatarsal. Interval reduction of lateral prominence on right second proximal interphalangeal joint  Assessment:   1. Post-operative state    Plan:  Patient was evaluated and treated and all questions answered.  S/p foot surgery left -Progressing as expected post-operatively. -WB Status: WBAT in regular shoe -Sutures: removed without incident.  -Medications: n/a -Foot  redressed.  Return in 3 weeks for post-op check   Return in about 3 weeks (around 03/16/2023) for post op.

## 2023-03-03 DIAGNOSIS — R0789 Other chest pain: Secondary | ICD-10-CM | POA: Diagnosis not present

## 2023-03-03 DIAGNOSIS — E039 Hypothyroidism, unspecified: Secondary | ICD-10-CM | POA: Diagnosis not present

## 2023-03-03 DIAGNOSIS — I10 Essential (primary) hypertension: Secondary | ICD-10-CM | POA: Diagnosis not present

## 2023-03-04 DIAGNOSIS — Z1231 Encounter for screening mammogram for malignant neoplasm of breast: Secondary | ICD-10-CM | POA: Diagnosis not present

## 2023-03-04 DIAGNOSIS — R92323 Mammographic fibroglandular density, bilateral breasts: Secondary | ICD-10-CM | POA: Diagnosis not present

## 2023-03-09 DIAGNOSIS — M85851 Other specified disorders of bone density and structure, right thigh: Secondary | ICD-10-CM | POA: Diagnosis not present

## 2023-03-09 DIAGNOSIS — E559 Vitamin D deficiency, unspecified: Secondary | ICD-10-CM | POA: Diagnosis not present

## 2023-03-09 DIAGNOSIS — I10 Essential (primary) hypertension: Secondary | ICD-10-CM | POA: Diagnosis not present

## 2023-03-09 DIAGNOSIS — M85852 Other specified disorders of bone density and structure, left thigh: Secondary | ICD-10-CM | POA: Diagnosis not present

## 2023-03-09 DIAGNOSIS — Z79899 Other long term (current) drug therapy: Secondary | ICD-10-CM | POA: Diagnosis not present

## 2023-03-09 DIAGNOSIS — E039 Hypothyroidism, unspecified: Secondary | ICD-10-CM | POA: Diagnosis not present

## 2023-03-09 DIAGNOSIS — Z903 Acquired absence of stomach [part of]: Secondary | ICD-10-CM | POA: Diagnosis not present

## 2023-03-09 DIAGNOSIS — Z9884 Bariatric surgery status: Secondary | ICD-10-CM | POA: Diagnosis not present

## 2023-03-09 DIAGNOSIS — Z780 Asymptomatic menopausal state: Secondary | ICD-10-CM | POA: Diagnosis not present

## 2023-03-09 DIAGNOSIS — K912 Postsurgical malabsorption, not elsewhere classified: Secondary | ICD-10-CM | POA: Diagnosis not present

## 2023-03-23 ENCOUNTER — Ambulatory Visit: Payer: Medicare HMO

## 2023-03-23 ENCOUNTER — Ambulatory Visit (INDEPENDENT_AMBULATORY_CARE_PROVIDER_SITE_OTHER): Payer: Medicare HMO | Admitting: Podiatry

## 2023-03-23 ENCOUNTER — Encounter: Payer: Self-pay | Admitting: Podiatry

## 2023-03-23 DIAGNOSIS — Z9889 Other specified postprocedural states: Secondary | ICD-10-CM | POA: Diagnosis not present

## 2023-03-23 NOTE — Progress Notes (Signed)
Subjective:  Patient ID: Erin Vasquez, female    DOB: 1955/10/05,  MRN: [id number]  Chief Complaint  Patient presents with   Routine Post Op    POV #3 DOS 02/02/2023 LT FOOR HARDWARE REMOVAL, LT 2ND TOE EXOSTECTOMY  Patient states foot is doing much better since the last visit , states there has been no pain but some swelling    DOS: 02/02/23 Procedure: Left foot hardware removal, left second toe exostectomy   67 y.o. female returns for POV#3. Relates doing well with minimal pain  Review of Systems: Negative except as noted in the HPI. Denies N/V/F/Ch.  Past Medical History:  Diagnosis Date   Anemia    during pregnancy   Anxiety    Arthritis    "all over" (07/07/2017)   Bilateral pulmonary embolism (HCC) 10/29/2015   "from my left ankle"   Childhood asthma    as child, nothing as adult   Chronic back pain    "all over" (07/07/2017)   DVT (deep venous thrombosis) (HCC) 10/29/2015   "from my left ankle"   GERD (gastroesophageal reflux disease)    tx. Tums   H/O hiatal hernia    "repaired w/gastric bypass OR" (07/07/2017)   History of esophageal dilatation    Hypertension    Hypothyroidism    Lupus anticoagulant disorder (HCC)    Dr. Clelia Croft    Neuromuscular disorder [facility])    L thigh - Numbness, nerve damage     Sacroiliac pain    "left thigh has been numb since 01/2016 after appendectomy" (07/07/2017)   Sleep apnea    07/07/2017 "bariatric dr says I don't need CPAP anymore" (07/07/2017)   Thyroid disease    Uterine cancer (HCC) 01/2014   Varicose veins    Ventral hernia    Vitamin D deficiency     Current Outpatient Medications:    amLODipine (NORVASC) 2.5 MG tablet, Take 2.5 mg by mouth at bedtime. , Disp: , Rfl:    baclofen (LIORESAL) 10 MG tablet, Take 1 tablet (10 mg total) by mouth 2 (two) times daily as needed for muscle spasms., Disp: 20 each, Rfl: 0   Calcium Carbonate-Vitamin D (CALTRATE 600+D PO), Take 1 tablet by mouth daily., Disp: , Rfl:     Cholecalciferol (VITAMIN D) 50 MCG (2000 UT) tablet, Take 2,000 Units by mouth daily., Disp: , Rfl:    clindamycin (CLEOCIN) 300 MG capsule, , Disp: , Rfl:    diclofenac Sodium (VOLTAREN) 1 % GEL, , Disp: , Rfl:    famotidine (PEPCID) 20 MG tablet, Take 20 mg by mouth daily. , Disp: , Rfl:    fluticasone (CUTIVATE) 0.05 % cream, Apply 1 application topically 2 (two) times daily as needed (rash)., Disp: , Rfl:    gabapentin (NEURONTIN) 300 MG capsule, Take 1 capsule (300 mg total) by mouth 2 (two) times daily as needed for up to 15 days. For 2 weeks post op for pain., Disp: 30 capsule, Rfl: 0   levothyroxine (SYNTHROID, LEVOTHROID) 50 MCG tablet, Take 50 mcg by mouth daily before breakfast. , Disp: , Rfl:    methylPREDNISolone acetate (DEPO-MEDROL) 40 MG/ML injection, SMARTSIG:2 Milliliter(s) IM Once PRN, Disp: , Rfl:    Multiple Vitamin (MULTIVITAMIN WITH MINERALS) TABS tablet, Take 1 tablet by mouth 2 (two) times daily. , Disp: , Rfl:    omeprazole (PRILOSEC) 40 MG capsule, Take 40 mg by mouth every evening. , Disp: , Rfl:    ondansetron (ZOFRAN) 4 MG tablet, Take 1 tablet (4  mg total) by mouth every 8 (eight) hours as needed for nausea or vomiting., Disp: 20 tablet, Rfl: 0   ondansetron (ZOFRAN) 4 MG tablet, Take 1 tablet (4 mg total) by mouth every 8 (eight) hours as needed for nausea or vomiting., Disp: 20 tablet, Rfl: 0   pseudoephedrine (SUDAFED) 120 MG 12 hr tablet, Take 120 mg by mouth every 12 (twelve) hours as needed for congestion., Disp: , Rfl:    sulfamethoxazole-trimethoprim (BACTRIM DS) 800-160 MG tablet, Take 1 tablet by mouth 2 (two) times daily., Disp: , Rfl:    triamcinolone ointment (KENALOG) 0.5 %, Apply 1 application topically 2 (two) times daily as needed (rash)., Disp: , Rfl:   Social History   Tobacco Use  Smoking Status Never  Smokeless Tobacco Former   Types: Chew  Tobacco Comments   "chewed as a teen"    Allergies  Allergen Reactions   Bee Venom Swelling, Rash  and Other (See Comments)    Patient is allergic to bee venom, mosquitos, wasps, yellow jackets and bees. Localized swelling Fever     Hymenoptera Venom Preparations Rash and Other (See Comments)    Patient is allergic to bee venom, mosquitos, wasps, yellow jackets and bees. Localized swelling Fever   Other Anaphylaxis, Shortness Of Breath, Itching and Rash    HICKORY SMOKE    Penicillins Anaphylaxis and Other (See Comments)    Did it involve swelling of the face/tongue/throat, SOB, or low BP? Yes Did it involve sudden or severe rash/hives, skin peeling, or any reaction on the inside of your mouth or nose? No Did you need to seek medical attention at a hospital or doctor's office? Yes When did it last happen?      childhood allergy If all above answers are "NO", may proceed with cephalosporin use.    Latex Rash    Latex rash due to elastic in zip-up TED hose at home (local rash), pt. Has had flu vaccine before without problems.   Morphine And Codeine Itching and Swelling   Plastibase Rash   Tangerine Flavor Rash   Objective:  There were no vitals filed for this visit. There is no height or weight on file to calculate BMI. Constitutional Well developed. Well nourished.  Vascular Foot warm and well perfused. Capillary refill normal to all digits.   Neurologic Normal speech. Oriented to person, place, and time. Epicritic sensation to light touch grossly present bilaterally.  Dermatologic Skin healing well without signs of infection. Skin edges well coapted without signs of infection.  Orthopedic: Tenderness to palpation noted about the surgical site.   Radiographs: Interval removal of hardware in first metatarsal. Interval reduction of lateral prominence on right second proximal interphalangeal joint  Assessment:   1. Post-operative state     Plan:  Patient was evaluated and treated and all questions answered.  S/p foot surgery left -Progressing as expected  post-operatively. -WB Status: continue WBAT in regular shoe -Medications: n/a -Patient progressing well.  Patient discharged from surgical standpoing.   Return if symptoms worsen or fail to improve.

## 2023-04-28 DIAGNOSIS — I82409 Acute embolism and thrombosis of unspecified deep veins of unspecified lower extremity: Secondary | ICD-10-CM | POA: Diagnosis not present

## 2023-04-28 DIAGNOSIS — R760 Raised antibody titer: Secondary | ICD-10-CM | POA: Diagnosis not present

## 2023-04-28 DIAGNOSIS — D6861 Antiphospholipid syndrome: Secondary | ICD-10-CM | POA: Diagnosis not present

## 2023-04-28 DIAGNOSIS — Z86711 Personal history of pulmonary embolism: Secondary | ICD-10-CM | POA: Diagnosis not present

## 2023-04-28 DIAGNOSIS — Z7901 Long term (current) use of anticoagulants: Secondary | ICD-10-CM | POA: Diagnosis not present

## 2023-05-11 DIAGNOSIS — R69 Illness, unspecified: Secondary | ICD-10-CM | POA: Diagnosis not present

## 2023-05-12 DIAGNOSIS — L987 Excessive and redundant skin and subcutaneous tissue: Secondary | ICD-10-CM | POA: Diagnosis not present

## 2023-05-12 DIAGNOSIS — Z86718 Personal history of other venous thrombosis and embolism: Secondary | ICD-10-CM | POA: Diagnosis not present

## 2023-05-14 DIAGNOSIS — I82412 Acute embolism and thrombosis of left femoral vein: Secondary | ICD-10-CM | POA: Diagnosis not present

## 2023-05-14 DIAGNOSIS — I82432 Acute embolism and thrombosis of left popliteal vein: Secondary | ICD-10-CM | POA: Diagnosis not present

## 2023-05-14 DIAGNOSIS — Z86718 Personal history of other venous thrombosis and embolism: Secondary | ICD-10-CM | POA: Diagnosis not present

## 2023-05-21 DIAGNOSIS — R69 Illness, unspecified: Secondary | ICD-10-CM | POA: Diagnosis not present

## 2023-05-26 DIAGNOSIS — R69 Illness, unspecified: Secondary | ICD-10-CM | POA: Diagnosis not present

## 2023-05-31 DIAGNOSIS — Z7901 Long term (current) use of anticoagulants: Secondary | ICD-10-CM | POA: Diagnosis not present

## 2023-05-31 DIAGNOSIS — I10 Essential (primary) hypertension: Secondary | ICD-10-CM | POA: Diagnosis not present

## 2023-05-31 DIAGNOSIS — I82409 Acute embolism and thrombosis of unspecified deep veins of unspecified lower extremity: Secondary | ICD-10-CM | POA: Diagnosis not present

## 2023-05-31 DIAGNOSIS — R760 Raised antibody titer: Secondary | ICD-10-CM | POA: Diagnosis not present

## 2023-05-31 DIAGNOSIS — D6861 Antiphospholipid syndrome: Secondary | ICD-10-CM | POA: Diagnosis not present

## 2023-05-31 DIAGNOSIS — Z86711 Personal history of pulmonary embolism: Secondary | ICD-10-CM | POA: Diagnosis not present

## 2023-06-01 DIAGNOSIS — R69 Illness, unspecified: Secondary | ICD-10-CM | POA: Diagnosis not present

## 2023-06-02 DIAGNOSIS — Z86718 Personal history of other venous thrombosis and embolism: Secondary | ICD-10-CM | POA: Diagnosis not present

## 2023-06-02 DIAGNOSIS — M79605 Pain in left leg: Secondary | ICD-10-CM | POA: Diagnosis not present

## 2023-06-02 DIAGNOSIS — M79604 Pain in right leg: Secondary | ICD-10-CM | POA: Diagnosis not present

## 2023-06-03 DIAGNOSIS — M79605 Pain in left leg: Secondary | ICD-10-CM | POA: Diagnosis not present

## 2023-06-03 DIAGNOSIS — I82512 Chronic embolism and thrombosis of left femoral vein: Secondary | ICD-10-CM | POA: Diagnosis not present

## 2023-06-03 DIAGNOSIS — I82532 Chronic embolism and thrombosis of left popliteal vein: Secondary | ICD-10-CM | POA: Diagnosis not present

## 2023-06-03 DIAGNOSIS — M79604 Pain in right leg: Secondary | ICD-10-CM | POA: Diagnosis not present

## 2023-06-03 DIAGNOSIS — Z86718 Personal history of other venous thrombosis and embolism: Secondary | ICD-10-CM | POA: Diagnosis not present

## 2023-06-11 DIAGNOSIS — R69 Illness, unspecified: Secondary | ICD-10-CM | POA: Diagnosis not present

## 2023-06-16 DIAGNOSIS — Z7901 Long term (current) use of anticoagulants: Secondary | ICD-10-CM | POA: Diagnosis not present

## 2023-06-16 DIAGNOSIS — Z86718 Personal history of other venous thrombosis and embolism: Secondary | ICD-10-CM | POA: Diagnosis not present

## 2023-06-16 DIAGNOSIS — I82409 Acute embolism and thrombosis of unspecified deep veins of unspecified lower extremity: Secondary | ICD-10-CM | POA: Diagnosis not present

## 2023-06-16 DIAGNOSIS — Z86711 Personal history of pulmonary embolism: Secondary | ICD-10-CM | POA: Diagnosis not present

## 2023-06-17 DIAGNOSIS — C541 Malignant neoplasm of endometrium: Secondary | ICD-10-CM | POA: Diagnosis not present

## 2023-06-17 DIAGNOSIS — I10 Essential (primary) hypertension: Secondary | ICD-10-CM | POA: Diagnosis not present

## 2023-06-18 DIAGNOSIS — M793 Panniculitis, unspecified: Secondary | ICD-10-CM | POA: Diagnosis not present

## 2023-06-19 DIAGNOSIS — R69 Illness, unspecified: Secondary | ICD-10-CM | POA: Diagnosis not present

## 2023-06-21 DIAGNOSIS — R69 Illness, unspecified: Secondary | ICD-10-CM | POA: Diagnosis not present

## 2023-06-22 DIAGNOSIS — R2241 Localized swelling, mass and lump, right lower limb: Secondary | ICD-10-CM | POA: Diagnosis not present

## 2023-06-25 ENCOUNTER — Telehealth: Payer: Self-pay | Admitting: Podiatry
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
## 2023-07-05 DIAGNOSIS — R222 Localized swelling, mass and lump, trunk: Secondary | ICD-10-CM | POA: Diagnosis not present

## 2023-07-05 DIAGNOSIS — Z9229 Personal history of other drug therapy: Secondary | ICD-10-CM | POA: Diagnosis not present

## 2023-07-05 DIAGNOSIS — E65 Localized adiposity: Secondary | ICD-10-CM | POA: Diagnosis not present

## 2023-07-05 DIAGNOSIS — Z903 Acquired absence of stomach [part of]: Secondary | ICD-10-CM | POA: Diagnosis not present

## 2023-07-05 DIAGNOSIS — Z0000 Encounter for general adult medical examination without abnormal findings: Secondary | ICD-10-CM | POA: Diagnosis not present

## 2023-07-05 DIAGNOSIS — I10 Essential (primary) hypertension: Secondary | ICD-10-CM | POA: Diagnosis not present

## 2023-07-05 DIAGNOSIS — Z86718 Personal history of other venous thrombosis and embolism: Secondary | ICD-10-CM | POA: Diagnosis not present

## 2023-07-05 DIAGNOSIS — K912 Postsurgical malabsorption, not elsewhere classified: Secondary | ICD-10-CM | POA: Diagnosis not present

## 2023-07-05 DIAGNOSIS — Z8542 Personal history of malignant neoplasm of other parts of uterus: Secondary | ICD-10-CM | POA: Diagnosis not present

## 2023-07-05 DIAGNOSIS — Z9884 Bariatric surgery status: Secondary | ICD-10-CM | POA: Diagnosis not present

## 2023-07-19 DIAGNOSIS — R222 Localized swelling, mass and lump, trunk: Secondary | ICD-10-CM | POA: Diagnosis not present

## 2023-07-19 DIAGNOSIS — Z8542 Personal history of malignant neoplasm of other parts of uterus: Secondary | ICD-10-CM | POA: Diagnosis not present

## 2023-08-04 ENCOUNTER — Telehealth: Payer: Self-pay | Admitting: Podiatry

## 2023-08-04 NOTE — Telephone Encounter (Signed)
Patient called and requested that you personally give her a call because she has some concerns shed like to address. I know it has to do partially with payment of the SX and the billing POV I've notified Billing and the Auth team in regards to that matter, other than that she would like to speak with you if you could give her a call at [phone]

## 2023-08-05 NOTE — Telephone Encounter (Signed)
Attempted to call twice. No answer. Left message.

## 2023-09-28 ENCOUNTER — Encounter: Payer: Medicare Other | Admitting: Plastic Surgery

## 2023-11-10 DIAGNOSIS — Z86711 Personal history of pulmonary embolism: Secondary | ICD-10-CM | POA: Diagnosis not present

## 2023-11-10 DIAGNOSIS — D6861 Antiphospholipid syndrome: Secondary | ICD-10-CM | POA: Diagnosis not present

## 2023-11-10 DIAGNOSIS — I82409 Acute embolism and thrombosis of unspecified deep veins of unspecified lower extremity: Secondary | ICD-10-CM | POA: Diagnosis not present

## 2023-11-10 DIAGNOSIS — R760 Raised antibody titer: Secondary | ICD-10-CM | POA: Diagnosis not present

## 2023-11-10 DIAGNOSIS — Z7901 Long term (current) use of anticoagulants: Secondary | ICD-10-CM | POA: Diagnosis not present

## 2023-11-15 DIAGNOSIS — E039 Hypothyroidism, unspecified: Secondary | ICD-10-CM | POA: Diagnosis not present

## 2023-11-15 DIAGNOSIS — K912 Postsurgical malabsorption, not elsewhere classified: Secondary | ICD-10-CM | POA: Diagnosis not present

## 2023-11-15 DIAGNOSIS — Z9884 Bariatric surgery status: Secondary | ICD-10-CM | POA: Diagnosis not present

## 2023-11-30 DIAGNOSIS — G8929 Other chronic pain: Secondary | ICD-10-CM | POA: Diagnosis not present

## 2023-11-30 DIAGNOSIS — M25552 Pain in left hip: Secondary | ICD-10-CM | POA: Diagnosis not present

## 2023-11-30 DIAGNOSIS — M79605 Pain in left leg: Secondary | ICD-10-CM | POA: Diagnosis not present

## 2023-11-30 DIAGNOSIS — I10 Essential (primary) hypertension: Secondary | ICD-10-CM | POA: Diagnosis not present

## 2023-11-30 DIAGNOSIS — Z87891 Personal history of nicotine dependence: Secondary | ICD-10-CM | POA: Diagnosis not present

## 2023-12-01 ENCOUNTER — Telehealth: Payer: Self-pay | Admitting: Licensed Clinical Social Worker

## 2023-12-01 ENCOUNTER — Other Ambulatory Visit (HOSPITAL_COMMUNITY): Payer: Self-pay | Admitting: Orthopaedic Surgery

## 2023-12-01 DIAGNOSIS — M25552 Pain in left hip: Secondary | ICD-10-CM | POA: Diagnosis not present

## 2023-12-01 DIAGNOSIS — M5416 Radiculopathy, lumbar region: Secondary | ICD-10-CM | POA: Diagnosis not present

## 2023-12-01 DIAGNOSIS — M79604 Pain in right leg: Secondary | ICD-10-CM

## 2023-12-01 NOTE — Telephone Encounter (Signed)
 H&V Care Navigation CSW Progress Note  Clinical Social Worker  received referral from scheduler with questions related to wheelchair to assist pt in and out of building due to pain. Deferred these logistics to the clinical team (RN/LPN/Imaging Team Lead) for safety purposes.   Patient is participating in a Managed Medicaid Plan:  No, [employer] Medicare  SDOH Screenings   Food Insecurity: No Food Insecurity (07/05/2023)   Received from [facility]  Transportation Needs: No Transportation Needs (07/05/2023)   Received from [facility]  Utilities: Not At Risk (07/05/2023)   Received from [facility]  Financial Resource Strain: Medium Risk (07/05/2023)   Received from [facility]  Physical Activity: Sufficiently Active (07/05/2023)   Received from [facility]  Recent Concern: Physical Activity - Insufficiently Active (06/16/2023)   Received from [facility]  Social Connections: Socially Integrated (07/05/2023)   Received from [facility]  Stress: No Stress Concern Present (07/05/2023)   Received from [facility]  Tobacco Use: Medium Risk (11/10/2023)   Received from [facility]     Octavio Graves, MSW, LCSW Clinical Social Worker II [facility] Heart/Vascular Care Navigation  [phone]- work cell phone (preferred) [phone]- desk phone

## 2023-12-02 ENCOUNTER — Ambulatory Visit (HOSPITAL_COMMUNITY)
Admission: RE | Admit: 2023-12-02 | Discharge: 2023-12-02 | Disposition: A | Discharge status: Home / Self Care | Source: Ambulatory Visit | Attending: Cardiovascular Disease | Admitting: Cardiovascular Disease

## 2023-12-02 DIAGNOSIS — M79604 Pain in right leg: Secondary | ICD-10-CM | POA: Insufficient documentation

## 2023-12-02 DIAGNOSIS — M79605 Pain in left leg: Secondary | ICD-10-CM

## 2023-12-03 DIAGNOSIS — K2100 Gastro-esophageal reflux disease with esophagitis, without bleeding: Secondary | ICD-10-CM | POA: Diagnosis not present

## 2023-12-03 DIAGNOSIS — G4733 Obstructive sleep apnea (adult) (pediatric): Secondary | ICD-10-CM | POA: Diagnosis not present

## 2023-12-03 DIAGNOSIS — E869 Volume depletion, unspecified: Secondary | ICD-10-CM | POA: Diagnosis not present

## 2023-12-03 DIAGNOSIS — M48061 Spinal stenosis, lumbar region without neurogenic claudication: Secondary | ICD-10-CM | POA: Diagnosis not present

## 2023-12-03 DIAGNOSIS — J8410 Pulmonary fibrosis, unspecified: Secondary | ICD-10-CM | POA: Diagnosis not present

## 2023-12-03 DIAGNOSIS — I10 Essential (primary) hypertension: Secondary | ICD-10-CM | POA: Diagnosis not present

## 2023-12-03 DIAGNOSIS — I82512 Chronic embolism and thrombosis of left femoral vein: Secondary | ICD-10-CM | POA: Diagnosis not present

## 2023-12-03 DIAGNOSIS — R000 Tachycardia, unspecified: Secondary | ICD-10-CM | POA: Diagnosis not present

## 2023-12-03 DIAGNOSIS — M7989 Other specified soft tissue disorders: Secondary | ICD-10-CM | POA: Diagnosis not present

## 2023-12-03 DIAGNOSIS — R55 Syncope and collapse: Secondary | ICD-10-CM | POA: Diagnosis not present

## 2023-12-03 DIAGNOSIS — B9620 Unspecified Escherichia coli [E. coli] as the cause of diseases classified elsewhere: Secondary | ICD-10-CM | POA: Diagnosis not present

## 2023-12-03 DIAGNOSIS — M4316 Spondylolisthesis, lumbar region: Secondary | ICD-10-CM | POA: Diagnosis not present

## 2023-12-03 DIAGNOSIS — K5909 Other constipation: Secondary | ICD-10-CM | POA: Diagnosis not present

## 2023-12-03 DIAGNOSIS — Z8542 Personal history of malignant neoplasm of other parts of uterus: Secondary | ICD-10-CM | POA: Diagnosis not present

## 2023-12-03 DIAGNOSIS — R9431 Abnormal electrocardiogram [ECG] [EKG]: Secondary | ICD-10-CM | POA: Diagnosis not present

## 2023-12-03 DIAGNOSIS — M4807 Spinal stenosis, lumbosacral region: Secondary | ICD-10-CM | POA: Diagnosis not present

## 2023-12-03 DIAGNOSIS — S52331A Displaced oblique fracture of shaft of right radius, initial encounter for closed fracture: Secondary | ICD-10-CM | POA: Diagnosis not present

## 2023-12-03 DIAGNOSIS — Z7901 Long term (current) use of anticoagulants: Secondary | ICD-10-CM | POA: Diagnosis not present

## 2023-12-03 DIAGNOSIS — D62 Acute posthemorrhagic anemia: Secondary | ICD-10-CM | POA: Diagnosis not present

## 2023-12-03 DIAGNOSIS — S52351A Displaced comminuted fracture of shaft of radius, right arm, initial encounter for closed fracture: Secondary | ICD-10-CM | POA: Diagnosis not present

## 2023-12-03 DIAGNOSIS — G629 Polyneuropathy, unspecified: Secondary | ICD-10-CM | POA: Diagnosis not present

## 2023-12-03 DIAGNOSIS — N390 Urinary tract infection, site not specified: Secondary | ICD-10-CM | POA: Diagnosis not present

## 2023-12-03 DIAGNOSIS — Z9884 Bariatric surgery status: Secondary | ICD-10-CM | POA: Diagnosis not present

## 2023-12-03 DIAGNOSIS — Z86718 Personal history of other venous thrombosis and embolism: Secondary | ICD-10-CM | POA: Diagnosis not present

## 2023-12-03 DIAGNOSIS — M5442 Lumbago with sciatica, left side: Secondary | ICD-10-CM | POA: Diagnosis not present

## 2023-12-03 DIAGNOSIS — S52341A Displaced spiral fracture of shaft of radius, right arm, initial encounter for closed fracture: Secondary | ICD-10-CM | POA: Diagnosis not present

## 2023-12-03 DIAGNOSIS — D6859 Other primary thrombophilia: Secondary | ICD-10-CM | POA: Diagnosis not present

## 2023-12-03 DIAGNOSIS — I2699 Other pulmonary embolism without acute cor pulmonale: Secondary | ICD-10-CM | POA: Diagnosis not present

## 2023-12-03 DIAGNOSIS — I700 Atherosclerosis of aorta: Secondary | ICD-10-CM | POA: Diagnosis not present

## 2023-12-03 DIAGNOSIS — W19XXXA Unspecified fall, initial encounter: Secondary | ICD-10-CM | POA: Diagnosis not present

## 2023-12-03 DIAGNOSIS — M5186 Other intervertebral disc disorders, lumbar region: Secondary | ICD-10-CM | POA: Diagnosis not present

## 2023-12-03 DIAGNOSIS — N179 Acute kidney failure, unspecified: Secondary | ICD-10-CM | POA: Diagnosis not present

## 2023-12-03 DIAGNOSIS — W1830XA Fall on same level, unspecified, initial encounter: Secondary | ICD-10-CM | POA: Diagnosis not present

## 2023-12-03 DIAGNOSIS — R531 Weakness: Secondary | ICD-10-CM | POA: Diagnosis not present

## 2023-12-03 DIAGNOSIS — M7981 Nontraumatic hematoma of soft tissue: Secondary | ICD-10-CM | POA: Diagnosis not present

## 2023-12-03 DIAGNOSIS — R7989 Other specified abnormal findings of blood chemistry: Secondary | ICD-10-CM | POA: Diagnosis not present

## 2023-12-03 DIAGNOSIS — Z86711 Personal history of pulmonary embolism: Secondary | ICD-10-CM | POA: Diagnosis not present

## 2023-12-03 DIAGNOSIS — S7012XA Contusion of left thigh, initial encounter: Secondary | ICD-10-CM | POA: Diagnosis not present

## 2023-12-03 DIAGNOSIS — I82532 Chronic embolism and thrombosis of left popliteal vein: Secondary | ICD-10-CM | POA: Diagnosis not present

## 2023-12-03 DIAGNOSIS — S8012XA Contusion of left lower leg, initial encounter: Secondary | ICD-10-CM | POA: Diagnosis not present

## 2023-12-03 DIAGNOSIS — S52501A Unspecified fracture of the lower end of right radius, initial encounter for closed fracture: Secondary | ICD-10-CM | POA: Diagnosis not present

## 2023-12-03 DIAGNOSIS — M1990 Unspecified osteoarthritis, unspecified site: Secondary | ICD-10-CM | POA: Diagnosis not present

## 2023-12-03 DIAGNOSIS — I959 Hypotension, unspecified: Secondary | ICD-10-CM | POA: Diagnosis not present

## 2023-12-03 DIAGNOSIS — R42 Dizziness and giddiness: Secondary | ICD-10-CM | POA: Diagnosis not present

## 2023-12-03 DIAGNOSIS — E039 Hypothyroidism, unspecified: Secondary | ICD-10-CM | POA: Diagnosis not present

## 2023-12-03 DIAGNOSIS — Y93K1 Activity, walking an animal: Secondary | ICD-10-CM | POA: Diagnosis not present

## 2023-12-04 DIAGNOSIS — N179 Acute kidney failure, unspecified: Secondary | ICD-10-CM | POA: Diagnosis not present

## 2023-12-04 DIAGNOSIS — D62 Acute posthemorrhagic anemia: Secondary | ICD-10-CM | POA: Diagnosis not present

## 2023-12-20 DIAGNOSIS — S52501A Unspecified fracture of the lower end of right radius, initial encounter for closed fracture: Secondary | ICD-10-CM | POA: Diagnosis not present

## 2023-12-20 NOTE — H&P (Signed)
 PREOPERATIVE H&P  Chief Complaint: FRACTURE RIGHT WRIST  HPI: Erin Vasquez is a 68 y.o. female who presents with a diagnosis of FRACTURE RIGHT WRIST. Symptoms are rated as moderate to severe, and have been worsening.  This is significantly impairing activities of daily living.  She has elected for surgical management.   Past Medical History:  Diagnosis Date   Anemia    during pregnancy   Anxiety    Arthritis    "all over" (07/07/2017)   Bilateral pulmonary embolism (HCC) 10/29/2015   "from my left ankle"   Childhood asthma    as child, nothing as adult   Chronic back pain    "all over" (07/07/2017)   DVT (deep venous thrombosis) (HCC) 10/29/2015   "from my left ankle"   GERD (gastroesophageal reflux disease)    tx. Tums   H/O hiatal hernia    "repaired w/gastric bypass OR" (07/07/2017)   History of esophageal dilatation    Hypertension    Hypothyroidism    Lupus anticoagulant disorder (HCC)    Dr. Clelia Croft    Neuromuscular disorder [facility])    L thigh - Numbness, nerve damage     Sacroiliac pain    "left thigh has been numb since 01/2016 after appendectomy" (07/07/2017)   Sleep apnea    07/07/2017 "bariatric dr says I don't need CPAP anymore" (07/07/2017)   Thyroid disease    Uterine cancer (HCC) 01/2014   Varicose veins    Ventral hernia    Vitamin D deficiency    Past Surgical History:  Procedure Laterality Date   ABDOMINAL HYSTERECTOMY  01/2014   "robotic"   APPENDECTOMY     ruptured 01/2016   BALLOON DILATION N/A 08/02/2013   Procedure: BALLOON DILATION;  Surgeon: Willis Modena, MD;  Location: [facility];  Service: Endoscopy;  Laterality: N/A;   BUNIONECTOMY WITH HAMMERTOE RECONSTRUCTION Left ~ 2005   COLONOSCOPY WITH PROPOFOL N/A 08/02/2013   Procedure: COLONOSCOPY WITH PROPOFOL;  Surgeon: Willis Modena, MD;  Location: [facility];  Service: Endoscopy;  Laterality: N/A;   DILATION AND CURETTAGE OF UTERUS  x2   S/P miscarriage   ESOPHAGOGASTRODUODENOSCOPY  (EGD) WITH ESOPHAGEAL DILATION  ~ 2003   ESOPHAGOGASTRODUODENOSCOPY (EGD) WITH PROPOFOL N/A 08/02/2013   Procedure: ESOPHAGOGASTRODUODENOSCOPY (EGD) WITH PROPOFOL;  Surgeon: Willis Modena, MD;  Location: [facility];  Service: Endoscopy;  Laterality: N/A;   HERNIA REPAIR  04/2017   [facility]   INSERTION OF MESH N/A 04/28/2017   Procedure: POSSIBLE INSERTION OF MESH;  Surgeon: Abigail Miyamoto, MD;  Location: [facility] OR;  Service: General;  Laterality: N/A;   JOINT REPLACEMENT     LAPAROSCOPIC APPENDECTOMY N/A 02/07/2016   Procedure: LAPAROSCOPIC APPENDECTOMY;  Surgeon: Abigail Miyamoto, MD;  Location: [facility] OR;  Service: General;  Laterality: N/A;   ROBOTIC ASSISTED TOTAL HYSTERECTOMY WITH BILATERAL SALPINGO OOPHERECTOMY  01/23/2014   uterine cancer   ROUX-EN-Y GASTRIC BYPASS  11/2016   [facility]   SHOULDER ARTHROSCOPY WITH DEBRIDEMENT AND BICEP TENDON REPAIR Left 09/11/2014   Procedure: SHOULDER ARTHROSCOPY WITH DEBRIDEMENT AND BICEP TENDON REPAIR;  Surgeon: Sheral Apley, MD;  Location: [facility];  Service: Orthopedics;  Laterality: Left;   SHOULDER ARTHROSCOPY WITH DISTAL CLAVICLE RESECTION Left 09/11/2014   Procedure: SHOULDER ARTHROSCOPY WITH DISTAL CLAVICLE RESECTION;  Surgeon: Sheral Apley, MD;  Location: [facility];  Service: Orthopedics;  Laterality: Left;   SHOULDER ARTHROSCOPY WITH SUBACROMIAL DECOMPRESSION Left 09/11/2014   Procedure: SHOULDER ARTHROSCOPY WITH SUBACROMIAL DECOMPRESSION;  Surgeon: Sheral Apley, MD;  Location: [facility];  Service: Orthopedics;  Laterality: Left;   TONSILLECTOMY     TOTAL KNEE ARTHROPLASTY Right 07/06/2017   TOTAL KNEE ARTHROPLASTY Right 07/06/2017   Procedure: RIGHT TOTAL KNEE ARTHROPLASTY;  Surgeon: Sheral Apley, MD;  Location: [facility] OR;  Service: Orthopedics;  Laterality: Right;   TOTAL KNEE ARTHROPLASTY Left 04/19/2018   Procedure: LEFT TOTAL KNEE ARTHROPLASTY;  Surgeon: Sheral Apley, MD;  Location: [facility] OR;  Service: Orthopedics;  Laterality:  Left;   TOTAL SHOULDER ARTHROPLASTY Left 04/04/2019   Procedure: TOTAL SHOULDER ARTHROPLASTY reverse;  Surgeon: Sheral Apley, MD;  Location: [facility];  Service: Orthopedics;  Laterality: Left;   VENTRAL HERNIA REPAIR N/A 04/28/2017   Procedure: HERNIA REPAIR VENTRAL ADULT;  Surgeon: Abigail Miyamoto, MD;  Location: [facility] OR;  Service: General;  Laterality: N/A;   Social History   Socioeconomic History   Marital status: Divorced    Spouse name: Not on file   Number of children: Not on file   Years of education: Not on file   Highest education level: Not on file  Occupational History   Not on file  Tobacco Use   Smoking status: Never   Smokeless tobacco: Former    Types: Chew   Tobacco comments:    "chewed as a teen"  Vaping Use   Vaping status: Never Used  Substance and Sexual Activity   Alcohol use: Yes    Comment: 07/07/2017 "quit drinking by age 89; social drinker only"   Drug use: No   Sexual activity: Yes    Birth control/protection: None  Other Topics Concern   Not on file  Social History Narrative   Not on file   Social Drivers of Health   Financial Resource Strain: Medium Risk (07/05/2023)   Received from [facility]   Overall Financial Resource Strain (CARDIA)    Difficulty of Paying Living Expenses: Somewhat hard  Food Insecurity: No Food Insecurity (07/05/2023)   Received from [facility]   Hunger Vital Sign    Worried About Running Out of Food in the Last Year: Never true    Ran Out of Food in the Last Year: Never true  Transportation Needs: No Transportation Needs (07/05/2023)   Received from [facility] - Transportation    Lack of Transportation (Medical): No    Lack of Transportation (Non-Medical): No  Physical Activity: Sufficiently Active (07/05/2023)   Received from [facility]   Exercise Vital Sign    Days of Exercise per Week: 5 days    Minutes of Exercise per Session: 30 min  Recent Concern: Physical Activity -  Insufficiently Active (06/16/2023)   Received from [facility]   Exercise Vital Sign    Days of Exercise per Week: 4 days    Minutes of Exercise per Session: 30 min  Stress: No Stress Concern Present (07/05/2023)   Received from [facility] - Occupational Stress Questionnaire    Feeling of Stress : Only a little  Social Connections: Socially Integrated (07/05/2023)   Received from [facility]   Social Network    How would you rate your social network (family, work, friends)?: Good participation with social networks   Family History  Problem Relation Age of Onset   Transient ischemic attack Mother    Heart attack Father    Diabetes type II Other    Lung cancer Maternal Grandmother    Allergies  Allergen Reactions   Bee Venom Swelling,  Rash and Other (See Comments)    Patient is allergic to bee venom, mosquitos, wasps, yellow jackets and bees. Localized swelling Fever     Hymenoptera Venom Preparations Rash and Other (See Comments)    Patient is allergic to bee venom, mosquitos, wasps, yellow jackets and bees. Localized swelling Fever   Other Anaphylaxis, Shortness Of Breath, Itching and Rash    HICKORY SMOKE    Penicillins Anaphylaxis and Other (See Comments)    Did it involve swelling of the face/tongue/throat, SOB, or low BP? Yes Did it involve sudden or severe rash/hives, skin peeling, or any reaction on the inside of your mouth or nose? No Did you need to seek medical attention at a hospital or doctor's office? Yes When did it last happen?      childhood allergy If all above answers are "NO", may proceed with cephalosporin use.    Latex Rash    Latex rash due to elastic in zip-up TED hose at home (local rash), pt. Has had flu vaccine before without problems.   Morphine And Codeine Itching and Swelling   Plastibase Rash   Tangerine Flavoring Agent (Non-Screening) Rash   Prior to Admission medications   Medication Sig  Start Date End Date Taking? Authorizing Provider  amLODipine (NORVASC) 2.5 MG tablet Take 2.5 mg by mouth at bedtime.  12/02/18   [provider]  baclofen (LIORESAL) 10 MG tablet Take 1 tablet (10 mg total) by mouth 2 (two) times daily as needed for muscle spasms. 04/04/19   Albina Billet III, PA-C  Calcium Carbonate-Vitamin D (CALTRATE 600+D PO) Take 1 tablet by mouth daily.    [provider]  Cholecalciferol (VITAMIN D) 50 MCG (2000 UT) tablet Take 2,000 Units by mouth daily.    [provider]  clindamycin (CLEOCIN) 300 MG capsule  09/29/19   [provider]  diclofenac Sodium (VOLTAREN) 1 % GEL  07/13/18   [provider]  famotidine (PEPCID) 20 MG tablet Take 20 mg by mouth daily.  09/01/18   [provider]  fluticasone (CUTIVATE) 0.05 % cream Apply 1 application topically 2 (two) times daily as needed (rash).    [provider]  gabapentin (NEURONTIN) 300 MG capsule Take 1 capsule (300 mg total) by mouth 2 (two) times daily as needed for up to 15 days. For 2 weeks post op for pain. 04/04/19 04/19/19  Albina Billet III, PA-C  levothyroxine (SYNTHROID, LEVOTHROID) 50 MCG tablet Take 50 mcg by mouth daily before breakfast.     [provider]  methylPREDNISolone acetate (DEPO-MEDROL) 40 MG/ML injection SMARTSIG:2 Milliliter(s) IM Once PRN 07/19/19   [provider]  Multiple Vitamin (MULTIVITAMIN WITH MINERALS) TABS tablet Take 1 tablet by mouth 2 (two) times daily.     [provider]  omeprazole (PRILOSEC) 40 MG capsule Take 40 mg by mouth every evening.     [provider]  ondansetron (ZOFRAN) 4 MG tablet Take 1 tablet (4 mg total) by mouth every 8 (eight) hours as needed for nausea or vomiting. 04/04/19   Albina Billet III, PA-C  ondansetron (ZOFRAN) 4 MG tablet Take 1 tablet (4 mg total) by mouth every 8 (eight) hours as needed for nausea or vomiting. 02/02/23   Louann Sjogren, DPM  pseudoephedrine (SUDAFED) 120 MG 12 hr tablet Take 120 mg by mouth every 12 (twelve) hours as needed for congestion.    [provider]  sulfamethoxazole-trimethoprim (BACTRIM DS) 800-160 MG tablet Take 1 tablet  by mouth 2 (two) times daily. 10/23/19   [provider]  triamcinolone ointment (KENALOG) 0.5 % Apply 1 application topically 2 (two) times daily as needed (rash).    [provider]     Positive ROS: All other systems have been reviewed and were otherwise negative with the exception of those mentioned in the HPI and as above.  Physical Exam: General: Alert, no acute distress Cardiovascular: No pedal edema Respiratory: No cyanosis, no use of accessory musculature GI: No organomegaly, abdomen is soft and non-tender Skin: No lesions in the area of chief complaint Neurologic: Sensation intact distally Psychiatric: Patient is competent for consent with normal mood and affect Lymphatic: No axillary or cervical lymphadenopathy  MUSCULOSKELETAL: TTP right wrist, limited ROM tolerated, edema present, can wiggle fingers, NVI   Imaging: xrays show a distal radial shaft fracture proximal to the radial styloid with mild volar angulation   Assessment: FRACTURE RIGHT WRIST  Plan: Plan for Procedure(s): OPEN REDUCTION INTERNAL FIXATION (ORIF) DISTAL RADIUS FRACTURE  The risks benefits and alternatives were discussed with the patient including but not limited to the risks of nonoperative treatment, versus surgical intervention including infection, bleeding, nerve injury,  blood clots, cardiopulmonary complications, morbidity, mortality, among others, and they were willing to proceed.   Weightbearing: NWB RUE Orthopedic devices: splint and sling Showering: POD 3 Dressing: reinforce PRN Medicines: ASA, Norco, Mobic, Zofran  Discharge: home Follow up: 01/07/24 at 2pm    Jenne Pane, PA-C Office [phone] 12/20/2023 1:20 PM

## 2023-12-21 DIAGNOSIS — Z86718 Personal history of other venous thrombosis and embolism: Secondary | ICD-10-CM | POA: Diagnosis not present

## 2023-12-21 DIAGNOSIS — S52501S Unspecified fracture of the lower end of right radius, sequela: Secondary | ICD-10-CM | POA: Diagnosis not present

## 2023-12-21 DIAGNOSIS — D62 Acute posthemorrhagic anemia: Secondary | ICD-10-CM | POA: Diagnosis not present

## 2023-12-21 NOTE — Progress Notes (Signed)
 Anesthesia Review:  PCP: Delbert Harness LOV 12/21/23.  Cardiologist : none   PPM/ ICD: Device Orders: Rep Notified:  Chest x-ray : EKG : Echo : Stress test: Cardiac Cath :   Activity level:  Sleep Study/ CPAP : Fasting Blood Sugar :      / Checks Blood Sugar -- times a day:    Blood Thinner/ Instructions /Last Dose: ASA / Instructions/ Last Dose :  Warfarin  Lovenox    Hematology appt on 12/24/22 per note on 12/21/23.    12/16/23- cbc/diff 12/21/23- 11.1 in epic  12/13/23- BMP  12/03/23- ED Anemia  11/30/23- Left hip pain    Latex Allergy    Called pt at 1458pm  on 12/22/23. and LVMM and aSked for call back  Called pt on 12/22/23 at 1515pm and LVMM and asked for call back.  Called pt on 12/22/23 at 1532pm  and LVMM and asked for call back,

## 2023-12-21 NOTE — Patient Instructions (Signed)
 SURGICAL WAITING ROOM VISITATION  Patients having surgery or a procedure may have no more than 2 support people in the waiting area - these visitors may rotate.    Children under the age of 50 must have an adult with them who is not the patient.  Due to an increase in RSV and influenza rates and associated hospitalizations, children ages 26 and under may not visit patients in [facility] hospitals.  Visitors with respiratory illnesses are discouraged from visiting and should remain at home.  If the patient needs to stay at the hospital during part of their recovery, the visitor guidelines for inpatient rooms apply. Pre-op nurse will coordinate an appropriate time for 1 support person to accompany patient in pre-op.  This support person may not rotate.    Please refer to the [facility] website for the visitor guidelines for Inpatients (after your surgery is over and you are in a regular room).       Your procedure is scheduled on:  12/28/2023    Report to [facility] Main Entrance    Report to admitting at   0730AM   Call this number if you have problems the morning of surgery [phone]   Do not eat food :After Midnight.   After Midnight you may have the following liquids until __ 0630____ AM  DAY OF SURGERY  Water Non-Citrus Juices (without pulp, NO RED-Apple, White grape, White cranberry) Black Coffee (NO MILK/CREAM OR CREAMERS, sugar ok)  Clear Tea (NO MILK/CREAM OR CREAMERS, sugar ok) regular and decaf                             Plain Jell-O (NO RED)                                           Fruit ices (not with fruit pulp, NO RED)                                     Popsicles (NO RED)                                                               Sports drinks like Gatorade (NO RED)                          If you have questions, please contact your surgeon's office.       Oral Hygiene is also important to reduce your risk of infection.                                     Remember - BRUSH YOUR TEETH THE MORNING OF SURGERY WITH YOUR REGULAR TOOTHPASTE  DENTURES WILL BE REMOVED PRIOR TO SURGERY PLEASE DO NOT APPLY "Poly grip" OR ADHESIVES!!!   Do NOT smoke after Midnight   Stop all vitamins and herbal supplements 7 days before surgery.   Take these medicines the morning of surgery with A  SIP OF WATER:  pepcid, synthroid  DO NOT TAKE ANY ORAL DIABETIC MEDICATIONS DAY OF YOUR SURGERY  Bring CPAP mask and tubing day of surgery.                              You may not have any metal on your body including hair pins, jewelry, and body piercing             Do not wear make-up, lotions, powders, perfumes/cologne, or deodorant  Do not wear nail polish including gel and S&S, artificial/acrylic nails, or any other type of covering on natural nails including finger and toenails. If you have artificial nails, gel coating, etc. that needs to be removed by a nail salon please have this removed prior to surgery or surgery may need to be canceled/ delayed if the surgeon/ anesthesia feels like they are unable to be safely monitored.   Do not shave  48 hours prior to surgery.               Men may shave face and neck.   Do not bring valuables to the hospital. [facility] IS NOT             RESPONSIBLE   FOR VALUABLES.   Contacts, glasses, dentures or bridgework may not be worn into surgery.   Bring small overnight bag day of surgery.   DO NOT BRING YOUR HOME MEDICATIONS TO THE HOSPITAL. PHARMACY WILL DISPENSE MEDICATIONS LISTED ON YOUR MEDICATION LIST TO YOU DURING YOUR ADMISSION IN THE HOSPITAL!    Patients discharged on the day of surgery will not be allowed to drive home.  Someone NEEDS to stay with you for the first 24 hours after anesthesia.   Special Instructions: Bring a copy of your healthcare power of attorney and living will documents the day of surgery if you haven't scanned them before.              Please read over the following fact  sheets you were given: IF YOU HAVE QUESTIONS ABOUT YOUR PRE-OP INSTRUCTIONS PLEASE CALL [phone]   If you received a COVID test during your pre-op visit  it is requested that you wear a mask when out in public, stay away from anyone that may not be feeling well and notify your surgeon if you develop symptoms. If you test positive for Covid or have been in contact with anyone that has tested positive in the last 10 days please notify you surgeon.     - Preparing for Surgery Before surgery, you can play an important role.  Because skin is not sterile, your skin needs to be as free of germs as possible.  You can reduce the number of germs on your skin by washing with CHG (chlorahexidine gluconate) soap before surgery.  CHG is an antiseptic cleaner which kills germs and bonds with the skin to continue killing germs even after washing. Please DO NOT use if you have an allergy to CHG or antibacterial soaps.  If your skin becomes reddened/irritated stop using the CHG and inform your nurse when you arrive at Short Stay. Do not shave (including legs and underarms) for at least 48 hours prior to the first CHG shower.  You may shave your face/neck. Please follow these instructions carefully:  1.  Shower with CHG Soap the night before surgery and the  morning of Surgery.  2.  If you choose to wash your  hair, wash your hair first as usual with your  normal  shampoo.  3.  After you shampoo, rinse your hair and body thoroughly to remove the  shampoo.                           4.  Use CHG as you would any other liquid soap.  You can apply chg directly  to the skin and wash                       Gently with a scrungie or clean washcloth.  5.  Apply the CHG Soap to your body ONLY FROM THE NECK DOWN.   Do not use on face/ open                           Wound or open sores. Avoid contact with eyes, ears mouth and genitals (private parts).                       Wash face,  Genitals (private parts) with  your normal soap.             6.  Wash thoroughly, paying special attention to the area where your surgery  will be performed.  7.  Thoroughly rinse your body with warm water from the neck down.  8.  DO NOT shower/wash with your normal soap after using and rinsing off  the CHG Soap.                9.  Pat yourself dry with a clean towel.            10.  Wear clean pajamas.            11.  Place clean sheets on your bed the night of your first shower and do not  sleep with pets. Day of Surgery : Do not apply any lotions/deodorants the morning of surgery.  Please wear clean clothes to the hospital/surgery center.  FAILURE TO FOLLOW THESE INSTRUCTIONS MAY RESULT IN THE CANCELLATION OF YOUR SURGERY PATIENT SIGNATURE_________________________________  NURSE SIGNATURE__________________________________  ________________________________________________________________________

## 2023-12-22 ENCOUNTER — Encounter (HOSPITAL_COMMUNITY): Payer: Self-pay | Admitting: Orthopedic Surgery

## 2023-12-22 ENCOUNTER — Encounter (HOSPITAL_COMMUNITY)
Admission: RE | Admit: 2023-12-22 | Discharge: 2023-12-22 | Disposition: A | Discharge status: Home / Self Care | Source: Ambulatory Visit | Attending: Orthopedic Surgery | Admitting: Orthopedic Surgery

## 2023-12-22 ENCOUNTER — Encounter (HOSPITAL_COMMUNITY)
Admission: RE | Admit: 2023-12-22 | Discharge: 2023-12-22 | Disposition: A | Discharge status: Home / Self Care | Source: Ambulatory Visit

## 2023-12-22 NOTE — Progress Notes (Addendum)
 Anesthesia Review:  PCP: Maximo Spar LOV 12/21/23  Cardiologist : none  Hematology- Corean Deutscher- LOV 12/24/23.  PPM/ ICD: Device Orders: Rep Notified:  Chest x-ray : EKG : to be done DOS  Echo : Stress test: Cardiac Cath :   Activity level: cannot do a flight fo stairs without difficulty  Sleep Study/ CPAP : no longer uses due to weight loss  Fasting Blood Sugar :      / Checks Blood Sugar -- times a day:     Blood Thinner/ Instructions /Last Dose: ASA / Instructions/ Last Dose : Warfarin- last dose on 12/21/23  LOVenox- PT takes bid has received no instructons in regards to surgery- PT to call MD on 12/23/23 ( DR Abigail Abler and whoever prescribes for her) to obtain preop instructions.   PT voiced understandiing   In hospital on 12/09/23- admitted with anemia.  Received 7 units of blood per pt .   4/3/250 CBC- hgb- 8.2 12/13/23- BMP  11/30/23- In ED with left hip pain  DVT left lower leg Hx of bilateral PE  NO htn in years per pt    PT called back to PST on 12/22/2023 at 1646pm and LVMM .  Called pt at 1655 pm when saw message left and called pt back to obtain med hx and review preop instructoins.  MEd hx and preop instructions completed with pt.  PT aware to take shower nite before and am of procedure.  PT is allergic to Dial soap so she will use body wash nite before and am of surgery.  PST aware to place new sheets on bed and new pajamas and clean clothers day of surgery.     PT states her preop instructions from office state for her to be NPO after midnite.  Orders in epic state ERAS .  PT aware preop nurse to call DR Abigail Abler office and get clarification and preop nurse to call her back on 12/23/2023.     Called surgery scheduler on 4/10/235 t verify whether or not pt to be ERAS with liquids 3 hours prior or npo.  Loetta Ringer, Scheduler to go by orders.  Will call pt this pm and let pt be aware.     Spoke with pt on 12/23/2023 and she sees hematology on 12/24/2023 at 2pm.  She also has a call  into DR Abigail Abler as to preop instructions in regards to Lovenox.  Pt made aware that preop nurse spoke with DR Arvin Laundry in regards to Npo after midnite or Eras- liquids untl 3 hours before.  Scheduler instructed preop nurse to do liquids 3 hours before as per PA on preop orders.  REviewed with pt what liquids allowed untiol 0630am.  PT voiced understanding.     PT called in on 12/27/2023 in am and thought she was supposed to come in on 12/27/23 am for labs.  Charge Nurse, Gwen neal took call.  Preop nurse called pt and reviewed with her date and time of surgery and arrival time on 12/28/2023.  Reivewed with pt she is to take Synthroid am of surgery.  Reviewed with pt showers and remainder of preop instructons.  PT aware to arrive at 0615am.  PT voiced understanding. Asked pt about Hematology appt on 12/24/2023 . PT stated that DR Cornelia Dieter in [place] " did not think she should have surgery " and instructed her to stop Warfarin which she had done on 12/20/2023 per pt  and to continue Lovenox.  Instructed pt to call DR Abigail Abler  office and let them be aware of what DR Cornelia Dieter had stated.  PT stated she called them on Firday 12/24/23 after appt.     HGB- 11.1 on 12/24/23 routed to DR Abigail Abler on 12/24/23.    CBC done 12/24/23- in epic.  Hgb- 10.8

## 2023-12-23 NOTE — Anesthesia Preprocedure Evaluation (Addendum)
 Anesthesia Evaluation  Patient identified by MRN, date of birth, ID band Patient awake    Reviewed: Allergy & Precautions, H&P , NPO status , Patient's Chart, lab work & pertinent test results  Airway Mallampati: II  TM Distance: >3 FB Neck ROM: Full    Dental no notable dental hx. (+) Teeth Intact, Dental Advisory Given   Pulmonary asthma , PE   Pulmonary exam normal breath sounds clear to auscultation       Cardiovascular Exercise Tolerance: Good hypertension, Pt. on medications + Peripheral Vascular Disease and + DVT   Rhythm:Regular Rate:Normal     Neuro/Psych  Headaches  Anxiety        GI/Hepatic Neg liver ROS, hiatal hernia,GERD  Medicated,,  Endo/Other  Hypothyroidism    Renal/GU negative Renal ROS  negative genitourinary   Musculoskeletal  (+) Arthritis ,    Abdominal   Peds  Hematology  (+) Blood dyscrasia, anemia   Anesthesia Other Findings   Reproductive/Obstetrics negative OB ROS                             Anesthesia Physical Anesthesia Plan  ASA: 3  Anesthesia Plan: MAC and Regional   Post-op Pain Management: Tylenol PO (pre-op)*   Induction: Intravenous  PONV Risk Score and Plan: 2 and Propofol infusion, Ondansetron and Dexamethasone  Airway Management Planned: Natural Airway and Simple Face Mask  Additional Equipment:   Intra-op Plan:   Post-operative Plan:   Informed Consent: I have reviewed the patients History and Physical, chart, labs and discussed the procedure including the risks, benefits and alternatives for the proposed anesthesia with the patient or authorized representative who has indicated his/her understanding and acceptance.     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  Recent admission at [facility] 3/21 through 12/16/2023 for syncopal episode related to acute blood loss anemia/left thigh hematoma.  Per discharge summary, "Patient was admitted with acute blood loss anemia and thigh hematoma in the setting of being on treatment dose enoxaparin. Patient required several transfusions of blood. After discussion with hematology was defied decided that the safest would be to do a heparin drip and transition to warfarin as patient is failed Lovenox, DOAC's as outpatient. Because of left thigh hematoma patient was evaluated with PT and felt that patient would be best with home health PT. #) Recurrent VTE: In the setting of recurrent failed DOAC's and enoxaparin now. Likely in the setting of poor absorption because of Roux-en-Y gastric bypass. Transition to oral warfarin and will have outpatient follow-up to monitor warfarin levels. #) Right upper extremity fracture: Patient had prior fracture and was followed by orthopedic surgery for consideration of repair. Patient had splint changed out. Patient will have outpatient follow-up with Dr. Eulah Pont for consideration of repair. #) Hypothyroidism: Patient was continued on levothyroxine. #) AKI: Patient had some AKI that resolved which was presumed secondary to acute blood loss anemia. #) UTI/Foley catheter: Patient had Foley catheter that was removed and is voiding spontaneously. Patient was noted to have symptoms of a UTI on discharge her urine culture grew out E. coli. She was discharged on 3  days of Bactrim."    Hemoglobin was 11.1 on 12/21/2023 at PCP follow-up.  Per note, patient discontinued warfarin on 12/21/2023 for preparation for surgery and was continued on Lovenox.  EKG 12/03/2023 (Care  Everywhere): Sinus tachycardia.  Rate 129.  Nonspecific ST abnormality: Artifact at baseline.  Abnormal QRST angle, consider primary T wave abnormality.  )        Anesthesia Quick Evaluation

## 2023-12-23 NOTE — Progress Notes (Signed)
 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  Recent admission at [facility] 3/21 through 12/16/2023 for syncopal episode related to acute blood loss anemia/left thigh hematoma.  Per discharge summary, "Patient was admitted with acute blood loss anemia and thigh hematoma in the setting of being on treatment dose enoxaparin. Patient required several transfusions of blood. After discussion with hematology was defied decided that the safest would be to do a heparin drip and transition to warfarin as patient is failed Lovenox, DOAC's as outpatient. Because of left thigh hematoma patient was evaluated with PT and felt that patient would be best with home health PT. #) Recurrent VTE: In the setting of recurrent failed DOAC's and enoxaparin now. Likely in the setting of poor absorption because of Roux-en-Y gastric bypass. Transition to oral warfarin and will have outpatient follow-up to monitor warfarin levels. #) Right upper extremity fracture: Patient had prior fracture and was followed by orthopedic surgery for consideration of repair. Patient had splint changed out. Patient will have outpatient follow-up with Dr. Eulah Pont for consideration of repair. #) Hypothyroidism: Patient was continued on levothyroxine. #) AKI: Patient had some AKI that resolved which was presumed secondary to acute blood loss anemia. #) UTI/Foley catheter: Patient had Foley catheter that was removed and is voiding spontaneously. Patient was noted to have symptoms of a UTI on discharge her urine culture grew out E. coli. She was discharged on 3 days of Bactrim."    Hemoglobin was 11.1 on 12/21/2023 at PCP follow-up.  Per  note, patient discontinued warfarin on 12/21/2023 for preparation for surgery and was continued on Lovenox.  EKG 12/03/2023 (Care Everywhere): Sinus tachycardia.  Rate 129.  Nonspecific ST abnormality: Artifact at baseline.  Abnormal QRST angle, consider primary T wave abnormality.    Zannie Cove [facility] Short Stay Center/Anesthesiology Phone [phone] 12/23/2023 11:06 AM

## 2023-12-24 ENCOUNTER — Encounter (HOSPITAL_COMMUNITY)

## 2023-12-24 DIAGNOSIS — Z86711 Personal history of pulmonary embolism: Secondary | ICD-10-CM | POA: Diagnosis not present

## 2023-12-24 DIAGNOSIS — S7011XS Contusion of right thigh, sequela: Secondary | ICD-10-CM | POA: Diagnosis not present

## 2023-12-24 DIAGNOSIS — Z7901 Long term (current) use of anticoagulants: Secondary | ICD-10-CM | POA: Diagnosis not present

## 2023-12-24 DIAGNOSIS — I82409 Acute embolism and thrombosis of unspecified deep veins of unspecified lower extremity: Secondary | ICD-10-CM | POA: Diagnosis not present

## 2023-12-24 DIAGNOSIS — D6861 Antiphospholipid syndrome: Secondary | ICD-10-CM | POA: Diagnosis not present

## 2023-12-24 DIAGNOSIS — D500 Iron deficiency anemia secondary to blood loss (chronic): Secondary | ICD-10-CM | POA: Diagnosis not present

## 2023-12-28 ENCOUNTER — Ambulatory Visit (HOSPITAL_BASED_OUTPATIENT_CLINIC_OR_DEPARTMENT_OTHER): Payer: Self-pay | Admitting: Anesthesiology

## 2023-12-28 ENCOUNTER — Encounter (HOSPITAL_COMMUNITY): Payer: Self-pay | Admitting: Orthopedic Surgery

## 2023-12-28 ENCOUNTER — Ambulatory Visit (HOSPITAL_COMMUNITY)
Admission: RE | Admit: 2023-12-28 | Discharge: 2023-12-28 | Disposition: A | Discharge status: Home / Self Care | Source: Ambulatory Visit | Attending: Orthopedic Surgery | Admitting: Orthopedic Surgery

## 2023-12-28 ENCOUNTER — Ambulatory Visit (HOSPITAL_COMMUNITY): Payer: Self-pay | Admitting: Physician Assistant

## 2023-12-28 ENCOUNTER — Encounter (HOSPITAL_COMMUNITY)
Admission: RE | Disposition: A | Discharge status: Home / Self Care | Payer: Self-pay | Source: Ambulatory Visit | Attending: Orthopedic Surgery

## 2023-12-28 DIAGNOSIS — X58XXXA Exposure to other specified factors, initial encounter: Secondary | ICD-10-CM | POA: Insufficient documentation

## 2023-12-28 DIAGNOSIS — Z86718 Personal history of other venous thrombosis and embolism: Secondary | ICD-10-CM | POA: Insufficient documentation

## 2023-12-28 DIAGNOSIS — I10 Essential (primary) hypertension: Secondary | ICD-10-CM | POA: Insufficient documentation

## 2023-12-28 DIAGNOSIS — F419 Anxiety disorder, unspecified: Secondary | ICD-10-CM | POA: Diagnosis not present

## 2023-12-28 DIAGNOSIS — S6291XA Unspecified fracture of right wrist and hand, initial encounter for closed fracture: Secondary | ICD-10-CM | POA: Diagnosis not present

## 2023-12-28 DIAGNOSIS — M1990 Unspecified osteoarthritis, unspecified site: Secondary | ICD-10-CM | POA: Insufficient documentation

## 2023-12-28 DIAGNOSIS — K219 Gastro-esophageal reflux disease without esophagitis: Secondary | ICD-10-CM | POA: Insufficient documentation

## 2023-12-28 DIAGNOSIS — D649 Anemia, unspecified: Secondary | ICD-10-CM | POA: Insufficient documentation

## 2023-12-28 DIAGNOSIS — Z86711 Personal history of pulmonary embolism: Secondary | ICD-10-CM | POA: Insufficient documentation

## 2023-12-28 DIAGNOSIS — R519 Headache, unspecified: Secondary | ICD-10-CM | POA: Insufficient documentation

## 2023-12-28 DIAGNOSIS — K449 Diaphragmatic hernia without obstruction or gangrene: Secondary | ICD-10-CM | POA: Insufficient documentation

## 2023-12-28 DIAGNOSIS — Z79899 Other long term (current) drug therapy: Secondary | ICD-10-CM | POA: Diagnosis not present

## 2023-12-28 DIAGNOSIS — S52501A Unspecified fracture of the lower end of right radius, initial encounter for closed fracture: Secondary | ICD-10-CM | POA: Diagnosis present

## 2023-12-28 DIAGNOSIS — Z01818 Encounter for other preprocedural examination: Secondary | ICD-10-CM

## 2023-12-28 DIAGNOSIS — E039 Hypothyroidism, unspecified: Secondary | ICD-10-CM

## 2023-12-28 DIAGNOSIS — J45909 Unspecified asthma, uncomplicated: Secondary | ICD-10-CM

## 2023-12-28 DIAGNOSIS — I739 Peripheral vascular disease, unspecified: Secondary | ICD-10-CM | POA: Diagnosis not present

## 2023-12-28 DIAGNOSIS — Z79890 Hormone replacement therapy (postmenopausal): Secondary | ICD-10-CM | POA: Diagnosis not present

## 2023-12-28 HISTORY — DX: Peripheral vascular disease, unspecified: I73.9

## 2023-12-28 HISTORY — PX: OPEN REDUCTION INTERNAL FIXATION (ORIF) DISTAL RADIAL FRACTURE: SHX5989

## 2023-12-28 LAB — CBC
HCT: 36.9 % (ref 36.0–46.0)
Hemoglobin: 11.2 g/dL — ABNORMAL LOW (ref 12.0–15.0)
MCH: 33.1 pg (ref 26.0–34.0)
MCHC: 30.4 g/dL (ref 30.0–36.0)
MCV: 109.2 fL — ABNORMAL HIGH (ref 80.0–100.0)
Platelets: 328 10*3/uL (ref 150–400)
RBC: 3.38 MIL/uL — ABNORMAL LOW (ref 3.87–5.11)
RDW: 18.7 % — ABNORMAL HIGH (ref 11.5–15.5)
WBC: 6.6 10*3/uL (ref 4.0–10.5)
nRBC: 0 % (ref 0.0–0.2)

## 2023-12-28 LAB — BASIC METABOLIC PANEL WITH GFR
Anion gap: 9 (ref 5–15)
BUN: 19 mg/dL (ref 8–23)
CO2: 24 mmol/L (ref 22–32)
Calcium: 8.8 mg/dL — ABNORMAL LOW (ref 8.9–10.3)
Chloride: 109 mmol/L (ref 98–111)
Creatinine, Ser: 0.61 mg/dL (ref 0.44–1.00)
GFR, Estimated: >60 mL/min (ref >60)
Glucose, Bld: 87 mg/dL (ref 70–99)
Potassium: 4.1 mmol/L (ref 3.5–5.1)
Sodium: 142 mmol/L (ref 135–145)

## 2023-12-28 LAB — PROTIME-INR
INR: 1.2 (ref 0.8–1.2)
Prothrombin Time: 15.7 s — ABNORMAL HIGH (ref 11.4–15.2)

## 2023-12-28 SURGERY — OPEN REDUCTION INTERNAL FIXATION (ORIF) DISTAL RADIUS FRACTURE
Anesthesia: Monitor Anesthesia Care | Laterality: Right

## 2023-12-28 MED ORDER — ONDANSETRON HCL 4 MG/2ML IJ SOLN
INTRAMUSCULAR | Status: AC
  Filled 2023-12-28: qty 2

## 2023-12-28 MED ORDER — ORAL CARE MOUTH RINSE: 15.0000 mL | Freq: Once | OROMUCOSAL | Status: AC

## 2023-12-28 MED ORDER — CLINDAMYCIN PHOSPHATE 900 MG/50ML IV SOLN
INTRAVENOUS | Status: AC
  Filled 2023-12-28: qty 50

## 2023-12-28 MED ORDER — ONDANSETRON HCL 4 MG/2ML IJ SOLN
INTRAMUSCULAR | Status: DC | PRN
  Administered 2023-12-28: 4 mg via INTRAVENOUS

## 2023-12-28 MED ORDER — FENTANYL CITRATE PF 50 MCG/ML IJ SOSY: 25.0000 ug | PREFILLED_SYRINGE | INTRAMUSCULAR | Status: DC | PRN

## 2023-12-28 MED ORDER — FENTANYL CITRATE (PF) 100 MCG/2ML IJ SOLN
INTRAMUSCULAR | Status: AC
  Filled 2023-12-28: qty 2

## 2023-12-28 MED ORDER — HYDROCODONE-ACETAMINOPHEN 10-325 MG PO TABS: 1.0000 | ORAL_TABLET | Freq: Four times a day (QID) | ORAL | 0 refills | Status: DC | PRN

## 2023-12-28 MED ORDER — LIDOCAINE HCL (PF) 2 % IJ SOLN
INTRAMUSCULAR | Status: AC
  Filled 2023-12-28: qty 5

## 2023-12-28 MED ORDER — PROPOFOL 10 MG/ML IV BOLUS
INTRAVENOUS | Status: AC
  Filled 2023-12-28: qty 20

## 2023-12-28 MED ORDER — LACTATED RINGERS IV SOLN: INTRAVENOUS | Status: DC

## 2023-12-28 MED ORDER — MIDAZOLAM HCL 2 MG/2ML IJ SOLN
1.0000 mg | Freq: Once | INTRAMUSCULAR | Status: AC
  Administered 2023-12-28: 1 mg via INTRAVENOUS

## 2023-12-28 MED ORDER — SODIUM CHLORIDE 0.9 % IR SOLN
Status: DC | PRN
  Administered 2023-12-28: 1000 mL

## 2023-12-28 MED ORDER — FENTANYL CITRATE PF 50 MCG/ML IJ SOSY: 50.0000 ug | PREFILLED_SYRINGE | Freq: Once | INTRAMUSCULAR | Status: DC

## 2023-12-28 MED ORDER — FENTANYL CITRATE (PF) 100 MCG/2ML IJ SOLN
INTRAMUSCULAR | Status: DC | PRN
  Administered 2023-12-28: 50 ug via INTRAVENOUS

## 2023-12-28 MED ORDER — LIDOCAINE HCL (PF) 2 % IJ SOLN
INTRAMUSCULAR | Status: DC | PRN
  Administered 2023-12-28: 30 mg via INTRADERMAL

## 2023-12-28 MED ORDER — CHLORHEXIDINE GLUCONATE 0.12 % MT SOLN
15.0000 mL | Freq: Once | OROMUCOSAL | Status: AC
  Administered 2023-12-28: 15 mL via OROMUCOSAL

## 2023-12-28 MED ORDER — PROPOFOL 10 MG/ML IV BOLUS
INTRAVENOUS | Status: DC | PRN
Start: 2023-12-28 — End: 2023-12-28
  Administered 2023-12-28: 50 ug/kg/min via INTRAVENOUS
  Administered 2023-12-28: 30 mg via INTRAVENOUS
  Administered 2023-12-28 (×2): 20 mg via INTRAVENOUS
  Administered 2023-12-28: 30 mg via INTRAVENOUS

## 2023-12-28 MED ORDER — DEXAMETHASONE SODIUM PHOSPHATE 10 MG/ML IJ SOLN
INTRAMUSCULAR | Status: AC
  Filled 2023-12-28: qty 1

## 2023-12-28 MED ORDER — DEXAMETHASONE SODIUM PHOSPHATE 10 MG/ML IJ SOLN
INTRAMUSCULAR | Status: DC | PRN
  Administered 2023-12-28: 4 mg via INTRAVENOUS

## 2023-12-28 MED ORDER — BUPIVACAINE-EPINEPHRINE (PF) 0.5% -1:200000 IJ SOLN
INTRAMUSCULAR | Status: DC | PRN
  Administered 2023-12-28: 30 mL via PERINEURAL

## 2023-12-28 MED ORDER — ONDANSETRON 4 MG PO TBDP
4.0000 mg | ORAL_TABLET | Freq: Three times a day (TID) | ORAL | 0 refills | Status: AC | PRN
Start: 2023-12-28 — End: ?

## 2023-12-28 MED ORDER — ACETAMINOPHEN 500 MG PO TABS
1000.0000 mg | ORAL_TABLET | Freq: Once | ORAL | Status: DC
Start: 2023-12-28 — End: 2023-12-28

## 2023-12-28 MED ORDER — CEFAZOLIN SODIUM-DEXTROSE 2-4 GM/100ML-% IV SOLN
2.0000 g | INTRAVENOUS | Status: AC
  Administered 2023-12-28: 2 g via INTRAVENOUS
  Filled 2023-12-28: qty 100

## 2023-12-28 MED ORDER — MELOXICAM 15 MG PO TABS: 15.0000 mg | ORAL_TABLET | Freq: Every day | ORAL | 0 refills | Status: DC | PRN

## 2023-12-28 MED ORDER — POVIDONE-IODINE 10 % EX SWAB
2.0000 | Freq: Once | CUTANEOUS | Status: AC
  Administered 2023-12-28: 2 via TOPICAL

## 2023-12-28 MED ORDER — ACETAMINOPHEN 500 MG PO TABS
1000.0000 mg | ORAL_TABLET | Freq: Once | ORAL | Status: AC
  Administered 2023-12-28: 1000 mg via ORAL
  Filled 2023-12-28: qty 2

## 2023-12-28 MED ORDER — MIDAZOLAM HCL 2 MG/2ML IJ SOLN
INTRAMUSCULAR | Status: AC
  Filled 2023-12-28: qty 2

## 2023-12-28 MED ORDER — PHENYLEPHRINE 80 MCG/ML (10ML) SYRINGE FOR IV PUSH (FOR BLOOD PRESSURE SUPPORT)
PREFILLED_SYRINGE | INTRAVENOUS | Status: DC | PRN
  Administered 2023-12-28: 160 ug via INTRAVENOUS
  Administered 2023-12-28: 80 ug via INTRAVENOUS
  Administered 2023-12-28: 160 ug via INTRAVENOUS
  Administered 2023-12-28: 80 ug via INTRAVENOUS

## 2023-12-28 SURGICAL SUPPLY — 42 items
BIT DRILL 2.2 SS TIBIAL (BIT) IMPLANT
BNDG ELASTIC 3INX 5YD STR LF (GAUZE/BANDAGES/DRESSINGS) IMPLANT
BNDG ELASTIC 4INX 5YD STR LF (GAUZE/BANDAGES/DRESSINGS) IMPLANT
BNDG ESMARK 4X9 LF (GAUZE/BANDAGES/DRESSINGS) ×1 IMPLANT
CORD BIPOLAR FORCEPS 12FT (ELECTRODE) ×1 IMPLANT
COVER SURGICAL LIGHT HANDLE (MISCELLANEOUS) ×1 IMPLANT
CUFF TOURN SGL QUICK 18X4 (TOURNIQUET CUFF) ×1 IMPLANT
DRAPE IMP U-DRAPE 54X76 (DRAPES) ×2 IMPLANT
DRAPE OEC MINIVIEW 54X84 (DRAPES) ×1 IMPLANT
DRSG XEROFORM 1X8 (GAUZE/BANDAGES/DRESSINGS) IMPLANT
DURAPREP 26ML APPLICATOR (WOUND CARE) ×1 IMPLANT
ELECT REM PT RETURN 15FT ADLT (MISCELLANEOUS) ×1 IMPLANT
GAUZE PAD ABD 8X10 STRL (GAUZE/BANDAGES/DRESSINGS) IMPLANT
GAUZE SPONGE 4X4 12PLY STRL (GAUZE/BANDAGES/DRESSINGS) IMPLANT
GLOVE BIOGEL PI IND STRL 7.5 (GLOVE) ×1 IMPLANT
GLOVE BIOGEL PI IND STRL 8 (GLOVE) ×1 IMPLANT
GLOVE PI ORTHO PRO STRL 7.5 (GLOVE) ×1 IMPLANT
GOWN STRL REUS W/ TWL LRG LVL3 (GOWN DISPOSABLE) ×1 IMPLANT
K-WIRE FX5X1.6XNS BN SS (WIRE) ×3 IMPLANT
KIT BASIN OR (CUSTOM PROCEDURE TRAY) ×1 IMPLANT
KWIRE FX5X1.6XNS BN SS (WIRE) IMPLANT
NS IRRIG 1000ML POUR BTL (IV SOLUTION) ×1 IMPLANT
PACK ORTHO EXTREMITY (CUSTOM PROCEDURE TRAY) ×1 IMPLANT
PAD CAST 3X4 CTTN HI CHSV (CAST SUPPLIES) IMPLANT
PAD CAST 4YDX4 CTTN HI CHSV (CAST SUPPLIES) IMPLANT
PEG LOCKING SMOOTH 2.2X16 (Screw) IMPLANT
PEG LOCKING SMOOTH 2.2X18 (Peg) IMPLANT
PLATE LONG DVR RIGHT (Plate) IMPLANT
SCREW LOCK 14X2.7X 3 LD TPR (Screw) IMPLANT
SCREW LOCK 20X2.7X 3 LD TPR (Screw) IMPLANT
SCREW LOCKING 2.7X15MM (Screw) IMPLANT
SLING ARM FOAM STRAP MED (SOFTGOODS) IMPLANT
SPLINT PLASTER CAST FAST 5X30 (CAST SUPPLIES) IMPLANT
STRIP CLOSURE SKIN 1/2X4 (GAUZE/BANDAGES/DRESSINGS) IMPLANT
SUT ETHILON 3 0 PS 1 (SUTURE) IMPLANT
SUT ETHILON 4 0 PS 2 18 (SUTURE) ×2 IMPLANT
SUT MNCRL AB 4-0 PS2 18 (SUTURE) ×1 IMPLANT
SUT VIC AB 0 CT1 36 (SUTURE) ×1 IMPLANT
SUT VIC AB 2-0 CT1 TAPERPNT 27 (SUTURE) ×1 IMPLANT
TOWEL OR 17X26 10 PK STRL BLUE (TOWEL DISPOSABLE) ×2 IMPLANT
UNDERPAD 30X36 HEAVY ABSORB (UNDERPADS AND DIAPERS) ×1 IMPLANT
WATER STERILE IRR 1000ML POUR (IV SOLUTION) ×1 IMPLANT

## 2023-12-28 NOTE — Interval H&P Note (Signed)
 History and Physical Interval Note:  12/28/2023 8:22 AM  Erin Vasquez  has presented today for surgery, with the diagnosis of FRACTURE RIGHT WRIST.  The various methods of treatment have been discussed with the patient and family. After consideration of risks, benefits and other options for treatment, the patient has consented to  Procedure(s): OPEN REDUCTION INTERNAL FIXATION (ORIF) DISTAL RADIUS FRACTURE (Right) as a surgical intervention.  The patient's history has been reviewed, patient examined, no change in status, stable for surgery.  I have reviewed the patient's chart and labs.  Questions were answered to the patient's satisfaction.     Saundra Curl

## 2023-12-28 NOTE — Anesthesia Postprocedure Evaluation (Signed)
 Anesthesia Post Note  Patient: Erin Vasquez  Procedure(s) Performed: OPEN REDUCTION INTERNAL FIXATION (ORIF) DISTAL RADIUS FRACTURE (Right)     Patient location during evaluation: PACU Anesthesia Type: Regional and MAC Level of consciousness: awake and alert Pain management: pain level controlled Vital Signs Assessment: post-procedure vital signs reviewed and stable Respiratory status: spontaneous breathing, nonlabored ventilation and respiratory function stable Cardiovascular status: stable and blood pressure returned to baseline Postop Assessment: no apparent nausea or vomiting Anesthetic complications: no  No notable events documented.  Last Vitals:  Vitals:   12/28/23 1110 12/28/23 1129  BP: (!) 151/84 (!) 147/79  Pulse: (!) 53 (!) 57  Resp: 16 17  Temp: 36.6 C 37.1 C  SpO2: 98% 97%    Last Pain:  Vitals:   12/28/23 1129  TempSrc:   PainSc: 0-No pain                 Arienna Benegas,W. EDMOND

## 2023-12-28 NOTE — Transfer of Care (Signed)
 Immediate Anesthesia Transfer of Care Note  Patient: Erin Vasquez  Procedure(s) Performed: OPEN REDUCTION INTERNAL FIXATION (ORIF) DISTAL RADIUS FRACTURE (Right)  Patient Location: PACU  Anesthesia Type:MAC and Regional  Level of Consciousness: awake, alert , oriented, and patient cooperative  Airway & Oxygen Therapy: Patient Spontanous Breathing and Patient connected to face mask oxygen  Post-op Assessment: Report given to RN and Post -op Vital signs reviewed and stable  Post vital signs: Reviewed and stable  Last Vitals:  Vitals Value Taken Time  BP 129/63 12/28/23 1021  Temp    Pulse 58 12/28/23 1023  Resp 15 12/28/23 1023  SpO2 100 % 12/28/23 1023  Vitals shown include unfiled device data.  Last Pain:  Vitals:   12/28/23 0753  TempSrc:   PainSc: 7          Complications: No notable events documented.

## 2023-12-28 NOTE — Anesthesia Procedure Notes (Signed)
 Anesthesia Regional Block: Supraclavicular block   Pre-Anesthetic Checklist: , timeout performed,  Correct Patient, Correct Site, Correct Laterality,  Correct Procedure, Correct Position, site marked,  Risks and benefits discussed,  Pre-op evaluation,  At surgeon's request and post-op pain management  Laterality: Right  Prep: Maximum Sterile Barrier Precautions used, chloraprep       Needles:  Injection technique: Single-shot  Needle Type: Echogenic Stimulator Needle     Needle Length: 5cm  Needle Gauge: 22     Additional Needles:   Procedures:,,,, ultrasound used (permanent image in chart),,    Narrative:  Start time: 12/28/2023 8:08 AM End time: 12/28/2023 8:18 AM Injection made incrementally with aspirations every 5 mL.  Performed by: Personally  Anesthesiologist: Jake Mayers, MD

## 2023-12-28 NOTE — Op Note (Signed)
 12/28/2023  9:52 AM  PATIENT:  Erin Vasquez    PRE-OPERATIVE DIAGNOSIS:  FRACTURE RIGHT WRIST  POST-OPERATIVE DIAGNOSIS:  Same  PROCEDURE:  OPEN REDUCTION INTERNAL FIXATION (ORIF) DISTAL RADIUS FRACTURE  SURGEON:  Saundra Curl, MD  ASSISTANT: Marzella Solan, PA-C, he was present and scrubbed throughout the case, critical for completion in a timely fashion, and for retraction, instrumentation, and closure.   ANESTHESIA:   block  PREOPERATIVE INDICATIONS:  Erin Vasquez is a  68 y.o. female with a diagnosis of FRACTURE RIGHT WRIST who failed conservative measures and elected for surgical management.    The risks benefits and alternatives were discussed with the patient preoperatively including but not limited to the risks of infection, bleeding, nerve injury, cardiopulmonary complications, the need for revision surgery, among others, and the patient was willing to proceed.  OPERATIVE IMPLANTS: DVR plate  OPERATIVE FINDINGS: unstable fractrure  BLOOD LOSS: min  COMPLICATIONS: none  TOURNIQUET TIME:  OPERATIVE PROCEDURE:  Patient was identified in the preoperative holding area and site was marked by me She was transported to the operating theater and placed on the table in supine position taking care to pad all bony prominences. After a preincinduction time out anesthesia was induced. The right upper extremity was prepped and draped in normal sterile fashion and a pre-incision timeout was performed. She received ancef for preoperative antibiotics.   I made an incision centered over her FCR tendon and dissected down carefully to the level of the flexor tendon sheath and incise this longitudinally and retracted the FCR radially and incised the dorsal aspect of the sheath.   I bluntly dissected the FPL muscle belly away from the brachioradialis and then sharply incised the pronator tendon from the distal radius and from the wrist capsule. I Elevated this off the bone the  fractures visible.   I reduced the fracture and pinned with a K wire.  I selected a plate and I placed it on the bone. I pinned it into place and was happy on multiple radiographic views with it's placement. I then fixed the plate distally with the locking pegs. I confirmed no articular penetration with the pegs and that none were prominent dorsally.   I then reduced the plate to the shaft improving the volar and radial tilt of her distal radius.  I was happy with the final fluoro xrays. I reviewed more than 3 views of the wrist including obliques and ap/lat   I thoroughly irrigated the wound and closed the pronator over top of the plate and then closed the skin in layers with absorbable stitch. Sterile dressing was applied using the PACU in stable condition.  POST OPERATIVE PLAN: NWB, Splint full time. Ambulate for DVT px.

## 2023-12-28 NOTE — Discharge Instructions (Addendum)

## 2023-12-29 ENCOUNTER — Encounter (HOSPITAL_COMMUNITY): Payer: Self-pay | Admitting: Orthopedic Surgery

## 2024-01-05 DIAGNOSIS — Z903 Acquired absence of stomach [part of]: Secondary | ICD-10-CM | POA: Diagnosis not present

## 2024-01-05 DIAGNOSIS — Z7901 Long term (current) use of anticoagulants: Secondary | ICD-10-CM | POA: Diagnosis not present

## 2024-01-05 DIAGNOSIS — E559 Vitamin D deficiency, unspecified: Secondary | ICD-10-CM | POA: Diagnosis not present

## 2024-01-05 DIAGNOSIS — D6862 Lupus anticoagulant syndrome: Secondary | ICD-10-CM | POA: Diagnosis not present

## 2024-01-05 DIAGNOSIS — Z86718 Personal history of other venous thrombosis and embolism: Secondary | ICD-10-CM | POA: Diagnosis not present

## 2024-01-05 DIAGNOSIS — D6859 Other primary thrombophilia: Secondary | ICD-10-CM | POA: Diagnosis not present

## 2024-01-05 DIAGNOSIS — K912 Postsurgical malabsorption, not elsewhere classified: Secondary | ICD-10-CM | POA: Diagnosis not present

## 2024-01-12 DIAGNOSIS — Z6825 Body mass index (BMI) 25.0-25.9, adult: Secondary | ICD-10-CM | POA: Diagnosis not present

## 2024-01-12 DIAGNOSIS — Z9884 Bariatric surgery status: Secondary | ICD-10-CM | POA: Diagnosis not present

## 2024-01-12 DIAGNOSIS — M8580 Other specified disorders of bone density and structure, unspecified site: Secondary | ICD-10-CM | POA: Diagnosis not present

## 2024-01-14 ENCOUNTER — Other Ambulatory Visit: Payer: Self-pay

## 2024-01-14 ENCOUNTER — Encounter (HOSPITAL_COMMUNITY): Payer: Self-pay | Admitting: Orthopedic Surgery

## 2024-01-14 ENCOUNTER — Observation Stay (HOSPITAL_COMMUNITY)
Admission: RE | Admit: 2024-01-14 | Discharge: 2024-01-15 | Disposition: A | Discharge status: Home / Self Care | Source: Ambulatory Visit | Attending: Internal Medicine | Admitting: Internal Medicine

## 2024-01-14 ENCOUNTER — Encounter (HOSPITAL_COMMUNITY)
Admission: RE | Disposition: A | Discharge status: Home / Self Care | Payer: Self-pay | Source: Ambulatory Visit | Attending: Internal Medicine

## 2024-01-14 ENCOUNTER — Emergency Department (HOSPITAL_COMMUNITY)
Admission: EM | Admit: 2024-01-14 | Discharge: 2024-01-14 | Disposition: A | Discharge status: Home / Self Care | Attending: Emergency Medicine | Admitting: Emergency Medicine

## 2024-01-14 ENCOUNTER — Inpatient Hospital Stay (HOSPITAL_BASED_OUTPATIENT_CLINIC_OR_DEPARTMENT_OTHER): Admitting: Anesthesiology

## 2024-01-14 ENCOUNTER — Inpatient Hospital Stay (HOSPITAL_COMMUNITY): Admitting: Anesthesiology

## 2024-01-14 ENCOUNTER — Emergency Department (HOSPITAL_COMMUNITY)

## 2024-01-14 DIAGNOSIS — T79A11A Traumatic compartment syndrome of right upper extremity, initial encounter: Secondary | ICD-10-CM | POA: Diagnosis not present

## 2024-01-14 DIAGNOSIS — T148XXA Other injury of unspecified body region, initial encounter: Principal | ICD-10-CM | POA: Diagnosis present

## 2024-01-14 DIAGNOSIS — Z96612 Presence of left artificial shoulder joint: Secondary | ICD-10-CM | POA: Insufficient documentation

## 2024-01-14 DIAGNOSIS — Z91040 Latex allergy status: Secondary | ICD-10-CM | POA: Insufficient documentation

## 2024-01-14 DIAGNOSIS — R072 Precordial pain: Secondary | ICD-10-CM | POA: Diagnosis not present

## 2024-01-14 DIAGNOSIS — Z4789 Encounter for other orthopedic aftercare: Secondary | ICD-10-CM | POA: Insufficient documentation

## 2024-01-14 DIAGNOSIS — I10 Essential (primary) hypertension: Secondary | ICD-10-CM | POA: Diagnosis not present

## 2024-01-14 DIAGNOSIS — G5601 Carpal tunnel syndrome, right upper limb: Secondary | ICD-10-CM | POA: Insufficient documentation

## 2024-01-14 DIAGNOSIS — E039 Hypothyroidism, unspecified: Secondary | ICD-10-CM

## 2024-01-14 DIAGNOSIS — Z8542 Personal history of malignant neoplasm of other parts of uterus: Secondary | ICD-10-CM | POA: Insufficient documentation

## 2024-01-14 DIAGNOSIS — Z86718 Personal history of other venous thrombosis and embolism: Secondary | ICD-10-CM | POA: Diagnosis not present

## 2024-01-14 DIAGNOSIS — R079 Chest pain, unspecified: Secondary | ICD-10-CM | POA: Diagnosis not present

## 2024-01-14 DIAGNOSIS — R200 Anesthesia of skin: Secondary | ICD-10-CM | POA: Diagnosis present

## 2024-01-14 DIAGNOSIS — J45909 Unspecified asthma, uncomplicated: Secondary | ICD-10-CM

## 2024-01-14 DIAGNOSIS — M96841 Postprocedural hematoma of a musculoskeletal structure following other procedure: Secondary | ICD-10-CM | POA: Diagnosis present

## 2024-01-14 DIAGNOSIS — G5621 Lesion of ulnar nerve, right upper limb: Secondary | ICD-10-CM | POA: Diagnosis not present

## 2024-01-14 DIAGNOSIS — M96840 Postprocedural hematoma of a musculoskeletal structure following a musculoskeletal system procedure: Secondary | ICD-10-CM | POA: Diagnosis not present

## 2024-01-14 DIAGNOSIS — Z87891 Personal history of nicotine dependence: Secondary | ICD-10-CM | POA: Diagnosis not present

## 2024-01-14 DIAGNOSIS — D72829 Elevated white blood cell count, unspecified: Secondary | ICD-10-CM | POA: Diagnosis not present

## 2024-01-14 DIAGNOSIS — S61501A Unspecified open wound of right wrist, initial encounter: Secondary | ICD-10-CM | POA: Diagnosis not present

## 2024-01-14 DIAGNOSIS — R0789 Other chest pain: Secondary | ICD-10-CM | POA: Diagnosis not present

## 2024-01-14 DIAGNOSIS — Z96653 Presence of artificial knee joint, bilateral: Secondary | ICD-10-CM | POA: Diagnosis not present

## 2024-01-14 DIAGNOSIS — Z4689 Encounter for fitting and adjustment of other specified devices: Secondary | ICD-10-CM

## 2024-01-14 DIAGNOSIS — I700 Atherosclerosis of aorta: Secondary | ICD-10-CM | POA: Diagnosis not present

## 2024-01-14 HISTORY — PX: FASCIECTOMY, UPPER EXTREMITY: SHX7335

## 2024-01-14 HISTORY — PX: CARPAL TUNNEL RELEASE: SHX101

## 2024-01-14 HISTORY — PX: INCISION AND DRAINAGE OF WOUND: SHX1803

## 2024-01-14 LAB — CBC WITH DIFFERENTIAL/PLATELET
Abs Immature Granulocytes: 0.04 10*3/uL (ref 0.00–0.07)
Basophils Absolute: 0 10*3/uL (ref 0.0–0.1)
Basophils Relative: 0 %
Eosinophils Absolute: 0 10*3/uL (ref 0.0–0.5)
Eosinophils Relative: 0 %
HCT: 41.3 % (ref 36.0–46.0)
Hemoglobin: 12.9 g/dL (ref 12.0–15.0)
Immature Granulocytes: 0 %
Lymphocytes Relative: 7 %
Lymphs Abs: 0.8 10*3/uL (ref 0.7–4.0)
MCH: 33.1 pg (ref 26.0–34.0)
MCHC: 31.2 g/dL (ref 30.0–36.0)
MCV: 105.9 fL — ABNORMAL HIGH (ref 80.0–100.0)
Monocytes Absolute: 0.8 10*3/uL (ref 0.1–1.0)
Monocytes Relative: 7 %
Neutro Abs: 10 10*3/uL — ABNORMAL HIGH (ref 1.7–7.7)
Neutrophils Relative %: 86 %
Platelets: 272 10*3/uL (ref 150–400)
RBC: 3.9 MIL/uL (ref 3.87–5.11)
RDW: 15.9 % — ABNORMAL HIGH (ref 11.5–15.5)
WBC: 11.7 10*3/uL — ABNORMAL HIGH (ref 4.0–10.5)
nRBC: 0 % (ref 0.0–0.2)

## 2024-01-14 LAB — HIV ANTIBODY (ROUTINE TESTING W REFLEX)
HIV Screen 4th Generation wRfx: NONREACTIVE
HIV Screen 4th Generation wRfx: NONREACTIVE

## 2024-01-14 LAB — BASIC METABOLIC PANEL WITH GFR
Anion gap: 9 (ref 5–15)
BUN: 18 mg/dL (ref 8–23)
CO2: 23 mmol/L (ref 22–32)
Calcium: 8.6 mg/dL — ABNORMAL LOW (ref 8.9–10.3)
Chloride: 103 mmol/L (ref 98–111)
Creatinine, Ser: 0.56 mg/dL (ref 0.44–1.00)
GFR, Estimated: >60 mL/min (ref >60)
Glucose, Bld: 106 mg/dL — ABNORMAL HIGH (ref 70–99)
Potassium: 3.7 mmol/L (ref 3.5–5.1)
Sodium: 135 mmol/L (ref 135–145)

## 2024-01-14 LAB — TROPONIN I (HIGH SENSITIVITY)
Troponin I (High Sensitivity): 15 ng/L (ref <18)
Troponin I (High Sensitivity): 17 ng/L (ref <18)

## 2024-01-14 SURGERY — IRRIGATION AND DEBRIDEMENT WOUND
Anesthesia: General | Site: Wrist | Laterality: Right

## 2024-01-14 MED ORDER — HYDROMORPHONE HCL 1 MG/ML IJ SOLN
0.2500 mg | INTRAMUSCULAR | Status: DC | PRN
  Administered 2024-01-14 (×2): 0.5 mg via INTRAVENOUS

## 2024-01-14 MED ORDER — LACTATED RINGERS IV SOLN: INTRAVENOUS | Status: DC

## 2024-01-14 MED ORDER — FENTANYL CITRATE (PF) 250 MCG/5ML IJ SOLN
INTRAMUSCULAR | Status: AC
  Filled 2024-01-14: qty 5

## 2024-01-14 MED ORDER — HYDROMORPHONE HCL 1 MG/ML IJ SOLN
0.5000 mg | INTRAMUSCULAR | Status: DC | PRN
  Administered 2024-01-15: 0.5 mg via INTRAVENOUS
  Filled 2024-01-14: qty 1

## 2024-01-14 MED ORDER — OXYCODONE HCL 5 MG/5ML PO SOLN: 5.0000 mg | Freq: Once | ORAL | Status: DC | PRN

## 2024-01-14 MED ORDER — HYDROMORPHONE HCL 1 MG/ML IJ SOLN
INTRAMUSCULAR | Status: AC
  Filled 2024-01-14: qty 1

## 2024-01-14 MED ORDER — ONDANSETRON HCL 4 MG PO TABS: 4.0000 mg | ORAL_TABLET | Freq: Four times a day (QID) | ORAL | Status: DC | PRN

## 2024-01-14 MED ORDER — OXYCODONE HCL 5 MG PO TABS: 5.0000 mg | ORAL_TABLET | Freq: Once | ORAL | Status: DC | PRN

## 2024-01-14 MED ORDER — ALUM & MAG HYDROXIDE-SIMETH 200-200-20 MG/5ML PO SUSP
30.0000 mL | Freq: Once | ORAL | Status: AC
  Administered 2024-01-14: 30 mL via ORAL
  Filled 2024-01-14: qty 30

## 2024-01-14 MED ORDER — PANTOPRAZOLE SODIUM 40 MG PO TBEC
40.0000 mg | DELAYED_RELEASE_TABLET | Freq: Every day | ORAL | Status: DC
  Administered 2024-01-14 – 2024-01-15 (×2): 40 mg via ORAL
  Filled 2024-01-14 (×2): qty 1

## 2024-01-14 MED ORDER — POLYETHYLENE GLYCOL 3350 17 G PO PACK: 17.0000 g | PACK | Freq: Every day | ORAL | Status: DC | PRN

## 2024-01-14 MED ORDER — CHLORHEXIDINE GLUCONATE 0.12 % MT SOLN: 15.0000 mL | Freq: Once | OROMUCOSAL | Status: DC

## 2024-01-14 MED ORDER — DROPERIDOL 2.5 MG/ML IJ SOLN: 0.6250 mg | Freq: Once | INTRAMUSCULAR | Status: DC | PRN

## 2024-01-14 MED ORDER — CHLORHEXIDINE GLUCONATE 0.12 % MT SOLN
OROMUCOSAL | Status: AC
Start: 2024-01-14 — End: 2024-01-15
  Filled 2024-01-14: qty 15

## 2024-01-14 MED ORDER — ONDANSETRON HCL 4 MG/2ML IJ SOLN
INTRAMUSCULAR | Status: DC | PRN
  Administered 2024-01-14: 4 mg via INTRAVENOUS

## 2024-01-14 MED ORDER — BUPIVACAINE HCL (PF) 0.5 % IJ SOLN
INTRAMUSCULAR | Status: AC
  Filled 2024-01-14: qty 30

## 2024-01-14 MED ORDER — MIDAZOLAM HCL 2 MG/2ML IJ SOLN
INTRAMUSCULAR | Status: DC | PRN
  Administered 2024-01-14: 2 mg via INTRAVENOUS

## 2024-01-14 MED ORDER — KETOROLAC TROMETHAMINE 30 MG/ML IJ SOLN
30.0000 mg | Freq: Once | INTRAMUSCULAR | Status: AC
Start: 2024-01-14 — End: 2024-01-14
  Administered 2024-01-14: 30 mg via INTRAMUSCULAR
  Filled 2024-01-14: qty 1

## 2024-01-14 MED ORDER — BACITRACIN ZINC 500 UNIT/GM EX OINT
TOPICAL_OINTMENT | CUTANEOUS | Status: AC
  Filled 2024-01-14: qty 28.35

## 2024-01-14 MED ORDER — CEFAZOLIN SODIUM-DEXTROSE 2-4 GM/100ML-% IV SOLN
2.0000 g | INTRAVENOUS | Status: AC
  Administered 2024-01-14: 2 g via INTRAVENOUS

## 2024-01-14 MED ORDER — SODIUM CHLORIDE 0.9% FLUSH: 3.0000 mL | INTRAVENOUS | Status: DC | PRN

## 2024-01-14 MED ORDER — ACETAMINOPHEN 325 MG PO TABS: 650.0000 mg | ORAL_TABLET | Freq: Four times a day (QID) | ORAL | Status: DC | PRN

## 2024-01-14 MED ORDER — BUPIVACAINE HCL (PF) 0.25 % IJ SOLN
INTRAMUSCULAR | Status: AC
  Filled 2024-01-14: qty 30

## 2024-01-14 MED ORDER — DEXAMETHASONE SODIUM PHOSPHATE 10 MG/ML IJ SOLN
INTRAMUSCULAR | Status: DC | PRN
  Administered 2024-01-14: 8 mg via INTRAVENOUS

## 2024-01-14 MED ORDER — ORAL CARE MOUTH RINSE
15.0000 mL | Freq: Once | OROMUCOSAL | Status: DC
Start: 2024-01-14 — End: 2024-01-15

## 2024-01-14 MED ORDER — ONDANSETRON HCL 4 MG/2ML IJ SOLN
4.0000 mg | Freq: Four times a day (QID) | INTRAMUSCULAR | Status: DC | PRN
Start: 2024-01-14 — End: 2024-01-15

## 2024-01-14 MED ORDER — MIDAZOLAM HCL 2 MG/2ML IJ SOLN
INTRAMUSCULAR | Status: AC
  Filled 2024-01-14: qty 2

## 2024-01-14 MED ORDER — SODIUM CHLORIDE 0.9 % IV SOLN: 250.0000 mL | INTRAVENOUS | Status: DC | PRN

## 2024-01-14 MED ORDER — ACETAMINOPHEN 10 MG/ML IV SOLN: 1000.0000 mg | Freq: Once | INTRAVENOUS | Status: DC | PRN

## 2024-01-14 MED ORDER — LEVOTHYROXINE SODIUM 50 MCG PO TABS
50.0000 ug | ORAL_TABLET | Freq: Every day | ORAL | Status: DC
  Administered 2024-01-15: 50 ug via ORAL
  Filled 2024-01-14: qty 1

## 2024-01-14 MED ORDER — HYDRALAZINE HCL 20 MG/ML IJ SOLN: 5.0000 mg | Freq: Four times a day (QID) | INTRAMUSCULAR | Status: DC | PRN

## 2024-01-14 MED ORDER — PROPOFOL 10 MG/ML IV BOLUS
INTRAVENOUS | Status: AC
  Filled 2024-01-14: qty 20

## 2024-01-14 MED ORDER — 0.9 % SODIUM CHLORIDE (POUR BTL) OPTIME
TOPICAL | Status: DC | PRN
  Administered 2024-01-14 (×2): 1000 mL

## 2024-01-14 MED ORDER — ACETAMINOPHEN 500 MG PO TABS
1000.0000 mg | ORAL_TABLET | Freq: Once | ORAL | Status: AC
  Administered 2024-01-14: 1000 mg via ORAL
  Filled 2024-01-14: qty 2

## 2024-01-14 MED ORDER — SODIUM CHLORIDE 0.9% FLUSH
3.0000 mL | Freq: Two times a day (BID) | INTRAVENOUS | Status: DC
  Administered 2024-01-14 – 2024-01-15 (×2): 3 mL via INTRAVENOUS

## 2024-01-14 MED ORDER — ACETAMINOPHEN 650 MG RE SUPP: 650.0000 mg | Freq: Four times a day (QID) | RECTAL | Status: DC | PRN

## 2024-01-14 MED ORDER — MEPERIDINE HCL 25 MG/ML IJ SOLN: 6.2500 mg | INTRAMUSCULAR | Status: DC | PRN

## 2024-01-14 MED ORDER — PROPOFOL 10 MG/ML IV BOLUS
INTRAVENOUS | Status: DC | PRN
  Administered 2024-01-14: 150 mg via INTRAVENOUS

## 2024-01-14 MED ORDER — FENTANYL CITRATE (PF) 250 MCG/5ML IJ SOLN
INTRAMUSCULAR | Status: DC | PRN
  Administered 2024-01-14: 100 ug via INTRAVENOUS

## 2024-01-14 SURGICAL SUPPLY — 65 items
BAG COUNTER SPONGE SURGICOUNT (BAG) ×3 IMPLANT
BAG ZIPLOCK 12X15 (MISCELLANEOUS) ×3 IMPLANT
BNDG COHESIVE 1X5 TAN STRL LF (GAUZE/BANDAGES/DRESSINGS) IMPLANT
BNDG COHESIVE 4X5 WHT NS (GAUZE/BANDAGES/DRESSINGS) IMPLANT
BNDG ELASTIC 3INX 5YD STR LF (GAUZE/BANDAGES/DRESSINGS) ×3 IMPLANT
BNDG ELASTIC 4X5.8 VLCR STR LF (GAUZE/BANDAGES/DRESSINGS) ×3 IMPLANT
BNDG ESMARK 4X9 LF (GAUZE/BANDAGES/DRESSINGS) ×3 IMPLANT
BNDG GAUZE DERMACEA FLUFF 4 (GAUZE/BANDAGES/DRESSINGS) ×3 IMPLANT
CORD BIPOLAR FORCEPS 12FT (ELECTRODE) ×3 IMPLANT
COVER SURGICAL LIGHT HANDLE (MISCELLANEOUS) ×3 IMPLANT
CUFF TOURN SGL QUICK 18X4 (TOURNIQUET CUFF) ×3 IMPLANT
CUFF TRNQT CYL 24X4X16.5-23 (TOURNIQUET CUFF) IMPLANT
DRAIN PENROSE 12X.25 LTX STRL (MISCELLANEOUS) IMPLANT
DRAPE SURG 17X11 SM STRL (DRAPES) ×3 IMPLANT
DRAPE SURG 17X23 STRL (DRAPES) ×3 IMPLANT
DRSG ADAPTIC 3X8 NADH LF (GAUZE/BANDAGES/DRESSINGS) ×3 IMPLANT
DRSG EMULSION OIL 3X3 NADH (GAUZE/BANDAGES/DRESSINGS) ×3 IMPLANT
ELECTRODE REM PT RTRN 9FT ADLT (ELECTROSURGICAL) IMPLANT
GAUZE SPONGE 4X4 12PLY STRL (GAUZE/BANDAGES/DRESSINGS) ×3 IMPLANT
GAUZE STRETCH 2X75IN STRL (MISCELLANEOUS) IMPLANT
GAUZE XEROFORM 1X8 LF (GAUZE/BANDAGES/DRESSINGS) ×3 IMPLANT
GAUZE XEROFORM 5X9 LF (GAUZE/BANDAGES/DRESSINGS) IMPLANT
GLOVE BIO SURGEON STRL SZ7.5 (GLOVE) ×3 IMPLANT
GLOVE BIO SURGEON STRL SZ8 (GLOVE) ×3 IMPLANT
GLOVE BIOGEL PI IND STRL 7.5 (GLOVE) ×3 IMPLANT
GLOVE SURG UNDER POLY LF SZ7.5 (GLOVE) ×6 IMPLANT
GOWN STRL REUS W/ TWL LRG LVL3 (GOWN DISPOSABLE) ×9 IMPLANT
GOWN STRL REUS W/ TWL XL LVL3 (GOWN DISPOSABLE) ×3 IMPLANT
KIT BASIN OR (CUSTOM PROCEDURE TRAY) ×3 IMPLANT
KIT TURNOVER KIT B (KITS) ×3 IMPLANT
MANIFOLD NEPTUNE II (INSTRUMENTS) ×3 IMPLANT
NDL HYPO 25GX1X1/2 BEV (NEEDLE) IMPLANT
NDL HYPO 25X1 1.5 SAFETY (NEEDLE) ×3 IMPLANT
NEEDLE HYPO 25GX1X1/2 BEV (NEEDLE) IMPLANT
NEEDLE HYPO 25X1 1.5 SAFETY (NEEDLE) ×3 IMPLANT
NS IRRIG 1000ML POUR BTL (IV SOLUTION) ×3 IMPLANT
PACK ORTHO EXTREMITY (CUSTOM PROCEDURE TRAY) ×3 IMPLANT
PAD ARMBOARD POSITIONER FOAM (MISCELLANEOUS) ×6 IMPLANT
PAD CAST 3X4 CTTN HI CHSV (CAST SUPPLIES) ×3 IMPLANT
PAD CAST 4YDX4 CTTN HI CHSV (CAST SUPPLIES) ×3 IMPLANT
PADDING CAST ABS COTTON 3X4 (CAST SUPPLIES) IMPLANT
PENCIL SMOKE EVACUATOR (MISCELLANEOUS) IMPLANT
SET CYSTO W/LG BORE CLAMP LF (SET/KITS/TRAYS/PACK) IMPLANT
SET HNDPC FAN SPRY TIP SCT (DISPOSABLE) IMPLANT
SOAP 2 % CHG 4 OZ (WOUND CARE) ×3 IMPLANT
SOL PREP POV-IOD 4OZ 10% (MISCELLANEOUS) ×3 IMPLANT
SOLUTION SCRB POV-IOD 4OZ 7.5% (MISCELLANEOUS) ×3 IMPLANT
SPIKE FLUID TRANSFER (MISCELLANEOUS) ×3 IMPLANT
SPLINT FIBERGLASS 3X12 (CAST SUPPLIES) IMPLANT
SPONGE T-LAP 18X18 ~~LOC~~+RFID (SPONGE) ×3 IMPLANT
SPONGE T-LAP 4X18 ~~LOC~~+RFID (SPONGE) ×3 IMPLANT
SUT ETHILON 4 0 PS 2 18 (SUTURE) IMPLANT
SUT NYLON ETHILON 5-0 P-3 1X18 (SUTURE) IMPLANT
SUT PROLENE 3 0 PS 2 (SUTURE) ×3 IMPLANT
SUT VIC AB 3-0 PS2 18XBRD (SUTURE) ×3 IMPLANT
SUT VICRYL RAPIDE 3 0 (SUTURE) IMPLANT
SWAB COLLECTION DEVICE MRSA (MISCELLANEOUS) ×3 IMPLANT
SWAB CULTURE ESWAB REG 1ML (MISCELLANEOUS) IMPLANT
SYR CONTROL 10ML LL (SYRINGE) ×3 IMPLANT
TOWEL GREEN STERILE (TOWEL DISPOSABLE) ×3 IMPLANT
TOWEL GREEN STERILE FF (TOWEL DISPOSABLE) ×3 IMPLANT
TUBE CONNECTING 12X1/4 (SUCTIONS) ×3 IMPLANT
UNDERPAD 30X36 HEAVY ABSORB (UNDERPADS AND DIAPERS) ×3 IMPLANT
WATER STERILE IRR 1000ML POUR (IV SOLUTION) ×3 IMPLANT
YANKAUER SUCT BULB TIP NO VENT (SUCTIONS) ×3 IMPLANT

## 2024-01-14 NOTE — Plan of Care (Signed)

## 2024-01-14 NOTE — Transfer of Care (Signed)
 Immediate Anesthesia Transfer of Care Note  Patient: Erin Vasquez  Procedure(s) Performed: RIGHT WRIST IRRIGATION AND DEBRIDEMENT (Right: Wrist) RIGHT CARPAL TUNNEL RELEASE (Right: Hand) FASCIECTOMY (Right: Arm Lower)  Patient Location: PACU  Anesthesia Type:General  Level of Consciousness: awake, alert , and oriented  Airway & Oxygen Therapy: Patient Spontanous Breathing  Post-op Assessment: Report given to RN and Post -op Vital signs reviewed and stable  Post vital signs: Reviewed and stable  Last Vitals:  Vitals Value Taken Time  BP 180/96 01/14/24 1441  Temp    Pulse 64 01/14/24 1442  Resp 11 01/14/24 1442  SpO2 96 % 01/14/24 1442  Vitals shown include unfiled device data.  Last Pain:  Vitals:   01/14/24 1214  TempSrc:   PainSc: 8       Patients Stated Pain Goal: 6 (01/14/24 1214)  Complications: No notable events documented.

## 2024-01-14 NOTE — Progress Notes (Signed)
 Orthopedic Tech Progress Note Patient Details:  Erin Vasquez 09/02/1956 [id number] Originally was paged for the removal of short arm cast. Pt had already taken off cast when I arrived. MD verbally ordered a volar splint. Therefore, I applied a volar splint per order.  Ortho Devices Type of Ortho Device: Volar splint Ortho Device/Splint Location: RUE Ortho Device/Splint Interventions: Application, Adjustment   Post Interventions Patient Tolerated: Fair Instructions Provided: Adjustment of device, Care of device  Rayna Calkin 01/14/2024, 4:14 AM

## 2024-01-14 NOTE — ED Triage Notes (Signed)
 Pt reports having a cast placed on her right arm on Wednesday. Sts concern for increased numbness and pain today.

## 2024-01-14 NOTE — Discharge Instructions (Signed)
 Troponins x 2 negative no concerns for an acute cardiac event.  Patient has follow-up with [facility] this morning.  Patient cleared for discharge.

## 2024-01-14 NOTE — Anesthesia Postprocedure Evaluation (Signed)
 Anesthesia Post Note  Patient: Erin Vasquez  Procedure(s) Performed: RIGHT WRIST IRRIGATION AND DEBRIDEMENT (Right: Wrist) RIGHT CARPAL TUNNEL RELEASE (Right: Hand) FASCIECTOMY (Right: Arm Lower)     Patient location during evaluation: PACU Anesthesia Type: General Level of consciousness: awake and alert Pain management: pain level controlled Vital Signs Assessment: post-procedure vital signs reviewed and stable Respiratory status: spontaneous breathing, nonlabored ventilation, respiratory function stable and patient connected to nasal cannula oxygen Cardiovascular status: blood pressure returned to baseline and stable Postop Assessment: no apparent nausea or vomiting Anesthetic complications: no  No notable events documented.  Last Vitals:  Vitals:   01/14/24 1545 01/14/24 1641  BP: (!) 171/99 (!) 158/91  Pulse: 63 64  Resp: 15 18  Temp: 36.6 C 36.4 C  SpO2: 94% 97%    Last Pain:  Vitals:   01/14/24 1641  TempSrc: Oral  PainSc:                  Ezella Kell D Kendahl Bumgardner

## 2024-01-14 NOTE — H&P (Signed)
 [facility] History and Physical  Erin Vasquez WUJ:[id number] DOB: [birth year] DOA: 01/14/2024  Referring physician: ED  PCP: Claudell Cruz, MD   Patient is coming from: Home  Chief Complaint: Right upper extremity hematoma  HPI:    Patient is a 68 years old female with past medical history of antiphospholipid antibody syndrome with recurrent DVT and thromboembolism on lifelong anticoagulation, currently on Lovenox  as outpatient being followed by [facility] hematology presented to hospital with right hand wrist and forearm pain and swelling and hematoma with numbness and tingling of the digits.  Of note patient had surgical intervention for right distal radial shaft comminuted fracture and had undergone ORIF.  Patient then continued to have swelling pain and was noted to have hematoma so was admitted to the hospital and was noted to have right wrist surgical site hematoma with carpal tunnel syndrome and impending compartment syndrome show patient underwent fasciotomy with hematoma evacuation by hand surgery.  Medical team was called in for admission to the hospital for overnight observation.  Of note patient was recently hospitalized for left thigh hematoma in the setting of Lovenox  and was noted to have hemoglobin of 5.1 and required multiple units of RBC.  Lovenox  was stopped and was later transitioned to heparin  as a bridge to Coumadin.   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   Status post fasciotomy and hematoma evacuation.    Orthopedic team wishes follow-up in the hospital overnight to ensure no further bleeding and surgical recovery.  Further management as per hand surgery.  Possible antiphospholipid antibody syndrome.   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  Had recurrent DVT and thromboembolism in the past.   Had failed DOAC in the  past.  On Lovenox  as outpatient.  History of gastric surgery so unable to absorb oral anticoagulants.  Would recommend initiating Lovenox  when okay with hand surgery.  Seen by hematology at [facility] on 12/24/2023.  Plan is for lifelong anticoagulation.  Plan is to restart Lovenox  12 hours after surgery as per hematology at [facility] but at the discretion of the surgeon.  Elevated BP without any history of hypertension.  Could be secondary to surgery, pain.  Will continue to monitor while in the hospital.  Hypothyroidism.  Continue Synthroid .  History of gastric surgery  History of Roux-en-Y gastric bypass   DVT Prophylaxis: None for now.  Review of Systems:  All systems were reviewed and were negative unless otherwise mentioned in the HPI   Past Medical History:  Diagnosis Date   Anemia    during pregnancy   Anxiety    Arthritis    "all over" (07/07/2017)   Bilateral pulmonary embolism (HCC) 10/29/2015   "from my left ankle"   Childhood asthma    as child, nothing as adult   Chronic back pain    "all over" (07/07/2017)   DVT (deep venous thrombosis) (HCC) 10/29/2015   "from my left ankle"   GERD (gastroesophageal reflux disease)    tx. Tums   H/O hiatal hernia    "repaired w/gastric bypass OR" (07/07/2017)   History of esophageal dilatation    Hypothyroidism    Lupus anticoagulant disorder (HCC)    Dr. Dirk Fredericks    Neuromuscular disorder (HCC)    L thigh - Numbness, nerve damage     Peripheral vascular disease (HCC)    Sacroiliac pain    "left  thigh has been numb since 01/2016 after appendectomy" (07/07/2017)   Sleep apnea    07/07/2017 "bariatric dr says I don't need CPAP anymore" (07/07/2017)   Thyroid disease    Uterine cancer (HCC) 01/2014   Varicose veins    Ventral hernia    Vitamin D  deficiency    Past Surgical History:  Procedure Laterality Date   ABDOMINAL HYSTERECTOMY  01/2014   "robotic"   APPENDECTOMY     ruptured 01/2016   BALLOON DILATION N/A  08/02/2013   Procedure: BALLOON DILATION;  Surgeon: Evangeline Hilts, MD;  Location: [facility];  Service: Endoscopy;  Laterality: N/A;   BUNIONECTOMY WITH HAMMERTOE RECONSTRUCTION Left ~ 2005   COLONOSCOPY WITH PROPOFOL  N/A 08/02/2013   Procedure: COLONOSCOPY WITH PROPOFOL ;  Surgeon: Evangeline Hilts, MD;  Location: [facility];  Service: Endoscopy;  Laterality: N/A;   DILATION AND CURETTAGE OF UTERUS  x2   S/P miscarriage   ESOPHAGOGASTRODUODENOSCOPY (EGD) WITH ESOPHAGEAL DILATION  ~ 2003   ESOPHAGOGASTRODUODENOSCOPY (EGD) WITH PROPOFOL  N/A 08/02/2013   Procedure: ESOPHAGOGASTRODUODENOSCOPY (EGD) WITH PROPOFOL ;  Surgeon: Evangeline Hilts, MD;  Location: [facility];  Service: Endoscopy;  Laterality: N/A;   HERNIA REPAIR  04/2017   VHR   INSERTION OF MESH N/A 04/28/2017   Procedure: POSSIBLE INSERTION OF MESH;  Surgeon: Oza Blumenthal, MD;  Location: [facility];  Service: General;  Laterality: N/A;   JOINT REPLACEMENT     LAPAROSCOPIC APPENDECTOMY N/A 02/07/2016   Procedure: LAPAROSCOPIC APPENDECTOMY;  Surgeon: Oza Blumenthal, MD;  Location: [facility] OR;  Service: General;  Laterality: N/A;   OPEN REDUCTION INTERNAL FIXATION (ORIF) DISTAL RADIAL FRACTURE Right 12/28/2023   Procedure: OPEN REDUCTION INTERNAL FIXATION (ORIF) DISTAL RADIUS FRACTURE;  Surgeon: Saundra Curl, MD;  Location: [facility];  Service: Orthopedics;  Laterality: Right;   ROBOTIC ASSISTED TOTAL HYSTERECTOMY WITH BILATERAL SALPINGO OOPHERECTOMY  01/23/2014   uterine cancer   ROUX-EN-Y GASTRIC BYPASS  11/2016   [facility]   SHOULDER ARTHROSCOPY WITH DEBRIDEMENT AND BICEP TENDON REPAIR Left 09/11/2014   Procedure: SHOULDER ARTHROSCOPY WITH DEBRIDEMENT AND BICEP TENDON REPAIR;  Surgeon: Saundra Curl, MD;  Location: [facility];  Service: Orthopedics;  Laterality: Left;   SHOULDER ARTHROSCOPY WITH DISTAL CLAVICLE RESECTION Left 09/11/2014   Procedure: SHOULDER ARTHROSCOPY WITH DISTAL CLAVICLE RESECTION;  Surgeon: Saundra Curl, MD;  Location: [facility];  Service: Orthopedics;  Laterality: Left;   SHOULDER ARTHROSCOPY WITH SUBACROMIAL DECOMPRESSION Left 09/11/2014   Procedure: SHOULDER ARTHROSCOPY WITH SUBACROMIAL DECOMPRESSION;  Surgeon: Saundra Curl, MD;  Location: [facility];  Service: Orthopedics;  Laterality: Left;   TONSILLECTOMY     TOTAL KNEE ARTHROPLASTY Right 07/06/2017   TOTAL KNEE ARTHROPLASTY Right 07/06/2017   Procedure: RIGHT TOTAL KNEE ARTHROPLASTY;  Surgeon: Saundra Curl, MD;  Location: [facility] OR;  Service: Orthopedics;  Laterality: Right;   TOTAL KNEE ARTHROPLASTY Left 04/19/2018   Procedure: LEFT TOTAL KNEE ARTHROPLASTY;  Surgeon: Saundra Curl, MD;  Location: [facility] OR;  Service: Orthopedics;  Laterality: Left;   TOTAL SHOULDER ARTHROPLASTY Left 04/04/2019   Procedure: TOTAL SHOULDER ARTHROPLASTY reverse;  Surgeon: Saundra Curl, MD;  Location: [facility];  Service: Orthopedics;  Laterality: Left;   VENTRAL HERNIA REPAIR N/A 04/28/2017   Procedure: HERNIA REPAIR VENTRAL ADULT;  Surgeon: Oza Blumenthal, MD;  Location: [facility] OR;  Service: General;  Laterality: N/A;    Social History:  reports that she has never smoked. She has quit using smokeless tobacco.  Her smokeless tobacco use included chew. She reports that  she does not drink alcohol and does not use drugs.  Allergies  Allergen Reactions   Bee Venom Swelling, Rash and Other (See Comments)    Patient is allergic to bee venom, mosquitos, wasps, yellow jackets and bees. Localized swelling Fever     Hymenoptera Venom Preparations Rash and Other (See Comments)    Patient is allergic to bee venom, mosquitos, wasps, yellow jackets and bees. Localized swelling Fever   Other Anaphylaxis, Shortness Of Breath, Itching and Rash    HICKORY SMOKE    Penicillins Anaphylaxis and Other (See Comments)    Did it involve swelling of the face/tongue/throat, SOB, or low BP? Yes Did it involve sudden or severe rash/hives, skin peeling, or any reaction on  the inside of your mouth or nose? No Did you need to seek medical attention at a hospital or doctor's office? Yes When did it last happen?      childhood allergy If all above answers are "NO", may proceed with cephalosporin use.    Latex Rash    Latex rash due to elastic in zip-up TED hose at home (local rash), pt. Has had flu vaccine before without problems.   Morphine  And Codeine Itching and Swelling   Plastibase Rash   Tangerine Flavoring Agent (Non-Screening) Rash    Family History  Problem Relation Age of Onset   Transient ischemic attack Mother    Heart attack Father    Diabetes type II Other    Lung cancer Maternal Grandmother      Prior to Admission medications   Medication Sig Start Date End Date Taking? Authorizing Provider  clindamycin  (CLEOCIN ) 300 MG capsule  09/29/19  Yes [provider]  enoxaparin  (LOVENOX ) 80 MG/0.8ML injection Inject 80 mg into the skin.   Yes [provider]  famotidine  (PEPCID ) 20 MG tablet Take 20 mg by mouth daily.  09/01/18  Yes [provider]  fluticasone  (CUTIVATE ) 0.05 % cream Apply 1 application topically 2 (two) times daily as needed (rash).   Yes [provider]  HYDROcodone -acetaminophen  (NORCO) 10-325 MG tablet Take 1 tablet by mouth every 6 (six) hours as needed for severe pain (pain score 7-10). 12/28/23  Yes Gawne, Meghan M, PA-C  levothyroxine  (SYNTHROID , LEVOTHROID) 50 MCG tablet Take 50 mcg by mouth daily before breakfast.    Yes [provider]  meloxicam  (MOBIC ) 15 MG tablet Take 1 tablet (15 mg total) by mouth daily as needed for pain (and inflammation). 12/28/23  Yes Gawne, Meghan M, PA-C  methylPREDNISolone  acetate (DEPO-MEDROL ) 40 MG/ML injection SMARTSIG:2 Milliliter(s) IM Once PRN 07/19/19  Yes [provider]  Multiple Vitamin (MULTIVITAMIN WITH MINERALS) TABS tablet Take 1 tablet by mouth 2 (two) times daily.    Yes [provider]  omeprazole (PRILOSEC) 40 MG  capsule Take 40 mg by mouth every evening.    Yes [provider]  ondansetron  (ZOFRAN ) 4 MG tablet Take 1 tablet (4 mg total) by mouth every 8 (eight) hours as needed for nausea or vomiting. 02/02/23  Yes Sikora, Rebecca, DPM  ondansetron  (ZOFRAN -ODT) 4 MG disintegrating tablet Take 1 tablet (4 mg total) by mouth every 8 (eight) hours as needed for nausea or vomiting. 12/28/23  Yes Gawne, Meghan M, PA-C  pseudoephedrine (SUDAFED) 120 MG 12 hr tablet Take 120 mg by mouth every 12 (twelve) hours as needed for congestion.   Yes [provider]  sulfamethoxazole-trimethoprim (BACTRIM DS) 800-160 MG tablet Take 1 tablet by mouth 2 (two) times daily. 10/23/19  Yes  [provider]  triamcinolone ointment (KENALOG) 0.5 % Apply 1 application topically 2 (two) times daily as needed (rash).   Yes [provider]  Calcium Carbonate-Vitamin D  (CALTRATE 600+D PO) Take 1 tablet by mouth daily.    [provider]  Cholecalciferol  (VITAMIN D ) 50 MCG (2000 UT) tablet Take 2,000 Units by mouth daily.    [provider]  diclofenac Sodium (VOLTAREN) 1 % GEL  07/13/18   [provider]  ondansetron  (ZOFRAN ) 4 MG tablet Take 1 tablet (4 mg total) by mouth every 8 (eight) hours as needed for nausea or vomiting. 04/04/19   Carlie Chen III, PA-C    Physical Exam:  Vitals:   01/14/24 1440 01/14/24 1445 01/14/24 1500 01/14/24 1515  BP: (!) 180/96 (!) 189/96 (!) 179/94 (!) 173/92  Pulse: 74 61 68 62  Resp: 10 11 11 13   Temp: 98.1 F (36.7 C)     TempSrc:      SpO2: 96% 98% 93% 95%  Weight:      Height:       Wt Readings from Last 3 Encounters:  01/14/24 65.3 kg  12/28/23 67.6 kg  04/04/19 72.5 kg   Body mass index is 25.51 kg/m.  General:  Average built, not in obvious distress HENT: Normocephalic, No scleral pallor or icterus noted. Oral mucosa is moist.  Chest:  Clear breath sounds.  . No crackles or wheezes.  CVS: S1 &S2 heard. No  murmur.  Regular rate and rhythm. Abdomen: Soft, nontender, nondistended.  Bowel sounds are heard. No abdominal mass palpated Extremities: No cyanosis, clubbing or edema.  Peripheral pulses are palpable. Psych: Alert, awake and oriented, normal mood CNS:  No cranial nerve deficits.  Power equal in all extremities.   Skin: Warm and dry.  No rashes noted.  Labs on Admission:   CBC: Recent Labs  Lab 01/14/24 0510  WBC 11.7*  NEUTROABS 10.0*  HGB 12.9  HCT 41.3  MCV 105.9*  PLT 272    Basic Metabolic Panel: Recent Labs  Lab 01/14/24 0510  NA 135  K 3.7  CL 103  CO2 23  GLUCOSE 106*  BUN 18  CREATININE 0.56  CALCIUM 8.6*    Liver Function Tests: No results for input(s): "AST", "ALT", "ALKPHOS", "BILITOT", "PROT", "ALBUMIN" in the last 168 hours. No results for input(s): "LIPASE", "AMYLASE" in the last 168 hours. No results for input(s): "AMMONIA" in the last 168 hours.  Cardiac Enzymes: No results for input(s): "CKTOTAL", "CKMB", "CKMBINDEX", "TROPONINI" in the last 168 hours.  BNP (last 3 results) No results for input(s): "BNP" in the last 8760 hours.  ProBNP (last 3 results) No results for input(s): "PROBNP" in the last 8760 hours.  CBG: No results for input(s): "GLUCAP" in the last 168 hours.  Lipase  No results found for: "LIPASE"   Urinalysis    Component Value Date/Time   COLORURINE YELLOW 03/27/2019 0916   APPEARANCEUR CLEAR 03/27/2019 0916   LABSPEC 1.024 03/27/2019 0916   PHURINE 5.0 03/27/2019 0916   GLUCOSEU NEGATIVE 03/27/2019 0916   HGBUR NEGATIVE 03/27/2019 0916   BILIRUBINUR NEGATIVE 03/27/2019 0916   KETONESUR NEGATIVE 03/27/2019 0916   PROTEINUR NEGATIVE 03/27/2019 0916   NITRITE NEGATIVE 03/27/2019 0916   LEUKOCYTESUR TRACE (A) 03/27/2019 0916     Drugs of Abuse  No results found for: "LABOPIA", "COCAINSCRNUR", "LABBENZ", "AMPHETMU", "THCU", "LABBARB"    Radiological Exams on Admission: DG Chest Portable 1 View Result Date:  01/14/2024 CLINICAL DATA:  Chest  pains. EXAM: PORTABLE CHEST 1 VIEW COMPARISON:  PA and lateral chest 02/23/2017 FINDINGS: The heart size and mediastinal contours are within normal limits. There is calcification in the transverse aorta. A superimposed skin fold simulates a left apicolateral pneumothorax. No pneumothorax is suspected. Both lungs are clear. There is osteopenia, thoracic spondylosis, and interval new reverse left shoulder arthroplasty. IMPRESSION: No evidence of acute chest disease. Aortic atherosclerosis. Osteopenia and degenerative change. Electronically Signed   By: Denman Fischer M.D.   On: 01/14/2024 05:00    EKG: Not able for review   Consultant: Hand surgery  Code Status: Full code  Microbiology none  Antibiotics: None  Family Communication:  Patients' condition and plan of care including tests being ordered have been discussed with the patient and *** who indicate understanding and agree with the plan.   Status is: Observation The patient remains OBS appropriate and will d/c before 2 midnights.   Severity of Illness: The appropriate patient status for this patient is OBSERVATION. Observation status is judged to be reasonable and necessary in order to provide the required intensity of service to ensure the patient's safety. The patient's presenting symptoms, physical exam findings, and initial radiographic and laboratory data in the context of their medical condition is felt to place them at decreased risk for further clinical deterioration. Furthermore, it is anticipated that the patient will be medically stable for discharge from the hospital within 2 midnights of admission.   Signed, Rosena Conradi, MD [facility] 01/14/2024

## 2024-01-14 NOTE — ED Provider Notes (Signed)
 Patient delta troponin 17 no significant change from the first 1 of 15.  Patient still with some chest discomfort.  Also patient with complaint of some pain to the left hand that was splinted.  She has follow-up with orthopedics this morning.  Will give 1000 mg of Tylenol .   Jesus Nevills, MD 01/14/24 [phone]

## 2024-01-14 NOTE — Anesthesia Preprocedure Evaluation (Addendum)
 Anesthesia Evaluation  Patient identified by MRN, date of birth, ID band Patient awake    Reviewed: Allergy & Precautions, NPO status , Patient's Chart, lab work & pertinent test results  Airway Mallampati: II  TM Distance: >3 FB Neck ROM: Full    Dental  (+) Teeth Intact, Dental Advisory Given   Pulmonary asthma , sleep apnea    breath sounds clear to auscultation       Cardiovascular hypertension, + Peripheral Vascular Disease   Rhythm:Regular Rate:Normal     Neuro/Psych  Headaches  Anxiety      Neuromuscular disease    GI/Hepatic Neg liver ROS, hiatal hernia,GERD  Medicated,,  Endo/Other  Hypothyroidism    Renal/GU Renal disease     Musculoskeletal   Abdominal   Peds  Hematology  (+) Blood dyscrasia, anemia   Anesthesia Other Findings   Reproductive/Obstetrics                             Anesthesia Physical Anesthesia Plan  ASA: 3  Anesthesia Plan: General   Post-op Pain Management: Tylenol  PO (pre-op)* and Toradol  IV (intra-op)*   Induction: Intravenous  PONV Risk Score and Plan: 4 or greater and Ondansetron , Dexamethasone , Midazolam  and Scopolamine  patch - Pre-op  Airway Management Planned: Oral ETT  Additional Equipment: None  Intra-op Plan:   Post-operative Plan: Extubation in OR  Informed Consent: I have reviewed the patients History and Physical, chart, labs and discussed the procedure including the risks, benefits and alternatives for the proposed anesthesia with the patient or authorized representative who has indicated his/her understanding and acceptance.     Dental advisory given  Plan Discussed with: CRNA  Anesthesia Plan Comments:        Anesthesia Quick Evaluation

## 2024-01-14 NOTE — Progress Notes (Signed)
 Notified Dr. Otis Blocker of elevated BP. Pt states it is due to pain as she is normally not hypertensive or on blood pressure medication. No new orders at this time.

## 2024-01-14 NOTE — Anesthesia Procedure Notes (Signed)
 Procedure Name: LMA Insertion Date/Time: 01/14/2024 1:45 PM  Performed by: Emmitt Harp, CRNAPre-anesthesia Checklist: Patient identified, Emergency Drugs available, Suction available and Patient being monitored Patient Re-evaluated:Patient Re-evaluated prior to induction Oxygen Delivery Method: Circle System Utilized Preoxygenation: Pre-oxygenation with 100% oxygen Induction Type: IV induction Ventilation: Mask ventilation without difficulty LMA: LMA inserted LMA Size: 4.0 Number of attempts: 1 Airway Equipment and Method: Bite block Placement Confirmation: positive ETCO2 Tube secured with: Tape Dental Injury: Teeth and Oropharynx as per pre-operative assessment

## 2024-01-14 NOTE — H&P (Signed)
 Orthopedic Hand Surgery Consultation:  Reason for Consult: Right wrist pain, hematoma Referring Physician: Dr.Landua/Murphy   HPI: Erin Vasquez is a(an) 68 y.o. female who presents with right hand wrist and forearm pain and swelling and hematoma and numbness and tingling in the digits.  She had surgery 2 and half weeks ago of the right distal radial shaft comminuted fracture status post ORIF.  She is on chronic blood thinners due to multiple DVTs and PEs and the working diagnosis is a postop hematoma in the right forearm and wrist causing severe compression of the median nerve and severe pain and discomfort and impending compartment syndrome.  She denies other systemic symptoms.  She was in the emergency room due to the severe pain last night and was discharged.  I saw her today in the [facility] office as a consultation/evaluation due to the severe pain and swelling and need for likely presentation to the emergency room versus direct to the operating room for surgical release.  Physical Exam: Right Upper Extremity Physical exam of the right upper extremity shows a well-healed surgical incision over the volar aspect of the forearm.  She has severe pain and swelling in this area.  She clearly has a hematoma beneath.  She can only weakly flex and extend the digits which is limited due to pain.  She has numbness and tingling in the median nerve distribution.  She has excellent perfusion with brisk cap refill.   Assessment/Plan: Right wrist and forearm acute pain hematoma and swelling postop status post right distal radial shaft comminuted fracture ORIF.  Due to the severe pain and swelling and development of neurologic issues in terms of sensation to the tips of the digits we will plan to move forward with an emergent right wrist I&D, evacuation of hematoma, completion of the fasciotomy of the volar forearm fascia and right carpal tunnel release.  The risks and benefits of surgery were  carefully explained including, but not limited to risks of infection, injury to nerves, blood vessels, neighboring structures, recurrence or continued symptoms, loss of motion or strength and the need for rehabilitation or further surgery. After thorough discussion and all questions were answered informed consent was obtained.   The postoperative plan will be for admission to the medical service to optimize her blood thinner medications and to ensure we have good control of the bleeding and resolution of her symptoms while inpatient prior to discharge.    Miller Allis, MD Orthopaedic Hand Surgeon [facility] Office number: [phone] [address], [address] [place], [place] [address]    Past Medical History:  Diagnosis Date   Anemia    during pregnancy   Anxiety    Arthritis    "all over" (07/07/2017)   Bilateral pulmonary embolism (HCC) 10/29/2015   "from my left ankle"   Childhood asthma    as child, nothing as adult   Chronic back pain    "all over" (07/07/2017)   DVT (deep venous thrombosis) (HCC) 10/29/2015   "from my left ankle"   GERD (gastroesophageal reflux disease)    tx. Tums   H/O hiatal hernia    "repaired w/gastric bypass OR" (07/07/2017)   History of esophageal dilatation    Hypothyroidism    Lupus anticoagulant disorder (HCC)    Dr. Dirk Fredericks    Neuromuscular disorder (HCC)    L thigh - Numbness, nerve damage     Peripheral vascular disease (HCC)    Sacroiliac pain    "left thigh has been numb  since 01/2016 after appendectomy" (07/07/2017)   Sleep apnea    07/07/2017 "bariatric dr says I don't need CPAP anymore" (07/07/2017)   Thyroid disease    Uterine cancer (HCC) 01/2014   Varicose veins    Ventral hernia    Vitamin D  deficiency     Past Surgical History:  Procedure Laterality Date   ABDOMINAL HYSTERECTOMY  01/2014   "robotic"   APPENDECTOMY     ruptured 01/2016   BALLOON DILATION N/A 08/02/2013   Procedure: BALLOON DILATION;  Surgeon:  Evangeline Hilts, MD;  Location: [facility];  Service: Endoscopy;  Laterality: N/A;   BUNIONECTOMY WITH HAMMERTOE RECONSTRUCTION Left ~ 2005   COLONOSCOPY WITH PROPOFOL  N/A 08/02/2013   Procedure: COLONOSCOPY WITH PROPOFOL ;  Surgeon: Evangeline Hilts, MD;  Location: [facility];  Service: Endoscopy;  Laterality: N/A;   DILATION AND CURETTAGE OF UTERUS  x2   S/P miscarriage   ESOPHAGOGASTRODUODENOSCOPY (EGD) WITH ESOPHAGEAL DILATION  ~ 2003   ESOPHAGOGASTRODUODENOSCOPY (EGD) WITH PROPOFOL  N/A 08/02/2013   Procedure: ESOPHAGOGASTRODUODENOSCOPY (EGD) WITH PROPOFOL ;  Surgeon: Evangeline Hilts, MD;  Location: [facility];  Service: Endoscopy;  Laterality: N/A;   HERNIA REPAIR  04/2017   [facility]   INSERTION OF MESH N/A 04/28/2017   Procedure: POSSIBLE INSERTION OF MESH;  Surgeon: Oza Blumenthal, MD;  Location: [facility];  Service: General;  Laterality: N/A;   JOINT REPLACEMENT     LAPAROSCOPIC APPENDECTOMY N/A 02/07/2016   Procedure: LAPAROSCOPIC APPENDECTOMY;  Surgeon: Oza Blumenthal, MD;  Location: [facility];  Service: General;  Laterality: N/A;   OPEN REDUCTION INTERNAL FIXATION (ORIF) DISTAL RADIAL FRACTURE Right 12/28/2023   Procedure: OPEN REDUCTION INTERNAL FIXATION (ORIF) DISTAL RADIUS FRACTURE;  Surgeon: Saundra Curl, MD;  Location: [facility];  Service: Orthopedics;  Laterality: Right;   ROBOTIC ASSISTED TOTAL HYSTERECTOMY WITH BILATERAL SALPINGO OOPHERECTOMY  01/23/2014   uterine cancer   ROUX-EN-Y GASTRIC BYPASS  11/2016   [facility]   SHOULDER ARTHROSCOPY WITH DEBRIDEMENT AND BICEP TENDON REPAIR Left 09/11/2014   Procedure: SHOULDER ARTHROSCOPY WITH DEBRIDEMENT AND BICEP TENDON REPAIR;  Surgeon: Saundra Curl, MD;  Location: [facility];  Service: Orthopedics;  Laterality: Left;   SHOULDER ARTHROSCOPY WITH DISTAL CLAVICLE RESECTION Left 09/11/2014   Procedure: SHOULDER ARTHROSCOPY WITH DISTAL CLAVICLE RESECTION;  Surgeon: Saundra Curl, MD;  Location: [facility];  Service: Orthopedics;   Laterality: Left;   SHOULDER ARTHROSCOPY WITH SUBACROMIAL DECOMPRESSION Left 09/11/2014   Procedure: SHOULDER ARTHROSCOPY WITH SUBACROMIAL DECOMPRESSION;  Surgeon: Saundra Curl, MD;  Location: [facility];  Service: Orthopedics;  Laterality: Left;   TONSILLECTOMY     TOTAL KNEE ARTHROPLASTY Right 07/06/2017   TOTAL KNEE ARTHROPLASTY Right 07/06/2017   Procedure: RIGHT TOTAL KNEE ARTHROPLASTY;  Surgeon: Saundra Curl, MD;  Location: [facility] OR;  Service: Orthopedics;  Laterality: Right;   TOTAL KNEE ARTHROPLASTY Left 04/19/2018   Procedure: LEFT TOTAL KNEE ARTHROPLASTY;  Surgeon: Saundra Curl, MD;  Location: [facility] OR;  Service: Orthopedics;  Laterality: Left;   TOTAL SHOULDER ARTHROPLASTY Left 04/04/2019   Procedure: TOTAL SHOULDER ARTHROPLASTY reverse;  Surgeon: Saundra Curl, MD;  Location: [facility];  Service: Orthopedics;  Laterality: Left;   VENTRAL HERNIA REPAIR N/A 04/28/2017   Procedure: HERNIA REPAIR VENTRAL ADULT;  Surgeon: Oza Blumenthal, MD;  Location: [facility] OR;  Service: General;  Laterality: N/A;    Family History  Problem Relation Age of Onset   Transient ischemic attack Mother    Heart attack Father    Diabetes type II Other  Lung cancer Maternal Grandmother     Social History:  reports that she has never smoked. She has quit using smokeless tobacco.  Her smokeless tobacco use included chew. She reports that she does not drink alcohol and does not use drugs.  Allergies:  Allergies  Allergen Reactions   Bee Venom Swelling, Rash and Other (See Comments)    Patient is allergic to bee venom, mosquitos, wasps, yellow jackets and bees. Localized swelling Fever     Hymenoptera Venom Preparations Rash and Other (See Comments)    Patient is allergic to bee venom, mosquitos, wasps, yellow jackets and bees. Localized swelling Fever   Other Anaphylaxis, Shortness Of Breath, Itching and Rash    HICKORY SMOKE    Penicillins Anaphylaxis and Other (See Comments)    Did it involve  swelling of the face/tongue/throat, SOB, or low BP? Yes Did it involve sudden or severe rash/hives, skin peeling, or any reaction on the inside of your mouth or nose? No Did you need to seek medical attention at a hospital or doctor's office? Yes When did it last happen?      childhood allergy If all above answers are "NO", may proceed with cephalosporin use.    Latex Rash    Latex rash due to elastic in zip-up TED hose at home (local rash), pt. Has had flu vaccine before without problems.   Morphine  And Codeine Itching and Swelling   Plastibase Rash   Tangerine Flavoring Agent (Non-Screening) Rash    Medications: reviewed, no changes to patient's home medications  Results for orders placed or performed during the hospital encounter of 01/14/24 (from the past 48 hours)  CBC with Differential     Status: Abnormal   Collection Time: 01/14/24  5:10 AM  Result Value Ref Range   WBC 11.7 (H) 4.0 - 10.5 K/uL   RBC 3.90 3.87 - 5.11 MIL/uL   Hemoglobin 12.9 12.0 - 15.0 g/dL   HCT 91.4 78.2 - 95.6 %   MCV 105.9 (H) 80.0 - 100.0 fL   MCH 33.1 26.0 - 34.0 pg   MCHC 31.2 30.0 - 36.0 g/dL   RDW 21.3 (H) 08.6 - 57.8 %   Platelets 272 150 - 400 K/uL   nRBC 0.0 0.0 - 0.2 %   Neutrophils Relative % 86 %   Neutro Abs 10.0 (H) 1.7 - 7.7 K/uL   Lymphocytes Relative 7 %   Lymphs Abs 0.8 0.7 - 4.0 K/uL   Monocytes Relative 7 %   Monocytes Absolute 0.8 0.1 - 1.0 K/uL   Eosinophils Relative 0 %   Eosinophils Absolute 0.0 0.0 - 0.5 K/uL   Basophils Relative 0 %   Basophils Absolute 0.0 0.0 - 0.1 K/uL   Immature Granulocytes 0 %   Abs Immature Granulocytes 0.04 0.00 - 0.07 K/uL    Comment: Performed at [facility], [address] [place], [place] [address]  Basic metabolic panel     Status: Abnormal   Collection Time: 01/14/24  5:10 AM  Result Value Ref Range   Sodium 135 135 - 145 mmol/L   Potassium 3.7 3.5 - 5.1 mmol/L   Chloride 103 98 - 111 mmol/L   CO2 23 22 - 32  mmol/L   Glucose, Bld 106 (H) 70 - 99 mg/dL    Comment: Glucose reference range applies only to samples taken after fasting for at least 8 hours.   BUN 18 8 - 23 mg/dL   Creatinine, Ser 9.52 0.44 - 1.00  mg/dL   Calcium 8.6 (L) 8.9 - 10.3 mg/dL   GFR, Estimated >91 >47 mL/min    Comment: (NOTE) Calculated using the CKD-EPI Creatinine Equation (2021)    Anion gap 9 5 - 15    Comment: Performed at [facility], [address] [place], [place] [address]  Troponin I (High Sensitivity)     Status: None   Collection Time: 01/14/24  5:10 AM  Result Value Ref Range   Troponin I (High Sensitivity) 15 <18 ng/L    Comment: (NOTE) Elevated high sensitivity troponin I (hsTnI) values and significant  changes across serial measurements may suggest ACS but many other  chronic and acute conditions are known to elevate hsTnI results.  Refer to the "Links" section for chest pain algorithms and additional  guidance. Performed at [facility], [address], [place], [place] [address]   Troponin I (High Sensitivity)     Status: None   Collection Time: 01/14/24  6:43 AM  Result Value Ref Range   Troponin I (High Sensitivity) 17 <18 ng/L    Comment: (NOTE) Elevated high sensitivity troponin I (hsTnI) values and significant  changes across serial measurements may suggest ACS but many other  chronic and acute conditions are known to elevate hsTnI results.  Refer to the "Links" section for chest pain algorithms and additional  guidance. Performed at [facility], [address], [place], [place] [address]     DG Chest Portable 1 View Result Date: 01/14/2024 CLINICAL DATA:  Chest pains. EXAM: PORTABLE CHEST 1 VIEW COMPARISON:  PA and lateral chest 02/23/2017 FINDINGS: The heart size and mediastinal contours are within normal limits. There is calcification in the transverse aorta. A superimposed skin fold simulates a left apicolateral  pneumothorax. No pneumothorax is suspected. Both lungs are clear. There is osteopenia, thoracic spondylosis, and interval new reverse left shoulder arthroplasty. IMPRESSION: No evidence of acute chest disease. Aortic atherosclerosis. Osteopenia and degenerative change. Electronically Signed   By: Denman Fischer M.D.   On: 01/14/2024 05:00    ROS: 14 point review of systems negative except per HPI

## 2024-01-14 NOTE — Hospital Course (Signed)
 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  Of note patient had surgical intervention for right distal radial shaft comminuted fracture and had undergone ORIF.  Patient then continued to have swelling pain and was noted to have hematoma so was admitted to the hospital and was noted to have right wrist surgical site hematoma with carpal tunnel syndrome and impending compartment syndrome show patient underwent fasciotomy with hematoma evacuation by hand surgery.  Medical team was called in for admission to the hospital for overnight observation.  Of note patient was recently hospitalized for left thigh hematoma in the setting of Lovenox  and was noted to have hemoglobin of 5.1 and required multiple units of RBC.  Lovenox  was stopped and was later transitioned to heparin  as a bridge to Coumadin.   Right wrist surgical site hematoma with carpal tunnel syndrome and impending compartment syndrome.   Status post fasciotomy and hematoma evacuation.    Orthopedic team wishes follow-up in the hospital overnight to ensure no further bleeding and surgical recovery.  Further management as per hand surgery.  Possible antiphospholipid antibody syndrome.   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  Had recurrent DVT and thromboembolism in the past.   Had failed DOAC in the past.  On Lovenox  as outpatient.  History of gastric surgery so unable to absorb oral anticoagulants.  Would recommend initiating Lovenox  when okay with hand surgery.  Seen by hematology at [facility] on 12/24/2023.  Plan is for lifelong anticoagulation.  Plan is to restart Lovenox  12 hours after surgery as  per hematology at [facility] brought at the discretion of the [place] surgeon as well.  Elevated BP without any history of hypertension.  Could be secondary to surgery, pain.  Will continue to monitor while in the hospital.  Hypothyroidism.  Continue Synthroid .  History of gastric surgery  History of Roux-en-Y gastric bypass

## 2024-01-14 NOTE — Op Note (Signed)
 OPERATIVE NOTE  DATE OF PROCEDURE: 01/14/2024  SURGEONS:  Primary: Ltanya Rummer, MD  ASSISTANT: Prentiss Brocks, PA-C  Due to the complexity of the surgery an assistant was necessary to aid in retraction, exposure, limb positioning, closure and dressing application. The use of an assistant on this case follows CMS and CPT guidelines, which allows an assistant to be used because of the complexity level of this case.   PREOPERATIVE DIAGNOSIS: right wrist surgical site hematoma, right wrist carpal tunnel syndrome, right wrist impending compartment syndrome  POSTOPERATIVE DIAGNOSIS: Same  NAME OF PROCEDURE:   Right wrist volar forearm fasciotomy Right wrist surgical site hematoma evacuation Right carpal tunnel release Right thumb flexor pollicis longus flexor tendon sheath release and decompression  ANESTHESIA: General  SKIN PREPARATION: Hibiclens   ESTIMATED BLOOD LOSS: Minimal  IMPLANTS: none  INDICATIONS:  Erin Vasquez is a 68 y.o. female who has the above preoperative diagnosis. The patient has decided to proceed with surgical intervention.  Risks, benefits and alternatives of operative management were discussed including, but not limited to, risks of anesthesia complications, infection, pain, persistent symptoms, stiffness, need for future surgery.  The patient understands, agrees and elects to proceed with surgery.    DESCRIPTION OF PROCEDURE: The patient was met in the pre-operative area and their identity was verified.  The operative location and laterality was also verified and marked.  The patient was brought to the OR and was placed supine on the table.  After repeat patient identification with the operative team anesthesia was provided and the patient was prepped and draped in the usual sterile fashion.  A final timeout was performed verifying the correction patient, procedure, location and laterality.  The right upper extremity was elevated and upper arm tourniquet was inflated to  250 mnHg.  The previous surgical incision over the FCR was identified and was reopened.  Dissection was carried down to the FCR tendon and immediate identification of a large hematoma.  This was thoroughly evacuated and completely decompressed the right wrist and forearm space.  Most of the volar forearm fascia was previously released from the surgical site and was free however the remaining fascia over the proximal forearm and between compartments was released to ensure complete release of any sort of tight fascial bands around the right forearm.  The dorsal compartments of the forearm and of the hand were soft and compressible.  There was no further hematoma within the forearm.  At this time attention was turned to the carpal tunnel.  A 3 cm palmar incision was made.  Dissection was carried down to the palmar fascia and this was incised.  The transverse carpal ligament was identified and released to the level of the fat pad.  This was then released proximally with complete decompression of the median nerve.  There was significant hematoma within the carpal tunnel along the flexor tendons both radially and ulnarly to the median nerve.  The flexor pollicis longus tendon had significant hematoma and this tendon sheath was entered and hematoma completely evacuated.  These wounds were thoroughly irrigated.  The wounds were packed and tourniquet was deflated.  There was excellent perfusion to all digits.  There was no pulsatile bleeding and the wound both in the forearm and the hand were carefully inspected and there was no further bleeding.  The skin was closed with absorbable sutures in standard fashion.  This was closed loosely with no tension.  The hand and forearm compartments were soft and compressible.  At this time a sterile soft  bandage was applied.  A resting volar forearm splint was applied.  This was applied without significant tension or compression.  The fingers were completely free.  At the end of the  case all counts were correct x 2.  The patient tolerated the procedure well.  There was minimal blood loss.  The patient was awoken from anesthesia and brought to PACU for recovery in stable condition.  Postoperative plan: The patient will elevate to the level of the heart.  The patient will work on finger range of motion and elbow range of motion.  She will be nonweightbearing to the right upper limb.  I would like to have medicine admission for overnight observation pain control and reevaluation in the morning with a discussion regarding blood thinner plan.  The patient is on therapeutic Lovenox  for lifetime and the concern will be when to restart this medicine as she does have significant risk for a recurrent bleed and possible loss of limb.  We will weigh this with the risk of recurrent DVT or PE.  We will have a collaborative discussion regarding this with the medical team and plans moving forward.   Zeno Hikes, MD

## 2024-01-14 NOTE — ED Provider Notes (Signed)
 [place] [facility] DEPARTMENT AT [facility] Provider Note   CSN: [id number] Arrival date & time: 01/14/24  0215     History  Chief Complaint  Patient presents with   Arm Numbness     Erin Vasquez is a 68 y.o. female.  The history is provided by the patient.  Hand Pain This is a new problem. The current episode started yesterday. The problem occurs constantly. The problem has not changed since onset.Pertinent negatives include no abdominal pain, no headaches and no shortness of breath. Nothing aggravates the symptoms. Nothing relieves the symptoms. The treatment provided no relief.  Patient with ORIF on Lovenox  therapy presents with cast being too tight and causing numbness.  Told to come in by on call ortho for removal.       Home Medications Prior to Admission medications   Medication Sig Start Date End Date Taking? Authorizing Provider  Calcium Carbonate-Vitamin D  (CALTRATE 600+D PO) Take 1 tablet by mouth daily.    [provider]  Cholecalciferol  (VITAMIN D ) 50 MCG (2000 UT) tablet Take 2,000 Units by mouth daily.    [provider]  clindamycin  (CLEOCIN ) 300 MG capsule  09/29/19   [provider]  diclofenac Sodium (VOLTAREN) 1 % GEL  07/13/18   [provider]  enoxaparin  (LOVENOX ) 80 MG/0.8ML injection Inject 80 mg into the skin.    [provider]  famotidine  (PEPCID ) 20 MG tablet Take 20 mg by mouth daily.  09/01/18   [provider]  fluticasone  (CUTIVATE ) 0.05 % cream Apply 1 application topically 2 (two) times daily as needed (rash).    [provider]  HYDROcodone -acetaminophen  (NORCO) 10-325 MG tablet Take 1 tablet by mouth every 6 (six) hours as needed for severe pain (pain score 7-10). 12/28/23   Gawne, Meghan M, PA-C  levothyroxine  (SYNTHROID , LEVOTHROID) 50 MCG tablet Take 50 mcg by mouth daily before breakfast.     [provider]  meloxicam  (MOBIC ) 15 MG tablet Take 1 tablet  (15 mg total) by mouth daily as needed for pain (and inflammation). 12/28/23   Gawne, Meghan M, PA-C  methylPREDNISolone  acetate (DEPO-MEDROL ) 40 MG/ML injection SMARTSIG:2 Milliliter(s) IM Once PRN 07/19/19   [provider]  Multiple Vitamin (MULTIVITAMIN WITH MINERALS) TABS tablet Take 1 tablet by mouth 2 (two) times daily.     [provider]  omeprazole (PRILOSEC) 40 MG capsule Take 40 mg by mouth every evening.     [provider]  ondansetron  (ZOFRAN ) 4 MG tablet Take 1 tablet (4 mg total) by mouth every 8 (eight) hours as needed for nausea or vomiting. 04/04/19   Carlie Chen III, PA-C  ondansetron  (ZOFRAN ) 4 MG tablet Take 1 tablet (4 mg total) by mouth every 8 (eight) hours as needed for nausea or vomiting. 02/02/23   Jennefer Moats, DPM  ondansetron  (ZOFRAN -ODT) 4 MG disintegrating tablet Take 1 tablet (4 mg total) by mouth every 8 (eight) hours as needed for nausea or vomiting. 12/28/23   Gawne, Meghan M, PA-C  pseudoephedrine (SUDAFED) 120 MG 12 hr tablet Take 120 mg by mouth every 12 (twelve) hours as needed for congestion.    [provider]  sulfamethoxazole-trimethoprim (BACTRIM DS) 800-160 MG tablet Take 1 tablet by mouth 2 (two) times daily. 10/23/19   [provider]  triamcinolone ointment (KENALOG) 0.5 % Apply 1 application topically 2 (two) times daily as needed (rash).    [provider]      Allergies  Bee venom, Hymenoptera venom preparations, Other, Penicillins, Latex, Morphine  and codeine, Plastibase, and Tangerine flavoring agent (non-screening)    Review of Systems   Review of Systems  Constitutional:  Negative for fever.  HENT:  Negative for congestion.   Respiratory:  Negative for shortness of breath.   Gastrointestinal:  Negative for abdominal pain.  Neurological:  Negative for headaches.  All other systems reviewed and are negative.   Physical Exam Updated Vital Signs BP (!) 186/101   Pulse 66    Temp 98.1 F (36.7 C)   Resp 14   SpO2 94%  Physical Exam Vitals and nursing note reviewed.  Constitutional:      General: She is not in acute distress.    Appearance: Normal appearance. She is well-developed.  HENT:     Head: Normocephalic and atraumatic.     Nose: Nose normal.  Eyes:     Pupils: Pupils are equal, round, and reactive to light.  Cardiovascular:     Rate and Rhythm: Normal rate and regular rhythm.     Pulses: Normal pulses.     Heart sounds: Normal heart sounds.  Pulmonary:     Effort: Pulmonary effort is normal. No respiratory distress.     Breath sounds: Normal breath sounds.  Abdominal:     General: Bowel sounds are normal. There is no distension.     Palpations: Abdomen is soft.     Tenderness: There is no abdominal tenderness. There is no guarding or rebound.  Musculoskeletal:     Right wrist: Swelling present. No crepitus. Normal pulse.     Right hand: Swelling present. Normal capillary refill. Normal pulse.     Cervical back: Normal range of motion and neck supple.  Skin:    General: Skin is warm and dry.     Capillary Refill: Capillary refill takes less than 2 seconds.     Findings: No erythema or rash.  Neurological:     General: No focal deficit present.     Mental Status: She is alert and oriented to person, place, and time.     Deep Tendon Reflexes: Reflexes normal.  Psychiatric:        Mood and Affect: Mood normal.     ED Results / Procedures / Treatments   Labs (all labs ordered are listed, but only abnormal results are displayed) Results for orders placed or performed during the hospital encounter of 01/14/24  CBC with Differential   Collection Time: 01/14/24  5:10 AM  Result Value Ref Range   WBC 11.7 (H) 4.0 - 10.5 K/uL   RBC 3.90 3.87 - 5.11 MIL/uL   Hemoglobin 12.9 12.0 - 15.0 g/dL   HCT 16.1 09.6 - 04.5 %   MCV 105.9 (H) 80.0 - 100.0 fL   MCH 33.1 26.0 - 34.0 pg   MCHC 31.2 30.0 - 36.0 g/dL   RDW 40.9 (H) 81.1 - 91.4 %    Platelets 272 150 - 400 K/uL   nRBC 0.0 0.0 - 0.2 %   Neutrophils Relative % 86 %   Neutro Abs 10.0 (H) 1.7 - 7.7 K/uL   Lymphocytes Relative 7 %   Lymphs Abs 0.8 0.7 - 4.0 K/uL   Monocytes Relative 7 %   Monocytes Absolute 0.8 0.1 - 1.0 K/uL   Eosinophils Relative 0 %   Eosinophils Absolute 0.0 0.0 - 0.5 K/uL   Basophils Relative 0 %   Basophils Absolute 0.0 0.0 - 0.1 K/uL   Immature Granulocytes 0 %  Abs Immature Granulocytes 0.04 0.00 - 0.07 K/uL  Basic metabolic panel   Collection Time: 01/14/24  5:10 AM  Result Value Ref Range   Sodium 135 135 - 145 mmol/L   Potassium 3.7 3.5 - 5.1 mmol/L   Chloride 103 98 - 111 mmol/L   CO2 23 22 - 32 mmol/L   Glucose, Bld 106 (H) 70 - 99 mg/dL   BUN 18 8 - 23 mg/dL   Creatinine, Ser 4.09 0.44 - 1.00 mg/dL   Calcium 8.6 (L) 8.9 - 10.3 mg/dL   GFR, Estimated >81 >19 mL/min   Anion gap 9 5 - 15  Troponin I (High Sensitivity)   Collection Time: 01/14/24  5:10 AM  Result Value Ref Range   Troponin I (High Sensitivity) 15 <18 ng/L   DG Chest Portable 1 View Result Date: 01/14/2024 CLINICAL DATA:  Chest pains. EXAM: PORTABLE CHEST 1 VIEW COMPARISON:  PA and lateral chest 02/23/2017 FINDINGS: The heart size and mediastinal contours are within normal limits. There is calcification in the transverse aorta. A superimposed skin fold simulates a left apicolateral pneumothorax. No pneumothorax is suspected. Both lungs are clear. There is osteopenia, thoracic spondylosis, and interval new reverse left shoulder arthroplasty. IMPRESSION: No evidence of acute chest disease. Aortic atherosclerosis. Osteopenia and degenerative change. Electronically Signed   By: Denman Fischer M.D.   On: 01/14/2024 05:00     EKG EKG Interpretation Date/Time:  Friday Jan 14 2024 04:09:01 EDT Ventricular Rate:  64 PR Interval:  141 QRS Duration:  93 QT Interval:  419 QTC Calculation: 433 R Axis:   66  Text Interpretation: Sinus rhythm Confirmed by Merla Sawka  ([id number]) on 01/14/2024 4:44:36 AM  Radiology DG Chest Portable 1 View Result Date: 01/14/2024 CLINICAL DATA:  Chest pains. EXAM: PORTABLE CHEST 1 VIEW COMPARISON:  PA and lateral chest 02/23/2017 FINDINGS: The heart size and mediastinal contours are within normal limits. There is calcification in the transverse aorta. A superimposed skin fold simulates a left apicolateral pneumothorax. No pneumothorax is suspected. Both lungs are clear. There is osteopenia, thoracic spondylosis, and interval new reverse left shoulder arthroplasty. IMPRESSION: No evidence of acute chest disease. Aortic atherosclerosis. Osteopenia and degenerative change. Electronically Signed   By: Denman Fischer M.D.   On: 01/14/2024 05:00    Procedures Procedures    Medications Ordered in ED Medications  ketorolac  (TORADOL ) 30 MG/ML injection 30 mg (30 mg Intramuscular Given 01/14/24 0412)  alum & mag hydroxide-simeth (MAALOX/MYLANTA) 200-200-20 MG/5ML suspension 30 mL (30 mLs Oral Given 01/14/24 9562)    ED Course/ Medical Decision Making/ A&P                                 Medical Decision Making Patient here for cast removal   Amount and/or Complexity of Data Reviewed External Data Reviewed: notes.    Details: Previous notes reviewed  Labs: ordered.    Details: White count slight elevation 11.7, normal hemoglobin 12.9 , normal platelet count.  Normal sodium 135, normal potassium 3.7, normal creatinine.  Troponin 1 is normal 15  Radiology: ordered and independent interpretation performed.    Details: Negative CXR by me  ECG/medicine tests: ordered and independent interpretation performed. Decision-making details documented in ED Course. Discussion of management or test interpretation with external provider(s): Case d/w Dr. Pryor Browning, splint and one dose of toradol , office appointment at 820   Risk OTC drugs. Prescription drug management. Risk Details: Cast  removed by ortho tech, patient is feeling improved.  Splint  applied.  Right hand is NVI Patient developed SSCP described as heaviness at discharge, no radiation.  No associated symptoms no SOB, no n/v/d.  Labs sent.  Patient is on full dose lovenox  and took tonights dose.      Final Clinical Impression(s) / ED Diagnoses Final diagnoses:  Encounter for cast removal  Precordial pain   Signed out Dr. Zackowski pending delta troponin. If negative to go straight to orthopedics for follow up  Rx / DC Orders ED Discharge Orders     None         Deklyn Gibbon, MD 01/14/24 [id number]

## 2024-01-15 ENCOUNTER — Encounter (HOSPITAL_COMMUNITY): Payer: Self-pay | Admitting: Orthopedic Surgery

## 2024-01-15 DIAGNOSIS — M96841 Postprocedural hematoma of a musculoskeletal structure following other procedure: Secondary | ICD-10-CM | POA: Diagnosis not present

## 2024-01-15 DIAGNOSIS — T148XXA Other injury of unspecified body region, initial encounter: Secondary | ICD-10-CM | POA: Diagnosis not present

## 2024-01-15 LAB — COMPREHENSIVE METABOLIC PANEL WITH GFR
ALT: 23 U/L (ref 0–44)
AST: 23 U/L (ref 15–41)
Albumin: 3.3 g/dL — ABNORMAL LOW (ref 3.5–5.0)
Alkaline Phosphatase: 42 U/L (ref 38–126)
Anion gap: 8 (ref 5–15)
BUN: 16 mg/dL (ref 8–23)
CO2: 22 mmol/L (ref 22–32)
Calcium: 8.3 mg/dL — ABNORMAL LOW (ref 8.9–10.3)
Chloride: 106 mmol/L (ref 98–111)
Creatinine, Ser: 0.61 mg/dL (ref 0.44–1.00)
GFR, Estimated: >60 mL/min (ref >60)
Glucose, Bld: 86 mg/dL (ref 70–99)
Potassium: 3.9 mmol/L (ref 3.5–5.1)
Sodium: 136 mmol/L (ref 135–145)
Total Bilirubin: 0.5 mg/dL (ref 0.0–1.2)
Total Protein: 5.6 g/dL — ABNORMAL LOW (ref 6.5–8.1)

## 2024-01-15 LAB — CBC
HCT: 39.4 % (ref 36.0–46.0)
Hemoglobin: 12.4 g/dL (ref 12.0–15.0)
MCH: 33 pg (ref 26.0–34.0)
MCHC: 31.5 g/dL (ref 30.0–36.0)
MCV: 104.8 fL — ABNORMAL HIGH (ref 80.0–100.0)
Platelets: 232 10*3/uL (ref 150–400)
RBC: 3.76 MIL/uL — ABNORMAL LOW (ref 3.87–5.11)
RDW: 15.8 % — ABNORMAL HIGH (ref 11.5–15.5)
WBC: 8.5 10*3/uL (ref 4.0–10.5)
nRBC: 0 % (ref 0.0–0.2)

## 2024-01-15 LAB — PROTIME-INR
INR: 1 (ref 0.8–1.2)
Prothrombin Time: 13.6 s (ref 11.4–15.2)

## 2024-01-15 LAB — MAGNESIUM: Magnesium: 2 mg/dL (ref 1.7–2.4)

## 2024-01-15 MED ORDER — OXYCODONE HCL 5 MG PO TABS: 5.0000 mg | ORAL_TABLET | Freq: Four times a day (QID) | ORAL | 0 refills | Status: AC | PRN

## 2024-01-15 NOTE — Discharge Summary (Signed)
 Physician Discharge Summary  Erin Vasquez NWG:[id number] DOB: 08-26-1956 DOA: 01/14/2024  PCP: Erin Cruz, MD  Admit date: 01/14/2024 Discharge date: 01/15/2024  Admitted From: Home  Discharge disposition: Home   Recommendations for Outpatient Follow-Up:   Follow up with your primary care provider in one week.  Check CBC, BMP, magnesium  in the next visit Follow-up with Dr. Delmar Ferrara orthopedics in 1 week.  Office to schedule an appointment.  Discharge Diagnosis:   Principal Problem:   Hematoma   Discharge Condition: Improved.  Diet recommendation: Regular.  Wound care: None.  Code status: Full.   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 Of note patient had surgical intervention for right distal radial shaft comminuted fracture and had undergone ORIF. Patient then continued to have swelling pain and was noted to have hematoma so was admitted to the hospital and was noted to have right wrist surgical site hematoma with carpal tunnel syndrome and impending compartment syndrome show patient underwent fasciotomy with hematoma evacuation by hand surgery. Medical team was called in for admission to the hospital for overnight observation.   Hospital Course:   Following conditions were addressed during hospitalization as listed below,  Right wrist surgical site hematoma with carpal tunnel syndrome and impending compartment syndrome.   Status post fasciotomy and hematoma evacuation.   Was observed overnight at the request of hand surgery.  At this time surgery has seen the patient and okay for discharge.  Plan to follow-up with hand  surgery in 1 week.  Possible antiphospholipid antibody syndrome.   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  Had recurrent DVT and thromboembolism in the past.   Had failed DOAC in the past.  On Lovenox  as outpatient.  History of gastric surgery so unable to absorb oral anticoagulants.  Hand surgery okay about the initiating Lovenox .  Patient understands the risk of bleeding..  Seen by hematology at [facility] on 12/24/2023.  Plan is for lifelong anticoagulation.    Elevated BP without any history of hypertension.  Could be secondary to surgery, pain.  Might need antihypertensive if remains elevated after discharge.  Hypothyroidism.  Continue Synthroid .   History of gastric surgery  History of Roux-en-Y gastric bypass    Disposition.  At this time, patient is stable for disposition home with outpatient PCP and orthopedic follow-up.  Medical Consultants:   Hand surgery  Procedures:    Right wrist volar forearm fasciotomy with hematoma evacuation and carpal tunnel release on 5-25. Subjective:   Today, patient was seen and examined at bedside.  Feels okay.  Denies any overt pain, fever, chills, shortness of breath or any bleeding.  Discharge Exam:   Vitals:   01/15/24 0538 01/15/24 0834  BP: (!) 161/80 (!) 152/86  Pulse: 74 85  Resp: 16 18  Temp: 97.8 F (36.6 C) 98.5 F (36.9 C)  SpO2: 97% 99%   Vitals:   01/14/24 1934 01/15/24 0011 01/15/24 0538 01/15/24 0834  BP: (!) 157/79 137/72 (!) 161/80 (!) 152/86  Pulse: 79 76 74 85  Resp: 17 16 16 18   Temp: 99.3 F (37.4 C)  97.8 F (36.6 C) 98.5 F (36.9 C)  TempSrc:  Oral  Oral Oral  SpO2: 94% 95% 97% 99%  Weight:      Height:        General: Alert awake, not in obvious distress HENT: pupils equally reacting to light,  No scleral pallor or icterus noted. Oral mucosa is moist.  Chest:  Clear breath sounds.  Diminished breath sounds bilaterally. No crackles or wheezes.  CVS: S1 &S2 heard.  No murmur.  Regular rate and rhythm. Abdomen: Soft, nontender, nondistended.  Bowel sounds are heard.   Extremities: No lower extremity cyanosis, clubbing or edema..  Right and and forearm on dressing and splint. Capillary refill present on the affected hand Psych: Alert, awake and oriented, normal mood CNS:  No cranial nerve deficits.  Power equal in all extremities.   Skin: Warm and dry.  No rashes noted.  The results of significant diagnostics from this hospitalization (including imaging, microbiology, ancillary and laboratory) are listed below for reference.     Diagnostic Studies:   DG Chest Portable 1 View Result Date: 01/14/2024 CLINICAL DATA:  Chest pains. EXAM: PORTABLE CHEST 1 VIEW COMPARISON:  PA and lateral chest 02/23/2017 FINDINGS: The heart size and mediastinal contours are within normal limits. There is calcification in the transverse aorta. A superimposed skin fold simulates a left apicolateral pneumothorax. No pneumothorax is suspected. Both lungs are clear. There is osteopenia, thoracic spondylosis, and interval new reverse left shoulder arthroplasty. IMPRESSION: No evidence of acute chest disease. Aortic atherosclerosis. Osteopenia and degenerative change. Electronically Signed   By: Denman Fischer M.D.   On: 01/14/2024 05:00     Labs:   Basic Metabolic Panel: Recent Labs  Lab 01/14/24 0510 01/15/24 0712  NA 135 136  K 3.7 3.9  CL 103 106  CO2 23 22  GLUCOSE 106* 86  BUN 18 16  CREATININE 0.56 0.61  CALCIUM 8.6* 8.3*  MG  --  2.0   GFR Estimated Creatinine Clearance: 61.2 mL/min (by C-G formula based on SCr of 0.61 mg/dL). Liver Function Tests: Recent Labs  Lab 01/15/24 0712  AST 23  ALT 23  ALKPHOS 42  BILITOT 0.5  PROT 5.6*  ALBUMIN 3.3*   No results for input(s): "LIPASE", "AMYLASE" in the last 168 hours. No results for input(s): "AMMONIA" in the last 168 hours. Coagulation profile Recent Labs  Lab 01/15/24 0712  INR 1.0    CBC: Recent  Labs  Lab 01/14/24 0510 01/15/24 0712  WBC 11.7* 8.5  NEUTROABS 10.0*  --   HGB 12.9 12.4  HCT 41.3 39.4  MCV 105.9* 104.8*  PLT 272 232   Cardiac Enzymes: No results for input(s): "CKTOTAL", "CKMB", "CKMBINDEX", "TROPONINI" in the last 168 hours. BNP: Invalid input(s): "POCBNP" CBG: No results for input(s): "GLUCAP" in the last 168 hours. D-Dimer No results for input(s): "DDIMER" in the last 72 hours. Hgb A1c No results for input(s): "HGBA1C" in the last 72 hours. Lipid Profile No results for input(s): "CHOL", "HDL", "LDLCALC", "TRIG", "CHOLHDL", "LDLDIRECT" in the last 72 hours. Thyroid function studies No results for input(s): "TSH", "T4TOTAL", "T3FREE", "THYROIDAB" in the last 72 hours.  Invalid input(s): "FREET3" Anemia work up No results for input(s): "VITAMINB12", "FOLATE", "FERRITIN", "TIBC", "IRON", "RETICCTPCT" in the last 72 hours. Microbiology No results found for this or any previous visit (from the past 240 hours).   Discharge Instructions:   Discharge Instructions     Call MD for:  redness, tenderness, or signs of infection (pain, swelling, redness, odor or green/yellow discharge around incision site)  Complete by: As directed    Call MD for:  severe uncontrolled pain   Complete by: As directed    Diet general   Complete by: As directed    Discharge instructions   Complete by: As directed    Follow-up with hand surgery as  scheduled with clinic.  Seek medical attention for worsening symptoms.  Keep the affected hand elevated.  Follow-up with your hematology for blood clot issues as scheduled by the clinic.   Increase activity slowly   Complete by: As directed       Allergies as of 01/15/2024       Reactions   Bee Venom Swelling, Rash, Other (See Comments)   Patient is allergic to bee venom, mosquitos, wasps, yellow jackets and bees. Localized swelling Fever     Hymenoptera Venom Preparations Rash, Other (See Comments)   Patient is allergic to  bee venom, mosquitos, wasps, yellow jackets and bees. Localized swelling Fever   Other Anaphylaxis, Shortness Of Breath, Itching, Rash   HICKORY SMOKE   Penicillins Anaphylaxis, Other (See Comments)   Did it involve swelling of the face/tongue/throat, SOB, or low BP? Yes Did it involve sudden or severe rash/hives, skin peeling, or any reaction on the inside of your mouth or nose? No Did you need to seek medical attention at a hospital or doctor's office? Yes When did it last happen?      childhood allergy If all above answers are "NO", may proceed with cephalosporin use.   Latex Rash   Latex rash due to elastic in zip-up TED hose at home (local rash), pt. Has had flu vaccine before without problems.   Morphine  And Codeine Itching, Swelling   Plastibase Rash   Tangerine Flavoring Agent (non-screening) Rash        Medication List     STOP taking these medications    clindamycin  300 MG capsule Commonly known as: CLEOCIN    HYDROcodone -acetaminophen  10-325 MG tablet Commonly known as: NORCO   meloxicam  15 MG tablet Commonly known as: MOBIC    methylPREDNISolone  acetate 40 MG/ML injection Commonly known as: DEPO-MEDROL    ondansetron  4 MG tablet Commonly known as: Zofran    sulfamethoxazole-trimethoprim 800-160 MG tablet Commonly known as: BACTRIM DS       TAKE these medications    CALTRATE 600+D PO Take 1 tablet by mouth daily.   diclofenac Sodium 1 % Gel Commonly known as: VOLTAREN   enoxaparin  80 MG/0.8ML injection Commonly known as: LOVENOX  Inject 80 mg into the skin.   famotidine  20 MG tablet Commonly known as: PEPCID  Take 20 mg by mouth daily.   fluticasone  0.05 % cream Commonly known as: CUTIVATE  Apply 1 application topically 2 (two) times daily as needed (rash).   levothyroxine  50 MCG tablet Commonly known as: SYNTHROID  Take 50 mcg by mouth daily before breakfast.   multivitamin with minerals Tabs tablet Take 1 tablet by mouth 2 (two) times  daily.   omeprazole 40 MG capsule Commonly known as: PRILOSEC Take 40 mg by mouth every evening.   ondansetron  4 MG disintegrating tablet Commonly known as: ZOFRAN -ODT Take 1 tablet (4 mg total) by mouth every 8 (eight) hours as needed for nausea or vomiting.   oxyCODONE  5 MG immediate release tablet Commonly known as: Roxicodone  Take 1 tablet (5 mg total) by mouth every 6 (six) hours as needed.   pseudoephedrine 120 MG 12 hr tablet Commonly known as: SUDAFED Take 120 mg by mouth every 12 (twelve) hours as needed for congestion.  triamcinolone ointment 0.5 % Commonly known as: KENALOG Apply 1 application topically 2 (two) times daily as needed (rash).   Vitamin D  50 MCG (2000 UT) tablet Take 2,000 Units by mouth daily.        Follow-up Information     Erin Rummer, MD Follow up in 1 week(s).   Specialty: Orthopedic Surgery Contact information: [address] [place] [address] [phone]         Erin Cruz, MD Follow up in 1 week(s).   Specialty: Family Medicine Contact information: [address] [place] [address] [phone]                  Time coordinating discharge: 39 minutes  Signed:  Bionca Vasquez  [facility] 01/15/2024, 11:52 AM

## 2024-01-15 NOTE — Progress Notes (Signed)
  Orthopedic Hand Surgery Progress Note:   POD #1 Right wrist volar forearm fasciotomy Right wrist surgical site hematoma evacuation Right carpal tunnel release Right thumb flexor pollicis longus flexor tendon sheath release and decompression  Pain significantly improved. Sensation gradually improving, states she can feel her thumb but not the other digits. Overall, she is doing better.  Physical Exam: Right Upper Extremity Post op dressing C/D/I Able to gently flex/extend all digits although limited due to pain  Minimal swelling in the fingers, no swelling in the upper forearm Decreased sensation in index, middle, ring and small fingers Fingers warm and well perfused    Assessment/Plan: - Significant improvements s/p above procedure  - Encouraged aggressive finger range of motion, elevation above the heart, can weight bear through elbow  - Keep post op dressing on and clean - Plan to resume anticoagulation now, she understands she is high risk for re-bleed - Emphasized importance of early ambulation and activity  - Ok for d/c today from hand surgery standpoint - Follow up in office early next week    Prentiss Brocks PA-C  Orthopedic Hand Surgery [facility] Office number: [phone] [address], [address] [place], [place] [address]

## 2024-01-15 NOTE — Plan of Care (Signed)

## 2024-01-18 ENCOUNTER — Other Ambulatory Visit: Payer: Self-pay | Admitting: Medical Oncology

## 2024-01-18 DIAGNOSIS — T148XXA Other injury of unspecified body region, initial encounter: Secondary | ICD-10-CM | POA: Diagnosis not present

## 2024-01-18 DIAGNOSIS — E559 Vitamin D deficiency, unspecified: Secondary | ICD-10-CM | POA: Diagnosis not present

## 2024-01-18 DIAGNOSIS — I10 Essential (primary) hypertension: Secondary | ICD-10-CM | POA: Diagnosis not present

## 2024-01-18 DIAGNOSIS — D6859 Other primary thrombophilia: Secondary | ICD-10-CM | POA: Diagnosis not present

## 2024-01-18 DIAGNOSIS — Z86718 Personal history of other venous thrombosis and embolism: Secondary | ICD-10-CM

## 2024-01-19 ENCOUNTER — Telehealth: Payer: Self-pay | Admitting: Hematology and Oncology

## 2024-01-19 ENCOUNTER — Inpatient Hospital Stay

## 2024-01-19 ENCOUNTER — Telehealth: Payer: Self-pay | Admitting: Medical Oncology

## 2024-01-19 ENCOUNTER — Inpatient Hospital Stay: Attending: Internal Medicine | Admitting: Internal Medicine

## 2024-01-19 VITALS — BP 155/78 | HR 68 | Temp 97.4°F | Resp 17 | Ht 63.0 in | Wt 142.0 lb

## 2024-01-19 DIAGNOSIS — Z7901 Long term (current) use of anticoagulants: Secondary | ICD-10-CM | POA: Diagnosis not present

## 2024-01-19 DIAGNOSIS — Z86718 Personal history of other venous thrombosis and embolism: Secondary | ICD-10-CM | POA: Diagnosis present

## 2024-01-19 DIAGNOSIS — Z801 Family history of malignant neoplasm of trachea, bronchus and lung: Secondary | ICD-10-CM

## 2024-01-19 DIAGNOSIS — Z86711 Personal history of pulmonary embolism: Secondary | ICD-10-CM | POA: Diagnosis not present

## 2024-01-19 DIAGNOSIS — R200 Anesthesia of skin: Secondary | ICD-10-CM

## 2024-01-19 DIAGNOSIS — D500 Iron deficiency anemia secondary to blood loss (chronic): Secondary | ICD-10-CM

## 2024-01-19 DIAGNOSIS — Z8542 Personal history of malignant neoplasm of other parts of uterus: Secondary | ICD-10-CM

## 2024-01-19 DIAGNOSIS — Z8781 Personal history of (healed) traumatic fracture: Secondary | ICD-10-CM

## 2024-01-19 DIAGNOSIS — Z79899 Other long term (current) drug therapy: Secondary | ICD-10-CM | POA: Insufficient documentation

## 2024-01-19 DIAGNOSIS — I2782 Chronic pulmonary embolism: Secondary | ICD-10-CM

## 2024-01-19 DIAGNOSIS — Z8679 Personal history of other diseases of the circulatory system: Secondary | ICD-10-CM

## 2024-01-19 LAB — CBC WITH DIFFERENTIAL (CANCER CENTER ONLY)
Abs Immature Granulocytes: 0.02 10*3/uL (ref 0.00–0.07)
Basophils Absolute: 0.1 10*3/uL (ref 0.0–0.1)
Basophils Relative: 1 %
Eosinophils Absolute: 0.1 10*3/uL (ref 0.0–0.5)
Eosinophils Relative: 3 %
HCT: 40.2 % (ref 36.0–46.0)
Hemoglobin: 13.1 g/dL (ref 12.0–15.0)
Immature Granulocytes: 0 %
Lymphocytes Relative: 20 %
Lymphs Abs: 1 10*3/uL (ref 0.7–4.0)
MCH: 33.3 pg (ref 26.0–34.0)
MCHC: 32.6 g/dL (ref 30.0–36.0)
MCV: 102.3 fL — ABNORMAL HIGH (ref 80.0–100.0)
Monocytes Absolute: 0.3 10*3/uL (ref 0.1–1.0)
Monocytes Relative: 6 %
Neutro Abs: 3.4 10*3/uL (ref 1.7–7.7)
Neutrophils Relative %: 70 %
Platelet Count: 262 10*3/uL (ref 150–400)
RBC: 3.93 MIL/uL (ref 3.87–5.11)
RDW: 14.8 % (ref 11.5–15.5)
WBC Count: 4.9 10*3/uL (ref 4.0–10.5)
nRBC: 0 % (ref 0.0–0.2)

## 2024-01-19 LAB — CMP (CANCER CENTER ONLY)
ALT: 29 U/L (ref 0–44)
AST: 28 U/L (ref 15–41)
Albumin: 4.4 g/dL (ref 3.5–5.0)
Alkaline Phosphatase: 49 U/L (ref 38–126)
Anion gap: 8 (ref 5–15)
BUN: 16 mg/dL (ref 8–23)
CO2: 27 mmol/L (ref 22–32)
Calcium: 9.1 mg/dL (ref 8.9–10.3)
Chloride: 106 mmol/L (ref 98–111)
Creatinine: 0.72 mg/dL (ref 0.44–1.00)
GFR, Estimated: >60 mL/min (ref >60)
Glucose, Bld: 81 mg/dL (ref 70–99)
Potassium: 3.7 mmol/L (ref 3.5–5.1)
Sodium: 141 mmol/L (ref 135–145)
Total Bilirubin: 0.5 mg/dL (ref 0.0–1.2)
Total Protein: 7.1 g/dL (ref 6.5–8.1)

## 2024-01-19 NOTE — Progress Notes (Signed)
 [facility] [phone]   [phone]  CONSULT NOTE  REFERRING PHYSICIAN: Dr. Maximo Spar  REASON FOR CONSULTATION:  68 years old white female with very complicated history of deep venous thrombosis and pulmonary embolus  HPI Erin Vasquez is a 68 y.o. female Discussed the use of AI scribe software for clinical note transcription with the patient, who gave verbal consent to proceed.  History of Present Illness   Erin Vasquez "Erin Vasquez" is a 68 year old female with recurrent blood clots who presents for evaluation of her anticoagulation therapy.  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 Her initial DVT occurred in the left lower extremity in 2017, treated with Xarelto  for six months. Following a left knee replacement, she experienced another clot and resumed Xarelto , which was intended for lifelong use. However, she developed another clot while on Xarelto , leading to a switch to Eliquis. Despite adherence to Eliquis, she experienced another clot within the last six to eight months, prompting a switch to Lovenox  injections twice daily.  She reports difficulty administering Lovenox  due to numbness in her fingers, which complicates pinching the skin for injections. She also experienced a significant bleeding episode in March 2025, resulting in a hematoma in her left leg, a hemoglobin drop from 12 to 4, and a 14-day hospital stay where she received seven units of blood. During this time, she was treated with warfarin and Lovenox .  She has a history of lupus anticoagulant, diagnosed around the time of her bariatric surgery in March 2018 but she was on anticoagulation at the time of the test. Her medical history also includes anemia, arthritis, anxiety, acid reflux, hypothyroidism, poor circulation, and resolved sleep apnea following weight loss. She had uterine cancer, treated with a total hysterectomy without  chemotherapy or radiation.  Her family history includes a mother with a transient ischemic attack, a father with a heart attack, and a maternal grandmother with lung cancer. Socially, she is divorced, has one son, five grandchildren, and previously worked as a Technical sales engineer. She does not smoke, use drugs, and has minimal alcohol history. She is allergic to penicillin and morphine .  She experiences numbness in her fingers since a hospital stay in March 2025, possibly related to a tourniquet left on her arm for 12 days. She also experiences frequent bruising and swelling, with a recent incident leading to emergency surgery to evacuate a hematoma from her arm. She is concerned about the potential for amputation due to her condition.      HPI  Past Medical History:  Diagnosis Date   Anemia    during pregnancy   Anxiety    Arthritis    "all over" (07/07/2017)   Bilateral pulmonary embolism (HCC) 10/29/2015   "from my left ankle"   Childhood asthma    as child, nothing as adult   Chronic back pain    "all over" (07/07/2017)   DVT (deep venous thrombosis) (HCC) 10/29/2015   "from my left ankle"   GERD (gastroesophageal reflux disease)    tx. Tums   H/O hiatal hernia    "repaired w/gastric bypass OR" (07/07/2017)   History of esophageal dilatation    Hypothyroidism    Lupus anticoagulant disorder (HCC)    Dr. Dirk Fredericks    Neuromuscular disorder (HCC)    L thigh - Numbness, nerve damage     Peripheral vascular disease (HCC)    Sacroiliac pain    "  left thigh has been numb since 01/2016 after appendectomy" (07/07/2017)   Sleep apnea    07/07/2017 "bariatric dr says I don't need CPAP anymore" (07/07/2017)   Thyroid disease    Uterine cancer (HCC) 01/2014   Varicose veins    Ventral hernia    Vitamin D  deficiency     Past Surgical History:  Procedure Laterality Date   ABDOMINAL HYSTERECTOMY  01/2014   "robotic"   APPENDECTOMY     ruptured 01/2016   BALLOON  DILATION N/A 08/02/2013   Procedure: BALLOON DILATION;  Surgeon: Evangeline Hilts, MD;  Location: [facility];  Service: Endoscopy;  Laterality: N/A;   BUNIONECTOMY WITH HAMMERTOE RECONSTRUCTION Left ~ 2005   CARPAL TUNNEL RELEASE Right 01/14/2024   Procedure: RIGHT CARPAL TUNNEL RELEASE;  Surgeon: Ltanya Rummer, MD;  Location: [facility];  Service: Orthopedics;  Laterality: Right;   COLONOSCOPY WITH PROPOFOL  N/A 08/02/2013   Procedure: COLONOSCOPY WITH PROPOFOL ;  Surgeon: Evangeline Hilts, MD;  Location: [facility];  Service: Endoscopy;  Laterality: N/A;   DILATION AND CURETTAGE OF UTERUS  x2   S/P miscarriage   ESOPHAGOGASTRODUODENOSCOPY (EGD) WITH ESOPHAGEAL DILATION  ~ 2003   ESOPHAGOGASTRODUODENOSCOPY (EGD) WITH PROPOFOL  N/A 08/02/2013   Procedure: ESOPHAGOGASTRODUODENOSCOPY (EGD) WITH PROPOFOL ;  Surgeon: Evangeline Hilts, MD;  Location: [facility];  Service: Endoscopy;  Laterality: N/A;   FASCIECTOMY, UPPER EXTREMITY Right 01/14/2024   Procedure: FASCIECTOMY;  Surgeon: Ltanya Rummer, MD;  Location: [facility] OR;  Service: Orthopedics;  Laterality: Right;   HERNIA REPAIR  04/2017   VHR   INCISION AND DRAINAGE OF WOUND Right 01/14/2024   Procedure: RIGHT WRIST IRRIGATION AND DEBRIDEMENT;  Surgeon: Ltanya Rummer, MD;  Location: [facility];  Service: Orthopedics;  Laterality: Right;   INSERTION OF MESH N/A 04/28/2017   Procedure: POSSIBLE INSERTION OF MESH;  Surgeon: Oza Blumenthal, MD;  Location: [facility];  Service: General;  Laterality: N/A;   JOINT REPLACEMENT     LAPAROSCOPIC APPENDECTOMY N/A 02/07/2016   Procedure: LAPAROSCOPIC APPENDECTOMY;  Surgeon: Oza Blumenthal, MD;  Location: [facility];  Service: General;  Laterality: N/A;   OPEN REDUCTION INTERNAL FIXATION (ORIF) DISTAL RADIAL FRACTURE Right 12/28/2023   Procedure: OPEN REDUCTION INTERNAL FIXATION (ORIF) DISTAL RADIUS FRACTURE;  Surgeon: Saundra Curl, MD;  Location: [facility];  Service: Orthopedics;  Laterality: Right;   ROBOTIC ASSISTED TOTAL  HYSTERECTOMY WITH BILATERAL SALPINGO OOPHERECTOMY  01/23/2014   uterine cancer   ROUX-EN-Y GASTRIC BYPASS  11/2016   [facility]   SHOULDER ARTHROSCOPY WITH DEBRIDEMENT AND BICEP TENDON REPAIR Left 09/11/2014   Procedure: SHOULDER ARTHROSCOPY WITH DEBRIDEMENT AND BICEP TENDON REPAIR;  Surgeon: Saundra Curl, MD;  Location: [facility];  Service: Orthopedics;  Laterality: Left;   SHOULDER ARTHROSCOPY WITH DISTAL CLAVICLE RESECTION Left 09/11/2014   Procedure: SHOULDER ARTHROSCOPY WITH DISTAL CLAVICLE RESECTION;  Surgeon: Saundra Curl, MD;  Location: [facility];  Service: Orthopedics;  Laterality: Left;   SHOULDER ARTHROSCOPY WITH SUBACROMIAL DECOMPRESSION Left 09/11/2014   Procedure: SHOULDER ARTHROSCOPY WITH SUBACROMIAL DECOMPRESSION;  Surgeon: Saundra Curl, MD;  Location: [facility];  Service: Orthopedics;  Laterality: Left;   TONSILLECTOMY     TOTAL KNEE ARTHROPLASTY Right 07/06/2017   TOTAL KNEE ARTHROPLASTY Right 07/06/2017   Procedure: RIGHT TOTAL KNEE ARTHROPLASTY;  Surgeon: Saundra Curl, MD;  Location[facility];  Service: Orthopedics;  Laterality: Right;   TOTAL KNEE ARTHROPLASTY Left 04/19/2018   Procedure: LEFT TOTAL KNEE ARTHROPLASTY;  Surgeon: Saundra Curl, MD;  Location: [facility] OR;  Service: Orthopedics;  Laterality: Left;   TOTAL SHOULDER ARTHROPLASTY Left 04/04/2019   Procedure: TOTAL SHOULDER ARTHROPLASTY reverse;  Surgeon: Saundra Curl, MD;  Location: [facility];  Service: Orthopedics;  Laterality: Left;   VENTRAL HERNIA REPAIR N/A 04/28/2017   Procedure: HERNIA REPAIR VENTRAL ADULT;  Surgeon: Oza Blumenthal, MD;  Location: [facility] OR;  Service: General;  Laterality: N/A;    Family History  Problem Relation Age of Onset   Transient ischemic attack Mother    Heart attack Father    Diabetes type II Other    Lung cancer Maternal Grandmother     Social History Social History   Tobacco Use   Smoking status: Never   Smokeless tobacco: Former    Types: Chew   Tobacco  comments:    "chewed as a teenSystems developer   Vaping status: Never Used  Substance Use Topics   Alcohol use: Never   Drug use: No    Allergies  Allergen Reactions   Bee Venom Swelling, Rash and Other (See Comments)    Patient is allergic to bee venom, mosquitos, wasps, yellow jackets and bees. Localized swelling Fever     Hymenoptera Venom Preparations Rash and Other (See Comments)    Patient is allergic to bee venom, mosquitos, wasps, yellow jackets and bees. Localized swelling Fever   Other Anaphylaxis, Shortness Of Breath, Itching and Rash    HICKORY SMOKE    Penicillins Anaphylaxis and Other (See Comments)    Did it involve swelling of the face/tongue/throat, SOB, or low BP? Yes Did it involve sudden or severe rash/hives, skin peeling, or any reaction on the inside of your mouth or nose? No Did you need to seek medical attention at a hospital or doctor's office? Yes When did it last happen?      childhood allergy If all above answers are "NO", may proceed with cephalosporin use.    Latex Rash    Latex rash due to elastic in zip-up TED hose at home (local rash), pt. Has had flu vaccine before without problems.   Morphine  And Codeine Itching and Swelling   Plastibase Rash   Tangerine Flavoring Agent (Non-Screening) Rash    Current Outpatient Medications  Medication Sig Dispense Refill   Calcium Carbonate-Vitamin D  (CALTRATE 600+D PO) Take 1 tablet by mouth daily.     Cholecalciferol  (VITAMIN D ) 50 MCG (2000 UT) tablet Take 2,000 Units by mouth daily.     diclofenac Sodium (VOLTAREN) 1 % GEL      enoxaparin  (LOVENOX ) 80 MG/0.8ML injection Inject 80 mg into the skin.     famotidine  (PEPCID ) 20 MG tablet Take 20 mg by mouth daily.      fluticasone  (CUTIVATE ) 0.05 % cream Apply 1 application topically 2 (two) times daily as needed (rash).     levothyroxine  (SYNTHROID , LEVOTHROID) 50 MCG tablet Take 50 mcg by mouth daily before breakfast.      Multiple Vitamin (MULTIVITAMIN  WITH MINERALS) TABS tablet Take 1 tablet by mouth 2 (two) times daily.      omeprazole (PRILOSEC) 40 MG capsule Take 40 mg by mouth every evening.      ondansetron  (ZOFRAN -ODT) 4 MG disintegrating tablet Take 1 tablet (4 mg total) by mouth every 8 (eight) hours as needed for nausea or vomiting. 15 tablet 0   oxyCODONE  (ROXICODONE ) 5 MG immediate release tablet Take 1 tablet (5 mg total) by mouth every 6 (six) hours as needed. 15 tablet 0   pseudoephedrine (SUDAFED) 120 MG 12 hr tablet Take  120 mg by mouth every 12 (twelve) hours as needed for congestion.     triamcinolone ointment (KENALOG) 0.5 % Apply 1 application topically 2 (two) times daily as needed (rash).     No current facility-administered medications for this visit.    Review of Systems  Constitutional: positive for fatigue Eyes: negative Ears, nose, mouth, throat, and face: negative Respiratory: negative Cardiovascular: negative Gastrointestinal: negative Genitourinary:negative Integument/breast: negative Hematologic/lymphatic: positive for easy bruising Musculoskeletal:negative Neurological: negative Behavioral/Psych: negative Endocrine: negative Allergic/Immunologic: negative  Physical Exam  FAO:ZHYQM, healthy, no distress, well nourished, and well developed SKIN: skin color, texture, turgor are normal, no rashes or significant lesions HEAD: Normocephalic, No masses, lesions, tenderness or abnormalities EYES: normal, PERRLA, Conjunctiva are pink and non-injected EARS: External ears normal, Canals clear OROPHARYNX:no exudate, no erythema, and lips, buccal mucosa, and tongue normal  NECK: supple, no adenopathy, no JVD LYMPH:  no palpable lymphadenopathy, no hepatosplenomegaly BREAST:not examined LUNGS: clear to auscultation , and palpation HEART: regular rate & rhythm, no murmurs, and no gallops ABDOMEN:abdomen soft, non-tender, normal bowel sounds, and no masses or organomegaly BACK: Back symmetric, no  curvature., No CVA tenderness EXTREMITIES:no joint deformities, effusion, or inflammation, no edema  NEURO: alert & oriented x 3 with fluent speech, no focal motor/sensory deficits  PERFORMANCE STATUS: ECOG 1  LABORATORY DATA: Lab Results  Component Value Date   WBC 4.9 01/19/2024   HGB 13.1 01/19/2024   HCT 40.2 01/19/2024   MCV 102.3 (H) 01/19/2024   PLT 262 01/19/2024      Chemistry      Component Value Date/Time   NA 141 01/19/2024 1056   K 3.7 01/19/2024 1056   CL 106 01/19/2024 1056   CO2 27 01/19/2024 1056   BUN 16 01/19/2024 1056   CREATININE 0.72 01/19/2024 1056   CREATININE 0.68 09/20/2018 1123      Component Value Date/Time   CALCIUM 9.1 01/19/2024 1056   ALKPHOS 49 01/19/2024 1056   AST 28 01/19/2024 1056   ALT 29 01/19/2024 1056   BILITOT 0.5 01/19/2024 1056       RADIOGRAPHIC STUDIES: DG Chest Portable 1 View Result Date: 01/14/2024 CLINICAL DATA:  Chest pains. EXAM: PORTABLE CHEST 1 VIEW COMPARISON:  PA and lateral chest 02/23/2017 FINDINGS: The heart size and mediastinal contours are within normal limits. There is calcification in the transverse aorta. A superimposed skin fold simulates a left apicolateral pneumothorax. No pneumothorax is suspected. Both lungs are clear. There is osteopenia, thoracic spondylosis, and interval new reverse left shoulder arthroplasty. IMPRESSION: No evidence of acute chest disease. Aortic atherosclerosis. Osteopenia and degenerative change. Electronically Signed   By: Denman Fischer M.D.   On: 01/14/2024 05:00    ASSESSMENT AND PLAN: Assessment and Plan    Recurrent deep vein thrombosis and pulmonary embolism Recurrent DVT and PE, initially diagnosed in 2017 in the left lower extremity. Subsequent episodes occurred despite anticoagulation with Xarelto  and Eliquis, likely due to malabsorption post-bariatric surgery. Recent DVT and PE within the last 6-8 months while on Eliquis, now managed with Lovenox . High risk of clotting  without anticoagulation, but also risk of bleeding complications with anticoagulation. Current management with weight-based Lovenox  requires specialized care. She was managed until recently by Dr. Cornelia Dieter at [facility] but wanted to change provider to [facility] - Continue Lovenox  injections. - Refer to Dr. Nancye Azure, a blood clotting expert in [facility], for further evaluation and management. - I will also schedule her to see one of my hematology partner,  Dr. Rosaline Coma for local care.  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 Lupus anticoagulant syndrome, potentially contributing to recurrent thrombotic events. Diagnosis possibly related to bariatric surgery in 2018.  Anemia due to blood loss Severe anemia due to bleeding from a hematoma in the left leg, with hemoglobin dropping from 12 to 4, requiring 7 units of blood transfusion. Anemia likely secondary to anticoagulation therapy.  Hematoma of left leg Hematoma in the left leg leading to significant blood loss and hospitalization, likely related to anticoagulation therapy.  Fracture of right arm Fracture of the right arm due to a fall after syncope from pain related to the hematoma, requiring surgical intervention.  Numbness of fingers Numbness of fingers possibly related to prolonged tourniquet application during hospitalization, persistent since the incident.  History of uterine cancer Uterine cancer with total hysterectomy. No chemotherapy or radiation required. Currently no active issues related to uterine cancer.  Goals of Care She desires to understand her complex medical condition and avoid amputation. Open to being studied to help others with similar conditions. Seeks a hematologist with expertise in clotting disorders for effective management.   The patient was advised to call immediately if she has any concerning symptoms in the interval.  The patient voices  understanding of current disease status and treatment options and is in agreement with the current care plan.  All questions were answered. The patient knows to call the clinic with any problems, questions or concerns. We can certainly see the patient much sooner if necessary.  Thank you so much for allowing me to participate in the care of DEJANA SEAS. I will continue to follow up the patient with you and assist in her care. The total time spent in the appointment was 60 minutes.  Disclaimer: This note was dictated with voice recognition software. Similar sounding words can inadvertently be transcribed and may not be corrected upon review.   Aurelio Blower Jan 19, 2024, 12:13 PM

## 2024-01-19 NOTE — Telephone Encounter (Signed)
 Referral and records faxed to Dr Loretta Romp, [facility].

## 2024-01-20 ENCOUNTER — Telehealth: Payer: Self-pay | Admitting: Hematology and Oncology

## 2024-01-21 DIAGNOSIS — E039 Hypothyroidism, unspecified: Secondary | ICD-10-CM | POA: Diagnosis not present

## 2024-01-21 DIAGNOSIS — D6862 Lupus anticoagulant syndrome: Secondary | ICD-10-CM | POA: Diagnosis not present

## 2024-01-21 DIAGNOSIS — I10 Essential (primary) hypertension: Secondary | ICD-10-CM | POA: Diagnosis not present

## 2024-01-21 DIAGNOSIS — M5442 Lumbago with sciatica, left side: Secondary | ICD-10-CM | POA: Diagnosis not present

## 2024-01-21 DIAGNOSIS — G629 Polyneuropathy, unspecified: Secondary | ICD-10-CM | POA: Diagnosis not present

## 2024-01-21 DIAGNOSIS — B9620 Unspecified Escherichia coli [E. coli] as the cause of diseases classified elsewhere: Secondary | ICD-10-CM | POA: Diagnosis not present

## 2024-01-21 DIAGNOSIS — K5904 Chronic idiopathic constipation: Secondary | ICD-10-CM | POA: Diagnosis not present

## 2024-01-21 DIAGNOSIS — D649 Anemia, unspecified: Secondary | ICD-10-CM | POA: Diagnosis not present

## 2024-01-21 DIAGNOSIS — D62 Acute posthemorrhagic anemia: Secondary | ICD-10-CM | POA: Diagnosis not present

## 2024-01-21 DIAGNOSIS — S52331D Displaced oblique fracture of shaft of right radius, subsequent encounter for closed fracture with routine healing: Secondary | ICD-10-CM | POA: Diagnosis not present

## 2024-01-21 DIAGNOSIS — M7981 Nontraumatic hematoma of soft tissue: Secondary | ICD-10-CM | POA: Diagnosis not present

## 2024-01-21 DIAGNOSIS — N390 Urinary tract infection, site not specified: Secondary | ICD-10-CM | POA: Diagnosis not present

## 2024-01-31 ENCOUNTER — Telehealth: Payer: Self-pay | Admitting: Podiatry

## 2024-02-01 ENCOUNTER — Telehealth: Payer: Self-pay | Admitting: Physical Therapy

## 2024-02-01 NOTE — Telephone Encounter (Signed)
 LVM for patient to call our clinic and to address treatment questions.   Alexiz Cothran PT, DPT, LAT, ATC  02/01/24  9:46 AM

## 2024-02-04 ENCOUNTER — Inpatient Hospital Stay (HOSPITAL_BASED_OUTPATIENT_CLINIC_OR_DEPARTMENT_OTHER): Admitting: Hematology and Oncology

## 2024-02-04 ENCOUNTER — Other Ambulatory Visit: Payer: Self-pay | Admitting: Hematology and Oncology

## 2024-02-04 ENCOUNTER — Inpatient Hospital Stay

## 2024-02-04 VITALS — BP 143/91 | HR 73 | Temp 97.2°F | Resp 14 | Wt 140.0 lb

## 2024-02-04 DIAGNOSIS — Z86718 Personal history of other venous thrombosis and embolism: Secondary | ICD-10-CM | POA: Diagnosis not present

## 2024-02-04 DIAGNOSIS — I2782 Chronic pulmonary embolism: Secondary | ICD-10-CM | POA: Diagnosis not present

## 2024-02-04 LAB — CBC WITH DIFFERENTIAL (CANCER CENTER ONLY)
Abs Immature Granulocytes: 0.07 10*3/uL (ref 0.00–0.07)
Basophils Absolute: 0.1 10*3/uL (ref 0.0–0.1)
Basophils Relative: 2 %
Eosinophils Absolute: 0.1 10*3/uL (ref 0.0–0.5)
Eosinophils Relative: 3 %
HCT: 41.7 % (ref 36.0–46.0)
Hemoglobin: 13.5 g/dL (ref 12.0–15.0)
Immature Granulocytes: 1 %
Lymphocytes Relative: 28 %
Lymphs Abs: 1.5 10*3/uL (ref 0.7–4.0)
MCH: 32 pg (ref 26.0–34.0)
MCHC: 32.4 g/dL (ref 30.0–36.0)
MCV: 98.8 fL (ref 80.0–100.0)
Monocytes Absolute: 0.4 10*3/uL (ref 0.1–1.0)
Monocytes Relative: 7 %
Neutro Abs: 3 10*3/uL (ref 1.7–7.7)
Neutrophils Relative %: 59 %
Platelet Count: 366 10*3/uL (ref 150–400)
RBC: 4.22 MIL/uL (ref 3.87–5.11)
RDW: 13.7 % (ref 11.5–15.5)
WBC Count: 5.1 10*3/uL (ref 4.0–10.5)
nRBC: 0 % (ref 0.0–0.2)

## 2024-02-04 LAB — CMP (CANCER CENTER ONLY)
ALT: 21 U/L (ref 0–44)
AST: 19 U/L (ref 15–41)
Albumin: 4.5 g/dL (ref 3.5–5.0)
Alkaline Phosphatase: 48 U/L (ref 38–126)
Anion gap: 6 (ref 5–15)
BUN: 16 mg/dL (ref 8–23)
CO2: 27 mmol/L (ref 22–32)
Calcium: 9.1 mg/dL (ref 8.9–10.3)
Chloride: 106 mmol/L (ref 98–111)
Creatinine: 0.67 mg/dL (ref 0.44–1.00)
GFR, Estimated: >60 mL/min (ref >60)
Glucose, Bld: 86 mg/dL (ref 70–99)
Potassium: 4.1 mmol/L (ref 3.5–5.1)
Sodium: 139 mmol/L (ref 135–145)
Total Bilirubin: 0.3 mg/dL (ref 0.0–1.2)
Total Protein: 7.2 g/dL (ref 6.5–8.1)

## 2024-02-04 MED ORDER — ENOXAPARIN SODIUM 60 MG/0.6ML IJ SOSY: 60.0000 mg | PREFILLED_SYRINGE | Freq: Two times a day (BID) | INTRAMUSCULAR | 0 refills | Status: DC

## 2024-02-05 NOTE — Progress Notes (Signed)
 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 Her initial DVT occurred in the left lower extremity in 2017, treated with Xarelto  for six months. Following a left knee replacement, she experienced another clot and resumed Xarelto , which was intended for lifelong use. However, she developed another clot while on Xarelto , leading to a switch to Eliquis. Despite adherence to Eliquis, she experienced another clot within the last six to eight months, prompting a switch to Lovenox  injections twice daily.   Interval History:  Erin Vasquez 68 y.o. female with medical history significant for recurrent thromboembolism and failure of Eliquis and Xarelto  who presents for a follow up visit. The patient's last visit was on 01/19/2024 with Dr. Marguerita Shih. In the interim since the last visit she is continued on Lovenox  therapy.  On exam today Erin Vasquez reports that she is tolerating her Lovenox  therapy well.  She is taking twice daily as prescribed.  She reports it is not causing any bleeding, recent, or dark stools.  She notes that she is able to give herself the shot in the abdomen with minimal pain and discomfort.  She reports that she is looking forward to her upcoming visit with [facility] for further discussions regarding her coagulation issues and failure of Eliquis.  She reports that she is able to afford Lovenox  therapy without difficulty.  She reports that she is having a little bit of numbness in her fingers but denies any fevers, chills, sweats, nausea, Oni or diarrhea.  A full 10 point ROS is otherwise negative.  MEDICAL HISTORY:  Past Medical History:   Diagnosis Date   Anemia    during pregnancy   Anxiety    Arthritis    "all over" (07/07/2017)   Bilateral pulmonary embolism (HCC) 10/29/2015   "from my left ankle"   Childhood asthma    as child, nothing as adult   Chronic back pain    "all over" (07/07/2017)   DVT (deep venous thrombosis) (HCC) 10/29/2015   "from my left ankle"   GERD (gastroesophageal reflux disease)    tx. Tums   H/O hiatal hernia    "repaired w/gastric bypass OR" (07/07/2017)   History of esophageal dilatation    Hypothyroidism    Lupus anticoagulant disorder (HCC)    Dr. Dirk Fredericks    Neuromuscular disorder [facility])    L thigh - Numbness, nerve damage     Peripheral vascular disease (HCC)    Sacroiliac pain    "left thigh has been numb since 01/2016 after appendectomy" (07/07/2017)   Sleep apnea    07/07/2017 "bariatric dr says I don't need CPAP anymore" (07/07/2017)   Thyroid disease    Uterine cancer (HCC) 01/2014   Varicose veins    Ventral hernia    Vitamin D  deficiency     SURGICAL HISTORY: Past Surgical History:  Procedure Laterality Date   ABDOMINAL HYSTERECTOMY  01/2014   "robotic"   APPENDECTOMY     ruptured 01/2016   BALLOON DILATION N/A 08/02/2013   Procedure: BALLOON DILATION;  Surgeon: Evangeline Hilts, MD;  Location: [facility];  Service: Endoscopy;  Laterality: N/A;   BUNIONECTOMY WITH HAMMERTOE RECONSTRUCTION Left ~ 2005   CARPAL TUNNEL RELEASE Right  01/14/2024   Procedure: RIGHT CARPAL TUNNEL RELEASE;  Surgeon: Ltanya Rummer, MD;  Location: [facility] OR;  Service: Orthopedics;  Laterality: Right;   COLONOSCOPY WITH PROPOFOL  N/A 08/02/2013   Procedure: COLONOSCOPY WITH PROPOFOL ;  Surgeon: Evangeline Hilts, MD;  Location: [facility];  Service: Endoscopy;  Laterality: N/A;   DILATION AND CURETTAGE OF UTERUS  x2   S/P miscarriage   ESOPHAGOGASTRODUODENOSCOPY (EGD) WITH ESOPHAGEAL DILATION  ~ 2003   ESOPHAGOGASTRODUODENOSCOPY (EGD) WITH PROPOFOL  N/A 08/02/2013   Procedure:  ESOPHAGOGASTRODUODENOSCOPY (EGD) WITH PROPOFOL ;  Surgeon: Evangeline Hilts, MD;  Location: [facility];  Service: Endoscopy;  Laterality: N/A;   FASCIECTOMY, UPPER EXTREMITY Right 01/14/2024   Procedure: FASCIECTOMY;  Surgeon: Ltanya Rummer, MD;  Location: [facility] OR;  Service: Orthopedics;  Laterality: Right;   HERNIA REPAIR  04/2017   VHR   INCISION AND DRAINAGE OF WOUND Right 01/14/2024   Procedure: RIGHT WRIST IRRIGATION AND DEBRIDEMENT;  Surgeon: Ltanya Rummer, MD;  Location: [facility] OR;  Service: Orthopedics;  Laterality: Right;   INSERTION OF MESH N/A 04/28/2017   Procedure: POSSIBLE INSERTION OF MESH;  Surgeon: Oza Blumenthal, MD;  Location: [facility] OR;  Service: General;  Laterality: N/A;   JOINT REPLACEMENT     LAPAROSCOPIC APPENDECTOMY N/A 02/07/2016   Procedure: LAPAROSCOPIC APPENDECTOMY;  Surgeon: Oza Blumenthal, MD;  Location: [facility] OR;  Service: General;  Laterality: N/A;   OPEN REDUCTION INTERNAL FIXATION (ORIF) DISTAL RADIAL FRACTURE Right 12/28/2023   Procedure: OPEN REDUCTION INTERNAL FIXATION (ORIF) DISTAL RADIUS FRACTURE;  Surgeon: Saundra Curl, MD;  Location: [facility]S;  Service: Orthopedics;  Laterality: Right;   ROBOTIC ASSISTED TOTAL HYSTERECTOMY WITH BILATERAL SALPINGO OOPHERECTOMY  01/23/2014   uterine cancer   ROUX-EN-Y GASTRIC BYPASS  11/2016   [facility]   SHOULDER ARTHROSCOPY WITH DEBRIDEMENT AND BICEP TENDON REPAIR Left 09/11/2014   Procedure: SHOULDER ARTHROSCOPY WITH DEBRIDEMENT AND BICEP TENDON REPAIR;  Surgeon: Saundra Curl, MD;  Location: [facility] OR;  Service: Orthopedics;  Laterality: Left;   SHOULDER ARTHROSCOPY WITH DISTAL CLAVICLE RESECTION Left 09/11/2014   Procedure: SHOULDER ARTHROSCOPY WITH DISTAL CLAVICLE RESECTION;  Surgeon: Saundra Curl, MD;  Location: [facility] OR;  Service: Orthopedics;  Laterality: Left;   SHOULDER ARTHROSCOPY WITH SUBACROMIAL DECOMPRESSION Left 09/11/2014   Procedure: SHOULDER ARTHROSCOPY WITH SUBACROMIAL DECOMPRESSION;  Surgeon: Saundra Curl, MD;  Location: [facility] OR;  Service: Orthopedics;  Laterality: Left;   TONSILLECTOMY     TOTAL KNEE ARTHROPLASTY Right 07/06/2017   TOTAL KNEE ARTHROPLASTY Right 07/06/2017   Procedure: RIGHT TOTAL KNEE ARTHROPLASTY;  Surgeon: Saundra Curl, MD;  Location: [facility] OR;  Service: Orthopedics;  Laterality: Right;   TOTAL KNEE ARTHROPLASTY Left 04/19/2018   Procedure: LEFT TOTAL KNEE ARTHROPLASTY;  Surgeon: Saundra Curl, MD;  Location: [facility] OR;  Service: Orthopedics;  Laterality: Left;   TOTAL SHOULDER ARTHROPLASTY Left 04/04/2019   Procedure: TOTAL SHOULDER ARTHROPLASTY reverse;  Surgeon: Saundra Curl, MD;  Location: [facility];  Service: Orthopedics;  Laterality: Left;   VENTRAL HERNIA REPAIR N/A 04/28/2017   Procedure: HERNIA REPAIR VENTRAL ADULT;  Surgeon: Oza Blumenthal, MD;  Location: [facility] OR;  Service: General;  Laterality: N/A;    SOCIAL HISTORY: Social History   Socioeconomic History   Marital status: Divorced    Spouse name: Not on file   Number of children: Not on file   Years of education: Not on file   Highest education level: Not on file  Occupational History   Not on file  Tobacco Use   Smoking  status: Never   Smokeless tobacco: Former    Types: Chew   Tobacco comments:    "chewed as a teen"  Vaping Use   Vaping status: Never Used  Substance and Sexual Activity   Alcohol use: Never   Drug use: No   Sexual activity: Yes    Birth control/protection: None  Other Topics Concern   Not on file  Social History Narrative   Not on file   Social Drivers of Health   Financial Resource Strain: Medium Risk (12/21/2023)   Received from [facility]   Overall Financial Resource Strain (CARDIA)    Difficulty of Paying Living Expenses: Somewhat hard  Food Insecurity: No Food Insecurity (01/14/2024)   Hunger Vital Sign    Worried About Running Out of Food in the Last Year: Never true    Ran Out of Food in the Last Year: Never true  Recent Concern: Food Insecurity - Food  Insecurity Present (12/21/2023)   Received from [facility]   Hunger Vital Sign    Worried About Running Out of Food in the Last Year: Sometimes true    Ran Out of Food in the Last Year: Sometimes true  Transportation Needs: No Transportation Needs (01/14/2024)   PRAPARE - Administrator, Civil Service (Medical): No    Lack of Transportation (Non-Medical): No  Physical Activity: Sufficiently Active (12/21/2023)   Received from [facility]   Exercise Vital Sign    Days of Exercise per Week: 7 days    Minutes of Exercise per Session: 70 min  Stress: Stress Concern Present (12/21/2023)   Received from [facility] - Occupational Stress Questionnaire    Feeling of Stress : Rather much  Social Connections: Unknown (01/14/2024)   Social Connection and Isolation Panel [NHANES]    Frequency of Communication with Friends and Family: Never    Frequency of Social Gatherings with Friends and Family: Never    Attends Religious Services: Never    Database administrator or Organizations: Yes    Attends Banker Meetings: Never    Marital Status: Patient declined  Catering manager Violence: Not At Risk (01/15/2024)   Humiliation, Afraid, Rape, and Kick questionnaire    Fear of Current or Ex-Partner: No    Emotionally Abused: No    Physically Abused: No    Sexually Abused: No    FAMILY HISTORY: Family History  Problem Relation Age of Onset   Transient ischemic attack Mother    Heart attack Father    Diabetes type II Other    Lung cancer Maternal Grandmother     ALLERGIES:  is allergic to bee venom, hymenoptera venom preparations, other, penicillins, latex, morphine  and codeine, plastibase, and tangerine flavoring agent (non-screening).  MEDICATIONS:  Current Outpatient Medications  Medication Sig Dispense Refill   enoxaparin  (LOVENOX ) 60 MG/0.6ML injection Inject 0.6 mLs (60 mg total) into the skin every 12 (twelve) hours for  28 days. 33.6 mL 0   Calcium Carbonate-Vitamin D  (CALTRATE 600+D PO) Take 1 tablet by mouth daily.     famotidine  (PEPCID ) 20 MG tablet Take 20 mg by mouth daily.      levothyroxine  (SYNTHROID , LEVOTHROID) 50 MCG tablet Take 50 mcg by mouth daily before breakfast.      Multiple Vitamin (MULTIVITAMIN WITH MINERALS) TABS tablet Take 1 tablet by mouth 2 (two) times daily.      omeprazole (PRILOSEC) 40 MG capsule Take 40 mg by mouth every  evening.      ondansetron  (ZOFRAN -ODT) 4 MG disintegrating tablet Take 1 tablet (4 mg total) by mouth every 8 (eight) hours as needed for nausea or vomiting. 15 tablet 0   oxyCODONE  (ROXICODONE ) 5 MG immediate release tablet Take 1 tablet (5 mg total) by mouth every 6 (six) hours as needed. 15 tablet 0   pseudoephedrine (SUDAFED) 120 MG 12 hr tablet Take 120 mg by mouth every 12 (twelve) hours as needed for congestion.     No current facility-administered medications for this visit.    REVIEW OF SYSTEMS:   Constitutional: ( - ) fevers, ( - )  chills , ( - ) night sweats Eyes: ( - ) blurriness of vision, ( - ) double vision, ( - ) watery eyes Ears, nose, mouth, throat, and face: ( - ) mucositis, ( - ) sore throat Respiratory: ( - ) cough, ( - ) dyspnea, ( - ) wheezes Cardiovascular: ( - ) palpitation, ( - ) chest discomfort, ( - ) lower extremity swelling Gastrointestinal:  ( - ) nausea, ( - ) heartburn, ( - ) change in bowel habits Skin: ( - ) abnormal skin rashes Lymphatics: ( - ) new lymphadenopathy, ( - ) easy bruising Neurological: ( - ) numbness, ( - ) tingling, ( - ) new weaknesses Behavioral/Psych: ( - ) mood change, ( - ) new changes  All other systems were reviewed with the patient and are negative.  PHYSICAL EXAMINATION:  Vitals:   02/04/24 1505  BP: (!) 143/91  Pulse: 73  Resp: 14  Temp: (!) 97.2 F (36.2 C)  SpO2: 97%   Filed Weights   02/04/24 1505  Weight: 140 lb (63.5 kg)    GENERAL: Well-appearing elderly Caucasian female,  alert, no distress and comfortable SKIN: skin color, texture, turgor are normal, no rashes or significant lesions EYES: conjunctiva are pink and non-injected, sclera clear LUNGS: clear to auscultation and percussion with normal breathing effort HEART: regular rate & rhythm and no murmurs and no lower extremity edema Musculoskeletal: no cyanosis of digits and no clubbing  PSYCH: alert & oriented x 3, fluent speech NEURO: no focal motor/sensory deficits  LABORATORY DATA:  I have reviewed the data as listed    Latest Ref Rng & Units 02/04/2024    2:45 PM 01/19/2024   10:56 AM 01/15/2024    7:12 AM  CBC  WBC 4.0 - 10.5 K/uL 5.1  4.9  8.5   Hemoglobin 12.0 - 15.0 g/dL 44.0  10.2  72.5   Hematocrit 36.0 - 46.0 % 41.7  40.2  39.4   Platelets 150 - 400 K/uL 366  262  232        Latest Ref Rng & Units 02/04/2024    2:45 PM 01/19/2024   10:56 AM 01/15/2024    7:12 AM  CMP  Glucose 70 - 99 mg/dL 86  81  86   BUN 8 - 23 mg/dL 16  16  16    Creatinine 0.44 - 1.00 mg/dL 3.66  4.40  3.47   Sodium 135 - 145 mmol/L 139  141  136   Potassium 3.5 - 5.1 mmol/L 4.1  3.7  3.9   Chloride 98 - 111 mmol/L 106  106  106   CO2 22 - 32 mmol/L 27  27  22    Calcium 8.9 - 10.3 mg/dL 9.1  9.1  8.3   Total Protein 6.5 - 8.1 g/dL 7.2  7.1  5.6   Total Bilirubin 0.0 -  1.2 mg/dL 0.3  0.5  0.5   Alkaline Phos 38 - 126 U/L 48  49  42   AST 15 - 41 U/L 19  28  23    ALT 0 - 44 U/L 21  29  23     RADIOGRAPHIC STUDIES: I have personally reviewed the radiological images as listed and agreed with the findings in the report. DG Chest Portable 1 View Result Date: 01/14/2024 CLINICAL DATA:  Chest pains. EXAM: PORTABLE CHEST 1 VIEW COMPARISON:  PA and lateral chest 02/23/2017 FINDINGS: The heart size and mediastinal contours are within normal limits. There is calcification in the transverse aorta. A superimposed skin fold simulates a left apicolateral pneumothorax. No pneumothorax is suspected. Both lungs are clear. There is  osteopenia, thoracic spondylosis, and interval new reverse left shoulder arthroplasty. IMPRESSION: No evidence of acute chest disease. Aortic atherosclerosis. Osteopenia and degenerative change. Electronically Signed   By: Denman Fischer M.D.   On: 01/14/2024 05:00    ASSESSMENT & PLAN ANAISE STERBENZ 68 y.o. female with medical history significant for recurrent thromboembolism and failure of Eliquis and Xarelto  who presents for a follow up visit.  # Recurrent VTE # Failure of Eliquis/Xarelto  -- Continue Lovenox  injections 1 mg/kg twice daily subcutaneous -- Refered to Dr. Nancye Azure, a blood clotting expert in [facility], for further evaluation and management.  Her visit is coming up in June 2025. -- Patient is tolerating Lovenox  therapy well and willing/able to continue at this time. -- After approximate 3 months of therapy could consider transition to Coumadin.  Pradaxa could also be a consideration but is more difficult in terms of price and availability -- Plan to have the patient return to clinic with a phone visit in 4 weeks to discuss next step moving forward.  # Positive Lupus Anticoagulant Panel -- Very low clinical suspicion that this represents a genuine lupus anticoagulant.  It appears this was drawn on anticoagulation therapy. -- Antibodies have been negative.  # Anemia due to blood loss # Hematoma of left leg --Severe anemia due to bleeding from a hematoma in the left leg, with hemoglobin dropping from 12 to 4, requiring 7 units of blood transfusion. Anemia likely secondary to anticoagulation therapy. --Hematoma in the left leg leading to significant blood loss and hospitalization, likely related to anticoagulation therapy.   # History of uterine cancer --Uterine cancer with total hysterectomy. No chemotherapy or radiation required. Currently no active issues related to uterine cancer.  No orders of the defined types were placed in this encounter.   All questions were  answered. The patient knows to call the clinic with any problems, questions or concerns.  A total of more than 30 minutes were spent on this encounter with face-to-face time and non-face-to-face time, including preparing to see the patient, ordering tests and/or medications, counseling the patient and coordination of care as outlined above.   Rogerio Clay, MD Department of Hematology/Oncology [facility] Phone: [phone] Pager: [phone] Email: [email or web address]  02/12/2024 6:50 PM

## 2024-02-29 DIAGNOSIS — Z7901 Long term (current) use of anticoagulants: Secondary | ICD-10-CM | POA: Diagnosis not present

## 2024-03-02 ENCOUNTER — Other Ambulatory Visit: Payer: Self-pay | Admitting: Physician Assistant

## 2024-03-03 ENCOUNTER — Inpatient Hospital Stay: Attending: Internal Medicine | Admitting: Physician Assistant

## 2024-03-03 DIAGNOSIS — Z86711 Personal history of pulmonary embolism: Secondary | ICD-10-CM | POA: Diagnosis present

## 2024-03-03 DIAGNOSIS — Z8542 Personal history of malignant neoplasm of other parts of uterus: Secondary | ICD-10-CM | POA: Insufficient documentation

## 2024-03-03 DIAGNOSIS — Z8639 Personal history of other endocrine, nutritional and metabolic disease: Secondary | ICD-10-CM | POA: Insufficient documentation

## 2024-03-03 DIAGNOSIS — R760 Raised antibody titer: Secondary | ICD-10-CM | POA: Insufficient documentation

## 2024-03-03 DIAGNOSIS — Z86718 Personal history of other venous thrombosis and embolism: Secondary | ICD-10-CM

## 2024-03-05 NOTE — Progress Notes (Signed)
 [facility] [phone]   [phone]  PROGRESS NOTE  Patient Care Team: Rena Luke POUR, MD as PCP - General (Family Medicine) Joya Stabs, DPM as Consulting Physician (Podiatry)  Hematological/Oncological History # Recurrent VTE # Failure of Eliquis/Xarelto    HPI: Mrs. 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 Her initial DVT occurred in the left lower extremity in 2017, treated with Xarelto  for six months. Following a left knee replacement, she experienced another clot and resumed Xarelto , which was intended for lifelong use. However, she developed another clot while on Xarelto , leading to a switch to Eliquis. Despite adherence to Eliquis, she experienced another clot within the last six to eight months, prompting a switch to Lovenox  injections twice daily.   Interval History:  Erin Vasquez 68 y.o. female with medical history significant for recurrent thromboembolism for a virtual visit. The patient's last visit was on 02/04/2024. In the interim, she underwent evaluation with Dr. Darcy at [facility].   On exam today Ms. Posner reports that she is tolerating her Lovenox  therapy well but based on Dr. Lucetta recommendations, she is planning to swich to Eliquis therapy. She denies any overt signs of bleeding including hematochezia or melena. She denies any signs or symptoms of recurrent thrombosis.  She denies any fevers, chills, sweats, nausea, vomiting or diarrhea.  A full 10 point ROS is otherwise negative.  MEDICAL HISTORY:  Past Medical History:  Diagnosis Date   Anemia    during pregnancy   Anxiety    Arthritis    all over (07/07/2017)   Bilateral pulmonary embolism (HCC) 10/29/2015   from my left ankle   Childhood asthma    as child, nothing as adult   Chronic back pain    all over (07/07/2017)   DVT (deep venous thrombosis) (HCC) 10/29/2015   from my left ankle   GERD (gastroesophageal  reflux disease)    tx. Tums   H/O hiatal hernia    repaired w/gastric bypass OR (07/07/2017)   History of esophageal dilatation    Hypothyroidism    Lupus anticoagulant disorder (HCC)    Dr. Amadeo    Neuromuscular disorder [facility])    L thigh - Numbness, nerve damage     Peripheral vascular disease (HCC)    Sacroiliac pain    left thigh has been numb since 01/2016 after appendectomy (07/07/2017)   Sleep apnea    07/07/2017 bariatric dr says I don't need CPAP anymore (07/07/2017)   Thyroid disease    Uterine cancer (HCC) 01/2014   Varicose veins    Ventral hernia    Vitamin D  deficiency     SURGICAL HISTORY: Past Surgical History:  Procedure Laterality Date   ABDOMINAL HYSTERECTOMY  01/2014   robotic   APPENDECTOMY     ruptured 01/2016   BALLOON DILATION N/A 08/02/2013   Procedure: BALLOON DILATION;  Surgeon: Elsie Cree, MD;  Location: [facility];  Service: Endoscopy;  Laterality: N/A;   BUNIONECTOMY WITH HAMMERTOE RECONSTRUCTION Left ~ 2005   CARPAL TUNNEL RELEASE Right 01/14/2024   Procedure: RIGHT CARPAL TUNNEL RELEASE;  Surgeon: Alyse Agent, MD;  Location: [facility] OR;  Service: Orthopedics;  Laterality: Right;   COLONOSCOPY WITH PROPOFOL  N/A 08/02/2013   Procedure: COLONOSCOPY WITH PROPOFOL ;  Surgeon: Elsie Cree, MD;  Location: [facility];  Service: Endoscopy;  Laterality: N/A;   DILATION AND CURETTAGE OF UTERUS  x2   S/P miscarriage   ESOPHAGOGASTRODUODENOSCOPY (EGD) WITH ESOPHAGEAL DILATION  ~  2003   ESOPHAGOGASTRODUODENOSCOPY (EGD) WITH PROPOFOL  N/A 08/02/2013   Procedure: ESOPHAGOGASTRODUODENOSCOPY (EGD) WITH PROPOFOL ;  Surgeon: Elsie Cree, MD;  Location: [facility];  Service: Endoscopy;  Laterality: N/A;   FASCIECTOMY, UPPER EXTREMITY Right 01/14/2024   Procedure: FASCIECTOMY;  Surgeon: Alyse Agent, MD;  Location: [facility] OR;  Service: Orthopedics;  Laterality: Right;   HERNIA REPAIR  04/2017   VHR   INCISION AND DRAINAGE OF WOUND Right 01/14/2024    Procedure: RIGHT WRIST IRRIGATION AND DEBRIDEMENT;  Surgeon: Alyse Agent, MD;  [facility]: [facility];  Service: Orthopedics;  Laterality: Right;   INSERTION OF MESH N/A 04/28/2017   Procedure: POSSIBLE INSERTION OF MESH;  Surgeon: Vernetta Berg, MD;  Location: [facility];  Service: General;  Laterality: N/A;   JOINT REPLACEMENT     LAPAROSCOPIC APPENDECTOMY N/A 02/07/2016   Procedure: LAPAROSCOPIC APPENDECTOMY;  Surgeon: Berg Vernetta, MD;  Location: [facility];  Service: General;  Laterality: N/A;   OPEN REDUCTION INTERNAL FIXATION (ORIF) DISTAL RADIAL FRACTURE Right 12/28/2023   Procedure: OPEN REDUCTION INTERNAL FIXATION (ORIF) DISTAL RADIUS FRACTURE;  Surgeon: Beverley Evalene BIRCH, MD;  Location: [facility];  Service: Orthopedics;  Laterality: Right;   ROBOTIC ASSISTED TOTAL HYSTERECTOMY WITH BILATERAL SALPINGO OOPHERECTOMY  01/23/2014   uterine cancer   ROUX-EN-Y GASTRIC BYPASS  11/2016   [facility]   SHOULDER ARTHROSCOPY WITH DEBRIDEMENT AND BICEP TENDON REPAIR Left 09/11/2014   Procedure: SHOULDER ARTHROSCOPY WITH DEBRIDEMENT AND BICEP TENDON REPAIR;  Surgeon: Evalene BIRCH Beverley, MD;  [facility];  Service: Orthopedics;  Laterality: Left;   SHOULDER ARTHROSCOPY WITH DISTAL CLAVICLE RESECTION Left 09/11/2014   Procedure: SHOULDER ARTHROSCOPY WITH DISTAL CLAVICLE RESECTION;  Surgeon: Evalene BIRCH Beverley, MD;  [facility];  Service: Orthopedics;  Laterality: Left;   SHOULDER ARTHROSCOPY WITH SUBACROMIAL DECOMPRESSION Left 09/11/2014   Procedure: SHOULDER ARTHROSCOPY WITH SUBACROMIAL DECOMPRESSION;  Surgeon: Evalene BIRCH Beverley, MD;  [facility];  Service: Orthopedics;  Laterality: Left;   TONSILLECTOMY     TOTAL KNEE ARTHROPLASTY Right 07/06/2017   TOTAL KNEE ARTHROPLASTY Right 07/06/2017   Procedure: RIGHT TOTAL KNEE ARTHROPLASTY;  Surgeon: Beverley Evalene BIRCH, MD;  Location: [facility] OR;  Service: Orthopedics;  Laterality: Right;   TOTAL KNEE ARTHROPLASTY Left 04/19/2018   Procedure: LEFT TOTAL KNEE  ARTHROPLASTY;  Surgeon: Beverley Evalene BIRCH, MD;  Location: [facility] OR;  Service: Orthopedics;  Laterality: Left;   TOTAL SHOULDER ARTHROPLASTY Left 04/04/2019   Procedure: TOTAL SHOULDER ARTHROPLASTY reverse;  Surgeon: Beverley Evalene BIRCH, MD;  Location[facility];  Service: Orthopedics;  Laterality: Left;   VENTRAL HERNIA REPAIR N/A 04/28/2017   Procedure: HERNIA REPAIR VENTRAL ADULT;  Surgeon: Vernetta Berg, MD;  Location: [facility] OR;  Service: General;  Laterality: N/A;    SOCIAL HISTORY: Social History   Socioeconomic History   Marital status: Divorced    Spouse name: Not on file   Number of children: Not on file   Years of education: Not on file   Highest education level: Not on file  Occupational History   Not on file  Tobacco Use   Smoking status: Never   Smokeless tobacco: Former    Types: Chew   Tobacco comments:    chewed as a teen  Advertising account planner   Vaping status: Never Used  Substance and Sexual Activity   Alcohol use: Never   Drug use: No   Sexual activity: Yes    Birth control/protection: None  Other Topics Concern   Not on file  Social History Narrative  Not on file   Social Drivers of Health   Financial Resource Strain: Medium Risk (12/21/2023)   Received from [facility]   Overall Financial Resource Strain (CARDIA)    Difficulty of Paying Living Expenses: Somewhat hard  Food Insecurity: No Food Insecurity (01/14/2024)   Hunger Vital Sign    Worried About Running Out of Food in the Last Year: Never true    Ran Out of Food in the Last Year: Never true  Recent Concern: Food Insecurity - Food Insecurity Present (12/21/2023)   Received from [facility]   Hunger Vital Sign    Within the past 12 months, you worried that your food would run out before you got the money to buy more.: Sometimes true    Within the past 12 months, the food you bought just didn't last and you didn't have money to get more.: Sometimes true  Transportation Needs: No Transportation Needs (01/14/2024)    PRAPARE - Administrator, Civil Service (Medical): No    Lack of Transportation (Non-Medical): No  Physical Activity: Sufficiently Active (12/21/2023)   Received from [facility]   Exercise Vital Sign    On average, how many days per week do you engage in moderate to strenuous exercise (like a brisk walk)?: 7 days    On average, how many minutes do you engage in exercise at this level?: 70 min  Stress: Stress Concern Present (12/21/2023)   Received from [facility] - Occupational Stress Questionnaire    Feeling of Stress : Rather much  Social Connections: Unknown (01/14/2024)   Social Connection and Isolation Panel    Frequency of Communication with Friends and Family: Never    Frequency of Social Gatherings with Friends and Family: Never    Attends Religious Services: Never    Database administrator or Organizations: Yes    Attends Banker Meetings: Never    Marital Status: Patient declined  Catering manager Violence: Not At Risk (01/15/2024)   Humiliation, Afraid, Rape, and Kick questionnaire    Fear of Current or Ex-Partner: No    Emotionally Abused: No    Physically Abused: No    Sexually Abused: No    FAMILY HISTORY: Family History  Problem Relation Age of Onset   Transient ischemic attack Mother    Heart attack Father    Diabetes type II Other    Lung cancer Maternal Grandmother     ALLERGIES:  is allergic to bee venom, hymenoptera venom preparations, other, penicillins, latex, morphine  and codeine, plastibase, and tangerine flavoring agent (non-screening).  MEDICATIONS:  Current Outpatient Medications  Medication Sig Dispense Refill   Calcium Carbonate-Vitamin D  (CALTRATE 600+D PO) Take 1 tablet by mouth daily.     enoxaparin  (LOVENOX ) 60 MG/0.6ML injection Inject 0.6 mLs (60 mg total) into the skin every 12 (twelve) hours for 28 days. 33.6 mL 0   famotidine  (PEPCID ) 20 MG tablet Take 20 mg by mouth  daily.      levothyroxine  (SYNTHROID , LEVOTHROID) 50 MCG tablet Take 50 mcg by mouth daily before breakfast.      Multiple Vitamin (MULTIVITAMIN WITH MINERALS) TABS tablet Take 1 tablet by mouth 2 (two) times daily.      omeprazole (PRILOSEC) 40 MG capsule Take 40 mg by mouth every evening.      ondansetron  (ZOFRAN -ODT) 4 MG disintegrating tablet Take 1 tablet (4 mg total) by mouth every 8 (eight) hours as needed for nausea or  vomiting. 15 tablet 0   oxyCODONE  (ROXICODONE ) 5 MG immediate release tablet Take 1 tablet (5 mg total) by mouth every 6 (six) hours as needed. 15 tablet 0   pseudoephedrine (SUDAFED) 120 MG 12 hr tablet Take 120 mg by mouth every 12 (twelve) hours as needed for congestion.     No current facility-administered medications for this visit.    REVIEW OF SYSTEMS:   Constitutional: ( - ) fevers, ( - )  chills , ( - ) night sweats Eyes: ( - ) blurriness of vision, ( - ) double vision, ( - ) watery eyes Ears, nose, mouth, throat, and face: ( - ) mucositis, ( - ) sore throat Respiratory: ( - ) cough, ( - ) dyspnea, ( - ) wheezes Cardiovascular: ( - ) palpitation, ( - ) chest discomfort, ( - ) lower extremity swelling Gastrointestinal:  ( - ) nausea, ( - ) heartburn, ( - ) change in bowel habits Skin: ( - ) abnormal skin rashes Lymphatics: ( - ) new lymphadenopathy, ( - ) easy bruising Neurological: ( - ) numbness, ( - ) tingling, ( - ) new weaknesses Behavioral/Psych: ( - ) mood change, ( - ) new changes  All other systems were reviewed with the patient and are negative.  PHYSICAL EXAMINATION: Not performed due to virtual visit.   LABORATORY DATA:  I have reviewed the data as listed    Latest Ref Rng & Units 02/04/2024    2:45 PM 01/19/2024   10:56 AM 01/15/2024    7:12 AM  CBC  WBC 4.0 - 10.5 K/uL 5.1  4.9  8.5   Hemoglobin 12.0 - 15.0 g/dL 86.4  86.8  87.5   Hematocrit 36.0 - 46.0 % 41.7  40.2  39.4   Platelets 150 - 400 K/uL 366  262  232        Latest Ref Rng &  Units 02/04/2024    2:45 PM 01/19/2024   10:56 AM 01/15/2024    7:12 AM  CMP  Glucose 70 - 99 mg/dL 86  81  86   BUN 8 - 23 mg/dL 16  16  16    Creatinine 0.44 - 1.00 mg/dL 9.32  9.27  9.38   Sodium 135 - 145 mmol/L 139  141  136   Potassium 3.5 - 5.1 mmol/L 4.1  3.7  3.9   Chloride 98 - 111 mmol/L 106  106  106   CO2 22 - 32 mmol/L 27  27  22    Calcium 8.9 - 10.3 mg/dL 9.1  9.1  8.3   Total Protein 6.5 - 8.1 g/dL 7.2  7.1  5.6   Total Bilirubin 0.0 - 1.2 mg/dL 0.3  0.5  0.5   Alkaline Phos 38 - 126 U/L 48  49  42   AST 15 - 41 U/L 19  28  23    ALT 0 - 44 U/L 21  29  23     RADIOGRAPHIC STUDIES: I have personally reviewed the radiological images as listed and agreed with the findings in the report. No results found.   ASSESSMENT & PLAN SLOKA VOLANTE is a 68 y.o. female with medical history significant for recurrent thromboembolism.   # Recurrent VTE # Failure of Eliquis/Xarelto  -- Continue Lovenox  injections 1 mg/kg twice daily subcutaneous -- Evaluated by Dr. Garnette Parents from [facility] on 02/29/2024. Per review of his note, he agrees on long term anticoagulation. Sine there is question of true failure of DOAC therapy, offered  options including continuing on Lovenox  therapy versus switching to fondaparinux or Eliquis. Patient agreed to switching back to Eliquis therapy.  PLAN: --Patient is waiting on prescription of Eliquis therapy from Dr. Lucetta team.  --Will obtain Eliquis trough level at [facility] 1 week after starting Eliquis therapy --Labs from 02/04/2024 reviewed and require no intervention.  --RTC in 6 months with repeat labs.    # Positive Lupus Anticoagulant Panel -- Very low clinical suspicion that this represents a genuine lupus anticoagulant.  It appears this was drawn on anticoagulation therapy. -- Antibodies have been negative.  # H/O Anemia due to blood loss #H/O Hematoma of left leg --Severe anemia due to bleeding from a hematoma in the left leg, with hemoglobin  dropping from 12 to 4, requiring 7 units of blood transfusion. Anemia likely secondary to anticoagulation therapy. --Hematoma in the left leg leading to significant blood loss and hospitalization, likely related to anticoagulation therapy.   # History of uterine cancer --Uterine cancer with total hysterectomy. No chemotherapy or radiation required. Currently no active issues related to uterine cancer.  No orders of the defined types were placed in this encounter.   All questions were answered. The patient knows to call the clinic with any problems, questions or concerns.  A total of more than 25 minutes were spent on this encounter with  non-face-to-face time, including preparing to see the patient, ordering tests and/or medications, counseling the patient and coordination of care as outlined above.   Johnston Police PA-C Dept of Hematology and Oncology [facility] Phone: [phone]   03/05/2024 10:24 PM

## 2024-03-06 ENCOUNTER — Telehealth: Payer: Self-pay | Admitting: Physician Assistant

## 2024-03-06 DIAGNOSIS — S52501D Unspecified fracture of the lower end of right radius, subsequent encounter for closed fracture with routine healing: Secondary | ICD-10-CM | POA: Diagnosis not present

## 2024-03-06 DIAGNOSIS — M25562 Pain in left knee: Secondary | ICD-10-CM | POA: Diagnosis not present

## 2024-03-06 DIAGNOSIS — M25551 Pain in right hip: Secondary | ICD-10-CM | POA: Diagnosis not present

## 2024-03-06 DIAGNOSIS — M25552 Pain in left hip: Secondary | ICD-10-CM | POA: Diagnosis not present

## 2024-03-06 NOTE — Telephone Encounter (Signed)
 Scheduled appointments per 6/20 los. Talked with the patient and he is aware of made appointments.

## 2024-03-08 DIAGNOSIS — I10 Essential (primary) hypertension: Secondary | ICD-10-CM | POA: Diagnosis not present

## 2024-03-08 DIAGNOSIS — Z86718 Personal history of other venous thrombosis and embolism: Secondary | ICD-10-CM | POA: Diagnosis not present

## 2024-03-08 DIAGNOSIS — M7989 Other specified soft tissue disorders: Secondary | ICD-10-CM | POA: Diagnosis not present

## 2024-03-08 DIAGNOSIS — D6859 Other primary thrombophilia: Secondary | ICD-10-CM | POA: Diagnosis not present

## 2024-03-08 DIAGNOSIS — Z7901 Long term (current) use of anticoagulants: Secondary | ICD-10-CM | POA: Diagnosis not present

## 2024-03-08 DIAGNOSIS — D6862 Lupus anticoagulant syndrome: Secondary | ICD-10-CM | POA: Diagnosis not present

## 2024-03-10 DIAGNOSIS — I8290 Acute embolism and thrombosis of unspecified vein: Secondary | ICD-10-CM | POA: Diagnosis not present

## 2024-03-31 DIAGNOSIS — M62541 Muscle wasting and atrophy, not elsewhere classified, right hand: Secondary | ICD-10-CM | POA: Diagnosis not present

## 2024-03-31 DIAGNOSIS — R278 Other lack of coordination: Secondary | ICD-10-CM | POA: Diagnosis not present

## 2024-04-03 DIAGNOSIS — R278 Other lack of coordination: Secondary | ICD-10-CM | POA: Diagnosis not present

## 2024-04-03 DIAGNOSIS — M62541 Muscle wasting and atrophy, not elsewhere classified, right hand: Secondary | ICD-10-CM | POA: Diagnosis not present

## 2024-04-04 DIAGNOSIS — E039 Hypothyroidism, unspecified: Secondary | ICD-10-CM | POA: Diagnosis not present

## 2024-04-04 DIAGNOSIS — Z86718 Personal history of other venous thrombosis and embolism: Secondary | ICD-10-CM | POA: Diagnosis not present

## 2024-04-04 DIAGNOSIS — I10 Essential (primary) hypertension: Secondary | ICD-10-CM | POA: Diagnosis not present

## 2024-04-04 DIAGNOSIS — G5691 Unspecified mononeuropathy of right upper limb: Secondary | ICD-10-CM | POA: Diagnosis not present

## 2024-04-05 DIAGNOSIS — R278 Other lack of coordination: Secondary | ICD-10-CM | POA: Diagnosis not present

## 2024-04-05 DIAGNOSIS — M62541 Muscle wasting and atrophy, not elsewhere classified, right hand: Secondary | ICD-10-CM | POA: Diagnosis not present

## 2024-04-07 DIAGNOSIS — R278 Other lack of coordination: Secondary | ICD-10-CM | POA: Diagnosis not present

## 2024-04-07 DIAGNOSIS — M62541 Muscle wasting and atrophy, not elsewhere classified, right hand: Secondary | ICD-10-CM | POA: Diagnosis not present

## 2024-04-10 DIAGNOSIS — R278 Other lack of coordination: Secondary | ICD-10-CM | POA: Diagnosis not present

## 2024-04-10 DIAGNOSIS — M62541 Muscle wasting and atrophy, not elsewhere classified, right hand: Secondary | ICD-10-CM | POA: Diagnosis not present

## 2024-04-18 DIAGNOSIS — M8589 Other specified disorders of bone density and structure, multiple sites: Secondary | ICD-10-CM | POA: Diagnosis not present

## 2024-04-18 DIAGNOSIS — R92323 Mammographic fibroglandular density, bilateral breasts: Secondary | ICD-10-CM | POA: Diagnosis not present

## 2024-04-18 DIAGNOSIS — Z1231 Encounter for screening mammogram for malignant neoplasm of breast: Secondary | ICD-10-CM | POA: Diagnosis not present

## 2024-04-18 DIAGNOSIS — M85852 Other specified disorders of bone density and structure, left thigh: Secondary | ICD-10-CM | POA: Diagnosis not present

## 2024-04-18 DIAGNOSIS — E559 Vitamin D deficiency, unspecified: Secondary | ICD-10-CM | POA: Diagnosis not present

## 2024-04-24 DIAGNOSIS — M25531 Pain in right wrist: Secondary | ICD-10-CM | POA: Diagnosis not present

## 2024-05-12 ENCOUNTER — Other Ambulatory Visit: Payer: Self-pay | Admitting: *Deleted

## 2024-05-12 ENCOUNTER — Encounter: Payer: Self-pay | Admitting: *Deleted

## 2024-05-12 MED ORDER — APIXABAN 5 MG PO TABS: 5.0000 mg | ORAL_TABLET | Freq: Two times a day (BID) | ORAL | 3 refills | Status: DC

## 2024-05-16 ENCOUNTER — Telehealth: Payer: Self-pay | Admitting: *Deleted

## 2024-05-16 NOTE — Telephone Encounter (Signed)
 TCT patient to confirm she saw MyChart message from office on Friday 8/29 that Eliquis  rx sent to pharm. Patient confirmed she had been contacted by pharm that her rx was ready for pick up today and she is glad she will not run out.  Ms. Grout stated her PCP, Dr. Rena has informed her that management of Eliquis  will be thru hematologist.  Next appts here at [facility] are in December for labs and to see Dr. Federico. Advised her if she experiences any bleeding or other changes at any time, contact office. She verbalized understanding.   Ms. Mcmenamin asked if she will need any labs prior to lab appt in December.   Advised her that this question will be sent to Dr. Federico and if labs are needed before December, CC scheduling will contact her.

## 2024-06-08 ENCOUNTER — Telehealth: Payer: Self-pay | Admitting: Diagnostic Neuroimaging

## 2024-06-08 NOTE — Telephone Encounter (Signed)
 Received message that 9/30 appt needs r/s. I called the pt and offered an afternoon on 10/1, she states she needs a morning. I asked if she could do Thursday morning and she said she can only do Mon-Wednesday or Friday mornings. Please advise where I may be able to put her, thank you!

## 2024-06-12 NOTE — Telephone Encounter (Signed)
 Pt scheduled for 10/1 at 9:30 AM with Dr. Margaret.

## 2024-06-13 ENCOUNTER — Ambulatory Visit: Admitting: Diagnostic Neuroimaging

## 2024-06-14 ENCOUNTER — Ambulatory Visit (INDEPENDENT_AMBULATORY_CARE_PROVIDER_SITE_OTHER): Admitting: Diagnostic Neuroimaging

## 2024-06-14 ENCOUNTER — Encounter: Payer: Self-pay | Admitting: Diagnostic Neuroimaging

## 2024-06-14 VITALS — BP 122/76 | HR 75 | Ht 63.0 in | Wt 146.4 lb

## 2024-06-14 DIAGNOSIS — R202 Paresthesia of skin: Secondary | ICD-10-CM

## 2024-06-14 DIAGNOSIS — R200 Anesthesia of skin: Secondary | ICD-10-CM | POA: Diagnosis not present

## 2024-06-14 NOTE — Patient Instructions (Addendum)
   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

## 2024-06-14 NOTE — Progress Notes (Signed)
 [facility]  PATIENT: Erin Vasquez DOB: [birth year]  REFERRING CLINICIAN: Ted Gerard HERO, PA-C HISTORY FROM: patient  REASON FOR VISIT: new consult    HISTORICAL  CHIEF COMPLAINT:  Chief Complaint  Patient presents with   RM 6     Patient is here for right hand neuropathy-  has had this issue since December 03, 2023 fall.     HISTORY OF PRESENT ILLNESS:   68 year old female here for evaluation of right hand numbness.  History of hypercoagulable state with lupus anticoagulant on anticoagulation, history of endometrial cancer, history of recurrent DVT/PE.  11/29/2023 patient got out of her car and developed a severe pain in the left thigh.  She went to the emergency room on 11/30/2023 for evaluation and had negative x-rays.  On 12/03/2023 had 2 episodes of syncope including one of the injured her right wrist.  She also developed progressive swelling in her left thigh.  She was admitted for evaluation of acute blood loss anemia, left thigh hematoma, right wrist fracture.    During admission, apparently she had a right antecubital blood draw, which was bandaged with tight compression due to her blood thinner use.  This was left in place and then a soft splint was placed over her arm.  This was on for a number of days at which point patient complained of numbness at this was removed and replaced.  She was discharged on 12/16/2023.  She was readmitted and had open reduction internal fixation of distal radius fracture on 12/28/2023.  She was discharged home.  She returned on 01/14/2024 due to severe swelling and pain concerning for postoperative hematoma.  She underwent right forearm fasciotomy, hematoma evacuation, carpal tunnel release, flexor pollicis longus tendon release surgery on 01/14/2024.  Since that time patient has undergone aggressive occupational therapy and exercises.  She still has stiffness in her right hand.  Continues to have persistent numbness in her entire hand,  up to her wrist, which has been stable and plateaued for several months now.    REVIEW OF SYSTEMS: Full 14 system review of systems performed and negative with exception of: as per HPI.  ALLERGIES: Allergies  Allergen Reactions   Bee Venom Swelling, Rash and Other (See Comments)    Patient is allergic to bee venom, mosquitos, wasps, yellow jackets and bees. Localized swelling Fever     Hymenoptera Venom Preparations Rash and Other (See Comments)    Patient is allergic to bee venom, mosquitos, wasps, yellow jackets and bees. Localized swelling Fever   Other Anaphylaxis, Shortness Of Breath, Itching and Rash    HICKORY SMOKE    Penicillins Anaphylaxis and Other (See Comments)    Did it involve swelling of the face/tongue/throat, SOB, or low BP? Yes Did it involve sudden or severe rash/hives, skin peeling, or any reaction on the inside of your mouth or nose? No Did you need to seek medical attention at a hospital or doctor's office? Yes When did it last happen?      childhood allergy If all above answers are "NO", may proceed with cephalosporin use.    Latex Rash    Latex rash due to elastic in zip-up TED hose at home (local rash), pt. Has had flu vaccine before without problems.   Morphine  And Codeine Itching and Swelling   Plastibase Rash   Tangerine Flavoring Agent (Non-Screening) Rash    HOME MEDICATIONS: Outpatient Medications Prior to Visit  Medication Sig Dispense Refill   apixaban  (ELIQUIS ) 5 MG TABS  tablet Take 1 tablet (5 mg total) by mouth 2 (two) times daily. 60 tablet 3   Calcium Carbonate-Vitamin D  (CALTRATE 600+D PO) Take 1 tablet by mouth daily.     Cholecalciferol  50 MCG (2000 UT) TABS Take 100 mcg by mouth.     ergocalciferol  (VITAMIN D2) 1.25 MG (50000 UT) capsule Take 50,000 Units by mouth once a week.     famotidine  (PEPCID ) 20 MG tablet Take 20 mg by mouth daily.      famotidine  (PEPCID ) 20 MG tablet Take 20 mg by mouth daily.     levothyroxine   (SYNTHROID , LEVOTHROID) 50 MCG tablet Take 50 mcg by mouth daily before breakfast.      Multiple Vitamin (MULTIVITAMIN WITH MINERALS) TABS tablet Take 1 tablet by mouth 2 (two) times daily.      omeprazole (PRILOSEC) 40 MG capsule Take 40 mg by mouth every evening.      omeprazole (PRILOSEC) 40 MG capsule Take 40 mg by mouth daily.     oxybutynin (DITROPAN-XL) 5 MG 24 hr tablet Take 5 mg by mouth daily.     pseudoephedrine (SUDAFED) 120 MG 12 hr tablet Take 120 mg by mouth every 12 (twelve) hours as needed for congestion. (Patient taking differently: Take 120 mg by mouth every 12 (twelve) hours as needed for congestion. As needed)     ondansetron  (ZOFRAN -ODT) 4 MG disintegrating tablet Take 1 tablet (4 mg total) by mouth every 8 (eight) hours as needed for nausea or vomiting. (Patient not taking: Reported on 06/14/2024) 15 tablet 0   oxyCODONE  (ROXICODONE ) 5 MG immediate release tablet Take 1 tablet (5 mg total) by mouth every 6 (six) hours as needed. (Patient not taking: Reported on 06/14/2024) 15 tablet 0   No facility-administered medications prior to visit.    PAST MEDICAL HISTORY: Past Medical History:  Diagnosis Date   Anemia    during pregnancy   Anxiety    Arthritis    all over (07/07/2017)   Bilateral pulmonary embolism (HCC) 10/29/2015   from my left ankle   Childhood asthma    as child, nothing as adult   Chronic back pain    all over (07/07/2017)   DVT (deep venous thrombosis) (HCC) 10/29/2015   from my left ankle   GERD (gastroesophageal reflux disease)    tx. Tums   H/O hiatal hernia    repaired w/gastric bypass OR (07/07/2017)   History of esophageal dilatation    Hypothyroidism    Lupus anticoagulant disorder    Dr. Amadeo    Neuromuscular disorder [facility])    L thigh - Numbness, nerve damage     Peripheral vascular disease    Sacroiliac pain    left thigh has been numb since 01/2016 after appendectomy (07/07/2017)   Sleep apnea    07/07/2017 bariatric  dr says I don't need CPAP anymore (07/07/2017)   Thyroid disease    Uterine cancer (HCC) 01/2014   Varicose veins    Ventral hernia    Vitamin D  deficiency     PAST SURGICAL HISTORY: Past Surgical History:  Procedure Laterality Date   ABDOMINAL HYSTERECTOMY  01/2014   robotic   APPENDECTOMY     ruptured 01/2016   BALLOON DILATION N/A 08/02/2013   Procedure: BALLOON DILATION;  Surgeon: Elsie Cree, MD;  Location: [facility];  Service: Endoscopy;  Laterality: N/A;   BUNIONECTOMY WITH HAMMERTOE RECONSTRUCTION Left ~ 2005   CARPAL TUNNEL RELEASE Right 01/14/2024   Procedure: RIGHT CARPAL TUNNEL RELEASE;  Surgeon:  Alyse Agent, MD;  Location: [facility] OR;  Service: Orthopedics;  Laterality: Right;   COLONOSCOPY WITH PROPOFOL  N/A 08/02/2013   Procedure: COLONOSCOPY WITH PROPOFOL ;  Surgeon: Elsie Cree, MD;  Location: [facility];  Service: Endoscopy;  Laterality: N/A;   DILATION AND CURETTAGE OF UTERUS  x2   S/P miscarriage   ESOPHAGOGASTRODUODENOSCOPY (EGD) WITH ESOPHAGEAL DILATION  ~ 2003   ESOPHAGOGASTRODUODENOSCOPY (EGD) WITH PROPOFOL  N/A 08/02/2013   Procedure: ESOPHAGOGASTRODUODENOSCOPY (EGD) WITH PROPOFOL ;  Surgeon: Elsie Cree, MD;  Location: [facility];  Service: Endoscopy;  Laterality: N/A;   FASCIECTOMY, UPPER EXTREMITY Right 01/14/2024   Procedure: FASCIECTOMY;  Surgeon: Alyse Agent, MD;  [facility] OR;  Service: Orthopedics;  Laterality: Right;   HERNIA REPAIR  04/2017   [facility]   INCISION AND DRAINAGE OF WOUND Right 01/14/2024   Procedure: RIGHT WRIST IRRIGATION AND DEBRIDEMENT;  Surgeon: Alyse Agent, MD;  Location: [facility] OR;  Service: Orthopedics;  Laterality: Right;   INSERTION OF MESH N/A 04/28/2017   Procedure: POSSIBLE INSERTION OF MESH;  Surgeon: Vernetta Berg, MD;  Location: [facility] OR;  Service: General;  Laterality: N/A;   JOINT REPLACEMENT     LAPAROSCOPIC APPENDECTOMY N/A 02/07/2016   Procedure: LAPAROSCOPIC APPENDECTOMY;  Surgeon: Berg Vernetta, MD;   Location: [facility] OR;  Service: General;  Laterality: N/A;   OPEN REDUCTION INTERNAL FIXATION (ORIF) DISTAL RADIAL FRACTURE Right 12/28/2023   Procedure: OPEN REDUCTION INTERNAL FIXATION (ORIF) DISTAL RADIUS FRACTURE;  Surgeon: Beverley Evalene BIRCH, MD;  Location: [facility];  Service: Orthopedics;  Laterality: Right;   ROBOTIC ASSISTED TOTAL HYSTERECTOMY WITH BILATERAL SALPINGO OOPHERECTOMY  01/23/2014   uterine cancer   ROUX-EN-Y GASTRIC BYPASS  11/2016   [facility]   SHOULDER ARTHROSCOPY WITH DEBRIDEMENT AND BICEP TENDON REPAIR Left 09/11/2014   Procedure: SHOULDER ARTHROSCOPY WITH DEBRIDEMENT AND BICEP TENDON REPAIR;  Surgeon: Evalene BIRCH Beverley, MD;  Location: [facility];  Service: Orthopedics;  Laterality: Left;   SHOULDER ARTHROSCOPY WITH DISTAL CLAVICLE RESECTION Left 09/11/2014   Procedure: SHOULDER ARTHROSCOPY WITH DISTAL CLAVICLE RESECTION;  Surgeon: Evalene BIRCH Beverley, MD;  Location: [facility];  Service: Orthopedics;  Laterality: Left;   SHOULDER ARTHROSCOPY WITH SUBACROMIAL DECOMPRESSION Left 09/11/2014   Procedure: SHOULDER ARTHROSCOPY WITH SUBACROMIAL DECOMPRESSION;  Surgeon: Evalene BIRCH Beverley, MD;  Location: [facility] OR;  Service: Orthopedics;  Laterality: Left;   TONSILLECTOMY     TOTAL KNEE ARTHROPLASTY Right 07/06/2017   TOTAL KNEE ARTHROPLASTY Right 07/06/2017   Procedure: RIGHT TOTAL KNEE ARTHROPLASTY;  Surgeon: Beverley Evalene BIRCH, MD;  Location: [facility] OR;  Service: Orthopedics;  Laterality: Right;   TOTAL KNEE ARTHROPLASTY Left 04/19/2018   Procedure: LEFT TOTAL KNEE ARTHROPLASTY;  Surgeon: Beverley Evalene BIRCH, MD;  Location: [facility] OR;  Service: Orthopedics;  Laterality: Left;   TOTAL SHOULDER ARTHROPLASTY Left 04/04/2019   Procedure: TOTAL SHOULDER ARTHROPLASTY reverse;  Surgeon: Beverley Evalene BIRCH, MD;  Location: [facility];  Service: Orthopedics;  Laterality: Left;   VENTRAL HERNIA REPAIR N/A 04/28/2017   Procedure: HERNIA REPAIR VENTRAL ADULT;  Surgeon: Vernetta Berg, MD;  Location: [facility] OR;  Service:  General;  Laterality: N/A;    FAMILY HISTORY: Family History  Problem Relation Age of Onset   Stroke Mother    Transient ischemic attack Mother    Heart attack Father    Migraines Sister    Lung cancer Maternal Grandmother    Diabetes type II Other     SOCIAL HISTORY: Social History   Socioeconomic History   Marital status: Divorced    Spouse name: Not  on file   Number of children: Not on file   Years of education: Not on file   Highest education level: Not on file  Occupational History   Not on file  Tobacco Use   Smoking status: Never   Smokeless tobacco: Former    Types: Chew   Tobacco comments:    chewed as a teen  Vaping Use   Vaping status: Never Used  Substance and Sexual Activity   Alcohol use: Never   Drug use: No   Sexual activity: Yes    Birth control/protection: None  Other Topics Concern   Not on file  Social History Narrative   1 diet pepsi when she goes to a restaurant (3 times a week)    Social Drivers of Health   Financial Resource Strain: Medium Risk (12/21/2023)   Received from [facility]   Overall Financial Resource Strain (CARDIA)    Difficulty of Paying Living Expenses: Somewhat hard  Food Insecurity: No Food Insecurity (01/14/2024)   Hunger Vital Sign    Worried About Running Out of Food in the Last Year: Never true    Ran Out of Food in the Last Year: Never true  Recent Concern: Food Insecurity - Food Insecurity Present (12/21/2023)   Received from [facility]   Hunger Vital Sign    Within the past 12 months, you worried that your food would run out before you got the money to buy more.: Sometimes true    Within the past 12 months, the food you bought just didn't last and you didn't have money to get more.: Sometimes true  Transportation Needs: No Transportation Needs (01/14/2024)   PRAPARE - Administrator, Civil Service (Medical): No    Lack of Transportation (Non-Medical): No  Physical Activity: Sufficiently Active  (12/21/2023)   Received from [facility]   Exercise Vital Sign    On average, how many days per week do you engage in moderate to strenuous exercise (like a brisk walk)?: 7 days    On average, how many minutes do you engage in exercise at this level?: 70 min  Stress: Stress Concern Present (12/21/2023)   Received from [facility] of Occupational [facility] - Occupational Stress Questionnaire    Feeling of Stress : Rather much  Social Connections: Unknown (01/14/2024)   Social Connection and Isolation Panel    Frequency of Communication with Friends and Family: Never    Frequency of Social Gatherings with Friends and Family: Never    Attends Religious Services: Never    Database administrator or Organizations: Yes    Attends Banker Meetings: Never    Marital Status: Patient declined  Catering manager Violence: Not At Risk (01/15/2024)   Humiliation, Afraid, Rape, and Kick questionnaire    Fear of Current or Ex-Partner: No    Emotionally Abused: No    Physically Abused: No    Sexually Abused: No     PHYSICAL EXAM  GENERAL EXAM/CONSTITUTIONAL: Vitals:  Vitals:   06/14/24 0941  BP: 122/76  Pulse: 75  SpO2: 95%  Weight: 146 lb 6.4 oz (66.4 kg)  Height: 5' 3 (1.6 m)   Body mass index is 25.93 kg/m. Wt Readings from Last 3 Encounters:  06/14/24 146 lb 6.4 oz (66.4 kg)  02/04/24 140 lb (63.5 kg)  01/19/24 142 lb (64.4 kg)   Patient is in no distress; well developed, nourished and groomed; neck is supple  CARDIOVASCULAR: Examination of carotid  arteries is normal; no carotid bruits Regular rate and rhythm, no murmurs Examination of peripheral vascular system by observation and palpation is normal  EYES: Ophthalmoscopic exam of optic discs and posterior segments is normal; no papilledema or hemorrhages No results found.  MUSCULOSKELETAL: Gait, strength, tone, movements noted in Neurologic exam below  NEUROLOGIC: MENTAL STATUS:      No data  to display         awake, alert, oriented to person, place and time recent and remote memory intact normal attention and concentration language fluent, comprehension intact, naming intact fund of knowledge appropriate  CRANIAL NERVE:  2nd - no papilledema on fundoscopic exam 2nd, 3rd, 4th, 6th - pupils equal and reactive to light, visual fields full to confrontation, extraocular muscles intact, no nystagmus 5th - facial sensation symmetric 7th - facial strength symmetric 8th - hearing intact 9th - palate elevates symmetrically, uvula midline 11th - shoulder shrug symmetric 12th - tongue protrusion midline  MOTOR:  normal bulk and tone, full strength in the BUE, BLE  SENSORY:  normal and symmetric to light touch, pinprick, temperature, vibration; EXCET DECR SENSATION IN RIGHT HAND   COORDINATION:  finger-nose-finger, fine finger movements normal  REFLEXES:  deep tendon reflexes 1+ and symmetric  GAIT/STATION:  narrow based gait     DIAGNOSTIC DATA (LABS, IMAGING, TESTING) - I reviewed patient records, labs, notes, testing and imaging myself where available.  Lab Results  Component Value Date   WBC 5.1 02/04/2024   HGB 13.5 02/04/2024   HCT 41.7 02/04/2024   MCV 98.8 02/04/2024   PLT 366 02/04/2024      Component Value Date/Time   NA 139 02/04/2024 1445   K 4.1 02/04/2024 1445   CL 106 02/04/2024 1445   CO2 27 02/04/2024 1445   GLUCOSE 86 02/04/2024 1445   BUN 16 02/04/2024 1445   CREATININE 0.67 02/04/2024 1445   CREATININE 0.68 09/20/2018 1123   CALCIUM 9.1 02/04/2024 1445   PROT 7.2 02/04/2024 1445   ALBUMIN 4.5 02/04/2024 1445   AST 19 02/04/2024 1445   ALT 21 02/04/2024 1445   ALKPHOS 48 02/04/2024 1445   BILITOT 0.3 02/04/2024 1445   GFRNONAA >60 02/04/2024 1445   GFRNONAA 93 09/20/2018 1123   GFRAA >60 04/04/2019 1220   GFRAA 108 09/20/2018 1123   No results found for: CHOL, HDL, LDLCALC, LDLDIRECT, TRIG, CHOLHDL No results  found for: HGBA1C No results found for: VITAMINB12 Lab Results  Component Value Date   TSH 3.295 06/15/2011    12/04/23 xray right forearm - Right distal radial fracture.    ASSESSMENT AND PLAN  68 y.o. year old female here with numbness in right hand following right wrist fracture, compression and splinting, ORIF surgery and then hematoma evacuation and right carpal tunnel release and fasciotomy surgeries.  Dx:  1. Numbness and tingling in right hand     PLAN:  POST-TRAUMATIC NUMBNESS IN RIGHT HAND (initial syncope and right wrist fracture in 12/03/23; then antebrachial bandaging / compression; then right ORIF and CTS surgery 12/24/23; then right CTS and fasciotomy on 01/14/24; now with persistent residual numbness in right hand that started at time of initial injury on 12/03/23) - continue OT exercises; symptoms have plateaued; likely represents mild post-traumatic sensory nerve damage at the wrist affecting median, ulnar and radial nerves; if worsening, then consider EMG/NCS in future, but not likely to change management at the current time.  Return for return to PCP, return to referring provider.    Shuaib Corsino  R. Arlys Scatena, MD 06/14/2024, 10:55 AM Certified in Neurology, Neurophysiology and Neuroimaging  [facility] [address], [address] [place], [place] [address] [phone]

## 2024-09-03 ENCOUNTER — Other Ambulatory Visit: Payer: Self-pay | Admitting: Hematology and Oncology

## 2024-09-03 DIAGNOSIS — Z86718 Personal history of other venous thrombosis and embolism: Secondary | ICD-10-CM

## 2024-09-03 NOTE — Progress Notes (Unsigned)
 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 Her initial DVT occurred in the left lower extremity in 2017, treated with Xarelto  for six months. Following a left knee replacement, she experienced another clot and resumed Xarelto , which was intended for lifelong use. However, she developed another clot while on Xarelto , leading to a switch to Eliquis . Despite adherence to Eliquis , she experienced another clot within the last six to eight months, prompting a switch to Lovenox  injections twice daily.   Interval History:  Erin Vasquez 68 y.o. female with medical history significant for recurrent thromboembolism for a virtual visit. The patient's last visit was on 03/03/2024. In the interim, she has had no major changes in her health.  On exam today Erin Vasquez reports she has been well overall in the interim since our last visit.  She reports that she has had no hospitalizations, ER visits, or new medications.  She is currently taking her Eliquis  as prescribed and tolerating it well with no bleeding, bruising, or dark stools.  She reports he is currently being either $0 or $11 as a co-pay.  She reports she unfortunate does still have some Lovenox  shots on standby which she was not able to use.  She is currently keeping them stored.  She reports that she had a wonderful trip recently to [place].  She notes that she does not have any signs or symptoms concerning for recurrent VTE such as leg pain, leg swelling, chest pain, or shortness of breath.  She reports she has no upcoming surgeries, dental work, or other procedures.  Overall she feels well and is  willing and able to continue on Eliquis  therapy at this time.  Full 10 point ROS is otherwise negative.  MEDICAL HISTORY:  Past Medical History:  Diagnosis Date   Anemia    during pregnancy   Anxiety    Arthritis    all over (07/07/2017)   Bilateral pulmonary embolism (HCC) 10/29/2015   from my left ankle   Childhood asthma    as child, nothing as adult   Chronic back pain    all over (07/07/2017)   DVT (deep venous thrombosis) (HCC) 10/29/2015   from my left ankle   GERD (gastroesophageal reflux disease)    tx. Tums   H/O hiatal hernia    repaired w/gastric bypass OR (07/07/2017)   History of esophageal dilatation    Hypothyroidism    Lupus anticoagulant disorder    Dr. Amadeo    Neuromuscular disorder [facility])    L thigh - Numbness, nerve damage     Peripheral vascular disease    Sacroiliac pain    left thigh has been numb since 01/2016 after appendectomy (07/07/2017)   Sleep apnea    07/07/2017 bariatric dr says I don't need CPAP anymore (07/07/2017)   Thyroid  disease    Uterine cancer (HCC) 01/2014   Varicose veins    Ventral hernia    Vitamin D  deficiency     SURGICAL HISTORY: Past Surgical History:  Procedure Laterality Date   ABDOMINAL HYSTERECTOMY  01/2014   robotic   APPENDECTOMY     ruptured 01/2016   BALLOON DILATION N/A 08/02/2013   Procedure: BALLOON  DILATION;  Surgeon: Elsie Cree, MD;  Location: [facility];  Service: Endoscopy;  Laterality: N/A;   BUNIONECTOMY WITH HAMMERTOE RECONSTRUCTION Left ~ 2005   CARPAL TUNNEL RELEASE Right 01/14/2024   Procedure: RIGHT CARPAL TUNNEL RELEASE;  Surgeon: Alyse Agent, MD;  Location: [facility];  Service: Orthopedics;  Laterality: Right;   COLONOSCOPY WITH PROPOFOL  N/A 08/02/2013   Procedure: COLONOSCOPY WITH PROPOFOL ;  Surgeon: Elsie Cree, MD;  Location: [facility];  Service: Endoscopy;  Laterality: N/A;   DILATION AND CURETTAGE OF UTERUS  x2   S/P miscarriage   ESOPHAGOGASTRODUODENOSCOPY  (EGD) WITH ESOPHAGEAL DILATION  ~ 2003   ESOPHAGOGASTRODUODENOSCOPY (EGD) WITH PROPOFOL  N/A 08/02/2013   Procedure: ESOPHAGOGASTRODUODENOSCOPY (EGD) WITH PROPOFOL ;  Surgeon: Elsie Cree, MD;  Location: [facility];  Service: Endoscopy;  Laterality: N/A;   FASCIECTOMY, UPPER EXTREMITY Right 01/14/2024   Procedure: FASCIECTOMY;  Surgeon: Alyse Agent, MD;  Location: [facility] OR;  Service: Orthopedics;  Laterality: Right;   HERNIA REPAIR  04/2017   VHR   INCISION AND DRAINAGE OF WOUND Right 01/14/2024   Procedure: RIGHT WRIST IRRIGATION AND DEBRIDEMENT;  Surgeon: Alyse Agent, MD;  Location: [facility];  Service: Orthopedics;  Laterality: Right;   INSERTION OF MESH N/A 04/28/2017   Procedure: POSSIBLE INSERTION OF MESH;  Surgeon: Vernetta Berg, MD;  Location: [facility];  Service: General;  Laterality: N/A;   JOINT REPLACEMENT     LAPAROSCOPIC APPENDECTOMY N/A 02/07/2016   Procedure: LAPAROSCOPIC APPENDECTOMY;  Surgeon: Berg Vernetta, MD;  Location: [facility];  Service: General;  Laterality: N/A;   OPEN REDUCTION INTERNAL FIXATION (ORIF) DISTAL RADIAL FRACTURE Right 12/28/2023   Procedure: OPEN REDUCTION INTERNAL FIXATION (ORIF) DISTAL RADIUS FRACTURE;  Surgeon: Beverley Evalene BIRCH, MD;  Location: [facility];  Service: Orthopedics;  Laterality: Right;   ROBOTIC ASSISTED TOTAL HYSTERECTOMY WITH BILATERAL SALPINGO OOPHERECTOMY  01/23/2014   uterine cancer   ROUX-EN-Y GASTRIC BYPASS  11/2016   [facility]   SHOULDER ARTHROSCOPY WITH DEBRIDEMENT AND BICEP TENDON REPAIR Left 09/11/2014   Procedure: SHOULDER ARTHROSCOPY WITH DEBRIDEMENT AND BICEP TENDON REPAIR;  Surgeon: Evalene BIRCH Beverley, MD;  Location: [facility];  Service: Orthopedics;  Laterality: Left;   SHOULDER ARTHROSCOPY WITH DISTAL CLAVICLE RESECTION Left 09/11/2014   Procedure: SHOULDER ARTHROSCOPY WITH DISTAL CLAVICLE RESECTION;  Surgeon: Evalene BIRCH Beverley, MD;  Location[facility];  Service: Orthopedics;  Laterality: Left;   SHOULDER ARTHROSCOPY WITH SUBACROMIAL  DECOMPRESSION Left 09/11/2014   Procedure: SHOULDER ARTHROSCOPY WITH SUBACROMIAL DECOMPRESSION;  Surgeon: Evalene BIRCH Beverley, MD;  Location[facility];  Service: Orthopedics;  Laterality: Left;   TONSILLECTOMY     TOTAL KNEE ARTHROPLASTY Right 07/06/2017   TOTAL KNEE ARTHROPLASTY Right 07/06/2017   Procedure: RIGHT TOTAL KNEE ARTHROPLASTY;  Surgeon: Beverley Evalene BIRCH, MD;  Location: [facility] OR;  Service: Orthopedics;  Laterality: Right;   TOTAL KNEE ARTHROPLASTY Left 04/19/2018   Procedure: LEFT TOTAL KNEE ARTHROPLASTY;  Surgeon: Beverley Evalene BIRCH, MD;  Location: [facility] OR;  Service: Orthopedics;  Laterality: Left;   TOTAL SHOULDER ARTHROPLASTY Left 04/04/2019   Procedure: TOTAL SHOULDER ARTHROPLASTY reverse;  Surgeon: Beverley Evalene BIRCH, MD;  [facility];  Service: Orthopedics;  Laterality: Left;   VENTRAL HERNIA REPAIR N/A 04/28/2017   Procedure: HERNIA REPAIR VENTRAL ADULT;  Surgeon: Vernetta Berg, MD;  Location: [facility] OR;  Service: General;  Laterality: N/A;    SOCIAL HISTORY: Social History   Socioeconomic History   Marital status: Divorced    Spouse name: Not on file   Number of children: Not on  file   Years of education: Not on file   Highest education level: Not on file  Occupational History   Not on file  Tobacco Use   Smoking status: Never   Smokeless tobacco: Former    Types: Chew   Tobacco comments:    chewed as a teen  Vaping Use   Vaping status: Never Used  Substance and Sexual Activity   Alcohol use: Never   Drug use: No   Sexual activity: Yes    Birth control/protection: None  Other Topics Concern   Not on file  Social History Narrative   1 diet pepsi when she goes to a restaurant (3 times a week)    Social Drivers of Health   Tobacco Use: Low Risk (07/04/2024)   Received from [facility]   Patient History    Smoking Tobacco Use: Never    Smokeless Tobacco Use: Never    Passive Exposure: Not on file  Recent Concern: Tobacco Use - Medium Risk (06/15/2024)    Received from [facility]   Patient History    Smoking Tobacco Use: Never    Smokeless Tobacco Use: Former    Passive Exposure: Not on Actuary Strain: High Risk (07/02/2024)   Received from [facility]   Overall Financial Resource Strain (CARDIA)    How hard is it for you to pay for the very basics like food, housing, medical care, and heating?: Hard  Food Insecurity: Food Insecurity Present (07/02/2024)   Received from [facility]   Epic    Within the past 12 months, you worried that your food would run out before you got the money to buy more.: Never true    Within the past 12 months, the food you bought just didn't last and you didn't have money to get more.: Sometimes true  Transportation Needs: No Transportation Needs (07/02/2024)   Received from [facility]    In the past 12 months, has lack of transportation kept you from medical appointments or from getting medications?: No    In the past 12 months, has lack of transportation kept you from meetings, work, or from getting things needed for daily living?: No  Physical Activity: Sufficiently Active (07/02/2024)   Received from [facility]   Exercise Vital Sign    On average, how many days per week do you engage in moderate to strenuous exercise (like a brisk walk)?: 5 days    On average, how many minutes do you engage in exercise at this level?: 30 min  Stress: No Stress Concern Present (07/02/2024)   Received from [facility] - Occupational Stress Questionnaire    Do you feel stress - tense, restless, nervous, or anxious, or unable to sleep at night because your mind is troubled all the time - these days?: Not at all  Social Connections: Socially Integrated (07/02/2024)   Received from [facility]   Social Network    How would you rate your social network (family, work, friends)?: Good participation with social networks  Intimate Partner Violence:  Not At Risk (07/02/2024)   Received from [facility]   HITS    Over the last 12 months how often did your partner physically hurt you?: Never    Over the last 12 months how often did your partner insult you or talk down to you?: Never    Over the last 12 months how often did your partner threaten you  with physical harm?: Never    Over the last 12 months how often did your partner scream or curse at you?: Never  Depression (PHQ2-9): Not on file  Alcohol Screen: Not on file  Housing: High Risk (07/02/2024)   Received from [facility]    In the last 12 months, was there a time when you were not able to pay the mortgage or rent on time?: Yes    In the past 12 months, how many times have you moved where you were living?: 0    At any time in the past 12 months, were you homeless or living in a shelter (including now)?: No  Utilities: At Risk (07/02/2024)   Received from [facility]    In the past 12 months has the electric, gas, oil, or water  company threatened to shut off services in your home?: Yes  Health Literacy: Not on file    FAMILY HISTORY: Family History  Problem Relation Age of Onset   Stroke Mother    Transient ischemic attack Mother    Heart attack Father    Migraines Sister    Lung cancer Maternal Grandmother    Diabetes type II Other     ALLERGIES:  is allergic to bee venom, hymenoptera venom preparations, other, penicillins, latex, morphine  and codeine, plastibase, and tangerine flavoring agent (non-screening).  MEDICATIONS:  Current Outpatient Medications  Medication Sig Dispense Refill   apixaban  (ELIQUIS ) 5 MG TABS tablet Take 1 tablet (5 mg total) by mouth 2 (two) times daily. 60 tablet 5   Calcium Carbonate-Vitamin D  (CALTRATE 600+D PO) Take 1 tablet by mouth daily.     Cholecalciferol  50 MCG (2000 UT) TABS Take 100 mcg by mouth.     ergocalciferol  (VITAMIN D2) 1.25 MG (50000 UT) capsule Take 50,000 Units by mouth once a week.     famotidine   (PEPCID ) 20 MG tablet Take 20 mg by mouth daily.      famotidine  (PEPCID ) 20 MG tablet Take 20 mg by mouth daily.     levothyroxine  (SYNTHROID , LEVOTHROID) 50 MCG tablet Take 50 mcg by mouth daily before breakfast.      Multiple Vitamin (MULTIVITAMIN WITH MINERALS) TABS tablet Take 1 tablet by mouth 2 (two) times daily.      omeprazole (PRILOSEC) 40 MG capsule Take 40 mg by mouth every evening.      omeprazole (PRILOSEC) 40 MG capsule Take 40 mg by mouth daily.     ondansetron  (ZOFRAN -ODT) 4 MG disintegrating tablet Take 1 tablet (4 mg total) by mouth every 8 (eight) hours as needed for nausea or vomiting. (Patient not taking: Reported on 06/14/2024) 15 tablet 0   oxybutynin (DITROPAN-XL) 5 MG 24 hr tablet Take 5 mg by mouth daily.     oxyCODONE  (ROXICODONE ) 5 MG immediate release tablet Take 1 tablet (5 mg total) by mouth every 6 (six) hours as needed. (Patient not taking: Reported on 06/14/2024) 15 tablet 0   pseudoephedrine (SUDAFED) 120 MG 12 hr tablet Take 120 mg by mouth every 12 (twelve) hours as needed for congestion. (Patient taking differently: Take 120 mg by mouth every 12 (twelve) hours as needed for congestion. As needed)     No current facility-administered medications for this visit.    REVIEW OF SYSTEMS:   Constitutional: ( - ) fevers, ( - )  chills , ( - ) night sweats Eyes: ( - ) blurriness of vision, ( - ) double vision, ( - ) watery  eyes Ears, nose, mouth, throat, and face: ( - ) mucositis, ( - ) sore throat Respiratory: ( - ) cough, ( - ) dyspnea, ( - ) wheezes Cardiovascular: ( - ) palpitation, ( - ) chest discomfort, ( - ) lower extremity swelling Gastrointestinal:  ( - ) nausea, ( - ) heartburn, ( - ) change in bowel habits Skin: ( - ) abnormal skin rashes Lymphatics: ( - ) new lymphadenopathy, ( - ) easy bruising Neurological: ( - ) numbness, ( - ) tingling, ( - ) new weaknesses Behavioral/Psych: ( - ) mood change, ( - ) new changes  All other systems were reviewed  with the patient and are negative.  PHYSICAL EXAMINATION: Not performed due to virtual visit.   LABORATORY DATA:  I have reviewed the data as listed    Latest Ref Rng & Units 09/04/2024    9:45 AM 02/04/2024    2:45 PM 01/19/2024   10:56 AM  CBC  WBC 4.0 - 10.5 K/uL 5.5  5.1  4.9   Hemoglobin 12.0 - 15.0 g/dL 86.7  86.4  86.8   Hematocrit 36.0 - 46.0 % 39.7  41.7  40.2   Platelets 150 - 400 K/uL 252  366  262        Latest Ref Rng & Units 09/04/2024    9:45 AM 02/04/2024    2:45 PM 01/19/2024   10:56 AM  CMP  Glucose 70 - 99 mg/dL 84  86  81   BUN 8 - 23 mg/dL 20  16  16    Creatinine 0.44 - 1.00 mg/dL 9.34  9.32  9.27   Sodium 135 - 145 mmol/L 141  139  141   Potassium 3.5 - 5.1 mmol/L 4.0  4.1  3.7   Chloride 98 - 111 mmol/L 108  106  106   CO2 22 - 32 mmol/L 22  27  27    Calcium 8.9 - 10.3 mg/dL 8.4  9.1  9.1   Total Protein 6.5 - 8.1 g/dL 6.7  7.2  7.1   Total Bilirubin 0.0 - 1.2 mg/dL 0.5  0.3  0.5   Alkaline Phos 38 - 126 U/L 48  48  49   AST 15 - 41 U/L 24  19  28    ALT 0 - 44 U/L 28  21  29     RADIOGRAPHIC STUDIES: I have personally reviewed the radiological images as listed and agreed with the findings in the report. No results found.   ASSESSMENT & PLAN Erin Vasquez is a 68 y.o. female with medical history significant for recurrent thromboembolism.   # Recurrent VTE # Failure of Eliquis /Xarelto  -- Continue Lovenox  injections 1 mg/kg twice daily subcutaneous -- Evaluated by Dr. Garnette Parents from [facility] on 02/29/2024. Per review of his note, he agrees on long term anticoagulation. Sine there is question of true failure of DOAC therapy, offered options including continuing on Lovenox  therapy versus switching to fondaparinux or Eliquis . Patient agreed to switching back to Eliquis  therapy.  PLAN: --Continue on Eliquis  5 mg twice daily. --labs today show white blood cell 5.5, hemoglobin 13.2, MCV 91.3, platelets 252.  Creatinine LFTs within normal limits.   --Appreciate recommendations from Elia Parents at [facility] --RTC in 6 months with repeat labs.   # Positive Lupus Anticoagulant Panel -- Very low clinical suspicion that this represents a genuine lupus anticoagulant.  It appears this was drawn on anticoagulation therapy. -- Antibodies have been negative.  # H/O Anemia due to  blood loss #H/O Hematoma of left leg --Severe anemia due to bleeding from a hematoma in the left leg, with hemoglobin dropping from 12 to 4, requiring 7 units of blood transfusion. Anemia likely secondary to anticoagulation therapy. --Hematoma in the left leg leading to significant blood loss and hospitalization, likely related to anticoagulation therapy.   # History of uterine cancer --Uterine cancer with total hysterectomy. No chemotherapy or radiation required. Currently no active issues related to uterine cancer.  No orders of the defined types were placed in this encounter.   All questions were answered. The patient knows to call the clinic with any problems, questions or concerns.  A total of more than 25 minutes were spent on this encounter with  non-face-to-face time, including preparing to see the patient, ordering tests and/or medications, counseling the patient and coordination of care as outlined above.   Norleen IVAR Kidney, MD Department of Hematology/Oncology [facility] Phone: [phone] Pager: [phone] Email: [email or web address]    09/04/2024 2:42 PM  "

## 2024-09-04 ENCOUNTER — Inpatient Hospital Stay: Admitting: Hematology and Oncology

## 2024-09-04 ENCOUNTER — Inpatient Hospital Stay: Attending: Hematology and Oncology

## 2024-09-04 VITALS — BP 136/84 | HR 54 | Temp 97.9°F | Resp 17 | Ht 63.0 in | Wt 146.0 lb

## 2024-09-04 DIAGNOSIS — Z86718 Personal history of other venous thrombosis and embolism: Secondary | ICD-10-CM | POA: Insufficient documentation

## 2024-09-04 DIAGNOSIS — I2782 Chronic pulmonary embolism: Secondary | ICD-10-CM

## 2024-09-04 DIAGNOSIS — Z7901 Long term (current) use of anticoagulants: Secondary | ICD-10-CM | POA: Insufficient documentation

## 2024-09-04 DIAGNOSIS — D6862 Lupus anticoagulant syndrome: Secondary | ICD-10-CM | POA: Diagnosis present

## 2024-09-04 LAB — CBC WITH DIFFERENTIAL (CANCER CENTER ONLY)
Abs Immature Granulocytes: 0.01 K/uL (ref 0.00–0.07)
Basophils Absolute: 0.1 K/uL (ref 0.0–0.1)
Basophils Relative: 1 %
Eosinophils Absolute: 0.1 K/uL (ref 0.0–0.5)
Eosinophils Relative: 2 %
HCT: 39.7 % (ref 36.0–46.0)
Hemoglobin: 13.2 g/dL (ref 12.0–15.0)
Immature Granulocytes: 0 %
Lymphocytes Relative: 21 %
Lymphs Abs: 1.2 K/uL (ref 0.7–4.0)
MCH: 30.3 pg (ref 26.0–34.0)
MCHC: 33.2 g/dL (ref 30.0–36.0)
MCV: 91.3 fL (ref 80.0–100.0)
Monocytes Absolute: 0.4 K/uL (ref 0.1–1.0)
Monocytes Relative: 7 %
Neutro Abs: 3.8 K/uL (ref 1.7–7.7)
Neutrophils Relative %: 69 %
Platelet Count: 252 K/uL (ref 150–400)
RBC: 4.35 MIL/uL (ref 3.87–5.11)
RDW: 12.7 % (ref 11.5–15.5)
WBC Count: 5.5 K/uL (ref 4.0–10.5)
nRBC: 0 % (ref 0.0–0.2)

## 2024-09-04 LAB — CMP (CANCER CENTER ONLY)
ALT: 28 U/L (ref 0–44)
AST: 24 U/L (ref 15–41)
Albumin: 4.4 g/dL (ref 3.5–5.0)
Alkaline Phosphatase: 48 U/L (ref 38–126)
Anion gap: 11 (ref 5–15)
BUN: 20 mg/dL (ref 8–23)
CO2: 22 mmol/L (ref 22–32)
Calcium: 8.4 mg/dL — ABNORMAL LOW (ref 8.9–10.3)
Chloride: 108 mmol/L (ref 98–111)
Creatinine: 0.65 mg/dL (ref 0.44–1.00)
GFR, Estimated: >60 mL/min
Glucose, Bld: 84 mg/dL (ref 70–99)
Potassium: 4 mmol/L (ref 3.5–5.1)
Sodium: 141 mmol/L (ref 135–145)
Total Bilirubin: 0.5 mg/dL (ref 0.0–1.2)
Total Protein: 6.7 g/dL (ref 6.5–8.1)

## 2024-09-04 MED ORDER — APIXABAN 5 MG PO TABS: 5.0000 mg | ORAL_TABLET | Freq: Two times a day (BID) | ORAL | 5 refills | Status: AC

## 2024-10-04 ENCOUNTER — Telehealth: Payer: Self-pay | Admitting: Podiatry

## 2024-10-04 NOTE — Telephone Encounter (Signed)
 cld to let pt know MR were recd for her. She is out of town and said to mail. She vrf addr on file.  Pt was driving but vrf add to mail

## 2025-03-05 ENCOUNTER — Inpatient Hospital Stay

## 2025-03-05 ENCOUNTER — Inpatient Hospital Stay: Admitting: Hematology and Oncology
